# Patient Record
Sex: Female | Born: 1935 | Race: White | Hispanic: No | Smoking: Never smoker
Health system: Southern US, Community
[De-identification: ages and names within clinical notes are randomized; demographics above are authoritative.]

## PROBLEM LIST (undated history)

## (undated) DIAGNOSIS — N393 Stress incontinence (female) (male): Secondary | ICD-10-CM

## (undated) DIAGNOSIS — S7290XA Unspecified fracture of unspecified femur, initial encounter for closed fracture: Secondary | ICD-10-CM

## (undated) DIAGNOSIS — R112 Nausea with vomiting, unspecified: Secondary | ICD-10-CM

## (undated) DIAGNOSIS — E559 Vitamin D deficiency, unspecified: Secondary | ICD-10-CM

## (undated) DIAGNOSIS — K219 Gastro-esophageal reflux disease without esophagitis: Secondary | ICD-10-CM

## (undated) DIAGNOSIS — I4891 Unspecified atrial fibrillation: Secondary | ICD-10-CM

## (undated) DIAGNOSIS — Z9889 Other specified postprocedural states: Secondary | ICD-10-CM

## (undated) DIAGNOSIS — I890 Lymphedema, not elsewhere classified: Secondary | ICD-10-CM

## (undated) DIAGNOSIS — K5792 Diverticulitis of intestine, part unspecified, without perforation or abscess without bleeding: Secondary | ICD-10-CM

## (undated) DIAGNOSIS — I471 Supraventricular tachycardia: Secondary | ICD-10-CM

## (undated) DIAGNOSIS — I4719 Other supraventricular tachycardia: Secondary | ICD-10-CM

## (undated) DIAGNOSIS — I10 Essential (primary) hypertension: Secondary | ICD-10-CM

## (undated) DIAGNOSIS — M1990 Unspecified osteoarthritis, unspecified site: Secondary | ICD-10-CM

## (undated) DIAGNOSIS — C801 Malignant (primary) neoplasm, unspecified: Secondary | ICD-10-CM

## (undated) DIAGNOSIS — C7A090 Malignant carcinoid tumor of the bronchus and lung: Secondary | ICD-10-CM

## (undated) DIAGNOSIS — M810 Age-related osteoporosis without current pathological fracture: Secondary | ICD-10-CM

## (undated) DIAGNOSIS — K649 Unspecified hemorrhoids: Secondary | ICD-10-CM

## (undated) HISTORY — DX: Stress incontinence (female) (male): N39.3

## (undated) HISTORY — PX: TONSILLECTOMY: SUR1361

## (undated) HISTORY — DX: Other supraventricular tachycardia: I47.19

## (undated) HISTORY — DX: Supraventricular tachycardia: I47.1

## (undated) HISTORY — DX: Vitamin D deficiency, unspecified: E55.9

## (undated) HISTORY — PX: OTHER SURGICAL HISTORY: SHX169

## (undated) HISTORY — PX: APPENDECTOMY: SHX54

## (undated) HISTORY — DX: Diverticulitis of intestine, part unspecified, without perforation or abscess without bleeding: K57.92

## (undated) HISTORY — DX: Essential (primary) hypertension: I10

## (undated) HISTORY — PX: COLONOSCOPY W/ POLYPECTOMY: SHX1380

## (undated) HISTORY — DX: Unspecified osteoarthritis, unspecified site: M19.90

## (undated) HISTORY — PX: COLONOSCOPY: SHX174

## (undated) HISTORY — DX: Unspecified hemorrhoids: K64.9

## (undated) HISTORY — DX: Malignant (primary) neoplasm, unspecified: C80.1

## (undated) HISTORY — DX: Malignant carcinoid tumor of the bronchus and lung: C7A.090

## (undated) HISTORY — PX: JOINT REPLACEMENT: SHX530

## (undated) HISTORY — DX: Unspecified fracture of unspecified femur, initial encounter for closed fracture: S72.90XA

## (undated) HISTORY — PX: TOTAL HIP ARTHROPLASTY: SHX124

## (undated) HISTORY — PX: EYE SURGERY: SHX253

---

## 1968-10-15 HISTORY — PX: TUBAL LIGATION: SHX77

## 1975-10-16 HISTORY — PX: VAGINAL HYSTERECTOMY: SHX2639

## 1986-10-15 DIAGNOSIS — C801 Malignant (primary) neoplasm, unspecified: Secondary | ICD-10-CM

## 1986-10-15 HISTORY — DX: Malignant (primary) neoplasm, unspecified: C80.1

## 1986-10-15 HISTORY — PX: BUNIONECTOMY: SHX129

## 1987-04-05 HISTORY — PX: MASTECTOMY PARTIAL / LUMPECTOMY W/ AXILLARY LYMPHADENECTOMY: SUR852

## 1987-05-04 HISTORY — PX: BREAST BIOPSY: SHX20

## 1989-10-15 DIAGNOSIS — K649 Unspecified hemorrhoids: Secondary | ICD-10-CM

## 1989-10-15 HISTORY — DX: Unspecified hemorrhoids: K64.9

## 1994-10-15 DIAGNOSIS — M1990 Unspecified osteoarthritis, unspecified site: Secondary | ICD-10-CM

## 1994-10-15 HISTORY — DX: Unspecified osteoarthritis, unspecified site: M19.90

## 1997-10-15 DIAGNOSIS — K5792 Diverticulitis of intestine, part unspecified, without perforation or abscess without bleeding: Secondary | ICD-10-CM

## 1997-10-15 HISTORY — DX: Diverticulitis of intestine, part unspecified, without perforation or abscess without bleeding: K57.92

## 1998-05-12 ENCOUNTER — Ambulatory Visit (HOSPITAL_COMMUNITY): Admission: RE | Admit: 1998-05-12 | Discharge: 1998-05-12 | Payer: Self-pay | Admitting: Gastroenterology

## 1998-09-07 ENCOUNTER — Ambulatory Visit (HOSPITAL_COMMUNITY): Admission: RE | Admit: 1998-09-07 | Discharge: 1998-09-07 | Payer: Self-pay

## 1998-09-13 ENCOUNTER — Ambulatory Visit (HOSPITAL_COMMUNITY): Admission: RE | Admit: 1998-09-13 | Discharge: 1998-09-13 | Payer: Self-pay

## 1999-12-01 ENCOUNTER — Other Ambulatory Visit: Admission: RE | Admit: 1999-12-01 | Discharge: 1999-12-01 | Payer: Self-pay | Admitting: *Deleted

## 2001-02-06 ENCOUNTER — Encounter: Admission: RE | Admit: 2001-02-06 | Discharge: 2001-02-06 | Payer: Self-pay

## 2001-03-17 ENCOUNTER — Encounter: Payer: Self-pay | Admitting: Orthopedic Surgery

## 2001-03-19 ENCOUNTER — Inpatient Hospital Stay (HOSPITAL_COMMUNITY): Admission: RE | Admit: 2001-03-19 | Discharge: 2001-03-24 | Payer: Self-pay | Admitting: Orthopedic Surgery

## 2001-03-19 ENCOUNTER — Encounter: Payer: Self-pay | Admitting: Orthopedic Surgery

## 2002-03-05 ENCOUNTER — Encounter: Payer: Self-pay | Admitting: Oncology

## 2002-03-05 ENCOUNTER — Ambulatory Visit (HOSPITAL_COMMUNITY): Admission: RE | Admit: 2002-03-05 | Discharge: 2002-03-05 | Payer: Self-pay | Admitting: Oncology

## 2003-05-07 ENCOUNTER — Observation Stay (HOSPITAL_COMMUNITY): Admission: EM | Admit: 2003-05-07 | Discharge: 2003-05-09 | Payer: Self-pay | Admitting: *Deleted

## 2003-05-07 ENCOUNTER — Encounter: Payer: Self-pay | Admitting: *Deleted

## 2003-05-09 ENCOUNTER — Encounter: Payer: Self-pay | Admitting: *Deleted

## 2004-01-26 ENCOUNTER — Encounter: Admission: RE | Admit: 2004-01-26 | Discharge: 2004-01-26 | Payer: Self-pay | Admitting: Orthopedic Surgery

## 2004-09-06 ENCOUNTER — Ambulatory Visit: Payer: Self-pay | Admitting: Oncology

## 2005-01-12 ENCOUNTER — Ambulatory Visit: Payer: Self-pay | Admitting: Family Medicine

## 2005-01-12 ENCOUNTER — Ambulatory Visit: Payer: Self-pay | Admitting: Oncology

## 2005-08-18 ENCOUNTER — Encounter: Admission: RE | Admit: 2005-08-18 | Discharge: 2005-08-18 | Payer: Self-pay | Admitting: Orthopedic Surgery

## 2005-09-05 ENCOUNTER — Encounter: Admission: RE | Admit: 2005-09-05 | Discharge: 2005-09-05 | Payer: Self-pay | Admitting: Orthopedic Surgery

## 2005-09-20 ENCOUNTER — Encounter: Admission: RE | Admit: 2005-09-20 | Discharge: 2005-09-20 | Payer: Self-pay | Admitting: Orthopedic Surgery

## 2005-10-16 ENCOUNTER — Ambulatory Visit: Payer: Self-pay | Admitting: Family Medicine

## 2005-10-26 ENCOUNTER — Ambulatory Visit: Payer: Self-pay | Admitting: Family Medicine

## 2006-01-10 ENCOUNTER — Ambulatory Visit: Payer: Self-pay | Admitting: Family Medicine

## 2006-01-11 ENCOUNTER — Ambulatory Visit: Payer: Self-pay | Admitting: Oncology

## 2006-03-25 ENCOUNTER — Ambulatory Visit: Payer: Self-pay | Admitting: Family Medicine

## 2006-04-09 ENCOUNTER — Ambulatory Visit: Payer: Self-pay | Admitting: Family Medicine

## 2006-07-02 ENCOUNTER — Ambulatory Visit: Payer: Self-pay | Admitting: Family Medicine

## 2007-01-13 ENCOUNTER — Encounter: Admission: RE | Admit: 2007-01-13 | Discharge: 2007-01-13 | Payer: Self-pay | Admitting: General Surgery

## 2007-01-13 DIAGNOSIS — M949 Disorder of cartilage, unspecified: Secondary | ICD-10-CM

## 2007-01-13 DIAGNOSIS — M1990 Unspecified osteoarthritis, unspecified site: Secondary | ICD-10-CM | POA: Insufficient documentation

## 2007-01-13 DIAGNOSIS — M899 Disorder of bone, unspecified: Secondary | ICD-10-CM | POA: Insufficient documentation

## 2007-04-09 ENCOUNTER — Ambulatory Visit: Payer: Self-pay | Admitting: Family Medicine

## 2007-04-10 ENCOUNTER — Encounter: Payer: Self-pay | Admitting: Family Medicine

## 2007-04-10 LAB — CONVERTED CEMR LAB
ALT: 17 units/L (ref 0–35)
AST: 20 units/L (ref 0–37)
Albumin: 4.2 g/dL (ref 3.5–5.2)
Alkaline Phosphatase: 48 units/L (ref 39–117)
BUN: 10 mg/dL (ref 6–23)
Basophils Absolute: 0 10*3/uL (ref 0.0–0.1)
Basophils Relative: 0.3 % (ref 0.0–1.0)
Bilirubin, Direct: 0.1 mg/dL (ref 0.0–0.3)
CO2: 31 meq/L (ref 19–32)
Calcium: 9.8 mg/dL (ref 8.4–10.5)
Chloride: 99 meq/L (ref 96–112)
Cholesterol: 201 mg/dL (ref 0–200)
Creatinine, Ser: 0.8 mg/dL (ref 0.4–1.2)
Direct LDL: 107.8 mg/dL
Eosinophils Absolute: 0 10*3/uL (ref 0.0–0.6)
Eosinophils Relative: 0.5 % (ref 0.0–5.0)
GFR calc Af Amer: 91 mL/min
GFR calc non Af Amer: 75 mL/min
Glucose, Bld: 85 mg/dL (ref 70–99)
HCT: 40.5 % (ref 36.0–46.0)
HDL: 65.9 mg/dL (ref >39.0)
Hemoglobin: 13.9 g/dL (ref 12.0–15.0)
Lymphocytes Relative: 17.6 % (ref 12.0–46.0)
MCHC: 34.3 g/dL (ref 30.0–36.0)
MCV: 94.7 fL (ref 78.0–100.0)
Monocytes Absolute: 0.3 10*3/uL (ref 0.2–0.7)
Monocytes Relative: 8.6 % (ref 3.0–11.0)
Neutro Abs: 2.2 10*3/uL (ref 1.4–7.7)
Neutrophils Relative %: 73 % (ref 43.0–77.0)
Platelets: 184 10*3/uL (ref 150–400)
Potassium: 4.2 meq/L (ref 3.5–5.1)
RBC: 4.27 M/uL (ref 3.87–5.11)
RDW: 13.4 % (ref 11.5–14.6)
Sodium: 140 meq/L (ref 135–145)
TSH: 1.06 microintl units/mL (ref 0.35–5.50)
Total Bilirubin: 1.4 mg/dL — ABNORMAL HIGH (ref 0.3–1.2)
Total CHOL/HDL Ratio: 3.1
Total Protein: 7.1 g/dL (ref 6.0–8.3)
Triglycerides: 50 mg/dL (ref 0–149)
VLDL: 10 mg/dL (ref 0–40)
WBC: 3 10*3/uL — ABNORMAL LOW (ref 4.5–10.5)

## 2007-04-11 ENCOUNTER — Encounter (INDEPENDENT_AMBULATORY_CARE_PROVIDER_SITE_OTHER): Payer: Self-pay | Admitting: *Deleted

## 2007-04-23 ENCOUNTER — Ambulatory Visit: Payer: Self-pay | Admitting: Family Medicine

## 2007-05-19 ENCOUNTER — Encounter: Payer: Self-pay | Admitting: Family Medicine

## 2007-06-11 ENCOUNTER — Telehealth: Payer: Self-pay | Admitting: Family Medicine

## 2007-08-18 ENCOUNTER — Telehealth (INDEPENDENT_AMBULATORY_CARE_PROVIDER_SITE_OTHER): Payer: Self-pay | Admitting: *Deleted

## 2007-08-21 ENCOUNTER — Encounter: Admission: RE | Admit: 2007-08-21 | Discharge: 2007-08-21 | Payer: Self-pay | Admitting: Family Medicine

## 2007-12-29 ENCOUNTER — Encounter: Admission: RE | Admit: 2007-12-29 | Discharge: 2007-12-29 | Payer: Self-pay | Admitting: Orthopedic Surgery

## 2008-05-04 ENCOUNTER — Telehealth (INDEPENDENT_AMBULATORY_CARE_PROVIDER_SITE_OTHER): Payer: Self-pay | Admitting: *Deleted

## 2009-02-07 ENCOUNTER — Encounter: Admission: RE | Admit: 2009-02-07 | Discharge: 2009-04-14 | Payer: Self-pay | Admitting: Family Medicine

## 2010-03-20 ENCOUNTER — Encounter: Admission: RE | Admit: 2010-03-20 | Discharge: 2010-03-20 | Payer: Self-pay | Admitting: Surgery

## 2010-06-05 ENCOUNTER — Ambulatory Visit (HOSPITAL_COMMUNITY): Admission: RE | Admit: 2010-06-05 | Discharge: 2010-06-06 | Payer: Self-pay | Admitting: Plastic Surgery

## 2010-06-05 ENCOUNTER — Encounter (INDEPENDENT_AMBULATORY_CARE_PROVIDER_SITE_OTHER): Payer: Self-pay | Admitting: Plastic Surgery

## 2010-06-05 HISTORY — PX: BREAST IMPLANT REMOVAL: SHX5361

## 2010-10-15 HISTORY — PX: KNEE ARTHROPLASTY: SHX992

## 2010-10-20 ENCOUNTER — Encounter: Payer: Self-pay | Admitting: Cardiology

## 2010-10-20 ENCOUNTER — Ambulatory Visit: Payer: Self-pay | Admitting: Cardiology

## 2010-10-20 ENCOUNTER — Ambulatory Visit (HOSPITAL_COMMUNITY)
Admission: RE | Admit: 2010-10-20 | Discharge: 2010-10-20 | Payer: Self-pay | Source: Home / Self Care | Attending: Cardiology | Admitting: Cardiology

## 2010-10-20 ENCOUNTER — Encounter: Payer: Self-pay | Admitting: Internal Medicine

## 2010-10-20 ENCOUNTER — Ambulatory Visit: Admission: RE | Admit: 2010-10-20 | Discharge: 2010-10-20 | Payer: Self-pay | Source: Home / Self Care

## 2010-11-04 ENCOUNTER — Encounter: Payer: Self-pay | Admitting: Orthopedic Surgery

## 2010-11-08 LAB — URINALYSIS, ROUTINE W REFLEX MICROSCOPIC
Bilirubin Urine: NEGATIVE
Hgb urine dipstick: NEGATIVE
Ketones, ur: NEGATIVE mg/dL
Nitrite: NEGATIVE
Protein, ur: NEGATIVE mg/dL
Specific Gravity, Urine: 1.007 (ref 1.005–1.030)
Urine Glucose, Fasting: NEGATIVE mg/dL
Urobilinogen, UA: 0.2 mg/dL (ref 0.0–1.0)
pH: 6 (ref 5.0–8.0)

## 2010-11-08 LAB — COMPREHENSIVE METABOLIC PANEL
ALT: 33 U/L (ref 0–35)
AST: 29 U/L (ref 0–37)
Alkaline Phosphatase: 74 U/L (ref 39–117)
BUN: 9 mg/dL (ref 6–23)
CO2: 27 mEq/L (ref 19–32)
Calcium: 9.8 mg/dL (ref 8.4–10.5)
Creatinine, Ser: 0.78 mg/dL (ref 0.4–1.2)
GFR calc Af Amer: >60 mL/min (ref >60)
GFR calc non Af Amer: >60 mL/min (ref >60)
Glucose, Bld: 88 mg/dL (ref 70–99)
Potassium: 4.5 mEq/L (ref 3.5–5.1)
Sodium: 136 mEq/L (ref 135–145)
Total Bilirubin: 1.5 mg/dL — ABNORMAL HIGH (ref 0.3–1.2)
Total Protein: 7.2 g/dL (ref 6.0–8.3)

## 2010-11-08 LAB — CBC
HCT: 44.1 % (ref 36.0–46.0)
MCH: 30.5 pg (ref 26.0–34.0)
MCHC: 33.1 g/dL (ref 30.0–36.0)
RDW: 14.1 % (ref 11.5–15.5)
WBC: 4.7 10*3/uL (ref 4.0–10.5)

## 2010-11-08 LAB — SURGICAL PCR SCREEN: Staphylococcus aureus: NEGATIVE

## 2010-11-08 LAB — PROTIME-INR
INR: 1.09 (ref 0.00–1.49)
Prothrombin Time: 14.3 seconds (ref 11.6–15.2)

## 2010-11-13 ENCOUNTER — Inpatient Hospital Stay (HOSPITAL_COMMUNITY)
Admission: RE | Admit: 2010-11-13 | Discharge: 2010-11-16 | DRG: 470 | Disposition: A | Discharge status: Skilled Nursing Facility | Payer: Medicare Other | Attending: Orthopedic Surgery | Admitting: Orthopedic Surgery

## 2010-11-13 DIAGNOSIS — Z79899 Other long term (current) drug therapy: Secondary | ICD-10-CM

## 2010-11-13 DIAGNOSIS — D62 Acute posthemorrhagic anemia: Secondary | ICD-10-CM | POA: Diagnosis not present

## 2010-11-13 DIAGNOSIS — M1710 Unilateral primary osteoarthritis, unspecified knee: Principal | ICD-10-CM | POA: Diagnosis present

## 2010-11-13 DIAGNOSIS — E871 Hypo-osmolality and hyponatremia: Secondary | ICD-10-CM | POA: Diagnosis not present

## 2010-11-13 DIAGNOSIS — Z7982 Long term (current) use of aspirin: Secondary | ICD-10-CM

## 2010-11-13 DIAGNOSIS — I4891 Unspecified atrial fibrillation: Secondary | ICD-10-CM | POA: Diagnosis present

## 2010-11-13 DIAGNOSIS — I10 Essential (primary) hypertension: Secondary | ICD-10-CM | POA: Diagnosis present

## 2010-11-13 DIAGNOSIS — Z96649 Presence of unspecified artificial hip joint: Secondary | ICD-10-CM

## 2010-11-13 DIAGNOSIS — M899 Disorder of bone, unspecified: Secondary | ICD-10-CM | POA: Diagnosis present

## 2010-11-13 DIAGNOSIS — R110 Nausea: Secondary | ICD-10-CM | POA: Diagnosis not present

## 2010-11-13 DIAGNOSIS — K219 Gastro-esophageal reflux disease without esophagitis: Secondary | ICD-10-CM | POA: Diagnosis present

## 2010-11-13 LAB — TYPE AND SCREEN
ABO/RH(D): A POS
Antibody Screen: NEGATIVE

## 2010-11-14 LAB — BASIC METABOLIC PANEL
BUN: 9 mg/dL (ref 6–23)
CO2: 28 mEq/L (ref 19–32)
Calcium: 8.6 mg/dL (ref 8.4–10.5)
Chloride: 102 mEq/L (ref 96–112)
Creatinine, Ser: 0.65 mg/dL (ref 0.4–1.2)
GFR calc Af Amer: >60 mL/min (ref >60)
GFR calc non Af Amer: >60 mL/min (ref >60)
Glucose, Bld: 147 mg/dL — ABNORMAL HIGH (ref 70–99)
Sodium: 137 mEq/L (ref 135–145)

## 2010-11-14 LAB — CBC
HCT: 32 % — ABNORMAL LOW (ref 36.0–46.0)
Hemoglobin: 10.5 g/dL — ABNORMAL LOW (ref 12.0–15.0)
MCHC: 32.8 g/dL (ref 30.0–36.0)
MCV: 92.8 fL (ref 78.0–100.0)
Platelets: 127 10*3/uL — ABNORMAL LOW (ref 150–400)
RBC: 3.45 MIL/uL — ABNORMAL LOW (ref 3.87–5.11)
RDW: 14 % (ref 11.5–15.5)
WBC: 8.8 10*3/uL (ref 4.0–10.5)

## 2010-11-14 LAB — PROTIME-INR
INR: 1.08 (ref 0.00–1.49)
Prothrombin Time: 14.2 seconds (ref 11.6–15.2)

## 2010-11-15 LAB — BASIC METABOLIC PANEL
BUN: 7 mg/dL (ref 6–23)
Calcium: 8.3 mg/dL — ABNORMAL LOW (ref 8.4–10.5)
GFR calc non Af Amer: >60 mL/min (ref >60)
Glucose, Bld: 125 mg/dL — ABNORMAL HIGH (ref 70–99)

## 2010-11-15 LAB — CBC
MCH: 30.4 pg (ref 26.0–34.0)
MCHC: 33.2 g/dL (ref 30.0–36.0)
Platelets: 108 10*3/uL — ABNORMAL LOW (ref 150–400)
RBC: 3.16 MIL/uL — ABNORMAL LOW (ref 3.87–5.11)

## 2010-11-15 LAB — PROTIME-INR
INR: 2.02 — ABNORMAL HIGH (ref 0.00–1.49)
Prothrombin Time: 23 seconds — ABNORMAL HIGH (ref 11.6–15.2)

## 2010-11-15 NOTE — Op Note (Signed)
[name], [name]                 ACCOUNT NO.:  [id number]  MEDICAL RECORD NO.:  [id number]          PATIENT TYPE:  INP  LOCATION:  0005                         FACILITY:  [facility]  PHYSICIAN:  Ollen Gross, M.D.    DATE OF BIRTH:  August 27, 1934  DATE OF PROCEDURE:  11/13/2010 DATE OF DISCHARGE:                              OPERATIVE REPORT   PREOPERATIVE DIAGNOSIS:  Osteoarthritis of the right knee.  POSTOPERATIVE DIAGNOSIS:  Osteoarthritis of the right knee.  PROCEDURE:  Right total-knee arthroplasty.  SURGEON:  Ollen Gross, M.D.  ASSISTANT:  Alexzandrew L. Perkins, P.A.C.  ANESTHESIA:  Spinal.  ESTIMATED BLOOD LOSS:  Minimal.  DRAINS:  Hemovac x1.  TOURNIQUET TIME:  34 minutes at 300 mmHg.  COMPLICATIONS:  None.  CONDITION:  Stable to recovery.  BRIEF CLINICAL NOTE:  Ms. Brissette is a 77 year old female with end-stage arthritis of the right knee with progressively worsening pain and dysfunction.  She has failed nonoperative management and presents now for right total-knee arthroplasty.  PROCEDURE IN DETAIL:  After successful administration of spinal anesthetic, a tourniquet was placed high on her right thigh and her right lower extremity was prepped and draped in the usual sterile fashion.  Extremities wrapped in Esmarch, knee flexed, tourniquet inflated to 300 mmHg.  Midline incision was made with a 10 blade through the subcutaneous tissue to the level of the extensor mechanism.  A fresh blade was used to make a medial parapatellar arthrotomy.  Soft tissue on the proximal medial tibia subperiosteally elevated to the joint line with the knife and into the semimembranosus bursa with the Cobb elevator.  Soft tissue laterally was elevated with attention being paid to avoiding the patellar tendon on the tibial tubercle.  The patella was everted, knee flexed 90 degrees and ACL and PCL were removed.  The drill was used to create a starting hole in the distal femur and the  canal was thoroughly irrigated.  The 5 degrees right valgus alignment guide was placed.  The distal femoral cutting block was pinned to remove 11 mm off the distal femur.  Distal femoral resection was made with an oscillating saw.  The tibia subluxed forward and the menisci were removed.  Extramedullary tibial alignment guide was placed referencing proximally to medial aspect of the tibial tubercle and distally along the second metatarsal axis and tibial crest.  The block was pinned to remove 2 mm off the more deficient medial side.  Tibial resection was made with an oscillating saw.  Size 2.5 was the most appropriate tibial component and the proximal tibia was prepared with the modular drill and keel punch for the size 2.5.  The femoral sizing guide was placed and the size 2.5 femur was most appropriate.  Rotation was marked off the epicondylar axis and the rotation was confirmed by creating rectangular flexion gap at 90 degrees.  The 2.5 cutting block was pinned in this rotation and the anterior, posterior and chamfer cuts were made.  Intercondylar block was placed and that cut was made.  Trial size 2.5 posterior stabilized femoral trial was placed.  A 10-mm posterior stabilized rotating platform  insert trial was then placed.  With the 10, full extension was achieved with excellent varus-valgus and anterior-posterior balance throughout full range of motion.  The patella was everted, thickness measured to be 15 mm.  Freehand resection was taken to 10 mm.  Please note that the patella was grossly worn and grooved.  We got down to the bottom of the groove which was at 10 mm thickness.  A 35 was the most appropriate patella, so a 35 template was placed, lug holes were drilled, trial patella was placed and it tracks normally.  Osteophytes were removed off the posterior femur with the trial in place and all trials were removed and the cut bone surfaces were prepared with pulsatile lavage.   Cement was mixed and once ready for implantation the size 2.5 mobile bearing tibial tray, 2.5 posterior stabilized femur and 35 patella were cemented into place and the patella was held with a clamp.  Trial 10-mm insert was placed, knee held in full extension and all extruded cement was removed.  When the cement was fully hardened, then the permanent 10-mm posterior stabilized rotating platform insert was placed in the tibial tray.  The wound was copiously irrigated with saline solution and the arthrotomy closed over the Hemovac drain with interrupted #1 PDS.  Flexion against gravity was about 120 degrees and the patella tracks normally.  The tourniquet was released for a total time of 34 minutes.  Subcutaneous was closed with interrupted 2-0 Vicryl and subcuticular running 4-0 Monocryl.  The catheter for the Marcaine pain pump was placed and the pump was initiated.  Hemovac drain was hooked to suction.  The incision was cleaned and dried and Steri-Strips and a bulky sterile dressing were applied.  She was then placed into a knee immobilizer, awakened and transferred to recovery in stable condition.     Ollen Gross, M.D.     FA/MEDQ  D:  11/13/2010  T:  11/13/2010  Job:  [id number]  Electronically Signed by Ollen Gross M.D. on 11/15/2010 03:40:24 PM

## 2010-11-16 LAB — BASIC METABOLIC PANEL
BUN: 8 mg/dL (ref 6–23)
Calcium: 8.1 mg/dL — ABNORMAL LOW (ref 8.4–10.5)
Creatinine, Ser: 0.65 mg/dL (ref 0.4–1.2)
GFR calc non Af Amer: >60 mL/min (ref >60)

## 2010-11-16 LAB — CBC
MCHC: 32.8 g/dL (ref 30.0–36.0)
Platelets: 105 10*3/uL — ABNORMAL LOW (ref 150–400)
RDW: 14.4 % (ref 11.5–15.5)
WBC: 4.8 10*3/uL (ref 4.0–10.5)

## 2010-11-16 LAB — PROTIME-INR
INR: 1.89 — ABNORMAL HIGH (ref 0.00–1.49)
Prothrombin Time: 21.9 seconds — ABNORMAL HIGH (ref 11.6–15.2)

## 2010-11-29 NOTE — Discharge Summary (Signed)
NAMEPRIYAL, [name]                 ACCOUNT NO.:  [id number]  MEDICAL RECORD NO.:  [id number]          PATIENT TYPE:  INP  LOCATION:  1616                         FACILITY:  [facility]  PHYSICIAN:  Ollen Gross, M.D.    DATE OF BIRTH:  May 23, 1934  DATE OF ADMISSION:  11/13/2010 DATE OF DISCHARGE:  11/16/2010                        DISCHARGE SUMMARY - REFERRING   ADMITTING DIAGNOSES: 1. Osteoarthritis of the right knee. 2. History of cataracts. 3. Hypertension. 4. History of atrial fibrillation. 5. Reflux disease. 6. Hemorrhoids. 7. Right-sided breast cancer. 8. Osteopenia. 9. Osteoarthritis.  DISCHARGE DIAGNOSES: 1. Osteoarthritis of the right knee status post right total-knee     replacement arthroplasty. 2. Postoperative acute blood loss anemia, did not require transfusion. 3. Postoperative hyponatremia, improved. 4. History of cataracts. 5. Hypertension. 6. History of atrial fibrillation. 7. Reflux disease. 8. Hemorrhoids. 9. Right-sided breast cancer. 10.Osteopenia. 11.Osteoarthritis.  PROCEDURE:  November 13, 2010, right total-knee.  SURGEON:  Ollen Gross, M.D.  ASSISTANT:  Alexzandrew L. Perkins, P.A.C.  ANESTHESIA:  Spinal anesthesia.  TOURNIQUET TIME:  34 minutes.  CONSULTATIONS:  None.  BRIEF HISTORY:  The patient is a 77 year old female with end-stage arthritis of the right knee with progressive worsening pain and dysfunction who has failed nonoperative management and now presents for a total-knee arthroplasty.  LABORATORY DATA:  Preoperative CBC showed a hemoglobin of 14.6, hematocrit of 44.1, white cell count 4.7, platelets 162.  PT/INR is 14.3 and 1.09 with a PTT of 35.  Chemistry panel on admission slightly elevated total bilirubin of 1.5.  Remaining Chemistry panel all within normal limits.  Preoperative UA negative.  Blood group type A positive. Nasal swabs were negative for Staphylococcus aureus and negative for MRSA.  Serial CBCs were  followed.  Hemoglobin dropped from 10.5 to 9.6. Last noted H and H transfer was 8.5 and 25.9.  Serial prothrombin times were followed per Coumadin protocol.  Last noted PT/INR 21.9 and 1.89. Serial BMETs were followed for 3 days.  Sodium did drop from 136 to 129 and back up to 133.  Remaining electrolytes remained within normal limits.  Glucose went up from 88 to 147 and back down to 91.  RADIOLOGIC:  EKG October 19, 2010, atrial fibrillation, confirmed by Dr. Peter Swaziland.  HOSPITAL COURSE:  The patient was admitted to [facility], taken to the OR and underwent the above-stated procedure without complication.  The patient tolerated the procedure well and was later transferred to the recovery room on the orthopedic floor.  She did have some intermittent nausea on the evening of surgery and on the morning of day one.  She started back on her home medications.  We made sure that she stayed on her Bystolic beta-blockers.  Her other blood pressure medications were put on parameters.  She did have some low pressure with a systolic of 90 on the morning of day one so we gave her some extra fluids to support this.  Her rate was controlled in the 70s though. Otherwise she was doing very well.  Started back on all of her home medications.  She started getting up out of bed  and on day one she actually walked over 40 feet.  Doing very well.  She did want to look into a skilled facility so we got Social Work involved right away.  By day two, she was walking over 170 feet.  We are looking into [facility].  She was doing well.  Her pulse remained stable and she was in the 60s and 70s throughout the entire hospital course so she was very well rate controlled.  Blood pressure was much improved.  The pressure was already back up in the 120s.  Her dressing was changed on that day and the incision looked good.  No signs of infection.  Her sodium was down to 129 though.  We felt it was due to a  dilutional component with the extra fluids that she required on day one so we discontinued the fluids.  Her pressure was stable when we rechecked that the following day.  She continued to progress well with physical therapy and on postoperative day three her sodium was already back up to 133.  Her pulse was stable in the 70s.  Blood pressure was stable and it was decided that she would be transferred over to [facility] at that time.  DISCHARGE PLAN: 1. The patient will be transferred to [facility]. 2. Discharge diagnosis:  Please see above.  DISCHARGE MEDICATIONS:  Current medications at the time of transfer include: 1. Coumadin protocol.  Please titrate the Coumadin level for a target     INR between 2.0 and 3.0.  She needs to be on Coumadin for 3 weeks     for DVT prophylaxis. 2. Colace 100 mg p.o. b.i.d. 3. Protonix 40 mg p.o. q.a.m. 4. Aspirin 325 mg p.o. daily. 5. Amlodipine 5 mg p.o. q.a.m. 6. Micardis 80 mg p.o. q.a.m. 7. Bystolic 10 mg p.o. q.a.m. 8. Tylenol 325 one or two every 4 to 6 hours as needed for mild pain. 9. Laxative of choice. 10.Enema of choice. 11.Robaxin 500 mg p.o. q.6-8 hours p.r.n. spasm.12.Percocet 5 mg one or two every 4 to 6 hours as needed for moderate     pain. 13.Claritin 10 mg p.o. daily p.r.n. 14.Artificial tears one drop in eyes twice daily as needed.  DIET:  Heart-healthy diet.  ACTIVITY:  She is weightbearing as tolerated to the right lower extremity.  Total-knee protocol.  PT and OT for gait training, ambulation, ADLs, range of motion and strengthening exercises.  Please note, she may start showering after transfer to [facility].  Daily dressing change to the wound.  Do not submerge the incision under water.  FOLLOWUP:  The patient is to follow up with Dr. Lequita [name] in the office on February 14 on Tuesday or February 16 on Thursday.  Please contact the office at [phone] to make an appointment for followup care.  DISPOSITION:   [facility].  CONDITION ON DISCHARGE:  Improving at this time.     Alexzandrew L. Julien Girt, P.A.C.   ______________________________ Ollen Gross, M.D.    ALP/MEDQ  D:  11/16/2010  T:  11/16/2010  Job:  [id number]  cc:   Kari Baars, M.D. Fax: [phone]  Peter M. Swaziland, M.D. Fax: [phone]  Electronically Signed by Patrica Duel P.A.C. on 11/16/2010 10:50:40 AM Electronically Signed by Ollen Gross M.D. on 11/29/2010 02:52:10 PM

## 2010-12-26 ENCOUNTER — Ambulatory Visit: Payer: Medicare Other | Attending: Orthopedic Surgery | Admitting: Physical Therapy

## 2010-12-26 DIAGNOSIS — M25569 Pain in unspecified knee: Secondary | ICD-10-CM | POA: Insufficient documentation

## 2010-12-26 DIAGNOSIS — IMO0001 Reserved for inherently not codable concepts without codable children: Secondary | ICD-10-CM | POA: Insufficient documentation

## 2010-12-26 DIAGNOSIS — M25669 Stiffness of unspecified knee, not elsewhere classified: Secondary | ICD-10-CM | POA: Insufficient documentation

## 2010-12-29 ENCOUNTER — Ambulatory Visit: Payer: Medicare Other | Admitting: Physical Therapy

## 2010-12-29 LAB — CBC
MCH: 30.9 pg (ref 26.0–34.0)
Platelets: 146 10*3/uL — ABNORMAL LOW (ref 150–400)
RBC: 4.46 MIL/uL (ref 3.87–5.11)
WBC: 4.1 10*3/uL (ref 4.0–10.5)

## 2010-12-29 LAB — BASIC METABOLIC PANEL
CO2: 27 mEq/L (ref 19–32)
Calcium: 9.8 mg/dL (ref 8.4–10.5)
Creatinine, Ser: 0.75 mg/dL (ref 0.4–1.2)
GFR calc Af Amer: >60 mL/min (ref >60)
GFR calc non Af Amer: >60 mL/min (ref >60)

## 2010-12-29 LAB — SURGICAL PCR SCREEN: Staphylococcus aureus: NEGATIVE

## 2011-01-01 ENCOUNTER — Ambulatory Visit: Payer: Medicare Other | Admitting: Rehabilitation

## 2011-01-04 ENCOUNTER — Ambulatory Visit: Payer: Medicare Other | Admitting: Physical Therapy

## 2011-01-08 ENCOUNTER — Encounter: Payer: Medicare Other | Admitting: Physical Therapy

## 2011-01-08 NOTE — H&P (Signed)
[name], [name]                 ACCOUNT NO.:  [id number]  MEDICAL RECORD NO.:  [id number]           PATIENT TYPE:  LOCATION:                                 FACILITY:  PHYSICIAN:  Ollen Gross, M.D.    DATE OF BIRTH:  22-Jul-1934  DATE OF ADMISSION: DATE OF DISCHARGE:                             HISTORY & PHYSICAL   DATE OF OFFICE VISIT:  October 24, 2010.  DATE OF ADMISSION:  November 13, 2010.  CHIEF COMPLAINT:  Right knee pain.  HISTORY OF PRESENT ILLNESS:  The patient is a 77 year old female who has been seen by Dr. Lequita Halt for evaluation of bilateral knee pain.  The right knee has been more problematic than the left.  She has had pain progressively getting worse over quite some time now.  She is at the stage now where she is hurting all of the time and would like to have something done about it.  She has been seen in the office by Dr. Lequita Halt and found to have knees with lateral endstage arthritic changes and also median patellofemoral changes.  The right knee is slightly worse than the left, but both are endstage.  It is felt she would benefit from undergoing surgical intervention.  Risks and benefits have been discussed.  She would like to proceed with surgery.  She has been seen preoperatively by Dr. Clelia Croft and felt to be stable for surgery.  ALLERGIES:  No known drug allergies.  CURRENT MEDICATIONS: 1. Bystolic 10 mg daily. 2. Micardis 80 mg daily. 3. Amlodipine 5 mg daily. 4. Aspirin 325 mg daily. 5. Vagifem 10 mg twice a week. 6. ICAPS twice a day. 7. Protonix 40 mg daily. 8. Citracal 600 mg twice a day. 9. Vitamin D 400 mg daily. 10.One-A-Day multivitamin. 11.Fish oil 1200 mg daily.  PAST MEDICAL HISTORY: 1. Cataracts. 2. Hypertension. 3. Atrial fibrillation. 4. Reflux disease. 5. Hemorrhoids. 6. Right-sided breast cancer. 7. Osteopenia. 8. Osteoarthritis.  PAST SURGICAL HISTORY: 1. Partial hysterectomy in 1977. 2. Right-sided breast cancer  surgery in 1988 followed by chemo. 3. Left total hip replacement in 2002. 4. Right total hip replacement in 2009 done at [facility]. 5. Right implant removal in 2011.  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  FAMILY HISTORY:  Father with colon cancer and also mother with colon cancer.  SOCIAL HISTORY:  Widow.  Retired.  Nonsmoker.  No alcohol.  Lives alone. She does want to look into [facility] following her hospital stay. She does have a living will, healthcare power of attorney.  REVIEW OF SYSTEMS:  GENERAL:  No fevers, chills or night sweats. NEUROLOGIC:  No seizures, syncope or paralysis.  RESPIRATORY:  No shortness of breath, productive cough or hemoptysis.  CARDIOVASCULAR: She has had some nocturnal palpitations.  This was associated with atrial fibrillation.  She has never had any during the day when she is standing upright.  GASTROINTESTINAL:  Constipation.  No nausea, vomiting or diarrhea.  GENITOURINARY:  A little bit of frequency, weak stream and nocturia.  No dysuria or hematuria.  MUSCULOSKELETAL:  Knee pain.  PHYSICAL EXAMINATION:  VITAL SIGNS:  Pulse 72 and irregular, respiratory rate is 12, blood pressure is 158/82. GENERAL APPEARANCE:  A 77 year old white female, well-nourished well- developed in no acute distress.  She is alert, oriented, cooperative and pleasant. HEENT:  Normocephalic, atraumatic.  Pupils are round and reactive.  EOMs intact.  She does have contacts.  (She will bring her glasses to the hospital). NECK:  Supple. CHEST:  Clear. HEART:  Irregular rhythm.  No murmur appreciated. ABDOMEN:  Soft, nontender.  Bowel sounds are present. RECTAL/BREASTS/GENITALIA:  Not done. EXTREMITIES:  Right knee range of motion was 10 to 120, marked crepitus.  IMPRESSION:  Osteoarthritic right knee.  PLAN:  The patient was admitted to [facility] to undergo a right total knee replacement arthroplasty.  Surgery will  be performed by Dr. Ollen Gross.  She does want to look into [facility] postoperatively.     [name], P.A.C.   ______________________________ Ollen Gross, M.D.    ALP/MEDQ  D:  11/12/2010  T:  11/12/2010  Job:  [id number]  cc:   Kari Baars, M.D. Fax: [phone]  Peter M. Swaziland, M.D. Fax: [phone]  Electronically Signed by Patrica Duel P.A.C. on 01/05/2011 10:27:09 AM Electronically Signed by Ollen Gross M.D. on 01/08/2011 07:10:14 AM

## 2011-01-09 ENCOUNTER — Ambulatory Visit: Payer: Medicare Other | Attending: Orthopedic Surgery | Admitting: Physical Therapy

## 2011-01-09 DIAGNOSIS — IMO0001 Reserved for inherently not codable concepts without codable children: Secondary | ICD-10-CM | POA: Insufficient documentation

## 2011-01-09 DIAGNOSIS — M25669 Stiffness of unspecified knee, not elsewhere classified: Secondary | ICD-10-CM | POA: Insufficient documentation

## 2011-01-09 DIAGNOSIS — M25569 Pain in unspecified knee: Secondary | ICD-10-CM | POA: Insufficient documentation

## 2011-01-11 ENCOUNTER — Ambulatory Visit: Payer: Medicare Other | Admitting: Physical Therapy

## 2011-01-15 ENCOUNTER — Ambulatory Visit: Payer: Medicare Other | Attending: Orthopedic Surgery | Admitting: Physical Therapy

## 2011-01-15 DIAGNOSIS — M25669 Stiffness of unspecified knee, not elsewhere classified: Secondary | ICD-10-CM | POA: Insufficient documentation

## 2011-01-15 DIAGNOSIS — M25569 Pain in unspecified knee: Secondary | ICD-10-CM | POA: Insufficient documentation

## 2011-01-15 DIAGNOSIS — IMO0001 Reserved for inherently not codable concepts without codable children: Secondary | ICD-10-CM | POA: Insufficient documentation

## 2011-01-17 ENCOUNTER — Ambulatory Visit: Payer: Medicare Other | Admitting: Physical Therapy

## 2011-02-27 ENCOUNTER — Encounter (INDEPENDENT_AMBULATORY_CARE_PROVIDER_SITE_OTHER): Payer: Self-pay | Admitting: Surgery

## 2011-03-02 NOTE — Discharge Summary (Signed)
NAMETHEODORE, RAHRIG                           ACCOUNT NO.:  [id number]   MEDICAL RECORD NO.:  [id number]                   PATIENT TYPE:  OBV   LOCATION:  3741                                 FACILITY:  [facility]   PHYSICIAN:  [place] Bing, M.D.               DATE OF BIRTH:  Feb 01, 1934   DATE OF ADMISSION:  05/07/2003  DATE OF DISCHARGE:  05/08/2003                           DISCHARGE SUMMARY - REFERRING   BRIEF HISTORY:  This is a 77 year old female with no history of coronary  artery disease although she does have a history of an irregular heart rate  and palpitations which resolved in the past with decreased caffeine intake.  She was admitted to [facility] on May 07, 2003 with chest pain  which was felt to be somewhat atypical; however, it was felt best to bring  her in and further evaluate her symptoms.   PAST MEDICAL HISTORY:  The patient had a 2-D echocardiogram in the past  secondary to palpitations which was reportedly okay. She does have a history  of hypertension, degenerative joint disease.  History of breast CA status post surgery and chemotherapy.  History of diverticulosis.   ALLERGIES:  No known drug allergies.   SOCIAL HISTORY:  The patient lives in [place] with her son and his  family. She is widowed. She does not use alcohol or tobacco.   FAMILY HISTORY:  Her mother died at age 62 from colon CA. Her father died at  age 23 from colon CA.   HOSPITAL COURSE:  As noted, this patient was admitted to [facility]  for further evaluation of chest pain. She was noted to be hypokalemic on  admission with a potassium of 3.1. This was supplemented and improved. She  had a mildly low white blood cell count of 3.7, and she had mildly abnormal  cardiac enzymes. Decision was made to proceed with an exercise Cardiolite.  This was performed on May 08, 2003. The Cardiolite was read as negative for  ischemia with an ejection fraction of 68%.  Arrangements were made to  discharge the patient home later that day.   LABORATORY DATA:  Chemistry profile on the day of discharge revealed BUN 12,  creatinine 0.7, potassium 4.9, sodium was mildly low at 131. CK was 176 with  a MB of 4.1, index 2.3, troponin was 0.01. Another CK was 191 with a MB of  5.2, index 2.7, troponin 0.01. Another troponin was less than 0.05. PTT was  32, INR 0.9. On admission, potassium was 3.1, sodium 126, chloride 94. Total  bilirubin was 1.3. A CBC revealed hemoglobin of 12.1, hematocrit 34.6, WBCs  3.7 thousand, platelets were 176,000. Chest x-ray showed COPD with no acute  abnormality.   DISCHARGE MEDICATIONS:  1. Aspirin 81 mg daily.  2. Oxytol patch to be changed very Wednesday and Sunday.  3. Triamterene/hydrochlorothiazide 37.5/25 mg daily.  4.  Citrucel.  5. Vitamin E.  6. Vitamin C.  7. Multivitamin.  8. Calcium with vitamin D.   DIET:  The patient was told to stay on a low salt, low fat diet.   FOLLOW UP:  She was to follow up with Dr. Dietrich Pates only as needed. She was  told to call her primary care physician, Dr. Ruthine Dose, for an appointment.   PROBLEM LIST AT TIME OF DISCHARGE:  1. Chest pain with negative troponin enzymes.  2. Hypokalemia supplemented.  3. Exercise Cardiolite performed May 08, 2003 negative for ischemia,     ejection fraction 68%.  4. History of hypertension.  5. History of breast cancer.  6. History of palpitations.   It should be noted that the patient had no ectopy on telemetry monitoring;  however, during her exercise Cardiolite, she did have occasional PACs.   It might be beneficial to place the patient on a scheduled potassium  supplement.      Delton See, P.A. [facility]                  Glacier Bing, M.D.    DR/MEDQ  D:  05/09/2003  T:  05/09/2003  Job:  [id number]   cc:   Angelena Sole, M.D. [facility]

## 2011-03-02 NOTE — Op Note (Signed)
[place]. [facility]  Patient:    [name], [name]                        MRN: [id number] Proc. Date: 03/19/01 Adm. Date:  [id number] Attending:  Twana First                           Operative Report  PREOPERATIVE DIAGNOSIS:  Left hip degenerative joint disease.  POSTOPERATIVE DIAGNOSIS:  Left hip degenerative joint disease.  PROCEDURE:  Left total knee replacement using Osteonics system with 56 mm acetabular PSL Press-Fit cup with three locking screws and 10 degree polyethylene liner.  Femoral component #8 Secure-Fit Plus with -3 x 28 mm femoral head and neck.  SURGEON:  Elana Alm. Thurston Hole, M.D.  ASSISTANT:  Julien Girt, P.A.  ANESTHESIA:  Spinal.  OPERATIVE TIME:  One hour 40 minutes.  COMPLICATIONS:  None.  ESTIMATED BLOOD LOSS:  300 cc.  DESCRIPTION OF PROCEDURE:  Ms. Ascher was brought to the operating room on March 19, 2001, placed on the operative table in a supine position.  After an adequate level of spinal anesthetic was placed by Dr. Michelle Piper of anesthesia, she was then turned back supine.  She had Foley catheter placed under sterile conditions and received Ancef 1 g IV preoperatively for prophylaxis.  Her left hip was examined under anesthesia.  She had flexion to 90, extension to 0, internal and external rotation of 20 degrees.  Leg lengths were approximately equal.  Pulses 2+ and symmetric.  She was then turned to the left lateral up decubitus position and secured on the bed with a ______ frame.  Her left hip and leg were prepped using sterile Betadine and draped using sterile technique.  Originally through a 25 cm posterolateral greater trochanter incision initial exposure was made.  The underlying subcutaneous tissues were incised in line with the skin incision.  The iliotibial vein and gluteus maximus fascia was incised longitudinally revealing the underlying sciatic nerve which was carefully protected.  Short external  rotators of the hip were released off of the femoral neck insertions and tagged, along with the hip capsule.  Hip was then posteriorly dislocated.  Found to have significant grade 3 and 4 DJD of the femoral head.  A femoral neck cut was made 2 cm above the lesser trochanter in the appropriate amount of anteversion and inclination.  Retractors were carefully placed around the acetabulum and then the degenerative acetabular labrum was removed.  Grade 3 and 4 changes also noted around the acetabulum.  Sequential acetabular reamers were then used to ream up to a 56 mm size and then the 56 mm trial was placed, found to be an excellent fit.  It was removed and the actual 56 mm PSL cup was placed in the appropriate amount of anteversion and abduction and hammered into position. It was further secured there with three locking screws - two 20 mm and one 16 mm in the 11, 12, and 1 oclock positions.  After this was done, a 10 degree polyethylene liner was placed with the 10 degree lip in the posterolateral position.  The femur was then exposed.  Sequential axial reamers were used to ream up to a #8 size followed by broaches up to a #8 size, and with a #8 broach in place with a +0 x 28 mm femoral head and neck trial the hip was reduced, taken through a  full range of motion, found to be stable, leg lengths equal.  This hip trial was then dislocated posteriorly and the femoral trial removed.  Distal reaming was carried out to a 12.5 mm size. At this point then the #8 Secure-Fit Plus prosthesis was hammered into position with an excellent fit and with +0 trial head placed the hip was taken through a range of motion.  There was a slight degree of lengthening noted, so a -3 head trial was also tried, and this was found to be excellent, given excellent stability and a full range of motion.  It was removed and the actual -3 x 28 mm femoral head and neck was then placed and hammered into position. The hip was  then reduced, taken through a full range of motion, stable up to 75 degrees of internal rotation in both neutral and 30 degrees of adduction and stable in abduction and external rotation.  At this point it was felt that all the components were of excellent size, fit, and stability; leg lengths were equal.  The wound was copiously irrigated then the short external rotators of the hip and hip capsule were reattached to the femoral neck insertion through two drill holes in the greater tuberosity.  Iliotibial band and gluteus maximus fascia were closed with #1 Ethibond and Panacryl sutures, subcutaneous tissues were closed with 0 and 2-0 Vicryl, skin closed with skin staples.  Sterile dressings were applied, a hip abduction pillow placed.  The patient was turned supine and taken to the recovery room in stable condition. Needle and sponge counts correct x 2 at the end of the case. DD:  03/19/01 TD:  03/19/01 Job: [id number]

## 2011-03-02 NOTE — H&P (Signed)
NAME:  [name], [name]                           ACCOUNT NO.:  [id number]   MEDICAL RECORD NO.:  [id number]                   PATIENT TYPE:  EMS   LOCATION:  [facility]                                 FACILITY:  [facility]   PHYSICIAN:  Louin Bing, M.D.               DATE OF BIRTH:  [birth year]   DATE OF ADMISSION:  05/07/2003  DATE OF DISCHARGE:                                HISTORY & PHYSICAL   REFERRING PHYSICIAN:  Angelena Sole, M.D.   HISTORY OF PRESENT ILLNESS:  A 77 year old woman without known coronary  disease presenting with chest pressure.  [name] has been seen by Dr.  Lucas Mallow in the past for palpitations.  He felt that this was a benign  arrhythmia induced by caffeine.  With a decrease in her coffee consumption,  her symptoms resolved.  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   Over the past week or so, she has experienced a number of episodes of mild  to moderate lower substernal pressure.  The onset has been at rest.  There  has been relief with standing and moving about.  Typical episode lasts 15  minutes.  There has been no radiation.  There may be mild associated dyspnea  but no diaphoresis or nausea.  She has not had other GI symptoms nor GI  disease.  There has been no change in her bowel habits, appetite, or dietary  makeup.  There may be a minor pleuritic component to the discomfort.   [name] has a history of hypertension that has been well treated.  She  has ever been told of diabetes, hyperlipidemia, nor other cardiovascular  risk factors.  She had an echocardiogram in conjunction with her workup for  palpitations.  The results were apparently unremarkable.   PAST MEDICAL HISTORY:  Otherwise notable for DJD.  She has been told of  diverticulosis but has never had acute diverticulitis.  She underwent a  right mastectomy for carcinoma of the breast and received  adjuvant  chemotherapy .  She has had previous left total hip arthroplasty,  hysterectomy, orthopedic surgery on a foot, bilateral tubal ligation, and  appendectomy.   SOCIAL HISTORY:  Widowed; [place] and his  family, in semi-retirement from previous office work.  No history of tobacco  nor alcohol use.   FAMILY HISTORY:  Mother and father both died from malignancies; no notable  family history of cardiac disease.   REVIEW OF SYSTEMS:  Seasonal rhinitis, history of urinary incontinence  attributed to an overactive bladder, chronic arthralgias; all other systems  negative.   RECENT MEDICATIONS:  Diazide, aspirin, oxytocin patch, and Citrucel.   ALLERGIES:  No known allergies.   PHYSICAL EXAMINATION:  GENERAL:  Pleasant, anxious-appearing woman in no  acute distress.  VITAL SIGNS:  Heart rate 90, blood pressure 170/80, respirations 20,  temperature 98.  HEENT:  Anicteric sclerae.  SKIN:  No significant lesions.  NECK:  No jugular venous distention; no carotid bruits.  ENDOCRINE: No thyromegaly.  HEMATOPOIETIC:  No adenopathy.  LUNGS:  Clear.  CARDIAC:  Normal first and second heart sounds; fourth heart sound present.  ABDOMEN:  Soft and nontender; no organomegaly.  EXTREMITIES: Distal pulses intact; no bruits; no edema.  NEUROLOGIC:  Symmetric strength and tone.   LABORATORY DATA:  EKG:  Sinus rhythm, within normal limits.  Right  ventricular conduction delay; minimal nonspecific ST segment abnormality.   IMPRESSION:  [name] presents with relatively low cardiovascular risk and  somewhat atypical and mild symptoms.  Nonetheless, there is a significant  concern for coronary disease.  She will be kept in the hospital tonight on  observation status for serial cardiac markers and EKGs with plans for a  stress Cardiolite study in the morning.  Blood pressure may be elevated due  to the stress of her ER visit.  A determination will be made prior  to  discharge whether or not additional antihypertensive medication is required.  Beta blocker will not be used initially due to planned stress test.  Empiric  treatment with a PPI will also be initiated.                                                [place] Bing, M.D.    RR/MEDQ  D:  05/07/2003  T:  05/07/2003  Job:  [id number]

## 2011-03-02 NOTE — Discharge Summary (Signed)
[place]. [facility]  Patient:    [name], [name]                        MRN: [id number] Adm. Date:  [id number] Disc. Date: [id number] Attending:  Twana First Dictator:   Julien Girt, P.A.                           Discharge Summary  ADMISSION DIAGNOSES: 1. End-stage degenerative joint disease left hip. 2. Hypertension. 3. History of breast cancer with resulting lymphedema in her right arm.  DISCHARGE DIAGNOSES: 1. End-stage degenerative joint disease left hip. 2. Postoperative blood loss anemia. 3. Hypokalemia. 4. Hypertension. 5. History of breast cancer with lymphedema right arm.  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  She began having pain January 2001.  Despite conservative treatment she continued to have pain.  An MRI showed stage IV AVN.  She has pain at rest, pain at night, unrelieved by anti-inflammatories.  She understands the risks, benefits, and possible complications of a left total hip replacement and is without question.  PROCEDURES:  On April 18, 2001, the patient underwent a left total hip replacement.  She tolerated the procedure well.  HOSPITAL COURSE:  On postoperative day #1 surgical wound was well approximated.  Hemoglobin was 9.3.  T-max was 99.2.  Sodium was 126, potassium was 3.9.  Dr. Johnsie Kindred was consulted for hyponatremia.  On postoperative day #2 the patient is doing well.  Her back pain is decreasing.  Hemoglobin was 9.2. Sodium was 125, potassium was 2.9.  INR was 1.8.  She has a slight amount of bleeding from her wound, no serous drainage.  For her hyponatremia she got IV fluids at nasal cannula 75 cc/hr.  For her hypokalemia she got 40 mEq of potassium t.i.d.  On postoperative day #3 patient doing well.  She has no lightheadedness, no dizziness.  Hemoglobin was 8.2.  Potassium was back up to 3.8,  sodium was 132.  INR was 1.8.  Her hyponatremia was improving.  Her hypokalemia had resolved.  On postoperative day #4 patient progressing well with physical therapy.  Hemoglobin was 8.3.  Slight amount of serous drainage from her wound.  She is progressing well with physical therapy.  On postoperative day #5 the patient has progressed well with physical therapy. Surgical wound is well approximated.  A slight bit of serous drainage.  She is metabolically stable.  Her hemoglobin is 8.6.  T-max is 98.6.  She was discharged to home in stable condition, weightbearing as tolerated.  DISCHARGE MEDICATIONS: 1. Hydrochlorothiazide 25 mg 1 p.o. q.d. 2. Nu-Iron 150 mg b.i.d. 3. Percocet 1-2 q.4-6h. p.r.n. pain. 4. Colace 100 mg 1 p.o. b.i.d. 5. Coumadin 4 mg 1 p.o. q.d.  FOLLOW-UP:  We will follow her in the office in 1-1/2 weeks, or sooner if she has problems. DD:  05/02/01 TD:  05/04/01 Job: [id number]

## 2011-04-27 ENCOUNTER — Other Ambulatory Visit: Payer: Self-pay | Admitting: Family Medicine

## 2011-04-27 NOTE — Telephone Encounter (Signed)
Patient has not been seen since 2008     KP

## 2011-04-30 ENCOUNTER — Other Ambulatory Visit: Payer: Self-pay | Admitting: *Deleted

## 2011-04-30 MED ORDER — AMLODIPINE BESYLATE 5 MG PO TABS: 5.0000 mg | ORAL_TABLET | Freq: Every day | ORAL | Status: DC

## 2011-04-30 NOTE — Telephone Encounter (Signed)
escribe medication per fax request  

## 2011-06-05 ENCOUNTER — Encounter (INDEPENDENT_AMBULATORY_CARE_PROVIDER_SITE_OTHER): Payer: Self-pay | Admitting: General Surgery

## 2011-06-05 ENCOUNTER — Ambulatory Visit (INDEPENDENT_AMBULATORY_CARE_PROVIDER_SITE_OTHER): Payer: Medicare Other | Admitting: General Surgery

## 2011-06-05 VITALS — Temp 96.4°F

## 2011-06-05 DIAGNOSIS — IMO0002 Reserved for concepts with insufficient information to code with codable children: Secondary | ICD-10-CM

## 2011-06-05 DIAGNOSIS — L03113 Cellulitis of right upper limb: Secondary | ICD-10-CM | POA: Insufficient documentation

## 2011-06-05 MED ORDER — DOXYCYCLINE HYCLATE 100 MG PO TABS: 100.0000 mg | ORAL_TABLET | Freq: Two times a day (BID) | ORAL | Status: AC

## 2011-06-05 NOTE — Patient Instructions (Signed)
The antibiotic can mix with her Coumadin and make her blood thinner than usual. Therefore, Dr. Alver Fisher office will call you to have your blood checked sometime early next week.  If you start having heavy bleeding from anywhere, stopped the antibiotic and call the doctor's office.

## 2011-06-05 NOTE — Progress Notes (Signed)
Chief Complaint  Patient presents with  . Other    eval of lymphedema in right arm     HPI [name] is a 77 y.o. female.   HPI She has a history of right mastectomy and lymph node dissection. As her breast cancer many years ago. She also has a history of right upper extremity cellulitis. Noticed redness and swelling right arm just above the elbow. This is similar to what she said the past. She was seen approximately 8-9 months ago and treated with Keflex successfully.  She went to have her INR checked as she is on chronic Coumadin. It was 1.8. The nurse noted this and sent her to [place] for evaluation. Dr. Jamey Ripa follows her for her breast cancer. Past Medical History  Diagnosis Date  . Cancer     Past Surgical History  Procedure Date  . Total hip arthroplasty     Family History  Problem Relation Age of Onset  . Cancer Mother   . Cancer Father   . Heart disease Father     Social History History  Substance Use Topics  . Smoking status: Never Smoker   . Smokeless tobacco: Not on file  . Alcohol Use: No    Allergies  Allergen Reactions  . Aspirin     REACTION: rash    Current Outpatient Prescriptions  Medication Sig Dispense Refill  . amLODipine (NORVASC) 5 MG tablet Take 1 tablet (5 mg total) by mouth daily.  30 tablet  5  . Calcium Citrate (CITRACAL PO) Take 630 mg by mouth.        . Estradiol (VAGIFEM) 10 MCG TABS Place vaginally.        . nebivolol (BYSTOLIC) 5 MG tablet Take 5 mg by mouth daily.        . Omega-3 Fatty Acids (FISH OIL) 1200 MG CAPS Take by mouth.        . telmisartan (MICARDIS) 80 MG tablet Take 80 mg by mouth daily.        . vitamin C (ASCORBIC ACID) 500 MG tablet Take 500 mg by mouth daily.        . vitamin E (VITAMIN E) 400 UNIT capsule Take 400 Units by mouth daily.        Marland Kitchen warfarin (COUMADIN) 4 MG tablet Take 4 mg by mouth daily.        Marland Kitchen BABY ASPIRIN PO Take by mouth.        . doxycycline (VIBRA-TABS) 100 MG tablet Take 1 tablet (100 mg  total) by mouth 2 (two) times daily.  28 tablet  0    Review of Systems Review of Systems  Constitutional: Negative for fever and chills.  Musculoskeletal:       Right upper extremity lymphedema.    Temperature 96.4 F (35.8 C).  Physical Exam Physical Exam  Constitutional: She appears well-developed and well-nourished. No distress.  Respiratory:       Right chest wall scar is flat without nodules.  Musculoskeletal: She exhibits edema.       Right upper extremity edema is present. There is redness and warmth just proximal to the elbow. No evidence of trauma. No right axillary adenopathy     Data Reviewed Old chart.  Assessment    Right upper extremity cellulitis. Also has history of lymphedema. This is present today as well.    Plan    Start doxycycline. Have her INR checked more frequently since this can potentiate her Coumadin. I talked to  Dr. Clelia Croft about this as well.  Return visit 3 weeks.       Kasch Borquez J 06/05/2011, 4:45 PM

## 2011-06-11 ENCOUNTER — Encounter (INDEPENDENT_AMBULATORY_CARE_PROVIDER_SITE_OTHER): Payer: Self-pay | Admitting: Surgery

## 2011-06-12 ENCOUNTER — Ambulatory Visit (INDEPENDENT_AMBULATORY_CARE_PROVIDER_SITE_OTHER): Payer: Medicare Other | Admitting: Surgery

## 2011-06-12 ENCOUNTER — Encounter (INDEPENDENT_AMBULATORY_CARE_PROVIDER_SITE_OTHER): Payer: Self-pay | Admitting: Surgery

## 2011-06-12 VITALS — BP 116/68 | HR 60 | Temp 96.7°F

## 2011-06-12 DIAGNOSIS — IMO0002 Reserved for concepts with insufficient information to code with codable children: Secondary | ICD-10-CM

## 2011-06-12 DIAGNOSIS — L03113 Cellulitis of right upper limb: Secondary | ICD-10-CM

## 2011-06-12 NOTE — Progress Notes (Signed)
Chief Complaint  Patient presents with  . Other    post op recheck - 10 day    HPI [name] is a 77 y.o. female.   HPI She has a history of right mastectomy and lymph node dissection. As her breast cancer many years ago. She also has a history of right upper extremity cellulitis. Noticed redness and swelling right arm just above the elbow. This is similar to what she said the past She notes marked improvement in the arm after one week of doxy treatment  Blood pressure 116/68, pulse 60, temperature 96.7 F (35.9 C), temperature source Temporal.  Physical Exam Physical Exam  Constitutional: She appears well-developed and well-nourished. No distress.  Respiratory:       Right chest wall scar is flat without nodules.  Musculoskeletal:she has persistent Right arm lymphedema. The cellulitis appears markedly improved with only faint erythema remaining. The arm is soft, not tight.     Data Reviewed Old chart.  Assessment    Right upper extremity cellulitis. Also has history of lymphedema. This is present today as well.    Plan    Continue doxycycline. Have her INR checked more frequently since this can potentiate her Coumadin. She will call next week when she finishes the antibiotic and let us know how she is doing. I will see her in two weeks      Tyson Parkison J 06/12/2011, 8:58 AM

## 2011-06-19 ENCOUNTER — Telehealth (INDEPENDENT_AMBULATORY_CARE_PROVIDER_SITE_OTHER): Payer: Self-pay | Admitting: General Surgery

## 2011-06-19 NOTE — Telephone Encounter (Signed)
Patient called s/p being seen for cellulitis, was told to call this day and check in to see if she needed antibiotic renewal. She states her arm looks good. She has an appt next week. I called patient and told her that should be fine for her to be off antibiotics and to keep appt for next week.

## 2011-06-26 ENCOUNTER — Encounter (INDEPENDENT_AMBULATORY_CARE_PROVIDER_SITE_OTHER): Payer: Self-pay | Admitting: Surgery

## 2011-06-26 ENCOUNTER — Ambulatory Visit (INDEPENDENT_AMBULATORY_CARE_PROVIDER_SITE_OTHER): Payer: Medicare Other | Admitting: Surgery

## 2011-06-26 VITALS — BP 126/78 | HR 68

## 2011-06-26 DIAGNOSIS — I890 Lymphedema, not elsewhere classified: Secondary | ICD-10-CM

## 2011-06-26 NOTE — Progress Notes (Signed)
Chief Complaint  Patient presents with  . Follow-up    2 week recheck lymphadema    HPI [name] is a 77 y.o. female.   HPI She has a history of right mastectomy and lymph node dissection. As her breast cancer many years ago. She also has a history of right upper extremity cellulitis. Noticed redness and swelling right arm just above the elbow. This is similar to what she said the past She notes resolution of the infection at this time Blood pressure 126/78, pulse 68.  Physical Exam Physical Exam  Constitutional: She appears well-developed and well-nourished. No distress.        Right chest wall scar is flat without nodules.   The cellulitis appears totally resolved The arm is soft, not tight.     Data Reviewed Old chart.  Assessment   Right upper extremity cellulitis resolved. Also has history of lymphedema. This is present today as well.    Plan    RTC PRN we will make a PT referral for lymphedema management     Obed Samek J 06/26/2011, 9:04 AM

## 2011-06-26 NOTE — Patient Instructions (Signed)
Your arm infection seems improved. We would like you to go to physical therapy to see what suggestions they have for management of your lymphedema.  If any further problems with infection come back to see [place].

## 2012-01-21 ENCOUNTER — Encounter: Payer: Self-pay | Admitting: *Deleted

## 2012-04-29 ENCOUNTER — Encounter (INDEPENDENT_AMBULATORY_CARE_PROVIDER_SITE_OTHER): Payer: Self-pay

## 2012-06-04 ENCOUNTER — Other Ambulatory Visit: Payer: Self-pay | Admitting: Dermatology

## 2013-01-26 ENCOUNTER — Telehealth: Payer: Self-pay | Admitting: *Deleted

## 2013-01-26 NOTE — Telephone Encounter (Signed)
Order form faxed to [employer]./ sue

## 2013-01-26 NOTE — Telephone Encounter (Signed)
Order is signed and back to you for mailing or faxing.

## 2013-01-26 NOTE — Telephone Encounter (Signed)
PLEASE SIGN FAX ORDER FOR PATIENT TO RECEIVE NEW BRAS. SECOND TO NATURE DUE TO MASTECTOMY. SUE. CHART ON YOUR DESK.

## 2013-03-15 DIAGNOSIS — S7290XA Unspecified fracture of unspecified femur, initial encounter for closed fracture: Secondary | ICD-10-CM

## 2013-03-15 HISTORY — DX: Unspecified fracture of unspecified femur, initial encounter for closed fracture: S72.90XA

## 2013-04-15 ENCOUNTER — Encounter (HOSPITAL_BASED_OUTPATIENT_CLINIC_OR_DEPARTMENT_OTHER): Payer: Self-pay | Admitting: *Deleted

## 2013-04-15 ENCOUNTER — Other Ambulatory Visit: Payer: Self-pay | Admitting: Internal Medicine

## 2013-04-15 ENCOUNTER — Emergency Department (HOSPITAL_BASED_OUTPATIENT_CLINIC_OR_DEPARTMENT_OTHER): Payer: Medicare Other

## 2013-04-15 ENCOUNTER — Emergency Department (HOSPITAL_BASED_OUTPATIENT_CLINIC_OR_DEPARTMENT_OTHER)
Admission: EM | Admit: 2013-04-15 | Discharge: 2013-04-15 | Disposition: A | Discharge status: Home / Self Care | Payer: Medicare Other | Attending: Emergency Medicine | Admitting: Emergency Medicine

## 2013-04-15 DIAGNOSIS — S3981XA Other specified injuries of abdomen, initial encounter: Secondary | ICD-10-CM | POA: Insufficient documentation

## 2013-04-15 DIAGNOSIS — S8990XA Unspecified injury of unspecified lower leg, initial encounter: Secondary | ICD-10-CM | POA: Insufficient documentation

## 2013-04-15 DIAGNOSIS — K5900 Constipation, unspecified: Secondary | ICD-10-CM | POA: Insufficient documentation

## 2013-04-15 DIAGNOSIS — I498 Other specified cardiac arrhythmias: Secondary | ICD-10-CM | POA: Insufficient documentation

## 2013-04-15 DIAGNOSIS — W19XXXA Unspecified fall, initial encounter: Secondary | ICD-10-CM | POA: Insufficient documentation

## 2013-04-15 DIAGNOSIS — I10 Essential (primary) hypertension: Secondary | ICD-10-CM | POA: Insufficient documentation

## 2013-04-15 DIAGNOSIS — I4891 Unspecified atrial fibrillation: Secondary | ICD-10-CM | POA: Insufficient documentation

## 2013-04-15 DIAGNOSIS — M545 Low back pain: Secondary | ICD-10-CM

## 2013-04-15 DIAGNOSIS — Y9389 Activity, other specified: Secondary | ICD-10-CM | POA: Insufficient documentation

## 2013-04-15 DIAGNOSIS — J984 Other disorders of lung: Secondary | ICD-10-CM

## 2013-04-15 DIAGNOSIS — R911 Solitary pulmonary nodule: Secondary | ICD-10-CM

## 2013-04-15 DIAGNOSIS — Y929 Unspecified place or not applicable: Secondary | ICD-10-CM | POA: Insufficient documentation

## 2013-04-15 DIAGNOSIS — IMO0002 Reserved for concepts with insufficient information to code with codable children: Secondary | ICD-10-CM | POA: Insufficient documentation

## 2013-04-15 DIAGNOSIS — E871 Hypo-osmolality and hyponatremia: Secondary | ICD-10-CM

## 2013-04-15 DIAGNOSIS — Z79899 Other long term (current) drug therapy: Secondary | ICD-10-CM | POA: Insufficient documentation

## 2013-04-15 DIAGNOSIS — Z7901 Long term (current) use of anticoagulants: Secondary | ICD-10-CM | POA: Insufficient documentation

## 2013-04-15 HISTORY — DX: Unspecified atrial fibrillation: I48.91

## 2013-04-15 LAB — BASIC METABOLIC PANEL
GFR calc Af Amer: >90 mL/min (ref >90)
GFR calc non Af Amer: 86 mL/min — ABNORMAL LOW (ref >90)
Potassium: 3.9 mEq/L (ref 3.5–5.1)
Sodium: 123 mEq/L — ABNORMAL LOW (ref 135–145)

## 2013-04-15 LAB — URINALYSIS, ROUTINE W REFLEX MICROSCOPIC
Ketones, ur: 15 mg/dL — AB
Leukocytes, UA: NEGATIVE
Nitrite: NEGATIVE
Protein, ur: NEGATIVE mg/dL

## 2013-04-15 LAB — CBC WITH DIFFERENTIAL/PLATELET
Basophils Relative: 0 % (ref 0–1)
Eosinophils Absolute: 0 10*3/uL (ref 0.0–0.7)
Lymphs Abs: 0.4 10*3/uL — ABNORMAL LOW (ref 0.7–4.0)
MCH: 31.8 pg (ref 26.0–34.0)
MCHC: 35.7 g/dL (ref 30.0–36.0)
Neutrophils Relative %: 78 % — ABNORMAL HIGH (ref 43–77)
Platelets: 134 10*3/uL — ABNORMAL LOW (ref 150–400)
RBC: 4.15 MIL/uL (ref 3.87–5.11)

## 2013-04-15 LAB — PROTIME-INR
INR: 1.59 — ABNORMAL HIGH (ref 0.00–1.49)
Prothrombin Time: 18.5 seconds — ABNORMAL HIGH (ref 11.6–15.2)

## 2013-04-15 MED ORDER — MAGNESIUM CITRATE PO SOLN: 296.0000 mL | Freq: Once | ORAL | Status: DC

## 2013-04-15 MED ORDER — IOHEXOL 300 MG/ML  SOLN
50.0000 mL | Freq: Once | INTRAMUSCULAR | Status: AC | PRN
  Administered 2013-04-15: 50 mL via ORAL

## 2013-04-15 MED ORDER — SODIUM CHLORIDE 0.9 % IV BOLUS (SEPSIS)
1000.0000 mL | Freq: Once | INTRAVENOUS | Status: AC
  Administered 2013-04-15: 1000 mL via INTRAVENOUS

## 2013-04-15 MED ORDER — IOHEXOL 300 MG/ML  SOLN
100.0000 mL | Freq: Once | INTRAMUSCULAR | Status: AC | PRN
  Administered 2013-04-15: 100 mL via INTRAVENOUS

## 2013-04-15 NOTE — ED Notes (Signed)
Patient preparing for discharge. 

## 2013-04-15 NOTE — ED Notes (Signed)
MD at bedside. 

## 2013-04-15 NOTE — ED Notes (Addendum)
Patient states that she fell last Thursday because she tripped on a carpet. She states that she was "fine" but "a little" sore in her left hip area. When asked what brought her to the ER she stated multiple times that she came to the ER because she hasnt had a bowel movement. Despite using OTC products at home.

## 2013-04-15 NOTE — ED Provider Notes (Signed)
History    CSN: [id number] Arrival date & time 04/15/13  4098  First MD Initiated Contact with Patient 04/15/13 [phone]     Chief Complaint  Patient presents with  . Constipation   (Consider location/radiation/quality/duration/timing/severity/associated sxs/prior Treatment) Patient is a 79 y.o. female presenting with constipation, fall, and flank pain.  Constipation Fall  Flank Pain   Pt with history of afib on coumadin reports she had a mechanical fall about 5 days ago landing on her left hip and twisting on the way down. She has had moderate to severe aching pain in L hip, LLQ abd and L flank since then, worse with lying down and movement although she has been able to walk. She reports she has been unable to have a BM since the fall, denies any dysuria, hematuria, or vomiting although she has felt nauseated at times. She has attempted multiple OTC remedies for constipation including Miralax, MoM and glycerin suppositories without improvement.  Past Medical History  Diagnosis Date  . Cancer   . HTN (hypertension)   . Atrial tachycardia   . Atrial fibrillation    Past Surgical History  Procedure Laterality Date  . Total hip arthroplasty    . Mastectomy partial / lumpectomy w/ axillary lymphadenectomy  04/05/1987    Right - Dr Maple Hudson  . Breast biopsy  05/04/1987    Right Dr Maple Hudson   Family History  Problem Relation Age of Onset  . Cancer Mother   . Cancer Father   . Heart disease Father    History  Substance Use Topics  . Smoking status: Never Smoker   . Smokeless tobacco: Not on file  . Alcohol Use: No   OB History   Grav Para Term Preterm Abortions TAB SAB Ect Mult Living                 Review of Systems  Gastrointestinal: Positive for constipation.  Genitourinary: Positive for flank pain.   All other systems reviewed and are negative except as noted in HPI.   Allergies  Aspirin  Home Medications   Current Outpatient Rx  Name  Route  Sig  Dispense  Refill   . EXPIRED: amLODipine (NORVASC) 5 MG tablet   Oral   Take 1 tablet (5 mg total) by mouth daily.   30 tablet   5   . Calcium Citrate (CITRACAL PO)   Oral   Take 630 mg by mouth.           . Cholecalciferol (VITAMIN D) 400 UNITS capsule   Oral   Take 400 Units by mouth daily.           . Estradiol (VAGIFEM) 10 MCG TABS   Vaginal   Place vaginally.           . nebivolol (BYSTOLIC) 5 MG tablet   Oral   Take 5 mg by mouth daily.           . Omega-3 Fatty Acids (FISH OIL) 1200 MG CAPS   Oral   Take by mouth.           . telmisartan (MICARDIS) 80 MG tablet   Oral   Take 80 mg by mouth daily.           Marland Kitchen warfarin (COUMADIN) 4 MG tablet   Oral   Take 4 mg by mouth daily.            BP 139/75  Pulse 69  Temp(Src) 98 F (36.7 C) (Oral)  Resp 18  SpO2 100% Physical Exam  Nursing note and vitals reviewed. Constitutional: She is oriented to person, place, and time. She appears well-developed and well-nourished.  HENT:  Head: Normocephalic and atraumatic.  Eyes: EOM are normal. Pupils are equal, round, and reactive to light.  Neck: Normal range of motion. Neck supple.  Cardiovascular: Normal rate, normal heart sounds and intact distal pulses.   Pulmonary/Chest: Effort normal and breath sounds normal.  Abdominal: Soft. Bowel sounds are normal. She exhibits no distension. There is no tenderness. There is no rebound and no guarding.  Genitourinary:  No fecal impaction, stool color normal  Musculoskeletal: Normal range of motion. She exhibits tenderness (L lower back paraspinal area and L pelvis). She exhibits no edema.  Neurological: She is alert and oriented to person, place, and time. She has normal strength. No cranial nerve deficit or sensory deficit.  Skin: Skin is warm and dry. No rash noted.  Psychiatric: She has a normal mood and affect.    ED Course  Procedures (including critical care time) Labs Reviewed  CBC WITH DIFFERENTIAL - Abnormal; Notable  for the following:    Platelets 134 (*)    Neutrophils Relative % 78 (*)    Lymphocytes Relative 9 (*)    Lymphs Abs 0.4 (*)    Monocytes Relative 13 (*)    All other components within normal limits  BASIC METABOLIC PANEL - Abnormal; Notable for the following:    Sodium 123 (*)    Chloride 86 (*)    Glucose, Bld 111 (*)    GFR calc non Af Amer 86 (*)    All other components within normal limits  PROTIME-INR - Abnormal; Notable for the following:    Prothrombin Time 18.5 (*)    INR 1.59 (*)    All other components within normal limits  URINALYSIS, ROUTINE W REFLEX MICROSCOPIC - Abnormal; Notable for the following:    Ketones, ur 15 (*)    All other components within normal limits   Dg Hip Complete Right  04/15/2013   *RADIOLOGY REPORT*  Clinical Data: Larey Seat last Thursday, some right hip pain, history of bilateral hip replacements, the patient is on blood anticoagulation  RIGHT HIP - COMPLETE 2+ VIEW  Comparison: None.  Findings: Bilateral total hip replacements are noted.  No acute fracture is seen.  There is no evidence of prosthetic motion on the images obtained.  The pelvic rami are intact.  The SI joints appear normal.  The bones are somewhat osteopenic.  IMPRESSION: Bilateral total hip replacements.  No acute fracture.   Original Report Authenticated By: Dwyane Dee, M.D.   Ct Abdomen Pelvis W Contrast  04/15/2013   *RADIOLOGY REPORT*  Clinical Data: Recent fall with back and flank pain, the patient is on Coumadin  CT ABDOMEN AND PELVIS WITH CONTRAST  Technique:  Multidetector CT imaging of the abdomen and pelvis was performed following the standard protocol during bolus administration of intravenous contrast.  Contrast: 50mL OMNIPAQUE IOHEXOL 300 MG/ML  SOLN, OMNIPAQUE IOHEXOL 300 MG/ML  SOLN  Comparison: CT pelvis of 08/21/2007  Findings: On the lung window images, there is an 9 mm noncalcified nodule medially within the right lower lobe.  In addition there is a nodular opacity  abutting the right hemidiaphragm probably representing pleural plaque.  CT of the chest is recommended to evaluate for possible malignancy.  There is mild cardiomegaly present.  Several well circumscribed low attenuation structures are scattered throughout the liver most consistent with simple  cysts.  No hepatic ductal dilatation is noted.  The gallbladder is somewhat contracted and no calcified gallstones are seen.  The common bile duct is somewhat prominent near the ampulla measuring up to 9 mm.  Considerations are that of distal common bile duct stricture, calculus, or mass although no definite mass is seen.  MR with MRCP may be help to assess further and correlation with LFTs is recommended.  There is minimal prominence of the pancreatic duct but no pancreatic head lesion is evident.  The adrenal glands and spleen are unremarkable.  The stomach is not well distended but no abnormality is seen.  The kidneys enhance with no calculus or mass and no hydronephrosis is seen.  On delayed images, the pelvocaliceal systems and there is slight extrinsic indentation on the left renal pelvis probably by adjacent vessel, but no other abnormality is evident.  The proximal ureters are normal in caliber.  The abdominal aorta is normal in caliber with mild to moderate atheromatous change present.  No adenopathy is seen.  Linear artifacts created by bilateral total hip replacements obscure some of the lower pelvis.  No obvious pelvic mass or hematoma is seen.  Urinary bladder cannot be well evaluated.  There is a moderate amount of feces throughout the colon.  The terminal ileum is unremarkable.  No free fluid is seen within the pelvis. No retroperitoneal hematoma is noted.  There is a mild lumbar scoliosis convex to the left with diffuse degenerative disc disease.  IMPRESSION:  1.  No retroperitoneal hematoma is seen. 2.  9 mm noncalcified right lower lobe lung nodule suspicious for primary or metastatic lung disease.  Also  adjacent pleural plaque. Recommend CT of the chest to assess further. 3.  Prominent common bile duct.  Correlate with LFTs and consider MRI with MRCP. 4.  Bilateral total hip replacements.   Original Report Authenticated By: Dwyane Dee, M.D.   1. Fall, initial encounter   2. Low back pain   3. Constipation   4. Hyponatremia   5. Pulmonary nodule     MDM  Reviewed CT images, discuss incidental findings with patient and family at bedside. Advised close PCP follow up for evaluation of nodule. She had breast cancer >25years ago with no known recurrence. Offered enema here for constipation, but prefers to do it at home. Also recommended Magnesium Citrate. Avoid narcotics for now. Labs show mild hyponatremia as well.   Charles B. Bernette Mayers, MD 04/15/13 1001

## 2013-04-18 ENCOUNTER — Encounter (HOSPITAL_BASED_OUTPATIENT_CLINIC_OR_DEPARTMENT_OTHER): Payer: Self-pay | Admitting: *Deleted

## 2013-04-18 ENCOUNTER — Emergency Department (HOSPITAL_BASED_OUTPATIENT_CLINIC_OR_DEPARTMENT_OTHER): Payer: Medicare Other

## 2013-04-18 ENCOUNTER — Observation Stay (HOSPITAL_BASED_OUTPATIENT_CLINIC_OR_DEPARTMENT_OTHER)
Admission: EM | Admit: 2013-04-18 | Discharge: 2013-04-20 | Disposition: A | Discharge status: Home / Self Care | Payer: Medicare Other | Attending: Internal Medicine | Admitting: Internal Medicine

## 2013-04-18 DIAGNOSIS — R911 Solitary pulmonary nodule: Secondary | ICD-10-CM

## 2013-04-18 DIAGNOSIS — L03113 Cellulitis of right upper limb: Secondary | ICD-10-CM

## 2013-04-18 DIAGNOSIS — I482 Chronic atrial fibrillation, unspecified: Secondary | ICD-10-CM | POA: Diagnosis present

## 2013-04-18 DIAGNOSIS — S72109A Unspecified trochanteric fracture of unspecified femur, initial encounter for closed fracture: Secondary | ICD-10-CM | POA: Insufficient documentation

## 2013-04-18 DIAGNOSIS — Y92009 Unspecified place in unspecified non-institutional (private) residence as the place of occurrence of the external cause: Secondary | ICD-10-CM | POA: Insufficient documentation

## 2013-04-18 DIAGNOSIS — Z9660 Presence of unspecified orthopedic joint implant: Secondary | ICD-10-CM | POA: Insufficient documentation

## 2013-04-18 DIAGNOSIS — I4891 Unspecified atrial fibrillation: Secondary | ICD-10-CM | POA: Insufficient documentation

## 2013-04-18 DIAGNOSIS — Z7901 Long term (current) use of anticoagulants: Secondary | ICD-10-CM | POA: Insufficient documentation

## 2013-04-18 DIAGNOSIS — M949 Disorder of cartilage, unspecified: Secondary | ICD-10-CM

## 2013-04-18 DIAGNOSIS — E871 Hypo-osmolality and hyponatremia: Secondary | ICD-10-CM | POA: Insufficient documentation

## 2013-04-18 DIAGNOSIS — T84049A Periprosthetic fracture around unspecified internal prosthetic joint, initial encounter: Principal | ICD-10-CM | POA: Insufficient documentation

## 2013-04-18 DIAGNOSIS — R918 Other nonspecific abnormal finding of lung field: Secondary | ICD-10-CM | POA: Insufficient documentation

## 2013-04-18 DIAGNOSIS — S72002A Fracture of unspecified part of neck of left femur, initial encounter for closed fracture: Secondary | ICD-10-CM

## 2013-04-18 DIAGNOSIS — Z96649 Presence of unspecified artificial hip joint: Secondary | ICD-10-CM | POA: Insufficient documentation

## 2013-04-18 DIAGNOSIS — Z79899 Other long term (current) drug therapy: Secondary | ICD-10-CM | POA: Insufficient documentation

## 2013-04-18 DIAGNOSIS — M1990 Unspecified osteoarthritis, unspecified site: Secondary | ICD-10-CM

## 2013-04-18 DIAGNOSIS — I10 Essential (primary) hypertension: Secondary | ICD-10-CM | POA: Insufficient documentation

## 2013-04-18 DIAGNOSIS — Z853 Personal history of malignant neoplasm of breast: Secondary | ICD-10-CM | POA: Insufficient documentation

## 2013-04-18 DIAGNOSIS — X500XXA Overexertion from strenuous movement or load, initial encounter: Secondary | ICD-10-CM | POA: Insufficient documentation

## 2013-04-18 DIAGNOSIS — S7292XA Unspecified fracture of left femur, initial encounter for closed fracture: Secondary | ICD-10-CM

## 2013-04-18 DIAGNOSIS — Y831 Surgical operation with implant of artificial internal device as the cause of abnormal reaction of the patient, or of later complication, without mention of misadventure at the time of the procedure: Secondary | ICD-10-CM | POA: Insufficient documentation

## 2013-04-18 LAB — BASIC METABOLIC PANEL
CO2: 25 mEq/L (ref 19–32)
Calcium: 10 mg/dL (ref 8.4–10.5)
Chloride: 84 mEq/L — ABNORMAL LOW (ref 96–112)
Potassium: 4.2 mEq/L (ref 3.5–5.1)
Sodium: 122 mEq/L — ABNORMAL LOW (ref 135–145)

## 2013-04-18 LAB — URINALYSIS, ROUTINE W REFLEX MICROSCOPIC
Glucose, UA: NEGATIVE mg/dL
Leukocytes, UA: NEGATIVE
Protein, ur: NEGATIVE mg/dL
Specific Gravity, Urine: 1.004 — ABNORMAL LOW (ref 1.005–1.030)
pH: 7.5 (ref 5.0–8.0)

## 2013-04-18 LAB — CBC WITH DIFFERENTIAL/PLATELET
Hemoglobin: 13.6 g/dL (ref 12.0–15.0)
Lymphocytes Relative: 6 % — ABNORMAL LOW (ref 12–46)
Lymphs Abs: 0.5 10*3/uL — ABNORMAL LOW (ref 0.7–4.0)
Neutrophils Relative %: 85 % — ABNORMAL HIGH (ref 43–77)
Platelets: UNDETERMINED 10*3/uL (ref 150–400)
RBC: 4.25 MIL/uL (ref 3.87–5.11)
WBC: 8.2 10*3/uL (ref 4.0–10.5)

## 2013-04-18 LAB — PROTIME-INR: Prothrombin Time: 22.5 seconds — ABNORMAL HIGH (ref 11.6–15.2)

## 2013-04-18 MED ORDER — SODIUM CHLORIDE 0.9 % IV SOLN: INTRAVENOUS | Status: DC

## 2013-04-18 MED ORDER — SODIUM CHLORIDE 0.9 % IV SOLN
INTRAVENOUS | Status: DC
  Administered 2013-04-18: 23:00:00 via INTRAVENOUS

## 2013-04-18 NOTE — ED Notes (Signed)
Pt states she was standing today, turned and left hip "snapped" Has implant.

## 2013-04-18 NOTE — ED Notes (Signed)
MD at bedside. 

## 2013-04-18 NOTE — ED Notes (Signed)
Pt assisted onto bedside commode.

## 2013-04-18 NOTE — ED Provider Notes (Signed)
History  This chart was scribed for Katie Baker, MD by Greggory Stallion, ED Scribe. This patient was seen in room MH05/MH05 and the patient's care was started at 6:31 PM.  CSN: [id number] Arrival date & time 04/18/13  1810   Chief Complaint  Patient presents with  . Hip Injury   The history is provided by the patient. No language interpreter was used.    HPI Comments: [name] is a 79 y.o. female who presents to the Emergency Department complaining of left hip injury that happened today prior to arrival. Pt states she was standing, turned, and her left hip snapped. Pt states she had another left hip injury 10 days ago. She states she went and had it looked at and everything was okay. She states she has a hip implant. Pt states she can not bear weight since it happened this morning. She states she is having some numbness in her lower right leg. Pt states she does not want pain medication right now. She states she would like some Tylenol.  Past Medical History  Diagnosis Date  . Cancer   . HTN (hypertension)   . Atrial tachycardia   . Atrial fibrillation    Past Surgical History  Procedure Laterality Date  . Total hip arthroplasty    . Mastectomy partial / lumpectomy w/ axillary lymphadenectomy  04/05/1987    Right - Dr Maple Hudson  . Breast biopsy  05/04/1987    Right Dr Maple Hudson   Family History  Problem Relation Age of Onset  . Cancer Mother   . Cancer Father   . Heart disease Father    History  Substance Use Topics  . Smoking status: Never Smoker   . Smokeless tobacco: Not on file  . Alcohol Use: No   OB History   Grav Para Term Preterm Abortions TAB SAB Ect Mult Living                 Review of Systems  Musculoskeletal: Positive for arthralgias.  Neurological: Positive for numbness.  All other systems reviewed and are negative.    Allergies  Aspirin  Home Medications   Current Outpatient Rx  Name  Route  Sig  Dispense  Refill  . EXPIRED: amLODipine  (NORVASC) 5 MG tablet   Oral   Take 1 tablet (5 mg total) by mouth daily.   30 tablet   5   . Calcium Citrate (CITRACAL PO)   Oral   Take 630 mg by mouth.           . Cholecalciferol (VITAMIN D) 400 UNITS capsule   Oral   Take 400 Units by mouth daily.           . Estradiol (VAGIFEM) 10 MCG TABS   Vaginal   Place vaginally.           . magnesium citrate solution   Oral   Take 296 mLs by mouth once.   300 mL   0   . nebivolol (BYSTOLIC) 5 MG tablet   Oral   Take 5 mg by mouth daily.           . Omega-3 Fatty Acids (FISH OIL) 1200 MG CAPS   Oral   Take by mouth.           . telmisartan (MICARDIS) 80 MG tablet   Oral   Take 80 mg by mouth daily.           Marland Kitchen warfarin (COUMADIN) 4 MG  tablet   Oral   Take 4 mg by mouth daily.            BP 127/73  Pulse 71  Temp(Src) 98.9 F (37.2 C) (Oral)  Resp 18  Ht 5\' 1"  (1.549 m)  Wt 141 lb (63.957 kg)  BMI 26.66 kg/m2  SpO2 99%  Physical Exam  Nursing note and vitals reviewed. Constitutional: She is oriented to person, place, and time. She appears well-developed and well-nourished.  Non-toxic appearance. No distress.  HENT:  Head: Normocephalic and atraumatic.  Eyes: Conjunctivae, EOM and lids are normal. Pupils are equal, round, and reactive to light.  Neck: Normal range of motion. Neck supple. No tracheal deviation present. No mass present.  Cardiovascular: Normal rate, regular rhythm and normal heart sounds.  Exam reveals no gallop.   No murmur heard. Pulmonary/Chest: Effort normal and breath sounds normal. No stridor. No respiratory distress. She has no decreased breath sounds. She has no wheezes. She has no rhonchi. She has no rales.  Abdominal: Soft. Normal appearance and bowel sounds are normal. She exhibits no distension. There is no tenderness. There is no rebound and no CVA tenderness.  Musculoskeletal: She exhibits no edema.  Tenderness at the left greater trochanter. Limited ROM of lower right  leg due to pain.  Neurological: She is alert and oriented to person, place, and time. She has normal strength. No cranial nerve deficit or sensory deficit. GCS eye subscore is 4. GCS verbal subscore is 5. GCS motor subscore is 6.  Skin: Skin is warm and dry. No abrasion and no rash noted.  Psychiatric: She has a normal mood and affect. Her speech is normal and behavior is normal.    ED Course  Procedures (including critical care time)  DIAGNOSTIC STUDIES: Oxygen Saturation is 99% on RA, normal by my interpretation.    COORDINATION OF CARE: 7:16 PM-Discussed treatment plan which includes consultation with ortho with pt at bedside and pt agreed to plan.   Labs Reviewed  CBC WITH DIFFERENTIAL  BASIC METABOLIC PANEL   Dg Hip Complete Left  04/18/2013   *RADIOLOGY REPORT*  Clinical Data: Rate, felt a pop in left hip when she turned, now unable to bear weight, hip prosthesis 12 years ago  LEFT HIP - COMPLETE 2+ VIEW  Comparison: Pelvic/right hip radiographs 04/15/2013  Findings: Acetabular and femoral components of bilateral hip prostheses identified without dislocation. Diffuse osseous demineralization. SI joints symmetric. Minimally distracted fracture of the left greater trochanter new since 04/15/2013. No additional fracture, dislocation, or bone destruction.  IMPRESSION: Avulsion fracture left greater trochanter. Osseous demineralization.   Original Report Authenticated By: Ulyses Southward, M.D.   No diagnosis found.  MDM  Patient given IV fluids here. Hyponatremia noted. She also has a right greater trochanteric fracture. Spoke with Dr. been recommended that the patient be sent to [facility] for Dr. Despina Hick to see him Monday       I personally performed the services described in this documentation, which was scribed in my presence. The recorded information has been reviewed and is accurate.    Katie Baker, MD 04/18/13 2211

## 2013-04-18 NOTE — ED Notes (Signed)
Pt c/o pain at piv site, slight amount of swelling noted, leaking at insertion site.  Removed catheter, tip intact.

## 2013-04-19 ENCOUNTER — Encounter (HOSPITAL_COMMUNITY): Payer: Self-pay

## 2013-04-19 ENCOUNTER — Inpatient Hospital Stay (HOSPITAL_COMMUNITY): Payer: Medicare Other

## 2013-04-19 DIAGNOSIS — IMO0002 Reserved for concepts with insufficient information to code with codable children: Secondary | ICD-10-CM

## 2013-04-19 DIAGNOSIS — E871 Hypo-osmolality and hyponatremia: Secondary | ICD-10-CM | POA: Diagnosis present

## 2013-04-19 DIAGNOSIS — R911 Solitary pulmonary nodule: Secondary | ICD-10-CM

## 2013-04-19 DIAGNOSIS — S72002A Fracture of unspecified part of neck of left femur, initial encounter for closed fracture: Secondary | ICD-10-CM

## 2013-04-19 DIAGNOSIS — I482 Chronic atrial fibrillation, unspecified: Secondary | ICD-10-CM | POA: Diagnosis present

## 2013-04-19 LAB — BASIC METABOLIC PANEL
BUN: 5 mg/dL — ABNORMAL LOW (ref 6–23)
CO2: 24 mEq/L (ref 19–32)
Calcium: 8.7 mg/dL (ref 8.4–10.5)
GFR calc non Af Amer: >90 mL/min (ref >90)
Glucose, Bld: 97 mg/dL (ref 70–99)
Sodium: 127 mEq/L — ABNORMAL LOW (ref 135–145)

## 2013-04-19 LAB — CBC
HCT: 33.1 % — ABNORMAL LOW (ref 36.0–46.0)
Hemoglobin: 11.5 g/dL — ABNORMAL LOW (ref 12.0–15.0)
MCH: 31 pg (ref 26.0–34.0)
MCHC: 34.7 g/dL (ref 30.0–36.0)
MCV: 89.2 fL (ref 78.0–100.0)
RBC: 3.71 MIL/uL — ABNORMAL LOW (ref 3.87–5.11)

## 2013-04-19 LAB — PROTIME-INR: INR: 2.77 — ABNORMAL HIGH (ref 0.00–1.49)

## 2013-04-19 MED ORDER — DOCUSATE SODIUM 100 MG PO CAPS
100.0000 mg | ORAL_CAPSULE | Freq: Two times a day (BID) | ORAL | Status: DC
  Administered 2013-04-19 – 2013-04-20 (×4): 100 mg via ORAL
  Filled 2013-04-19 (×5): qty 1

## 2013-04-19 MED ORDER — IOHEXOL 300 MG/ML  SOLN
80.0000 mL | Freq: Once | INTRAMUSCULAR | Status: AC | PRN
  Administered 2013-04-19: 80 mL via INTRAVENOUS

## 2013-04-19 MED ORDER — AMLODIPINE BESYLATE 5 MG PO TABS
5.0000 mg | ORAL_TABLET | Freq: Every day | ORAL | Status: DC
  Administered 2013-04-19 – 2013-04-20 (×2): 5 mg via ORAL
  Filled 2013-04-19 (×2): qty 1

## 2013-04-19 MED ORDER — SODIUM CHLORIDE 0.9 % IV SOLN
INTRAVENOUS | Status: DC
  Administered 2013-04-19 – 2013-04-20 (×2): via INTRAVENOUS

## 2013-04-19 MED ORDER — HYDROCODONE-ACETAMINOPHEN 5-325 MG PO TABS: 1.0000 | ORAL_TABLET | Freq: Four times a day (QID) | ORAL | Status: DC | PRN

## 2013-04-19 MED ORDER — SODIUM CHLORIDE 0.9 % IV SOLN: INTRAVENOUS | Status: DC

## 2013-04-19 MED ORDER — POLYETHYLENE GLYCOL 3350 17 G PO PACK
17.0000 g | PACK | Freq: Every day | ORAL | Status: DC | PRN
  Administered 2013-04-20: 17 g via ORAL
  Filled 2013-04-19: qty 1

## 2013-04-19 MED ORDER — MAGNESIUM CITRATE PO SOLN
296.0000 mL | Freq: Once | ORAL | Status: DC
  Filled 2013-04-19: qty 296

## 2013-04-19 MED ORDER — IRBESARTAN 75 MG PO TABS
75.0000 mg | ORAL_TABLET | Freq: Every day | ORAL | Status: DC
  Administered 2013-04-19 – 2013-04-20 (×2): 75 mg via ORAL
  Filled 2013-04-19 (×2): qty 1

## 2013-04-19 MED ORDER — NEBIVOLOL HCL 5 MG PO TABS
5.0000 mg | ORAL_TABLET | Freq: Every day | ORAL | Status: DC
  Administered 2013-04-19 – 2013-04-20 (×2): 5 mg via ORAL
  Filled 2013-04-19 (×2): qty 1

## 2013-04-19 MED ORDER — TRAMADOL HCL 50 MG PO TABS
50.0000 mg | ORAL_TABLET | Freq: Four times a day (QID) | ORAL | Status: DC | PRN
  Administered 2013-04-19 (×3): 50 mg via ORAL
  Filled 2013-04-19 (×3): qty 1

## 2013-04-19 NOTE — Progress Notes (Signed)
[facility] PROGRESS NOTE  [name] GNF:[id number] DOB: October 23, 1933 DOA: 04/18/2013 PCP: Kari Baars, MD  Assessment/Plan: Left Hip Fracture -Per ortho, it appears no surgical intervention is required. -Has had minimal pain today. -Plan will be SNF at DC.  Hyponatremia -Improving. -Continue IVF and recheck levels in am.  Atrial Fibrillation -Rate controlled. -On chronic anticoagulation with coumadin; pharmacy dosing.  Right Lung Nodule -Patient had a CT abdomen last week, where a right base lung nodule was noted.  -Given her h/o breast cancer, a CT chest had been scheduled as an OP for tomorrow for further evaluation. -As she is in the hospital, Will go ahead an order as an inpatient. -She can follow with her OP provider for this issue.  Code Status: Full code Family Communication: Daughter at bedside.  Disposition Plan: SNF   Consultants:  Ortho, Dr. Shelle Iron   Antibiotics:  None   Subjective: Feels well. Minimal left hip pain.  Objective: Filed Vitals:   04/19/13 0126 04/19/13 0400 04/19/13 0717 04/19/13 0917  BP: 143/70   126/72  Pulse: 76     Temp: 98.4 F (36.9 C)     TempSrc: Oral     Resp: 16 16 18    Height: 5\' 1"  (1.549 m)     Weight: 64.2 kg (141 lb 8.6 oz)     SpO2: 98%  99%     Intake/Output Summary (Last 24 hours) at 04/19/13 1310 Last data filed at 04/19/13 0700  Gross per 24 hour  Intake 566.25 ml  Output    375 ml  Net 191.25 ml   Filed Weights   04/18/13 1824 04/19/13 0126  Weight: 63.957 kg (141 lb) 64.2 kg (141 lb 8.6 oz)    Exam:   General:  AA Ox3, NAD, pleasant  Cardiovascular: RRR, no M/R/G  Respiratory: CTA B  Abdomen: S/NT/ND/+BS/no masses or organomegaly noted.  Extremities: 1+edema bilaterally   Neurologic:  Grossly intact and non-focal.  Data Reviewed: Basic Metabolic Panel:  Recent Labs Lab 04/15/13 0745 04/18/13 2103 04/19/13 0455  NA 123* 122* 127*  K 3.9 4.2 3.6  CL 86* 84* 93*  CO2 25  25 24   GLUCOSE 111* 114* 97  BUN 10 7 5*  CREATININE 0.60 0.50 0.50  CALCIUM 9.9 10.0 8.7   Liver Function Tests: No results found for this basename: AST, ALT, ALKPHOS, BILITOT, PROT, ALBUMIN,  in the last 168 hours No results found for this basename: LIPASE, AMYLASE,  in the last 168 hours No results found for this basename: AMMONIA,  in the last 168 hours CBC:  Recent Labs Lab 04/15/13 0745 04/18/13 2103 04/19/13 0455  WBC 4.6 8.2 5.1  NEUTROABS 3.5 7.0  --   HGB 13.2 13.6 11.5*  HCT 37.0 37.7 33.1*  MCV 89.2 88.7 89.2  PLT 134* PLATELET CLUMPS NOTED ON SMEAR, UNABLE TO ESTIMATE 150   Cardiac Enzymes: No results found for this basename: CKTOTAL, CKMB, CKMBINDEX, TROPONINI,  in the last 168 hours BNP (last 3 results) No results found for this basename: PROBNP,  in the last 8760 hours CBG: No results found for this basename: GLUCAP,  in the last 168 hours  No results found for this or any previous visit (from the past 240 hour(s)).   Studies: Dg Hip Complete Left  04/18/2013   *RADIOLOGY REPORT*  Clinical Data: Rate, felt a pop in left hip when she turned, now unable to bear weight, hip prosthesis 12 years ago  LEFT HIP - COMPLETE 2+ VIEW  Comparison: Pelvic/right hip radiographs 04/15/2013  Findings: Acetabular and femoral components of bilateral hip prostheses identified without dislocation. Diffuse osseous demineralization. SI joints symmetric. Minimally distracted fracture of the left greater trochanter new since 04/15/2013. No additional fracture, dislocation, or bone destruction.  IMPRESSION: Avulsion fracture left greater trochanter. Osseous demineralization.   Original Report Authenticated By: Ulyses Southward, M.D.    Scheduled Meds: . amLODipine  5 mg Oral Daily  . docusate sodium  100 mg Oral BID  . irbesartan  75 mg Oral Daily  . magnesium citrate  296 mL Oral Once  . nebivolol  5 mg Oral Daily   Continuous Infusions: . sodium chloride 75 mL/hr at 04/19/13 1126     Principal Problem:   Hip fracture, left Active Problems:   HYPERTENSION   Hyponatremia   Atrial fibrillation   Lung nodule    Time spent: 35 minutes.    Chaya Jan  [facility] Pager [phone]  If 7PM-7AM, please contact night-coverage at [email or web address], password [facility] 04/19/2013, 1:10 PM  LOS: 1 day

## 2013-04-19 NOTE — Consult Note (Signed)
Reason for Consult: Left hip painReferring Physician: EDP  [name] is an 79 y.o. female.  HPI: Larey Seat yesterday. Seen at [facility] referred.  Past Medical History  Diagnosis Date  . Cancer   . HTN (hypertension)   . Atrial tachycardia   . Atrial fibrillation     Past Surgical History  Procedure Laterality Date  . Total hip arthroplasty    . Mastectomy partial / lumpectomy w/ axillary lymphadenectomy  04/05/1987    Right - Dr Maple Hudson  . Breast biopsy  05/04/1987    Right Dr Maple Hudson  . Joint replacement      Family History  Problem Relation Age of Onset  . Cancer Mother   . Cancer Father   . Heart disease Father     Social History:  reports that she has never smoked. She does not have any smokeless tobacco history on file. She reports that she does not drink alcohol or use illicit drugs.  Allergies:  Allergies  Allergen Reactions  . Aspirin Rash    REACTION: rash    Medications: I have reviewed the patient's current medications.  Results for orders placed during the hospital encounter of 04/18/13 (from the past 48 hour(s))  CBC WITH DIFFERENTIAL     Status: Abnormal   Collection Time    04/18/13  9:03 PM      Result Value Range   WBC 8.2  4.0 - 10.5 K/uL   RBC 4.25  3.87 - 5.11 MIL/uL   Hemoglobin 13.6  12.0 - 15.0 g/dL   HCT 16.1  09.6 - 04.5 %   MCV 88.7  78.0 - 100.0 fL   MCH 32.0  26.0 - 34.0 pg   MCHC 36.1 (*) 30.0 - 36.0 g/dL   RDW 40.9  81.1 - 91.4 %   Platelets PLATELET CLUMPS NOTED ON SMEAR, UNABLE TO ESTIMATE  150 - 400 K/uL   Comment: SPECIMEN CHECKED FOR CLOTS     REPEATED TO VERIFY   Neutrophils Relative % 85 (*) 43 - 77 %   Neutro Abs 7.0  1.7 - 7.7 K/uL   Lymphocytes Relative 6 (*) 12 - 46 %   Lymphs Abs 0.5 (*) 0.7 - 4.0 K/uL   Monocytes Relative 7  3 - 12 %   Monocytes Absolute 0.6  0.1 - 1.0 K/uL   Eosinophils Relative 2  0 - 5 %   Eosinophils Absolute 0.2  0.0 - 0.7 K/uL   Basophils Relative 0  0 - 1 %   Basophils Absolute 0.0  0.0 - 0.1  K/uL   WBC Morphology WHITE COUNT CONFIRMED ON SMEAR    BASIC METABOLIC PANEL     Status: Abnormal   Collection Time    04/18/13  9:03 PM      Result Value Range   Sodium 122 (*) 135 - 145 mEq/L   Potassium 4.2  3.5 - 5.1 mEq/L   Chloride 84 (*) 96 - 112 mEq/L   CO2 25  19 - 32 mEq/L   Glucose, Bld 114 (*) 70 - 99 mg/dL   BUN 7  6 - 23 mg/dL   Creatinine, Ser 7.82  0.50 - 1.10 mg/dL   Calcium 95.6  8.4 - 21.3 mg/dL   GFR calc non Af Amer >90  >90 mL/min   GFR calc Af Amer >90  >90 mL/min   Comment:            The eGFR has been calculated     using  the CKD EPI equation.     This calculation has not been     validated in all clinical     situations.     eGFR's persistently     <90 mL/min signify     possible Chronic Kidney Disease.  URINALYSIS, ROUTINE W REFLEX MICROSCOPIC     Status: Abnormal   Collection Time    04/18/13  9:35 PM      Result Value Range   Color, Urine YELLOW  YELLOW   APPearance CLEAR  CLEAR   Specific Gravity, Urine 1.004 (*) 1.005 - 1.030   pH 7.5  5.0 - 8.0   Glucose, UA NEGATIVE  NEGATIVE mg/dL   Hgb urine dipstick NEGATIVE  NEGATIVE   Bilirubin Urine NEGATIVE  NEGATIVE   Ketones, ur 15 (*) NEGATIVE mg/dL   Protein, ur NEGATIVE  NEGATIVE mg/dL   Urobilinogen, UA 0.2  0.0 - 1.0 mg/dL   Nitrite NEGATIVE  NEGATIVE   Leukocytes, UA NEGATIVE  NEGATIVE   Comment: MICROSCOPIC NOT DONE ON URINES WITH NEGATIVE PROTEIN, BLOOD, LEUKOCYTES, NITRITE, OR GLUCOSE <1000 mg/dL.  PROTIME-INR     Status: Abnormal   Collection Time    04/18/13 10:12 PM      Result Value Range   Prothrombin Time 22.5 (*) 11.6 - 15.2 seconds   INR 2.05 (*) 0.00 - 1.49  CBC     Status: Abnormal   Collection Time    04/19/13  4:55 AM      Result Value Range   WBC 5.1  4.0 - 10.5 K/uL   RBC 3.71 (*) 3.87 - 5.11 MIL/uL   Hemoglobin 11.5 (*) 12.0 - 15.0 g/dL   HCT 47.8 (*) 29.5 - 62.1 %   MCV 89.2  78.0 - 100.0 fL   MCH 31.0  26.0 - 34.0 pg   MCHC 34.7  30.0 - 36.0 g/dL   RDW 30.8   65.7 - 84.6 %   Platelets 150  150 - 400 K/uL  BASIC METABOLIC PANEL     Status: Abnormal   Collection Time    04/19/13  4:55 AM      Result Value Range   Sodium 127 (*) 135 - 145 mEq/L   Potassium 3.6  3.5 - 5.1 mEq/L   Chloride 93 (*) 96 - 112 mEq/L   CO2 24  19 - 32 mEq/L   Glucose, Bld 97  70 - 99 mg/dL   BUN 5 (*) 6 - 23 mg/dL   Creatinine, Ser 9.62  0.50 - 1.10 mg/dL   Calcium 8.7  8.4 - 95.2 mg/dL   GFR calc non Af Amer >90  >90 mL/min   GFR calc Af Amer >90  >90 mL/min   Comment:            The eGFR has been calculated     using the CKD EPI equation.     This calculation has not been     validated in all clinical     situations.     eGFR's persistently     <90 mL/min signify     possible Chronic Kidney Disease.  PROTIME-INR     Status: Abnormal   Collection Time    04/19/13  4:55 AM      Result Value Range   Prothrombin Time 28.3 (*) 11.6 - 15.2 seconds   INR 2.77 (*) 0.00 - 1.49    Dg Hip Complete Left  04/18/2013   *RADIOLOGY REPORT*  Clinical Data: Rate, felt  a pop in left hip when she turned, now unable to bear weight, hip prosthesis 12 years ago  LEFT HIP - COMPLETE 2+ VIEW  Comparison: Pelvic/right hip radiographs 04/15/2013  Findings: Acetabular and femoral components of bilateral hip prostheses identified without dislocation. Diffuse osseous demineralization. SI joints symmetric. Minimally distracted fracture of the left greater trochanter new since 04/15/2013. No additional fracture, dislocation, or bone destruction.  IMPRESSION: Avulsion fracture left greater trochanter. Osseous demineralization.   Original Report Authenticated By: Ulyses Southward, M.D.    Review of Systems  Musculoskeletal: Positive for joint pain.  All other systems reviewed and are negative.   Blood pressure 143/70, pulse 76, temperature 98.4 F (36.9 C), temperature source Oral, resp. rate 18, height 5\' 1"  (1.549 m), weight 64.2 kg (141 lb 8.6 oz), SpO2 99.00%. Physical Exam  Vitals  reviewed. Constitutional: She is oriented to person, place, and time. She appears well-developed.  HENT:  Head: Normocephalic.  Eyes: Pupils are equal, round, and reactive to light.  Neck: Normal range of motion.  Cardiovascular: Normal rate.   Respiratory: Effort normal.  GI: Soft.  Musculoskeletal:  Pain with palpation left trochanter. NVI. No DVT  Neurological: She is alert and oriented to person, place, and time.  Skin: Skin is warm and dry.  Psychiatric: She has a normal mood and affect.    Assessment/Plan: Left minimally displaced greater trochanter fracture with THR. Probably conservative treatment. NWB. Will have Dr. Roswell Miners see.  Devynn Scheff C 04/19/2013, 8:43 AM

## 2013-04-19 NOTE — Progress Notes (Addendum)
Clinical Social Work Department CLINICAL SOCIAL WORK PLACEMENT NOTE 04/19/2013  Patient:  [name], [name]  Account Number:  [id number] Admit date:  04/18/2013  Clinical Social Worker:  Doroteo Glassman  Date/time:  04/19/2013 12:36 PM  Clinical Social Work is seeking post-discharge placement for this patient at the following level of care:   SKILLED NURSING   (*CSW will update this form in Epic as items are completed)   04/19/2013  Patient/family provided with [facility] Department of Clinical Social Work's list of facilities offering this level of care within the geographic area requested by the patient (or if unable, by the patient's family).  04/19/2013  Patient/family informed of their freedom to choose among providers that offer the needed level of care, that participate in Medicare, Medicaid or managed care program needed by the patient, have an available bed and are willing to accept the patient.  04/19/2013  Patient/family informed of [facility]' ownership interest in [facility], as well as of the fact that they are under no obligation to receive care at this facility.  PASARR submitted to EDS on existing PASARR number received from EDS on existing  FL2 transmitted to all facilities in geographic area requested by pt/family on  04/19/2013 FL2 transmitted to all facilities within larger geographic area on   Patient informed that his/her managed care company has contracts with or will negotiate with  certain facilities, including the following:     Patient/family informed of bed offers received:  04/20/2013 Patient chooses bed at [facility] Physician recommends and patient chooses bed at    Patient to be transferred to  on  [facility] on 04/20/2013 Patient to be transferred to facility by pt daughter via private vehicle  The following physician request were entered in Epic:   Additional Comments:  Providence Crosby, LCSWA Clinical Social  Work [phone]   Jacklynn Lewis, MSW, LCSWA (coverage for [employer]) Clinical Social Work [phone]

## 2013-04-19 NOTE — H&P (Signed)
[facility] History and Physical  [name] ZOX:[id number] DOB: 1934-05-09 DOA: 04/18/2013  Referring physician: ED physician PCP: Kari Baars, MD   Chief Complaint: Fall and subsequent hip fracture   HPI:  Pt is 79 yo female who was transferred to [facility] hospital from [facility] for further management of left hip fracture. Pt came to ED after sustaining an episode of fall at home and in ED found to have left hip fracture. Pt denies any specific symptoms preceding the event. She now has left hip pain, constant and dull, radiating to left lower extremity, 10/10 in severity, worse with movement and no specific alleviating symptoms. Pt denies chest pain or shortness of breath, no focal neurological weakness, no numbness or tingling.   In ED, she was noted to have hyponatremia na 123, in addition to left hip fracture. Dr. August Saucer with ortho recommended tx to [facility] so that Dr. Despina Hick can operate on Monday.   Assessment and Plan: Principal Problem:   Hip fracture, left - plan for possible surgery on Monday per ortho team - will keep on telemetry floor  - will provide supportive care, IVF analgesia as needed - will need PT evaluation prior to discharge  Active Problems:   Hyponatremia - possibly related to dehydration in the setting of poor oral intake - continue IVF and repeat BMP in AM   HYPERTENSION - reasonable control on admission - continue home medical regimen with Norvasc, Bystolic, Micardis   Atrial fibrillation - rate controlled - continue Coumadin per pharmacy  - monitor on telemetry   Code Status: Full Family Communication: Pt at bedside Disposition Plan: Admit to telemetry bed   Review of Systems:  Constitutional: Negative for fever, chills and malaise/fatigue. Negative for diaphoresis.  HENT: Negative for hearing loss, ear pain, nosebleeds, congestion, sore throat, neck pain, tinnitus and ear discharge.   Eyes: Negative for blurred vision, double vision, photophobia, pain,  discharge and redness.  Respiratory: Negative for cough, hemoptysis, sputum production, shortness of breath, wheezing and stridor.   Cardiovascular: Negative for chest pain, palpitations, orthopnea, claudication and leg swelling.  Gastrointestinal: Negative for nausea, vomiting and abdominal pain. Negative for heartburn, constipation, blood in stool and melena.  Genitourinary: Negative for dysuria, urgency, frequency, hematuria and flank pain.  Musculoskeletal: Negative for myalgias, back pain, joint pain.  Skin: Negative for itching and rash.  Neurological: Negative for tingling, tremors, sensory change, speech change, focal weakness, loss of consciousness and headaches.  Endo/Heme/Allergies: Negative for environmental allergies and polydipsia. Does not bruise/bleed easily.  Psychiatric/Behavioral: Negative for suicidal ideas. The patient is not nervous/anxious.    Past Medical History  Diagnosis Date  . Cancer   . HTN (hypertension)   . Atrial tachycardia   . Atrial fibrillation     Past Surgical History  Procedure Laterality Date  . Total hip arthroplasty    . Mastectomy partial / lumpectomy w/ axillary lymphadenectomy  04/05/1987    Right - Dr Maple Hudson  . Breast biopsy  05/04/1987    Right Dr Maple Hudson    Social History:  reports that she has never smoked. She does not have any smokeless tobacco history on file. She reports that she does not drink alcohol or use illicit drugs.  Allergies  Allergen Reactions  . Aspirin     REACTION: rash    Family History  Problem Relation Age of Onset  . Cancer Mother   . Cancer Father   . Heart disease Father     Prior to Admission medications  Medication Sig Start Date End Date Taking? Authorizing Provider  amLODipine (NORVASC) 5 MG tablet Take 1 tablet (5 mg total) by mouth daily. 04/30/11 04/29/12  Peter M Sw[place], MD  Calcium Citrate (CITRACAL PO) Take 630 mg by mouth.      Historical Provider, MD  Cholecalciferol (VITAMIN D) 400 UNITS  capsule Take 400 Units by mouth daily.      Historical Provider, MD  Estradiol (VAGIFEM) 10 MCG TABS Place vaginally.      Historical Provider, MD  magnesium citrate solution Take 296 mLs by mouth once. 04/15/13   Charles B. Bernette Mayers, MD  nebivolol (BYSTOLIC) 5 MG tablet Take 5 mg by mouth daily.      Historical Provider, MD  Omega-3 Fatty Acids (FISH OIL) 1200 MG CAPS Take by mouth.      Historical Provider, MD  telmisartan (MICARDIS) 80 MG tablet Take 80 mg by mouth daily.      Historical Provider, MD  warfarin (COUMADIN) 4 MG tablet Take 4 mg by mouth daily.      Historical Provider, MD    Physical Exam: Filed Vitals:   04/18/13 1824 04/18/13 2240 04/19/13 0022  BP: 127/73 114/61 124/64  Pulse: 71    Temp: 98.9 F (37.2 C) 98.2 F (36.8 C) 98.2 F (36.8 C)  TempSrc: Oral Oral   Resp: 18 18 16   Height: 5\' 1"  (1.549 m)    Weight: 63.957 kg (141 lb)    SpO2: 99% 99% 98%    Physical Exam  Constitutional: Appears well-developed and well-nourished. No distress.  HENT: Normocephalic. External right and left ear normal. Oropharynx is clear and moist.  Eyes: Conjunctivae and EOM are normal. PERRLA, no scleral icterus.  Neck: Normal ROM. Neck supple. No JVD. No tracheal deviation. No thyromegaly.  CVS: IRRR, S1/S2 +, no murmurs, no gallops, no carotid bruit.  Pulmonary: Effort and breath sounds normal, no stridor, rhonchi, wheezes, rales.  Abdominal: Soft. BS +,  no distension, tenderness, rebound or guarding.  Musculoskeletal: Normal range of motion. Tenderness at the left hip area with limited range of motion due to pain  Lymphadenopathy: No lymphadenopathy noted, cervical, inguinal. Neuro: Alert. Normal reflexes, muscle tone coordination. No cranial nerve deficit. Skin: Skin is warm and dry. No rash noted. Not diaphoretic. No erythema. No pallor.  Psychiatric: Normal mood and affect. Behavior, judgment, thought content normal.   Labs on Admission:  Basic Metabolic Panel:  Recent  Labs Lab 04/15/13 0745 04/18/13 2103  NA 123* 122*  K 3.9 4.2  CL 86* 84*  CO2 25 25  GLUCOSE 111* 114*  BUN 10 7  CREATININE 0.60 0.50  CALCIUM 9.9 10.0   CBC:  Recent Labs Lab 04/15/13 0745 04/18/13 2103  WBC 4.6 8.2  NEUTROABS 3.5 7.0  HGB 13.2 13.6  HCT 37.0 37.7  MCV 89.2 88.7  PLT 134* PLATELET CLUMPS NOTED ON SMEAR, UNABLE TO ESTIMATE   Radiological Exams on Admission: Dg Hip Complete Left 04/18/2013  Avulsion fracture left greater trochanter. Osseous demineralization.     EKG: Normal sinus rhythm, no ST/T wave changes  Debbora Presto, MD  [facility] Pager [phone]  If 7PM-7AM, please contact night-coverage [email or web address] Password [facility] 04/19/2013, 12:33 AM

## 2013-04-19 NOTE — Progress Notes (Signed)
ANTICOAGULATION CONSULT NOTE - Follow Up Consult  Pharmacy Consult for Warfarin  Indication: atrial fibrillation  Allergies  Allergen Reactions  . Aspirin Rash    REACTION: rash   Labs:  Recent Labs  04/18/13 2103 04/18/13 2212 04/19/13 0455  HGB 13.6  --  11.5*  HCT 37.7  --  33.1*  PLT PLATELET CLUMPS NOTED ON SMEAR, UNABLE TO ESTIMATE  --  150  LABPROT  --  22.5* 28.3*  INR  --  2.05* 2.77*  CREATININE 0.50  --  0.50   Medications:  Scheduled:  . amLODipine  5 mg Oral Daily  . docusate sodium  100 mg Oral BID  . irbesartan  75 mg Oral Daily  . magnesium citrate  296 mL Oral Once  . nebivolol  5 mg Oral Daily    Assessment: 79 yr old female transferred from [facility] for management of left hip fracture Patient with h/o AFib and on warfarin 4mg  daily except for 6mg  on Mon/Wed PTA - last dose taken 7/5. Admit INR 2.05  Possible surgery Monday, await further ortho eval  INR = 2.77 today, remains in therapeutic range  Holding Warfarin for possible surgery  Goal of Therapy:  INR 2-3   Plan:  Hold Warfarin till surgery vs conservative mgt finalized Daily PT/INR  Otho Bellows PharmD Pager [phone] 04/19/2013, 12:19 PM

## 2013-04-19 NOTE — Consult Note (Signed)
  Called last night by EDP in [place]. Patient S/P THR by Dr. Wyline Mood. Sustained a minimally displaced Fracture of left greater trochanter. Wanted to see Alusio who sees her for her knees. I was unable to see patient due to Trauma at [facility] therefore I recommended patient be Admitted by Medicine for pain control and placement as this fracture should heal without surgery. Will inform Dr. Roswell Miners on Monday and Dr. Shon Baton who is on call today to see her. Nonweight bearing on left leg.  Dr. Shelle Iron [phone] cell

## 2013-04-19 NOTE — ED Notes (Signed)
[facility] at bedside preparing pt for transport ?

## 2013-04-19 NOTE — Progress Notes (Addendum)
ANTICOAGULATION CONSULT NOTE - Initial Consult  Pharmacy Consult for Warfarin Indication: atrial fibrillation  Allergies  Allergen Reactions  . Aspirin     REACTION: rash    Patient Measurements: Height: 5\' 1"  (154.9 cm) Weight: 141 lb 8.6 oz (64.2 kg) IBW/kg (Calculated) : 47.8  Vital Signs: Temp: 98.4 F (36.9 C) (07/06 0126) Temp src: Oral (07/06 0126) BP: 143/70 mmHg (07/06 0126) Pulse Rate: 76 (07/06 0126)  Labs:  Recent Labs  04/18/13 2103 04/18/13 2212  HGB 13.6  --   HCT 37.7  --   PLT PLATELET CLUMPS NOTED ON SMEAR, UNABLE TO ESTIMATE  --   LABPROT  --  22.5*  INR  --  2.05*  CREATININE 0.50  --     Estimated Creatinine Clearance: 51.4 ml/min (by C-G formula based on Cr of 0.5).   Medical History: Past Medical History  Diagnosis Date  . Cancer   . HTN (hypertension)   . Atrial tachycardia   . Atrial fibrillation     Medications:  Scheduled:  . amLODipine  5 mg Oral Daily  . docusate sodium  100 mg Oral BID  . irbesartan  75 mg Oral Daily  . magnesium citrate  296 mL Oral Once  . nebivolol  5 mg Oral Daily   Infusions:  . sodium chloride      Assessment:  79 yr old female transferred from [facility] for management of left hip fracture  Patient with h/o AFib and on warfarin 4mg  daily except for 6mg  on Mon/Wed PTA - last dose taken 7/5  H&P notes possible plan for surgery on Monday  Warfarin per pharmacy ordered by admitting physician  Spoke with Dr Izola Price - hold off on dosing warfarin until plans determined regarding OR   INR currently therapeutic (2.05)  Goal of Therapy:  INR 2-3   Plan:  No warfarin tonight Check daily PT/INR F/U plans on Sunday regarding OR on Monday vs continuing warfarin  Jamorian Dimaria, Joselyn Glassman, PharmD 04/19/2013,1:28 AM

## 2013-04-19 NOTE — Progress Notes (Signed)
Clinical Social Work Department BRIEF PSYCHOSOCIAL ASSESSMENT 04/19/2013  Patient:  [name], [name]     Account Number:  [id number]     Admit date:  04/18/2013  Clinical Social Worker:  Doroteo Glassman  Date/Time:  04/19/2013 12:34 PM  Referred by:  Physician  Date Referred:  04/19/2013 Referred for  SNF Placement   Other Referral:   Interview type:  Patient Other interview type:    PSYCHOSOCIAL DATA Living Status:  ALONE Admitted from facility:   Level of care:   Primary support name:  Sharen Counter Primary support relationship to patient:  CHILD, ADULT Degree of support available:   adequate    CURRENT CONCERNS Current Concerns  Post-Acute Placement   Other Concerns:    SOCIAL WORK ASSESSMENT / PLAN Met with Pt to discuss d/c plans.    Pt stated that she feels that she will need SNF upon d/c. Pt has been to [facility] in the past and would like to return there.    Pt gave CSW permission to send her information to [facility].    Weekday CSW to follow.    CSW thanked Pt for her time.   Assessment/plan status:  Psychosocial Support/Ongoing Assessment of Needs Other assessment/ plan:   Information/referral to community resources:   [facility] SNF list    PATIENT'S/FAMILY'S RESPONSE TO PLAN OF CARE: Pt is agreeable to SNF and is happy to receive rehab at [place].   Providence Crosby, LCSWA Clinical Social Work [phone]

## 2013-04-20 ENCOUNTER — Other Ambulatory Visit: Payer: Self-pay | Admitting: Geriatric Medicine

## 2013-04-20 ENCOUNTER — Other Ambulatory Visit: Payer: Medicare Other

## 2013-04-20 DIAGNOSIS — S7290XA Unspecified fracture of unspecified femur, initial encounter for closed fracture: Secondary | ICD-10-CM

## 2013-04-20 DIAGNOSIS — I4891 Unspecified atrial fibrillation: Secondary | ICD-10-CM

## 2013-04-20 LAB — BASIC METABOLIC PANEL
BUN: 6 mg/dL (ref 6–23)
Chloride: 99 mEq/L (ref 96–112)
Creatinine, Ser: 0.52 mg/dL (ref 0.50–1.10)
GFR calc Af Amer: >90 mL/min (ref >90)
GFR calc non Af Amer: >90 mL/min (ref >90)
Glucose, Bld: 93 mg/dL (ref 70–99)
Potassium: 3.6 mEq/L (ref 3.5–5.1)

## 2013-04-20 LAB — CBC
HCT: 34 % — ABNORMAL LOW (ref 36.0–46.0)
Hemoglobin: 11.5 g/dL — ABNORMAL LOW (ref 12.0–15.0)
MCH: 30.8 pg (ref 26.0–34.0)
MCHC: 33.8 g/dL (ref 30.0–36.0)
RDW: 14.5 % (ref 11.5–15.5)

## 2013-04-20 LAB — PROTIME-INR: Prothrombin Time: 24.8 seconds — ABNORMAL HIGH (ref 11.6–15.2)

## 2013-04-20 MED ORDER — WARFARIN SODIUM 6 MG PO TABS
6.0000 mg | ORAL_TABLET | Freq: Once | ORAL | Status: DC
  Filled 2013-04-20: qty 1

## 2013-04-20 MED ORDER — HYDROCODONE-ACETAMINOPHEN 5-325 MG PO TABS: 1.0000 | ORAL_TABLET | Freq: Four times a day (QID) | ORAL | Status: DC | PRN

## 2013-04-20 MED ORDER — TRAMADOL HCL 50 MG PO TABS: 50.0000 mg | ORAL_TABLET | Freq: Four times a day (QID) | ORAL | Status: DC | PRN

## 2013-04-20 MED ORDER — WARFARIN - PHARMACIST DOSING INPATIENT: Freq: Every day | Status: DC

## 2013-04-20 MED ORDER — DSS 100 MG PO CAPS: 100.0000 mg | ORAL_CAPSULE | Freq: Two times a day (BID) | ORAL | Status: DC

## 2013-04-20 NOTE — Progress Notes (Signed)
Pt for discharge to [facility].   CSW facilitated pt discharge needs including contacting facility, faxing pt discharge information via [facility], discussing with pt and pt daughter at bedside, providing RN phone number to call report, and providing discharge packet to pt daughter as pt daughter plans to transport via private vehicle and MD stated that transport by private vehicle was appropriate.  No further social work needs identified at this time.  CSW signing off.   Jacklynn Lewis, MSW, LCSWA (coverage for [employer]) Clinical Social Work [phone]

## 2013-04-20 NOTE — Discharge Summary (Signed)
Physician Discharge Summary  [name] ZOX:[id number] DOB: [birth year] DOA: 04/18/2013  PCP: Kari Baars, MD  Admit date: 04/18/2013 Discharge date: 04/20/2013  Time spent: Greater than 30 minutes  Recommendations for Outpatient Follow-up:  -Will be discharged to SNF today in stable condition. -Please note incidental pulmonary nodules on CT scan. With her history of remote breast cancer, should have PET scan to further evaluate and depending on results follow up with oncology.   Discharge Diagnoses:  Principal Problem:   Hip fracture, left Active Problems:   HYPERTENSION   Hyponatremia   Atrial fibrillation   Lung nodule   Discharge Condition: Stable and improved.  Filed Weights   04/18/13 1824 04/19/13 0126  Weight: 63.957 kg (141 lb) 64.2 kg (141 lb 8.6 oz)    History of present illness:  Patient is 79 yo female who was transferred to [facility] from [facility] for further management of left hip fracture. Pt came to ED after sustaining an episode of fall at home and in ED found to have left hip fracture. Pt denies any specific symptoms preceding the event. She now has left hip pain, constant and dull, radiating to left lower extremity, 10/10 in severity, worse with movement and no specific alleviating symptoms. Pt denies chest pain or shortness of breath, no focal neurological weakness, no numbness or tingling.  In ED, she was noted to have hyponatremia na 123, in addition to left hip fracture. We were asked to admit her for further evaluation and management.   Hospital Course:   Left Hip Fracture  -Per ortho, it appears no surgical intervention is required.  -Has had minimal pain today.  -Plan will be SNF at DC for ST-rehab.  Hyponatremia  -Resolved. -Na level is 130 on DC.  Atrial Fibrillation  -Rate controlled.  -On chronic anticoagulation with coumadin; pharmacy dosing.   Multiple Pulmonary Nodules  -Patient had a CT abdomen last week, where a right base lung nodule  was noted.  -Given her h/o breast cancer, a CT chest had been scheduled as an OP. -Since she was inpatient, we decided to perform in the hospital (results below). -Will need to schedule an OP PET scan and depending on results will likely need oncology follow up.  Procedures:  None   Consultations:  Orthopedics  Discharge Instructions  Discharge Orders   Future Appointments Provider Department Dept Phone   06/16/2013 10:30 AM Lauro Franklin, FNP [facility] [phone]   Future Orders Complete By Expires     Diet - low sodium heart healthy  As directed     Discontinue IV  As directed     Increase activity slowly  As directed         Medication List    STOP taking these medications       MACULAR VITAMIN BENEFIT PO      TAKE these medications       amLODipine 5 MG tablet  Commonly known as:  NORVASC  Take 5 mg by mouth every morning.     calcium-vitamin D 500-200 MG-UNIT per tablet  Commonly known as:  OSCAL WITH D  Take 1 tablet by mouth every morning.     DSS 100 MG Caps  Take 100 mg by mouth 2 (two) times daily.     HYDROcodone-acetaminophen 5-325 MG per tablet  Commonly known as:  NORCO/VICODIN  Take 1-2 tablets by mouth every 6 (six) hours as needed.     magnesium citrate solution  Take 296 mLs  by mouth once.     multivitamin with minerals Tabs  Take 1 tablet by mouth every evening.     nebivolol 5 MG tablet  Commonly known as:  BYSTOLIC  Take 5 mg by mouth every morning.     omega-3 acid ethyl esters 1 G capsule  Commonly known as:  LOVAZA  Take 1 g by mouth every morning.     telmisartan 80 MG tablet  Commonly known as:  MICARDIS  Take 80 mg by mouth every morning.     traMADol 50 MG tablet  Commonly known as:  ULTRAM  Take 1 tablet (50 mg total) by mouth every 6 (six) hours as needed.     VAGIFEM 10 MCG Tabs vaginal tablet  Generic drug:  Estradiol  Place 1 tablet vaginally 2 (two) times a week.     warfarin 4 MG  tablet  Commonly known as:  COUMADIN  Take 4-6 mg by mouth every evening. Take 6mg  Monday and Wednesday. Take 4mg  all the other days of the week.       Allergies  Allergen Reactions  . Aspirin Rash    REACTION: rash      The results of significant diagnostics from this hospitalization (including imaging, microbiology, ancillary and laboratory) are listed below for reference.    Significant Diagnostic Studies: Dg Hip Complete Left  04/18/2013   *RADIOLOGY REPORT*  Clinical Data: Rate, felt a pop in left hip when she turned, now unable to bear weight, hip prosthesis 12 years ago  LEFT HIP - COMPLETE 2+ VIEW  Comparison: Pelvic/right hip radiographs 04/15/2013  Findings: Acetabular and femoral components of bilateral hip prostheses identified without dislocation. Diffuse osseous demineralization. SI joints symmetric. Minimally distracted fracture of the left greater trochanter new since 04/15/2013. No additional fracture, dislocation, or bone destruction.  IMPRESSION: Avulsion fracture left greater trochanter. Osseous demineralization.   Original Report Authenticated By: Ulyses Southward, M.D.   Dg Hip Complete Right  04/15/2013   *RADIOLOGY REPORT*  Clinical Data: Larey Seat last Thursday, some right hip pain, history of bilateral hip replacements, the patient is on blood anticoagulation  RIGHT HIP - COMPLETE 2+ VIEW  Comparison: None.  Findings: Bilateral total hip replacements are noted.  No acute fracture is seen.  There is no evidence of prosthetic motion on the images obtained.  The pelvic rami are intact.  The SI joints appear normal.  The bones are somewhat osteopenic.  IMPRESSION: Bilateral total hip replacements.  No acute fracture.   Original Report Authenticated By: Dwyane Dee, M.D.   Ct Chest W Contrast  04/19/2013   *RADIOLOGY REPORT*  Clinical Data: Abnormal CT abdomen pelvis with nodule demonstrated in the right lung base.  History of breast cancer with surgery in 1988.  CT CHEST WITH CONTRAST   Technique:  Multidetector CT imaging of the chest was performed following the standard protocol during bolus administration of intravenous contrast.  Contrast: 80mL OMNIPAQUE IOHEXOL 300 MG/ML  SOLN  Comparison: CT abdomen and pelvis 04/15/2013.  Findings: There are 2 dominant nodules in the right lung base measuring about 9.6 mm diameter.  There is another right lung base nodule that measures about 12 mm diameter.  These were seen on the previous CT scan of the abdomen and pelvis.  There are multiple additional tiny nodules measuring less than 4 mm scattered throughout both lungs.  Given the history of neoplasm, changes could represent metastatic disease.  Consider further evaluation with PET CT scan versus followup CT in 3-6 months.  There are minimal bilateral pleural effusions with atelectasis or consolidation in the lung bases.  The scattered emphysematous changes.  Airways appear patent.  No pneumothorax.  Cardiac enlargement with particular prominence of the right heart. Normal caliber thoracic aorta with mild calcification.  No significant lymphadenopathy in the chest.  Esophagus is decompressed.  Postoperative changes with previous right mastectomy and surgical clips in the right axilla.  Calcifications in the right axilla may represent calcified lymph nodes.  Visualized portions of the upper abdominal organs again demonstrate multiple cysts in the liver and small sub centimeter focus of hyperenhancement in the posterior right lobe of the liver.  These changes are stable since the previous CT abdomen.  Suggestion of pancreatic ductal dilatation.  The degenerative changes throughout the thoracic spine.  No destructive bone lesions appreciated.  IMPRESSION: Multiple pulmonary nodules, largest in the right base measuring 9.6 and 12 mm.  Metastasis is not excluded.  Consider further evaluation with PET CT versus follow-up CT in 3-6 months.  Small bilateral pleural effusions with basilar atelectasis.  Cardiac  enlargement.   Original Report Authenticated By: Burman Nieves, M.D.   Ct Abdomen Pelvis W Contrast  04/15/2013   *RADIOLOGY REPORT*  Clinical Data: Recent fall with back and flank pain, the patient is on Coumadin  CT ABDOMEN AND PELVIS WITH CONTRAST  Technique:  Multidetector CT imaging of the abdomen and pelvis was performed following the standard protocol during bolus administration of intravenous contrast.  Contrast: 50mL OMNIPAQUE IOHEXOL 300 MG/ML  SOLN, OMNIPAQUE IOHEXOL 300 MG/ML  SOLN  Comparison: CT pelvis of 08/21/2007  Findings: On the lung window images, there is an 9 mm noncalcified nodule medially within the right lower lobe.  In addition there is a nodular opacity abutting the right hemidiaphragm probably representing pleural plaque.  CT of the chest is recommended to evaluate for possible malignancy.  There is mild cardiomegaly present.  Several well circumscribed low attenuation structures are scattered throughout the liver most consistent with simple cysts.  No hepatic ductal dilatation is noted.  The gallbladder is somewhat contracted and no calcified gallstones are seen.  The common bile duct is somewhat prominent near the ampulla measuring up to 9 mm.  Considerations are that of distal common bile duct stricture, calculus, or mass although no definite mass is seen.  MR with MRCP may be help to assess further and correlation with LFTs is recommended.  There is minimal prominence of the pancreatic duct but no pancreatic head lesion is evident.  The adrenal glands and spleen are unremarkable.  The stomach is not well distended but no abnormality is seen.  The kidneys enhance with no calculus or mass and no hydronephrosis is seen.  On delayed images, the pelvocaliceal systems and there is slight extrinsic indentation on the left renal pelvis probably by adjacent vessel, but no other abnormality is evident.  The proximal ureters are normal in caliber.  The abdominal aorta is normal in  caliber with mild to moderate atheromatous change present.  No adenopathy is seen.  Linear artifacts created by bilateral total hip replacements obscure some of the lower pelvis.  No obvious pelvic mass or hematoma is seen.  Urinary bladder cannot be well evaluated.  There is a moderate amount of feces throughout the colon.  The terminal ileum is unremarkable.  No free fluid is seen within the pelvis. No retroperitoneal hematoma is noted.  There is a mild lumbar scoliosis convex to the left with diffuse degenerative disc disease.  IMPRESSION:  1.  No retroperitoneal hematoma is seen. 2.  9 mm noncalcified right lower lobe lung nodule suspicious for primary or metastatic lung disease.  Also adjacent pleural plaque. Recommend CT of the chest to assess further. 3.  Prominent common bile duct.  Correlate with LFTs and consider MRI with MRCP. 4.  Bilateral total hip replacements.   Original Report Authenticated By: Dwyane Dee, M.D.    Microbiology: No results found for this or any previous visit (from the past 240 hour(s)).   Labs: Basic Metabolic Panel:  Recent Labs Lab 04/15/13 0745 04/18/13 2103 04/19/13 0455 04/20/13 0530  NA 123* 122* 127* 130*  K 3.9 4.2 3.6 3.6  CL 86* 84* 93* 99  CO2 25 25 24 23   GLUCOSE 111* 114* 97 93  BUN 10 7 5* 6  CREATININE 0.60 0.50 0.50 0.52  CALCIUM 9.9 10.0 8.7 8.1*   Liver Function Tests: No results found for this basename: AST, ALT, ALKPHOS, BILITOT, PROT, ALBUMIN,  in the last 168 hours No results found for this basename: LIPASE, AMYLASE,  in the last 168 hours No results found for this basename: AMMONIA,  in the last 168 hours CBC:  Recent Labs Lab 04/15/13 0745 04/18/13 2103 04/19/13 0455 04/20/13 0530  WBC 4.6 8.2 5.1 3.8*  NEUTROABS 3.5 7.0  --   --   HGB 13.2 13.6 11.5* 11.5*  HCT 37.0 37.7 33.1* 34.0*  MCV 89.2 88.7 89.2 91.2  PLT 134* PLATELET CLUMPS NOTED ON SMEAR, UNABLE TO ESTIMATE 150 148*   Cardiac Enzymes: No results found for  this basename: CKTOTAL, CKMB, CKMBINDEX, TROPONINI,  in the last 168 hours BNP: BNP (last 3 results) No results found for this basename: PROBNP,  in the last 8760 hours CBG: No results found for this basename: GLUCAP,  in the last 168 hours     Signed:  Chaya Jan  [facility] Pager: [phone] 04/20/2013, 10:57 AM

## 2013-04-20 NOTE — Progress Notes (Addendum)
Subjective: Left hip greater troch periprosthetic fx, minimally displaced  Pt's daughter with her this AM. She reports her pain is much better. She is able to lift the leg without pain and notes definite improvement. She is asking about possible D/C to SNF today.   Objective: Vital signs in last 24 hours: Temp:  [97.6 F (36.4 C)-98.2 F (36.8 C)] 97.6 F (36.4 C) (07/07 0504) Pulse Rate:  [58-62] 60 (07/07 0504) Resp:  [16-20] 16 (07/07 0504) BP: (120-126)/(59-72) 125/59 mmHg (07/07 0504) SpO2:  [97 %-99 %] 97 % (07/07 0504)  Intake/Output from previous day: 07/06 0701 - 07/07 0700 In: 2158.8 [P.O.:360; I.V.:1798.8] Out: 2900 [Urine:2900] Intake/Output this shift:     Recent Labs  04/18/13 2103 04/19/13 0455 04/20/13 0530  HGB 13.6 11.5* 11.5*    Recent Labs  04/19/13 0455 04/20/13 0530  WBC 5.1 3.8*  RBC 3.71* 3.73*  HCT 33.1* 34.0*  PLT 150 148*    Recent Labs  04/19/13 0455 04/20/13 0530  NA 127* 130*  K 3.6 3.6  CL 93* 99  CO2 24 23  BUN 5* 6  CREATININE 0.50 0.52  GLUCOSE 97 93  CALCIUM 8.7 8.1*    Recent Labs  04/19/13 0455 04/20/13 0530  INR 2.77* 2.33*    Neurologically intact ABD soft Neurovascular intact Sensation intact distally Intact pulses distally Dorsiflexion/Plantar flexion intact No cellulitis present Compartment soft No calf pain or sign of DVT  Assessment/Plan: L hip greater troch fx, minimally displaced Plan conservative tx, non-operative Remain NWB LLE Pt on coumadin PTA Orthopaedically stable for D/C- lives alone and may require short term rehab/SNF placement   BISSELL, JACLYN M. for Dr. Shelle Iron 04/20/2013, 7:28 AM

## 2013-04-20 NOTE — Progress Notes (Signed)
CSW met with pt and pt daughter at bedside to discuss SNF placement.  CSW notified pt that [facility] was able to offer pt a bed as that is where pt was interested in going for rehab per report.   Pt agreeable to placement at [facility].  CSW contacted [facility] who confirmed bed availability for today if pt medically stable.  CSW notified MD who anticipates pt will be medically ready for discharge today.   CSW to facilitate pt discharge needs when pt medically ready for discharge.   Jacklynn Lewis, MSW, LCSWA (coverage for [employer]) Clinical Social Work [phone]

## 2013-04-20 NOTE — Progress Notes (Signed)
Report called to Shrayll at [facility]. Maeola Harman

## 2013-04-20 NOTE — Progress Notes (Signed)
ANTICOAGULATION CONSULT NOTE - Follow Up  Pharmacy Consult for Warfarin  Indication: atrial fibrillation  Allergies  Allergen Reactions  . Aspirin Rash    REACTION: rash   Labs:  Recent Labs  04/18/13 2103 04/18/13 2212 04/19/13 0455 04/20/13 0530  HGB 13.6  --  11.5* 11.5*  HCT 37.7  --  33.1* 34.0*  PLT PLATELET CLUMPS NOTED ON SMEAR, UNABLE TO ESTIMATE  --  150 148*  LABPROT  --  22.5* 28.3* 24.8*  INR  --  2.05* 2.77* 2.33*  CREATININE 0.50  --  0.50 0.52   Medications:  Scheduled:  . amLODipine  5 mg Oral Daily  . docusate sodium  100 mg Oral BID  . irbesartan  75 mg Oral Daily  . magnesium citrate  296 mL Oral Once  . nebivolol  5 mg Oral Daily    Assessment: 79 yr old female transferred from [facility] on 7/6 for management of left hip fracture. Patient with h/o AFib and on warfarin 4mg  daily except for 6mg  on Mon/Wed. Last dose taken 7/5. Admit INR 2.05  Warfarin has been on hold since admission pending decision to go to OR or not (dose held 7/6). Decision now is not to go to the OR, ok to resume warfarin dosing per v.o. Dr. Ardyth Harps.  INR  remains in therapeutic range  No bleeding events reported in chart notes.  Goal of Therapy:  INR 2-3   Plan:  1) Warfarin 6mg  PO x1 at 18:00 2) Daily INR  Darrol Angel, PharmD Pager: [phone] 04/20/2013, 10:14 AM

## 2013-04-21 ENCOUNTER — Non-Acute Institutional Stay (SKILLED_NURSING_FACILITY): Payer: Medicare Other | Admitting: Adult Health

## 2013-04-21 DIAGNOSIS — S72009D Fracture of unspecified part of neck of unspecified femur, subsequent encounter for closed fracture with routine healing: Secondary | ICD-10-CM

## 2013-04-21 DIAGNOSIS — I4891 Unspecified atrial fibrillation: Secondary | ICD-10-CM

## 2013-04-21 DIAGNOSIS — M810 Age-related osteoporosis without current pathological fracture: Secondary | ICD-10-CM

## 2013-04-21 DIAGNOSIS — Z7901 Long term (current) use of anticoagulants: Secondary | ICD-10-CM

## 2013-04-21 DIAGNOSIS — S72002E Fracture of unspecified part of neck of left femur, subsequent encounter for open fracture type I or II with routine healing: Secondary | ICD-10-CM

## 2013-04-21 DIAGNOSIS — I10 Essential (primary) hypertension: Secondary | ICD-10-CM

## 2013-04-21 DIAGNOSIS — K5900 Constipation, unspecified: Secondary | ICD-10-CM

## 2013-04-23 ENCOUNTER — Other Ambulatory Visit (HOSPITAL_COMMUNITY): Payer: Self-pay | Admitting: Internal Medicine

## 2013-04-23 ENCOUNTER — Non-Acute Institutional Stay (SKILLED_NURSING_FACILITY): Payer: Medicare Other | Admitting: Internal Medicine

## 2013-04-23 DIAGNOSIS — I4891 Unspecified atrial fibrillation: Secondary | ICD-10-CM

## 2013-04-23 DIAGNOSIS — S72009S Fracture of unspecified part of neck of unspecified femur, sequela: Secondary | ICD-10-CM

## 2013-04-23 DIAGNOSIS — R911 Solitary pulmonary nodule: Secondary | ICD-10-CM

## 2013-04-23 DIAGNOSIS — I10 Essential (primary) hypertension: Secondary | ICD-10-CM

## 2013-04-23 DIAGNOSIS — J984 Other disorders of lung: Secondary | ICD-10-CM

## 2013-04-23 DIAGNOSIS — S72002S Fracture of unspecified part of neck of left femur, sequela: Secondary | ICD-10-CM

## 2013-04-29 ENCOUNTER — Encounter (HOSPITAL_COMMUNITY)
Admission: RE | Admit: 2013-04-29 | Discharge: 2013-04-29 | Disposition: A | Discharge status: Home / Self Care | Payer: Medicare Other | Source: Ambulatory Visit | Attending: Internal Medicine | Admitting: Internal Medicine

## 2013-04-29 DIAGNOSIS — Z853 Personal history of malignant neoplasm of breast: Secondary | ICD-10-CM | POA: Insufficient documentation

## 2013-04-29 DIAGNOSIS — J984 Other disorders of lung: Secondary | ICD-10-CM

## 2013-04-29 DIAGNOSIS — R918 Other nonspecific abnormal finding of lung field: Secondary | ICD-10-CM | POA: Insufficient documentation

## 2013-04-29 MED ORDER — FLUDEOXYGLUCOSE F - 18 (FDG) INJECTION
18.8000 | Freq: Once | INTRAVENOUS | Status: AC | PRN
  Administered 2013-04-29: 18.8 via INTRAVENOUS

## 2013-05-08 ENCOUNTER — Encounter: Payer: Self-pay | Admitting: Adult Health

## 2013-05-08 DIAGNOSIS — K5900 Constipation, unspecified: Secondary | ICD-10-CM | POA: Insufficient documentation

## 2013-05-08 DIAGNOSIS — K5901 Slow transit constipation: Secondary | ICD-10-CM | POA: Insufficient documentation

## 2013-05-08 NOTE — Progress Notes (Signed)
  Subjective:    Patient ID: [name], female    DOB: Feb 24, 1934, 79 y.o.   MRN: [id number]  HPI This is a 79 year old female who has been admitted to [facility] on 04/20/13 from [facility] with discharge diagnosis of left hip fracture. Per ortho, no surgical intervention is required. She is being admitted for a short-term rehabilitation to   Review of Systems  Constitutional: Negative.   Respiratory: Negative for cough and shortness of breath.   Cardiovascular: Negative for leg swelling.  Gastrointestinal: Negative.   Psychiatric/Behavioral: Negative.        Objective:   Physical Exam  Nursing note and vitals reviewed. Constitutional: She is oriented to person, place, and time. She appears well-developed and well-nourished.  HENT:  Head: Normocephalic and atraumatic.  Right Ear: External ear normal.  Left Ear: External ear normal.  Mouth/Throat: Oropharynx is clear and moist.  Neck: Normal range of motion. Neck supple.  Cardiovascular: Normal rate, normal heart sounds and intact distal pulses.   Irregularly irregular  Pulmonary/Chest: Effort normal and breath sounds normal.  Abdominal: Soft. Bowel sounds are normal.  Musculoskeletal: She exhibits no tenderness.  Neurological: She is alert and oriented to person, place, and time.  Skin: Skin is warm and dry.  Psychiatric: She has a normal mood and affect. Her behavior is normal. Judgment and thought content normal.    LABS/PROCEDURES: 04/20/13 sodium 1:30 potassium 3.6 glucose 93 BUN 6 creatinine 0.50 calcium 8.1 WBC 3.8 hemoglobin 11.5 hematocrit 34.0   Medications reviewed.    Assessment & Plan:   Constipation - stable  Hip fracture, left - for PT and OT  Hyponatremia - stable  Atrial fibrillation - rate controlled  HYPERTENSION - well controlled  Osteoporosis -  Start Fosamax 70 mg by mouth q. Sundays  Long term use of anticoagulant - INR 2.4 - therapeutic; continue Coumadin 6 mg by mouth q.  Monday and Wednesday; Coumadin 4 mg by mouth q. Tuesday, Thursdays, Fridays, Saturdays, and Sundays.

## 2013-05-09 DIAGNOSIS — M810 Age-related osteoporosis without current pathological fracture: Secondary | ICD-10-CM | POA: Insufficient documentation

## 2013-05-09 DIAGNOSIS — Z7901 Long term (current) use of anticoagulants: Secondary | ICD-10-CM | POA: Insufficient documentation

## 2013-05-12 ENCOUNTER — Non-Acute Institutional Stay (SKILLED_NURSING_FACILITY): Payer: Medicare Other | Admitting: Adult Health

## 2013-05-12 ENCOUNTER — Encounter: Payer: Self-pay | Admitting: Adult Health

## 2013-05-12 DIAGNOSIS — K5900 Constipation, unspecified: Secondary | ICD-10-CM

## 2013-05-12 DIAGNOSIS — I10 Essential (primary) hypertension: Secondary | ICD-10-CM

## 2013-05-12 DIAGNOSIS — M810 Age-related osteoporosis without current pathological fracture: Secondary | ICD-10-CM

## 2013-05-12 DIAGNOSIS — S72002D Fracture of unspecified part of neck of left femur, subsequent encounter for closed fracture with routine healing: Secondary | ICD-10-CM

## 2013-05-12 DIAGNOSIS — E871 Hypo-osmolality and hyponatremia: Secondary | ICD-10-CM

## 2013-05-12 DIAGNOSIS — S72009D Fracture of unspecified part of neck of unspecified femur, subsequent encounter for closed fracture with routine healing: Secondary | ICD-10-CM

## 2013-05-12 DIAGNOSIS — I4891 Unspecified atrial fibrillation: Secondary | ICD-10-CM

## 2013-05-12 NOTE — Progress Notes (Signed)
Patient ID: [name], female   DOB: 1934/02/17, 79 y.o.   MRN: [id number]       PROGRESS NOTE  DATE: 05/12/2013   FACILITY: [facility]  LEVEL OF CARE: SNF (31)  Discharge Visit  CHIEF COMPLAINT:  Manage  hip fracture,  Hypertension, hyponatremia, atrial fibrillation, constipation and osteoporosis  HISTORY OF PRESENT ILLNESS: I was requested by the social worker to perform face-to-face evaluation for discharge: She is a 79 years old female with left hip fracture with no surgical intervention per ortho  Discretion. Patient was admitted to this facility for short-term rehabilitation after the patient's recent hospitalization.  Patient has completed SNF rehabilitation and therapy has cleared the patient for discharge.  Reassessment of ongoing problem(s):  ATRIAL FIBRILLATION: the patients atrial fibrillation remains stable.  The patient denies DOE, tachycardia, orthopnea, transient neurological sx, pedal edema, palpitations, & PNDs.  No complications noted from the medications currently being used.  HTN: Pt 's HTN remains stable.  Denies CP, sob, DOE, pedal edema, headaches, dizziness or visual disturbances.  No complications from the medications currently being used.  Last BP : 144/85   PAST MEDICAL HISTORY : Reviewed.  No changes.  CURRENT MEDICATIONS: Reviewed per [facility]  REVIEW OF SYSTEMS:  GENERAL: no change in appetite, no fatigue, no weight changes, no fever, chills or weakness RESPIRATORY: no cough, SOB, DOE, wheezing, hemoptysis CARDIAC: no chest pain, edema or palpitations GI: no abdominal pain, diarrhea, constipation, heart burn, nausea or vomiting  PHYSICAL EXAMINATION  VS:  T 96.8       P 70       RR 20     BP 144/85      POX 95 %       WT 139.8  (Lb)  GENERAL: no acute distress, normal body habitus EYES: conjunctivae normal, sclerae normal, normal eye lids NECK: supple, trachea midline, no neck masses, no thyroid tenderness, no  thyromegaly LYMPHATICS: no LAN in the neck, no supraclavicular LAN RESPIRATORY: breathing is even & unlabored, BS CTAB CARDIAC: RRR, no murmur,no extra heart sounds, no edema GI: abdomen soft, normal BS, no masses, no tenderness, no hepatomegaly, no splenomegaly PSYCHIATRIC: the patient is alert & oriented to person, affect & behavior appropriate  LABS/RADIOLOGY: 04/20/13 sodium 1:30 potassium 3.6 glucose 93 BUN 6 creatinine 0.50 calcium 8.1 WBC 3.8 hemoglobin 11.5 hematocrit 34.0 04/24/13 WBC 5.5 hemoglobin 12.1 hematocrit 33.8 sodium 131 potassium 3.7 glucose 87 BUN 7 creatinine 0.60 calcium 8.7  ASSESSMENT/PLAN:  Constipation - stable  Hip fracture, left - for home health PT and Nursing  Hyponatremia - stable; bmp next lab draw and  Atrial fibrillation - rate controlled  HYPERTENSION - well controlled  Osteoporosis -  Stable   I have filled out patient's discharge paperwork and written prescriptions.  Patient will receive home health PT and Nursing   Total discharge time: Greater than 30 minutes Discharge time involved coordination of the discharge process with social worker, nursing staff and therapy department. Medical justification for home health services verified.    CPT CODE: 40981

## 2013-05-19 NOTE — Progress Notes (Signed)
Patient ID: [name], female   DOB: 1934/06/09, 79 y.o.   MRN: [id number]        HISTORY & PHYSICAL  DATE: 04/23/2013   FACILITY: [facility]  LEVEL OF CARE: SNF (31)  ALLERGIES:  Allergies  Allergen Reactions  . Aspirin Rash    REACTION: rash    CHIEF COMPLAINT:  Manage left hip fracture, atrial fibrillation, and hypertension.    HISTORY OF PRESENT ILLNESS:  The patient is a 79 year-old, Caucasian female.    HIP FRACTURE: The patient had a mechanical fall and sustained a femur fracture.  The patient did not require surgical intervention.  Patient is admitted to this facility for short-term rehabilitation. Patient denies hip pain currently. No complications reported from the pain medications currently being used.  ATRIAL FIBRILLATION: the patients atrial fibrillation remains stable.  The patient denies DOE, tachycardia, orthopnea, transient neurological sx, pedal edema, palpitations, & PNDs.  No complications noted from the medications currently being used.   HTN: Pt 's HTN remains stable.  Denies CP, sob, DOE, pedal edema, headaches, dizziness or visual disturbances.  No complications from the medications currently being used.  Last BP :  119/72, 118/66, 110/52.  PAST MEDICAL HISTORY :  Past Medical History  Diagnosis Date  . Cancer   . HTN (hypertension)   . Atrial tachycardia   . Atrial fibrillation     PAST SURGICAL HISTORY: Past Surgical History  Procedure Laterality Date  . Total hip arthroplasty    . Mastectomy partial / lumpectomy w/ axillary lymphadenectomy  04/05/1987    Right - Dr Maple Hudson  . Breast biopsy  05/04/1987    Right Dr Maple Hudson  . Joint replacement      SOCIAL HISTORY:  reports that she has never smoked. She does not have any smokeless tobacco history on file. She reports that she does not drink alcohol or use illicit drugs.  FAMILY HISTORY:  Family History  Problem Relation Age of Onset  . Cancer Mother   . Cancer Father   .  Heart disease Father     CURRENT MEDICATIONS: Reviewed per [facility]  REVIEW OF SYSTEMS:  See HPI otherwise 14 point ROS is negative.  PHYSICAL EXAMINATION  VS:  T 98.3       P 75      RR 20      BP 119/72      POX 99% room air        WT (Lb)  GENERAL: no acute distress, normal body habitus SKIN: warm & dry, no suspicious lesions or rashes, no excessive dryness EYES: conjunctivae normal, sclerae normal, normal eye lids MOUTH/THROAT: lips without lesions,no lesions in the mouth,tongue is without lesions,uvula elevates in midline NECK: supple, trachea midline, no neck masses, no thyroid tenderness, no thyromegaly LYMPHATICS: no LAN in the neck, no supraclavicular LAN RESPIRATORY: breathing is even & unlabored, BS CTAB CARDIAC: RRR, no murmur,no extra heart sounds, no edema GI:  ABDOMEN: abdomen soft, normal BS, no masses, no tenderness  LIVER/SPLEEN: no hepatomegaly, no splenomegaly MUSCULOSKELETAL: HEAD: normal to inspection & palpation BACK: no kyphosis, scoliosis or spinal processes tenderness EXTREMITIES: LEFT UPPER EXTREMITY: full range of motion, normal strength & tone RIGHT UPPER EXTREMITY:  full range of motion, normal strength & tone LEFT LOWER EXTREMITY:  full range of motion, normal strength & tone RIGHT LOWER EXTREMITY:  full range of motion, normal strength & tone PSYCHIATRIC: the patient is alert & oriented to person, affect & behavior  appropriate  LABS/RADIOLOGY: Left hip x-ray showed avulsion fracture of the left greater trochanter.    Right hip x-ray showed bilateral total hip replacement.    CT of the chest showed multiple pulmonary nodules.   CT of the abdomen and pelvis did not show any acute findings.    BMP normal.   Hemoglobin 11.5, MCV 91.2, platelets 148, WBC 3.8.    ASSESSMENT/PLAN:  Left hip fracture.  Nonsurgical management only.  Continue rehabilitation.   Atrial fibrillation.  Rate controlled.    Hypertension.  Well controlled.    Multiple  pulmonary nodules.  Schedule an outpatient PET scan for further evaluation.    Check CBC and BMP.   I have reviewed patient's medical records received at admission/from hospitalization.  CPT CODE: 16109

## 2013-06-16 ENCOUNTER — Ambulatory Visit: Payer: Self-pay | Admitting: Nurse Practitioner

## 2013-06-30 ENCOUNTER — Encounter: Payer: Self-pay | Admitting: Nurse Practitioner

## 2013-06-30 ENCOUNTER — Ambulatory Visit (INDEPENDENT_AMBULATORY_CARE_PROVIDER_SITE_OTHER): Payer: Medicare Other | Admitting: Nurse Practitioner

## 2013-06-30 VITALS — BP 120/74 | HR 72 | Resp 16

## 2013-06-30 DIAGNOSIS — Z01419 Encounter for gynecological examination (general) (routine) without abnormal findings: Secondary | ICD-10-CM

## 2013-06-30 MED ORDER — ESTRADIOL 10 MCG VA TABS: 1.0000 | ORAL_TABLET | VAGINAL | Status: DC

## 2013-06-30 NOTE — Progress Notes (Signed)
79 y.o. G3, P3. Widowed Caucasian Fe here for annual exam. She suffered atraumatic fall this summer and had a fracture  Of the left femur next to the total hip replacement.  She was in  rehab at [facility] for 4 weeks after the fall for PT.  No LMP recorded. Patient has had a hysterectomy.          Sexually active: no  The current method of family planning is Hysterectomy Exercising: yes  walking, weights 3-4x/wk Smoker:  no  Health Maintenance: Pap:  05/10/09 MMG:  03/31/13 normal Colonoscopy:  2009 repeat in 5 year will schedule later this year BMD:   8/14 showed a decrease in BMD and will be starting Reclast next week at PCP TDaP:  2011 Shingles about 2008 Pneumovax 2011 Labs: PCP   reports that she has never smoked. She does not have any smokeless tobacco history on file. She reports that she does not drink alcohol or use illicit drugs.  Past Medical History  Diagnosis Date  . HTN (hypertension)   . Atrial tachycardia   . Atrial fibrillation     on coumadin  . Cancer 1988    breast cancer, R mastectomy, chemo  . Hemorrhoids 1991  . Arthritis 1996  . Diverticulitis 1999  . Femur fracture 03/2013    left, non weight bearing for 2 1/2 weeks  . Urinary, incontinence, stress female   . Vitamin D deficiency disease     Past Surgical History  Procedure Laterality Date  . Total hip arthroplasty    . Mastectomy partial / lumpectomy w/ axillary lymphadenectomy  04/05/1987    Right - Dr Maple Hudson  . Breast biopsy  05/04/1987    Right Dr Maple Hudson  . Joint replacement      2002 left; 2009 right Hip  . Tubal ligation Bilateral 1970  . Vaginal hysterectomy  1977    myomata  . Bunionectomy  1988  . Breast implant removal Right 06/05/10  . Eye surgery  5/14 ; 6/14    Cataract Surgery    Current Outpatient Prescriptions  Medication Sig Dispense Refill  . amLODipine (NORVASC) 5 MG tablet Take 5 mg by mouth every morning.      . calcium-vitamin D (OSCAL WITH D) 500-200 MG-UNIT per  tablet Take 1 tablet by mouth every morning.      Tery Sanfilippo Sodium (DSS) 100 MG CAPS Take 100 mg by mouth once.      . Estradiol (VAGIFEM) 10 MCG TABS vaginal tablet Place 1 tablet (10 mcg total) vaginally 2 (two) times a week.  24 tablet  3  . Multiple Vitamin (MULTIVITAMIN WITH MINERALS) TABS Take 1 tablet by mouth every evening.      . nebivolol (BYSTOLIC) 5 MG tablet Take 10 mg by mouth every morning.       Marland Kitchen omega-3 acid ethyl esters (LOVAZA) 1 G capsule Take 1 g by mouth every morning.      Marland Kitchen telmisartan (MICARDIS) 80 MG tablet Take 80 mg by mouth every morning.      Marland Kitchen UNABLE TO FIND 2 (two) times daily. Med Name: Macular Degeneration Supplement      . warfarin (COUMADIN) 4 MG tablet Take 4-6 mg by mouth every evening. Take 6mg  Monday and Wednesday. Take 4mg  all the other days of the week.       No current facility-administered medications for this visit.    Family History  Problem Relation Age of Onset  . Colon cancer Mother   .  Colon cancer Father   . Heart disease Father     ROS:  Pertinent items are noted in HPI.  Otherwise, a comprehensive ROS was negative.  Exam:   BP 120/74  Pulse 72  Resp 16    Ht Readings from Last 3 Encounters:  04/19/13 5\' 1"  (1.549 m)  04/09/07 5\' 2"  (1.575 m)    General appearance: alert, cooperative and appears stated age Head: Normocephalic, without obvious abnormality, atraumatic Neck: no adenopathy, supple, symmetrical, trachea midline and thyroid normal to inspection and palpation Lungs: clear to auscultation bilaterally Breasts: normal appearance, no masses or tenderness, right mastectomy without implant Heart: regular rate and rhythm Abdomen: soft, non-tender; no masses,  no organomegaly Extremities: extremities normal, atraumatic, no cyanosis or edema Skin: Skin color, texture, turgor normal. No rashes or lesions Lymph nodes: Cervical, supraclavicular, and axillary nodes normal. No abnormal inguinal nodes palpated Neurologic:  Grossly normal   Pelvic: External genitalia:  no lesions              Urethra:  normal appearing urethra with no masses, tenderness or lesions              Bartholin's and Skene's: normal                 Vagina: normal appearing vagina with normal color and discharge, no lesions              Cervix: absent              Pap taken: no Bimanual Exam:  Uterus:  uterus absent              Adnexa: no mass, fullness, tenderness               Rectovaginal: Confirms               Anus:  normal sphincter tone, no lesions  A:  Well Woman with normal exam  S/P TVH secondary to fibroids 1977  S/P right mastectomy 1988 secondary to breast cancer with implant that was later removed  S/P bilateral hip replacements and right knee replacement  Recent fall with fracture of left femur 6/14  History of A Fib on Coumadin  Atrophic vaginitis with urinary incontinence that is stable on Vagifem  P:   Pap smear as per guidelines Not indicated  Mammogram due 6/15  Samples of Vagifem and written RX for this next year - she will reduce dose to every 4-5 days as long as urinary symptoms are relieved.  Patient is aware of potential risk including: CVA, DVT, cancer, etc   Oncologist is aware that she is on Vagifem and has allowed her stay on med's, PCP also aware as long as she is on Coumadin  Counseled on breast self exam, osteoporosis, adequate intake of calcium and vitamin D, diet and exercise, Kegel's exercises return annually or prn  An After Visit Summary was printed and given to the patient.

## 2013-06-30 NOTE — Patient Instructions (Signed)

## 2013-07-02 ENCOUNTER — Other Ambulatory Visit: Payer: Self-pay | Admitting: Dermatology

## 2013-07-08 ENCOUNTER — Other Ambulatory Visit (HOSPITAL_COMMUNITY): Payer: Self-pay | Admitting: Internal Medicine

## 2013-07-08 ENCOUNTER — Encounter: Payer: Self-pay | Admitting: Certified Nurse Midwife

## 2013-07-08 ENCOUNTER — Ambulatory Visit (HOSPITAL_COMMUNITY): Admission: RE | Admit: 2013-07-08 | Payer: Medicare Other | Source: Ambulatory Visit

## 2013-07-08 ENCOUNTER — Ambulatory Visit (INDEPENDENT_AMBULATORY_CARE_PROVIDER_SITE_OTHER): Payer: Medicare Other | Admitting: Certified Nurse Midwife

## 2013-07-08 VITALS — BP 110/64 | HR 64 | Resp 16 | Ht 61.0 in | Wt 138.0 lb

## 2013-07-08 DIAGNOSIS — N390 Urinary tract infection, site not specified: Secondary | ICD-10-CM

## 2013-07-08 LAB — POCT URINALYSIS DIPSTICK
Bilirubin, UA: NEGATIVE
Ketones, UA: NEGATIVE

## 2013-07-08 MED ORDER — CIPROFLOXACIN HCL 500 MG PO TABS: 250.0000 mg | ORAL_TABLET | Freq: Two times a day (BID) | ORAL | Status: DC

## 2013-07-08 NOTE — Progress Notes (Signed)
S:79 y.o.Widowed Caucasian female presents with UTI symptoms beginning onSeptember 23, 2014. Symptoms are  blood in urine, burning with urination, urinary frequency, occasional sporadic incontinence. Denies vaginal dryness symptoms or vaginal bleeding. Patient uses Vagifem for dryness.The patient is having no constitutional symptoms, denying fever, chills, back pain or headache. Last UTI documented was positive for Klebsiella in 2011.  ROS: feels well  O alert, oriented to person, place, and time   healthy,  alert,  not in acute distress, well developed and well nourished  Skin warm and dry  CVAT negative  Abdomen: positive suprapubic  Pelvic exam: External genital area atrophic appearance  Urethra, Urethral meatus, bladder all tender  Vagina: atrophic, but moisture present  Uterus/cervix not present  Adnexa: not tender, no masses    Poct urine-rbc 2+,wbc 2+  Assessment: UTI  P: Reviewed findings and need for medicaton. Rx Cipro see order Lab: Urine culture/micro  Maintain adequate hydration. Follow up if symptoms not improving, and as needed.    Rv 2 weeks for TOC with culture and micro and OV

## 2013-07-09 LAB — URINALYSIS, MICROSCOPIC ONLY
Bacteria, UA: NONE SEEN
Casts: NONE SEEN
Crystals: NONE SEEN

## 2013-07-09 LAB — URINE CULTURE
Colony Count: NO GROWTH
Organism ID, Bacteria: NO GROWTH

## 2013-07-09 NOTE — Progress Notes (Signed)
Note reviewed, agree with plan.  Kischa Altice, MD  

## 2013-07-13 NOTE — Progress Notes (Signed)
Encounter reviewed by Dr. Manuelito Poage Silva.  

## 2013-07-16 ENCOUNTER — Encounter (HOSPITAL_COMMUNITY): Payer: Self-pay

## 2013-07-16 ENCOUNTER — Ambulatory Visit (HOSPITAL_COMMUNITY)
Admission: RE | Admit: 2013-07-16 | Discharge: 2013-07-16 | Disposition: A | Discharge status: Home / Self Care | Payer: Medicare Other | Source: Ambulatory Visit | Attending: Internal Medicine | Admitting: Internal Medicine

## 2013-07-16 DIAGNOSIS — M810 Age-related osteoporosis without current pathological fracture: Secondary | ICD-10-CM | POA: Insufficient documentation

## 2013-07-16 HISTORY — DX: Age-related osteoporosis without current pathological fracture: M81.0

## 2013-07-16 MED ORDER — ZOLEDRONIC ACID 5 MG/100ML IV SOLN
5.0000 mg | Freq: Once | INTRAVENOUS | Status: AC
  Administered 2013-07-16: 5 mg via INTRAVENOUS
  Filled 2013-07-16: qty 100

## 2013-07-16 MED ORDER — SODIUM CHLORIDE 0.9 % IV SOLN
Freq: Once | INTRAVENOUS | Status: AC
  Administered 2013-07-16: 16:00:00 via INTRAVENOUS

## 2013-07-23 ENCOUNTER — Ambulatory Visit (INDEPENDENT_AMBULATORY_CARE_PROVIDER_SITE_OTHER): Payer: Medicare Other | Admitting: Certified Nurse Midwife

## 2013-07-23 ENCOUNTER — Encounter: Payer: Self-pay | Admitting: Certified Nurse Midwife

## 2013-07-23 VITALS — BP 122/80 | HR 62 | Resp 16 | Wt 141.0 lb

## 2013-07-23 DIAGNOSIS — N390 Urinary tract infection, site not specified: Secondary | ICD-10-CM

## 2013-07-23 LAB — POCT URINALYSIS DIPSTICK
Bilirubin, UA: NEGATIVE
Blood, UA: NEGATIVE
Glucose, UA: NEGATIVE
Ketones, UA: NEGATIVE
Nitrite, UA: NEGATIVE

## 2013-07-23 NOTE — Progress Notes (Signed)
79 y.o. widowed Caucasian female G3P3 here for follow up ofUTI treated with Cipro on  initiated on September 24,2014. Urine microscopic positive blood and WBC's, culture negative.. Completed all medication as directed.  Denies any symptoms of urinary frequency, burning,urgency, blood in urine or pain. No vaginal itching or dryness. Uses Vagifem for dryness. Patient does wears pads in daytime only.   O: Healthy WD,WN female Affect: Normal, orientation x 3 Skin:warm and dry Negative CVAT bilateral Abdomen:soft, nontender, negative suprapubic pain Pelvic exam:EXTERNAL GENITALIA: normal appearing vulva with no masses, tenderness or lesions, urethra,urethral meatus, and bladder non tender VAGINA: no abnormal discharge or lesions and atrophic but moist  POCT urine: negative  A: UTI Resolved  Atrophic vaginitis uses Vagifem   P: Discussed findings of negative urine and exam. Discussed not wearing pads unless needed due to increase of UTI. Also daily water intake should be at least 4 glasses. Continue Vagifem as prescribed.   Labs: repeat culture not needed   RV Prn

## 2013-07-30 NOTE — Progress Notes (Signed)
Note reviewed, agree with plan.  Jaquese Irving, MD  

## 2013-10-20 ENCOUNTER — Other Ambulatory Visit: Payer: Self-pay | Admitting: Internal Medicine

## 2013-10-20 DIAGNOSIS — R911 Solitary pulmonary nodule: Secondary | ICD-10-CM

## 2013-10-20 DIAGNOSIS — R17 Unspecified jaundice: Secondary | ICD-10-CM

## 2013-10-23 ENCOUNTER — Other Ambulatory Visit: Payer: Medicare Other

## 2013-10-26 ENCOUNTER — Ambulatory Visit
Admission: RE | Admit: 2013-10-26 | Discharge: 2013-10-26 | Disposition: A | Discharge status: Home / Self Care | Payer: Medicare Other | Source: Ambulatory Visit | Attending: Internal Medicine | Admitting: Internal Medicine

## 2013-10-26 DIAGNOSIS — R911 Solitary pulmonary nodule: Secondary | ICD-10-CM

## 2013-10-26 DIAGNOSIS — R17 Unspecified jaundice: Secondary | ICD-10-CM

## 2013-10-26 MED ORDER — IOHEXOL 300 MG/ML  SOLN
100.0000 mL | Freq: Once | INTRAMUSCULAR | Status: AC | PRN
  Administered 2013-10-26: 100 mL via INTRAVENOUS

## 2013-10-30 ENCOUNTER — Other Ambulatory Visit: Payer: Self-pay | Admitting: Internal Medicine

## 2013-10-30 DIAGNOSIS — K838 Other specified diseases of biliary tract: Secondary | ICD-10-CM

## 2013-11-10 ENCOUNTER — Ambulatory Visit
Admission: RE | Admit: 2013-11-10 | Discharge: 2013-11-10 | Disposition: A | Discharge status: Home / Self Care | Payer: Medicare Other | Source: Ambulatory Visit | Attending: Internal Medicine | Admitting: Internal Medicine

## 2013-11-10 DIAGNOSIS — K838 Other specified diseases of biliary tract: Secondary | ICD-10-CM

## 2013-11-10 MED ORDER — GADOBENATE DIMEGLUMINE 529 MG/ML IV SOLN
13.0000 mL | Freq: Once | INTRAVENOUS | Status: AC | PRN
  Administered 2013-11-10: 13 mL via INTRAVENOUS

## 2013-12-29 ENCOUNTER — Other Ambulatory Visit: Payer: Self-pay | Admitting: Gastroenterology

## 2014-04-20 ENCOUNTER — Other Ambulatory Visit: Payer: Self-pay | Admitting: Internal Medicine

## 2014-04-20 DIAGNOSIS — R911 Solitary pulmonary nodule: Secondary | ICD-10-CM

## 2014-04-26 ENCOUNTER — Ambulatory Visit
Admission: RE | Admit: 2014-04-26 | Discharge: 2014-04-26 | Disposition: A | Discharge status: Home / Self Care | Payer: Medicare Other | Source: Ambulatory Visit | Attending: Internal Medicine | Admitting: Internal Medicine

## 2014-04-26 DIAGNOSIS — R911 Solitary pulmonary nodule: Secondary | ICD-10-CM

## 2014-07-01 ENCOUNTER — Encounter: Payer: Self-pay | Admitting: Nurse Practitioner

## 2014-07-01 ENCOUNTER — Ambulatory Visit (INDEPENDENT_AMBULATORY_CARE_PROVIDER_SITE_OTHER): Payer: Medicare Other | Admitting: Nurse Practitioner

## 2014-07-01 VITALS — BP 120/70 | HR 60 | Ht 61.75 in | Wt 144.0 lb

## 2014-07-01 DIAGNOSIS — Z01419 Encounter for gynecological examination (general) (routine) without abnormal findings: Secondary | ICD-10-CM

## 2014-07-01 MED ORDER — ESTRADIOL 10 MCG VA TABS: 1.0000 | ORAL_TABLET | VAGINAL | Status: DC

## 2014-07-01 NOTE — Progress Notes (Signed)
Patient ID: [name], female   DOB: Apr 12, 1934, 80 y.o.   MRN: [id number] 80 y.o. G3P3 Widowed Caucasian Fe here for annual exam. Doing well on Vagifem with less UTI symptoms.   lst year was found to have a Right lung nodule.  Again this year it seems to be benign on Chest CT April 26, 2014.  Also had MRI of abdomen to evaluate common bile duct for a slight elevated bilirubin.  Patient's last menstrual period was 10/16/1975. Had [facility] for fibroids       Sexually active: No.  The current method of family planning is abstinence.    Exercising: Yes.    Home exercise routine includes walking 3-4 times per week. Smoker:  no  Health Maintenance: Pap:  05/10/09, WNL MMG:  04/02/14, 3D, Bi-Rads 2: benign findings Colonoscopy:  01/2014, small polyp, no repeat due to age unless problems BMD:   8/14 showed a decrease in BMD, Reclast is due this month. PCP manages TDaP:  2011 Pneumovax:  2011 Shingles: about 2008 Labs: PCP   reports that she has never smoked. She has never used smokeless tobacco. She reports that she does not drink alcohol or use illicit drugs.  Past Medical History  Diagnosis Date  . HTN (hypertension)   . Atrial tachycardia   . Atrial fibrillation     on coumadin  . Cancer 1988    breast cancer, R mastectomy, chemo  . Hemorrhoids 1991  . Arthritis 1996  . Diverticulitis 1999  . Femur fracture 03/2013    left, non weight bearing for 2 1/2 weeks  . Urinary, incontinence, stress female   . Vitamin D deficiency disease   . Osteoporosis     Past Surgical History  Procedure Laterality Date  . Total hip arthroplasty    . Mastectomy partial / lumpectomy w/ axillary lymphadenectomy  04/05/1987    Right - Dr Annamaria Boots  . Breast biopsy  05/04/1987    Right Dr Annamaria Boots  . Tubal ligation Bilateral 1970  . Vaginal hysterectomy  1977    myomata  . Bunionectomy  1988  . Breast implant removal Right 06/05/10  . Eye surgery  5/14 ; 6/14    Cataract Surgery  . Joint replacement      2002  left; 2009 right Hip, rt knee 2012    Current Outpatient Prescriptions  Medication Sig Dispense Refill  . amLODipine (NORVASC) 5 MG tablet Take 5 mg by mouth every morning.      . Calcium Carbonate-Vitamin D (CALTRATE 600+D) 600-400 MG-UNIT per tablet Take 1 tablet by mouth daily.      . Estradiol (VAGIFEM) 10 MCG TABS vaginal tablet Place 1 tablet (10 mcg total) vaginally 2 (two) times a week.  24 tablet  3  . Multiple Vitamin (MULTIVITAMIN WITH MINERALS) TABS Take 1 tablet by mouth every evening.      . nebivolol (BYSTOLIC) 5 MG tablet Take 10 mg by mouth every morning.       . Omega-3 Fatty Acids (FISH OIL) 1000 MG CAPS Take 1 capsule by mouth 2 (two) times daily.      Marland Kitchen OVER THE COUNTER MEDICATION Macular degeneration supplement.  2 times daily.  Receives from Dr. Payton Emerald office      . telmisartan (MICARDIS) 80 MG tablet Take 80 mg by mouth every morning.      Marland Kitchen UNABLE TO FIND 2 (two) times daily. Med Name: Macular Degeneration Supplement      . warfarin (COUMADIN) 4 MG tablet  Take 4-6 mg by mouth every evening. Take 84m Monday and Wednesday. Take 427mall the other days of the week.       No current facility-administered medications for this visit.    Family History  Problem Relation Age of Onset  . Colon cancer Mother   . Colon cancer Father   . Heart disease Father     ROS:  Pertinent items are noted in HPI.  Otherwise, a comprehensive ROS was negative.  Exam:   BP 120/70  Pulse 60  Ht 5' 1.75" (1.568 m)  Wt 144 lb (65.318 kg)  BMI 26.57 kg/m2  LMP 10/16/1975 Height: 5' 1.75" (156.8 cm) (with shoes)  Ht Readings from Last 3 Encounters:  07/01/14 5' 1.75" (1.568 m)  07/08/13 '5\' 1"'  (1.549 m)  04/19/13 '5\' 1"'  (1.549 m)    General appearance: alert, cooperative and appears stated age Head: Normocephalic, without obvious abnormality, atraumatic Neck: no adenopathy, supple, symmetrical, trachea midline and thyroid normal to inspection and palpation Lungs: clear to  auscultation bilaterally Breasts: normal appearance, no masses or tenderness, on the left with right mastectomy without implant Heart: regular rate and rhythm Abdomen: soft, non-tender; no masses,  no organomegaly Extremities: extremities normal, atraumatic, no cyanosis or edema Skin: Skin color, texture, turgor normal. No rashes or lesions Lymph nodes: Cervical, supraclavicular, and axillary nodes normal. No abnormal inguinal nodes palpated Neurologic: Grossly normal   Pelvic: External genitalia:  no lesions              Urethra:  normal appearing urethra with no masses, tenderness or lesions              Bartholin's and Skene's: normal                 Vagina: normal appearing vagina with normal color and discharge, no lesions              Cervix: absent              Pap taken: No. Bimanual Exam:  Uterus:  uterus absent              Adnexa: no mass, fullness, tenderness               Rectovaginal: Confirms               Anus:  normal sphincter tone, no lesions  A:  Well Woman with normal exam  S/P TVH secondary to fibroids 1977   S/P right mastectomy 1988 secondary to breast cancer with implant that was later removed 05/2010  S/P bilateral hip replacements and right knee replacement   Fall with fracture of left femur 6/14 - now on Reclast  History of Osteoporosis   History of A Fib on Coumadin since 2012  Atrophic vaginitis with urinary incontinence that is stable on Vagifem  Vit D deficiency   P:   Reviewed health and wellness pertinent to exam  Pap smear not taken today  Mammogram is due 6/16  Refill Vagifem for a year.  She is aware that if she comes off Coumadin will need to also stop Vagifem  Counseled with risk of DVT, CVA, cancer, etc.  Counseled on breast self exam, mammography screening, adequate intake of calcium and vitamin D, diet and exercise return annually or prn  An After Visit Summary was printed and given to the patient.

## 2014-07-01 NOTE — Patient Instructions (Signed)

## 2014-07-06 NOTE — Progress Notes (Signed)
Encounter reviewed by Dr. Josefa Half.  I would recommend discontinuation of estrogen treatment.

## 2014-07-29 ENCOUNTER — Other Ambulatory Visit (HOSPITAL_COMMUNITY): Payer: Self-pay | Admitting: Internal Medicine

## 2014-07-29 ENCOUNTER — Encounter (HOSPITAL_COMMUNITY): Payer: Self-pay

## 2014-07-29 ENCOUNTER — Ambulatory Visit (HOSPITAL_COMMUNITY)
Admission: RE | Admit: 2014-07-29 | Discharge: 2014-07-29 | Disposition: A | Discharge status: Home / Self Care | Payer: Medicare Other | Source: Ambulatory Visit | Attending: Internal Medicine | Admitting: Internal Medicine

## 2014-07-29 DIAGNOSIS — M810 Age-related osteoporosis without current pathological fracture: Secondary | ICD-10-CM | POA: Diagnosis present

## 2014-07-29 MED ORDER — ZOLEDRONIC ACID 5 MG/100ML IV SOLN
5.0000 mg | Freq: Once | INTRAVENOUS | Status: AC
  Administered 2014-07-29: 5 mg via INTRAVENOUS
  Filled 2014-07-29: qty 100

## 2014-07-29 MED ORDER — SODIUM CHLORIDE 0.9 % IV SOLN
Freq: Once | INTRAVENOUS | Status: AC
  Administered 2014-07-29: 12:00:00 via INTRAVENOUS

## 2014-07-29 NOTE — Progress Notes (Signed)
Tolerated Rellast w/o any immediate reactions

## 2014-08-16 ENCOUNTER — Encounter (HOSPITAL_COMMUNITY): Payer: Self-pay

## 2014-10-24 ENCOUNTER — Encounter (HOSPITAL_BASED_OUTPATIENT_CLINIC_OR_DEPARTMENT_OTHER): Payer: Self-pay | Admitting: Emergency Medicine

## 2014-10-24 ENCOUNTER — Emergency Department (HOSPITAL_BASED_OUTPATIENT_CLINIC_OR_DEPARTMENT_OTHER): Payer: Medicare Other

## 2014-10-24 ENCOUNTER — Emergency Department (HOSPITAL_BASED_OUTPATIENT_CLINIC_OR_DEPARTMENT_OTHER)
Admission: EM | Admit: 2014-10-24 | Discharge: 2014-10-24 | Disposition: A | Discharge status: Home / Self Care | Payer: Medicare Other | Attending: Emergency Medicine | Admitting: Emergency Medicine

## 2014-10-24 DIAGNOSIS — Z8719 Personal history of other diseases of the digestive system: Secondary | ICD-10-CM | POA: Diagnosis not present

## 2014-10-24 DIAGNOSIS — L03113 Cellulitis of right upper limb: Secondary | ICD-10-CM | POA: Diagnosis not present

## 2014-10-24 DIAGNOSIS — I10 Essential (primary) hypertension: Secondary | ICD-10-CM | POA: Diagnosis not present

## 2014-10-24 DIAGNOSIS — I4891 Unspecified atrial fibrillation: Secondary | ICD-10-CM | POA: Insufficient documentation

## 2014-10-24 DIAGNOSIS — Z8781 Personal history of (healed) traumatic fracture: Secondary | ICD-10-CM | POA: Diagnosis not present

## 2014-10-24 DIAGNOSIS — M1990 Unspecified osteoarthritis, unspecified site: Secondary | ICD-10-CM | POA: Insufficient documentation

## 2014-10-24 DIAGNOSIS — Z7901 Long term (current) use of anticoagulants: Secondary | ICD-10-CM | POA: Insufficient documentation

## 2014-10-24 DIAGNOSIS — Z853 Personal history of malignant neoplasm of breast: Secondary | ICD-10-CM | POA: Insufficient documentation

## 2014-10-24 DIAGNOSIS — R2231 Localized swelling, mass and lump, right upper limb: Secondary | ICD-10-CM | POA: Diagnosis present

## 2014-10-24 DIAGNOSIS — R609 Edema, unspecified: Secondary | ICD-10-CM

## 2014-10-24 DIAGNOSIS — I890 Lymphedema, not elsewhere classified: Secondary | ICD-10-CM | POA: Diagnosis not present

## 2014-10-24 DIAGNOSIS — Z79899 Other long term (current) drug therapy: Secondary | ICD-10-CM | POA: Insufficient documentation

## 2014-10-24 DIAGNOSIS — Z87448 Personal history of other diseases of urinary system: Secondary | ICD-10-CM | POA: Insufficient documentation

## 2014-10-24 LAB — BASIC METABOLIC PANEL
Anion gap: 8 (ref 5–15)
BUN: 10 mg/dL (ref 6–23)
CHLORIDE: 95 meq/L — AB (ref 96–112)
CO2: 26 mmol/L (ref 19–32)
CREATININE: 0.57 mg/dL (ref 0.50–1.10)
Calcium: 9.7 mg/dL (ref 8.4–10.5)
GFR calc Af Amer: >90 mL/min (ref >90)
GFR, EST NON AFRICAN AMERICAN: 87 mL/min — AB (ref >90)
Glucose, Bld: 118 mg/dL — ABNORMAL HIGH (ref 70–99)
Potassium: 3.8 mmol/L (ref 3.5–5.1)
SODIUM: 129 mmol/L — AB (ref 135–145)

## 2014-10-24 LAB — CBC WITH DIFFERENTIAL/PLATELET
BASOS PCT: 0 % (ref 0–1)
Basophils Absolute: 0 10*3/uL (ref 0.0–0.1)
Eosinophils Absolute: 0 10*3/uL (ref 0.0–0.7)
Eosinophils Relative: 0 % (ref 0–5)
HEMATOCRIT: 42.5 % (ref 36.0–46.0)
Hemoglobin: 14.2 g/dL (ref 12.0–15.0)
Lymphocytes Relative: 4 % — ABNORMAL LOW (ref 12–46)
Lymphs Abs: 0.3 10*3/uL — ABNORMAL LOW (ref 0.7–4.0)
MCH: 31.7 pg (ref 26.0–34.0)
MCHC: 33.4 g/dL (ref 30.0–36.0)
MCV: 94.9 fL (ref 78.0–100.0)
MONO ABS: 0.4 10*3/uL (ref 0.1–1.0)
Monocytes Relative: 4 % (ref 3–12)
Neutro Abs: 8.1 10*3/uL — ABNORMAL HIGH (ref 1.7–7.7)
Neutrophils Relative %: 92 % — ABNORMAL HIGH (ref 43–77)
PLATELETS: 134 10*3/uL — AB (ref 150–400)
RBC: 4.48 MIL/uL (ref 3.87–5.11)
RDW: 13.9 % (ref 11.5–15.5)
WBC: 8.8 10*3/uL (ref 4.0–10.5)

## 2014-10-24 LAB — PROTIME-INR
INR: 3.99 — AB (ref 0.00–1.49)
PROTHROMBIN TIME: 38.9 s — AB (ref 11.6–15.2)

## 2014-10-24 MED ORDER — DOXYCYCLINE HYCLATE 100 MG PO CAPS: 100.0000 mg | ORAL_CAPSULE | Freq: Two times a day (BID) | ORAL | Status: DC

## 2014-10-24 MED ORDER — CEFTRIAXONE SODIUM 1 G IJ SOLR
INTRAMUSCULAR | Status: AC
  Filled 2014-10-24: qty 10

## 2014-10-24 MED ORDER — SODIUM CHLORIDE 0.9 % IV BOLUS (SEPSIS)
500.0000 mL | Freq: Once | INTRAVENOUS | Status: AC
  Administered 2014-10-24: 500 mL via INTRAVENOUS

## 2014-10-24 MED ORDER — DEXTROSE 5 % IV SOLN
1.0000 g | Freq: Once | INTRAVENOUS | Status: AC
  Administered 2014-10-24: 1 g via INTRAVENOUS

## 2014-10-24 NOTE — Discharge Instructions (Signed)
Please return if you are worse at any time including worsening redness, fever, or unable to tolerate medications. Check with your Dr. tomorrow. Do not take Coumadin until be seen by your physician. Your INR was elevated at 3.99 today and the antibiotic can make it more elevated.  Cellulitis Cellulitis is an infection of the skin and the tissue under the skin. The infected area is usually red and tender. This happens most often in the arms and lower legs. HOME CARE   Take your antibiotic medicine as told. Finish the medicine even if you start to feel better.  Keep the infected arm or leg raised (elevated).  Put a warm cloth on the area up to 4 times per day.  Only take medicines as told by your doctor.  Keep all doctor visits as told. GET HELP IF:  You see red streaks on the skin coming from the infected area.  Your red area gets bigger or turns a dark color.  Your bone or joint under the infected area is painful after the skin heals.  Your infection comes back in the same area or different area.  You have a puffy (swollen) bump in the infected area.  You have new symptoms.  You have a fever. GET HELP RIGHT AWAY IF:   You feel very sleepy.  You throw up (vomit) or have watery poop (diarrhea).  You feel sick and have muscle aches and pains. MAKE SURE YOU:   Understand these instructions.  Will watch your condition.  Will get help right away if you are not doing well or get worse. Document Released: 03/19/2008 Document Revised: 02/15/2014 Document Reviewed: 12/17/2011 [facility] Patient Information 2015 [place], [place]. This information is not intended to replace advice given to you by your health care provider. Make sure you discuss any questions you have with your health care provider.

## 2014-10-24 NOTE — ED Notes (Signed)
PT presents to ED with complaints of right arm swelling. PT states she had lymph nodes removed 27 yrs ago.

## 2014-10-24 NOTE — ED Provider Notes (Signed)
CSN: [id number]     Arrival date & time 10/24/14  1204 History   First MD Initiated Contact with Patient 10/24/14 1303     Chief Complaint  Patient presents with  . Arm Swelling     (Consider location/radiation/quality/duration/timing/severity/associated sxs/prior Treatment) HPI 80 y.o. Female with history of right axillary lymph node dissection and partial mastectomy 1988 with intermittent rue edema presents today stating edema worsened last night and has right upper extremity redness. She has had similar episode in the past and was treated for rue cellulitis by Dr. Margot Chimes last in 2012 and treated with doxycycline outpatient.  Patient denies fever, chills , nausea, or vomiting.   Past Medical History  Diagnosis Date  . HTN (hypertension)   . Atrial tachycardia   . Atrial fibrillation     on coumadin  . Cancer 1988    breast cancer, R mastectomy, chemo  . Hemorrhoids 1991  . Arthritis 1996  . Diverticulitis 1999  . Femur fracture 03/2013    left, non weight bearing for 2 1/2 weeks  . Urinary, incontinence, stress female   . Vitamin D deficiency disease   . Osteoporosis    Past Surgical History  Procedure Laterality Date  . Total hip arthroplasty    . Mastectomy partial / lumpectomy w/ axillary lymphadenectomy  04/05/1987    Right - Dr Annamaria Boots  . Breast biopsy  05/04/1987    Right Dr Annamaria Boots  . Tubal ligation Bilateral 1970  . Vaginal hysterectomy  1977    myomata  . Bunionectomy  1988  . Breast implant removal Right 06/05/10  . Eye surgery  5/14 ; 6/14    Cataract Surgery  . Joint replacement      2002 left; 2009 right Hip, rt knee 2012   Family History  Problem Relation Age of Onset  . Colon cancer Mother   . Colon cancer Father   . Heart disease Father    History  Substance Use Topics  . Smoking status: Never Smoker   . Smokeless tobacco: Never Used  . Alcohol Use: No   OB History    Gravida Para Term Preterm AB TAB SAB Ectopic Multiple Living   3 3        3        Review of Systems  All other systems reviewed and are negative.     Allergies  Aspirin  Home Medications   Prior to Admission medications   Medication Sig Start Date End Date Taking? Authorizing Provider  amLODipine (NORVASC) 5 MG tablet Take 5 mg by mouth every morning.    Historical Provider, MD  Calcium Carbonate-Vitamin D (CALTRATE 600+D) 600-400 MG-UNIT per tablet Take 1 tablet by mouth daily.    Historical Provider, MD  Estradiol (VAGIFEM) 10 MCG TABS vaginal tablet Place 1 tablet (10 mcg total) vaginally 2 (two) times a week. 07/01/14   Milford Cage, FNP  Multiple Vitamin (MULTIVITAMIN WITH MINERALS) TABS Take 1 tablet by mouth every evening.    Historical Provider, MD  nebivolol (BYSTOLIC) 5 MG tablet Take 10 mg by mouth every morning.     Historical Provider, MD  Omega-3 Fatty Acids (FISH OIL) 1000 MG CAPS Take 1 capsule by mouth 2 (two) times daily.    Historical Provider, MD  OVER THE COUNTER MEDICATION Macular degeneration supplement.  2 times daily.  Receives from Dr. Payton Emerald office    Historical Provider, MD  telmisartan (MICARDIS) 80 MG tablet Take 80 mg by mouth every morning.  Historical Provider, MD  UNABLE TO FIND 2 (two) times daily. Med Name: Macular Degeneration Supplement    Historical Provider, MD  warfarin (COUMADIN) 4 MG tablet Take 4-6 mg by mouth every evening. Take 6mg  Monday and Wednesday. Take 4mg  all the other days of the week.    Historical Provider, MD   BP 140/58 mmHg  Pulse 64  Temp(Src) 98.9 F (37.2 C) (Oral)  Resp 18  Ht 5\' 1"  (1.549 m)  Wt 140 lb (63.504 kg)  BMI 26.47 kg/m2  SpO2 99%  LMP 10/16/1975 Physical Exam  Constitutional: She is oriented to person, place, and time. She appears well-developed and well-nourished.  HENT:  Head: Normocephalic and atraumatic.  Eyes: Conjunctivae and EOM are normal. Pupils are equal, round, and reactive to light.  Neck: Normal range of motion. Neck supple.  Cardiovascular: An irregularly  irregular rhythm present.  Pulmonary/Chest: Effort normal and breath sounds normal.  Abdominal: Soft. Bowel sounds are normal.  Musculoskeletal: Normal range of motion. She exhibits edema.       Arms: Erythema and edema right upper extremity.  Neurological: She is alert and oriented to person, place, and time.  Skin: There is erythema.  Psychiatric: She has a normal mood and affect. Her behavior is normal. Thought content normal.  Nursing note and vitals reviewed.   ED Course  Procedures (including critical care time) Labs Review Labs Reviewed  CBC WITH DIFFERENTIAL - Abnormal; Notable for the following:    Platelets 134 (*)    Neutrophils Relative % 92 (*)    Neutro Abs 8.1 (*)    Lymphocytes Relative 4 (*)    Lymphs Abs 0.3 (*)    All other components within normal limits  PROTIME-INR - Abnormal; Notable for the following:    Prothrombin Time 38.9 (*)    INR 3.99 (*)    All other components within normal limits  BASIC METABOLIC PANEL - Abnormal; Notable for the following:    Sodium 129 (*)    Chloride 95 (*)    Glucose, Bld 118 (*)    GFR calc non Af Amer 87 (*)    All other components within normal limits    Imaging Review US Venous Img Upper Uni Right  10/24/2014   CLINICAL DATA:  Right upper extremity lymphedema. History of cellulitis involving the right arm. History of right-sided breast cancer. Evaluate for DVT.  EXAM: RIGHT UPPER EXTREMITY VENOUS DOPPLER ULTRASOUND  TECHNIQUE: Gray-scale sonography with graded compression, as well as color Doppler and duplex ultrasound were performed to evaluate the upper extremity deep venous system from the level of the subclavian vein and including the jugular, axillary, basilic, radial, ulnar and upper cephalic vein. Spectral Doppler was utilized to evaluate flow at rest and with distal augmentation maneuvers.  COMPARISON:  None.  FINDINGS: Contralateral Subclavian Vein: Respiratory phasicity is normal and symmetric with the  symptomatic side. No evidence of thrombus. Normal compressibility.  Internal Jugular Vein: No evidence of thrombus. Normal compressibility, respiratory phasicity and response to augmentation.  Subclavian Vein: No evidence of thrombus. Normal compressibility, respiratory phasicity and response to augmentation.  Axillary Vein: No evidence of thrombus. Normal compressibility, respiratory phasicity and response to augmentation.  Cephalic Vein: Diminutive without evidence of thrombus. Normal compressibility, respiratory phasicity and response to augmentation.  Basilic Vein: No evidence of thrombus. Normal compressibility, respiratory phasicity and response to augmentation.  Brachial Veins: No evidence of thrombus. Normal compressibility, respiratory phasicity and response to augmentation.  Radial Veins: No evidence of thrombus.  Normal compressibility, respiratory phasicity and response to augmentation.  Ulnar Veins: No evidence of thrombus. Normal compressibility, respiratory phasicity and response to augmentation.  Venous Reflux:  None visualized.  Other Findings: There is a minimal amount of subcutaneous edema at the patient's area of tenderness involving the forearm. No definable/drainable fluid collections.  IMPRESSION: 1. No evidence of acute or chronic DVT within the right upper extremity. 2. Minimal amount of subcutaneous edema is noted at the patient's area tenderness involving the forearm without a definable/drainable fluid collection.   Electronically Signed   By: Sandi Mariscal M.D.   On: 10/24/2014 14:25     EKG Interpretation None      MDM   Final diagnoses:  Swelling  Right arm cellulitis  Lymphedema   Patient given iv rocephin here and planned admission. US done to r/o dvt and negative for same.  Patient states she has always been treated outpatient and refuses admission.  I have discussed possible negative outcomes including worsening illness.  She is given strict return precautions and will  follow up at her pmd office, Dr. Brigitte Pulse, tomorrow.  Plan outpatient doxycycline.     Shaune Pollack, MD 10/24/14 [phone]

## 2015-04-23 ENCOUNTER — Emergency Department (HOSPITAL_BASED_OUTPATIENT_CLINIC_OR_DEPARTMENT_OTHER)
Admission: EM | Admit: 2015-04-23 | Discharge: 2015-04-23 | Disposition: A | Discharge status: Home / Self Care | Payer: Medicare Other | Attending: Emergency Medicine | Admitting: Emergency Medicine

## 2015-04-23 ENCOUNTER — Encounter (HOSPITAL_BASED_OUTPATIENT_CLINIC_OR_DEPARTMENT_OTHER): Payer: Self-pay

## 2015-04-23 DIAGNOSIS — Z853 Personal history of malignant neoplasm of breast: Secondary | ICD-10-CM | POA: Diagnosis not present

## 2015-04-23 DIAGNOSIS — Z79899 Other long term (current) drug therapy: Secondary | ICD-10-CM | POA: Diagnosis not present

## 2015-04-23 DIAGNOSIS — Z7901 Long term (current) use of anticoagulants: Secondary | ICD-10-CM | POA: Insufficient documentation

## 2015-04-23 DIAGNOSIS — N390 Urinary tract infection, site not specified: Secondary | ICD-10-CM | POA: Diagnosis not present

## 2015-04-23 DIAGNOSIS — M810 Age-related osteoporosis without current pathological fracture: Secondary | ICD-10-CM | POA: Diagnosis not present

## 2015-04-23 DIAGNOSIS — I4891 Unspecified atrial fibrillation: Secondary | ICD-10-CM | POA: Diagnosis not present

## 2015-04-23 DIAGNOSIS — E559 Vitamin D deficiency, unspecified: Secondary | ICD-10-CM | POA: Diagnosis not present

## 2015-04-23 DIAGNOSIS — I10 Essential (primary) hypertension: Secondary | ICD-10-CM | POA: Diagnosis not present

## 2015-04-23 DIAGNOSIS — Z792 Long term (current) use of antibiotics: Secondary | ICD-10-CM | POA: Diagnosis not present

## 2015-04-23 DIAGNOSIS — Z8781 Personal history of (healed) traumatic fracture: Secondary | ICD-10-CM | POA: Insufficient documentation

## 2015-04-23 DIAGNOSIS — Z8719 Personal history of other diseases of the digestive system: Secondary | ICD-10-CM | POA: Diagnosis not present

## 2015-04-23 DIAGNOSIS — R319 Hematuria, unspecified: Secondary | ICD-10-CM | POA: Diagnosis present

## 2015-04-23 LAB — URINALYSIS, ROUTINE W REFLEX MICROSCOPIC
Bilirubin Urine: NEGATIVE
GLUCOSE, UA: NEGATIVE mg/dL
Ketones, ur: NEGATIVE mg/dL
Nitrite: NEGATIVE
Protein, ur: 30 mg/dL — AB
SPECIFIC GRAVITY, URINE: 1.009 (ref 1.005–1.030)
Urobilinogen, UA: 0.2 mg/dL (ref 0.0–1.0)
pH: 7 (ref 5.0–8.0)

## 2015-04-23 LAB — URINE MICROSCOPIC-ADD ON

## 2015-04-23 MED ORDER — CEPHALEXIN 500 MG PO CAPS: 500.0000 mg | ORAL_CAPSULE | Freq: Four times a day (QID) | ORAL | Status: DC

## 2015-04-23 NOTE — ED Notes (Signed)
MD at bedside. 

## 2015-04-23 NOTE — ED Notes (Signed)
Pt reports mild dysuria and hematuria since yesterday.  No fevers or additional symptoms.

## 2015-04-23 NOTE — Discharge Instructions (Signed)
Take the antibodic Keflex as directed for the next 7 days. He should be improving in 2 days if not, may have resistance to the antibodic and will need to get re-seen. Return for any new or worse symptoms to include fevers or persistent vomiting. Urine culture is pending.

## 2015-04-23 NOTE — ED Provider Notes (Signed)
CSN: [id number]     Arrival date & time 04/23/15  1057 History   First MD Initiated Contact with Patient 04/23/15 1103     Chief Complaint  Patient presents with  . Hematuria     (Consider location/radiation/quality/duration/timing/severity/associated sxs/prior Treatment) Patient is a 81 y.o. female presenting with hematuria. The history is provided by the patient.  Hematuria Pertinent negatives include no chest pain, no abdominal pain, no headaches and no shortness of breath.   patient with onset of the some blood in the urine yesterday and today had dysuria. Reminds patient of previous urinary tract infections. Patient without any fevers nausea or vomiting. Patient is on Coumadin.  Past Medical History  Diagnosis Date  . HTN (hypertension)   . Atrial tachycardia   . Atrial fibrillation     on coumadin  . Cancer 1988    breast cancer, R mastectomy, chemo  . Hemorrhoids 1991  . Arthritis 1996  . Diverticulitis 1999  . Femur fracture 03/2013    left, non weight bearing for 2 1/2 weeks  . Urinary, incontinence, stress female   . Vitamin D deficiency disease   . Osteoporosis    Past Surgical History  Procedure Laterality Date  . Total hip arthroplasty    . Mastectomy partial / lumpectomy w/ axillary lymphadenectomy  04/05/1987    Right - Dr Annamaria Boots  . Breast biopsy  05/04/1987    Right Dr Annamaria Boots  . Tubal ligation Bilateral 1970  . Vaginal hysterectomy  1977    myomata  . Bunionectomy  1988  . Breast implant removal Right 06/05/10  . Eye surgery  5/14 ; 6/14    Cataract Surgery  . Joint replacement      2002 left; 2009 right Hip, rt knee 2012   Family History  Problem Relation Age of Onset  . Colon cancer Mother   . Colon cancer Father   . Heart disease Father    History  Substance Use Topics  . Smoking status: Never Smoker   . Smokeless tobacco: Never Used  . Alcohol Use: No   OB History    Gravida Para Term Preterm AB TAB SAB Ectopic Multiple Living   '3 3        3      '$ Review of Systems  Constitutional: Negative for fever.  HENT: Negative for congestion.   Eyes: Negative for redness.  Respiratory: Negative for shortness of breath.   Cardiovascular: Negative for chest pain.  Gastrointestinal: Negative for abdominal pain.  Genitourinary: Positive for dysuria and hematuria.  Musculoskeletal: Negative for back pain.  Skin: Negative for rash.  Neurological: Negative for headaches.  Hematological: Bruises/bleeds easily.  Psychiatric/Behavioral: Negative for confusion.      Allergies  Aspirin  Home Medications   Prior to Admission medications   Medication Sig Start Date End Date Taking? Authorizing Provider  carvedilol (COREG) 12.5 MG tablet Take 12.5 mg by mouth 2 (two) times daily with a meal.   Yes Historical Provider, MD  amLODipine (NORVASC) 5 MG tablet Take 5 mg by mouth every morning.    Historical Provider, MD  Calcium Carbonate-Vitamin D (CALTRATE 600+D) 600-400 MG-UNIT per tablet Take 1 tablet by mouth daily.    Historical Provider, MD  doxycycline (VIBRAMYCIN) 100 MG capsule Take 1 capsule (100 mg total) by mouth 2 (two) times daily. 10/24/14   Pattricia Boss, MD  Estradiol (VAGIFEM) 10 MCG TABS vaginal tablet Place 1 tablet (10 mcg total) vaginally 2 (two) times a week. 07/01/14  Kem Boroughs, FNP  Multiple Vitamin (MULTIVITAMIN WITH MINERALS) TABS Take 1 tablet by mouth every evening.    Historical Provider, MD  nebivolol (BYSTOLIC) 5 MG tablet Take 10 mg by mouth every morning.     Historical Provider, MD  Omega-3 Fatty Acids (FISH OIL) 1000 MG CAPS Take 1 capsule by mouth 2 (two) times daily.    Historical Provider, MD  OVER THE COUNTER MEDICATION Macular degeneration supplement.  2 times daily.  Receives from Dr. Payton Emerald office    Historical Provider, MD  telmisartan (MICARDIS) 80 MG tablet Take 80 mg by mouth every morning.    Historical Provider, MD  UNABLE TO FIND 2 (two) times daily. Med Name: Macular Degeneration Supplement     Historical Provider, MD  warfarin (COUMADIN) 4 MG tablet Take 4-6 mg by mouth every evening. Take '6mg'$  Monday and Wednesday. Take '4mg'$  all the other days of the week.    Historical Provider, MD   BP 123/72 mmHg  Pulse 54  Temp(Src) 98.5 F (36.9 C) (Oral)  Resp 16  Ht 5' (1.524 m)  Wt 140 lb (63.504 kg)  BMI 27.34 kg/m2  SpO2 100%  LMP 10/16/1975 Physical Exam  Constitutional: She is oriented to person, place, and time. She appears well-developed and well-nourished. No distress.  HENT:  Head: Atraumatic.  Mouth/Throat: Oropharynx is clear and moist.  Eyes: Conjunctivae and EOM are normal. Pupils are equal, round, and reactive to light.  Neck: Normal range of motion.  Cardiovascular: Normal rate, regular rhythm and normal heart sounds.   No murmur heard. Pulmonary/Chest: Effort normal and breath sounds normal.  Abdominal: Soft. Bowel sounds are normal. There is no tenderness.  Musculoskeletal: Normal range of motion.  Neurological: She is alert and oriented to person, place, and time. No cranial nerve deficit. Coordination normal.  Skin: Skin is warm. No rash noted.  Nursing note and vitals reviewed.   ED Course  Procedures (including critical care time) Labs Review Labs Reviewed  URINALYSIS, ROUTINE W REFLEX MICROSCOPIC (NOT AT [facility]) - Abnormal; Notable for the following:    APPearance CLOUDY (*)    Hgb urine dipstick LARGE (*)    Protein, ur 30 (*)    Leukocytes, UA LARGE (*)    All other components within normal limits  URINE MICROSCOPIC-ADD ON - Abnormal; Notable for the following:    Squamous Epithelial / LPF FEW (*)    Bacteria, UA MANY (*)    All other components within normal limits  URINE CULTURE   Results for orders placed or performed during the hospital encounter of 04/23/15  Urinalysis, Routine w reflex microscopic (not at [facility])  Result Value Ref Range   Color, Urine YELLOW YELLOW   APPearance CLOUDY (A) CLEAR   Specific Gravity, Urine 1.009 1.005 - 1.030    pH 7.0 5.0 - 8.0   Glucose, UA NEGATIVE NEGATIVE mg/dL   Hgb urine dipstick LARGE (A) NEGATIVE   Bilirubin Urine NEGATIVE NEGATIVE   Ketones, ur NEGATIVE NEGATIVE mg/dL   Protein, ur 30 (A) NEGATIVE mg/dL   Urobilinogen, UA 0.2 0.0 - 1.0 mg/dL   Nitrite NEGATIVE NEGATIVE   Leukocytes, UA LARGE (A) NEGATIVE  Urine microscopic-add on  Result Value Ref Range   Squamous Epithelial / LPF FEW (A) RARE   WBC, UA TOO NUMEROUS TO COUNT <3 WBC/hpf   RBC / HPF TOO NUMEROUS TO COUNT <3 RBC/hpf   Bacteria, UA MANY (A) RARE     Imaging Review No results found.  EKG Interpretation None      MDM   Final diagnoses:  UTI (lower urinary tract infection)    Patient nontoxic no acute distress. Nothing consistent with pyelonephritis. No fevers no nausea no vomiting. Patient had a history urinary tract infections in the past. Urinalysis consistent with that. Urine culture has been sent. Patient will be started on Keflex. Patient has primary care doctor to follow-up with. Patient will get seen if not improving in 2 days.    Fredia Sorrow, MD 04/23/15 [phone]

## 2015-04-25 LAB — URINE CULTURE

## 2015-05-04 ENCOUNTER — Ambulatory Visit (INDEPENDENT_AMBULATORY_CARE_PROVIDER_SITE_OTHER): Payer: Medicare Other | Admitting: Certified Nurse Midwife

## 2015-05-04 ENCOUNTER — Encounter: Payer: Self-pay | Admitting: Certified Nurse Midwife

## 2015-05-04 VITALS — BP 110/70 | HR 72 | Resp 16 | Ht 61.75 in | Wt 143.0 lb

## 2015-05-04 DIAGNOSIS — B373 Candidiasis of vulva and vagina: Secondary | ICD-10-CM | POA: Diagnosis not present

## 2015-05-04 DIAGNOSIS — N952 Postmenopausal atrophic vaginitis: Secondary | ICD-10-CM | POA: Diagnosis not present

## 2015-05-04 DIAGNOSIS — B3731 Acute candidiasis of vulva and vagina: Secondary | ICD-10-CM

## 2015-05-04 MED ORDER — NYSTATIN 100000 UNIT/GM EX CREA: 1.0000 "application " | TOPICAL_CREAM | Freq: Two times a day (BID) | CUTANEOUS | Status: DC

## 2015-05-04 NOTE — Progress Notes (Signed)
81 y.o.Widowed  g3p3 here with complaint of vaginal symptoms of itching, burning,external only. Treated recently at [facility] for UTI with Keflex. Started having symptoms of vaginal burning after completion of medication. No new personal products.No vaginal discharge change.  .Using Vagifem for  vaginal dryness.  Urinary symptoms none. Feels so much better.  O:Healthy female WDWN Affect: normal, orientation x 3  Exam: Abdomen:soft, non tender, negative suprapubic Lymph node: no enlargement or tenderness Pelvic exam: External genital: normal female, scaling and exudate noted. Wet prep taken BUS: negative Bladder and urethral meatus non tender Vagina: scant discharge noted with Vagifem tablet noted in vaginal vault.Marland Kitchen Ph: 4.0 ,Wet prep taken,  Cervix: absent Uterus:absent Adnexa:normal, non tender, no masses or fullness noted   Wet Prep results:positive for yeast external, negative for yeast vaginally   A:Normal pelvic exam Yeast vulvitis   P:Discussed findings of yeast vulvitis and etiology. Discussed Aveeno or baking soda sitz bath for comfort. Avoid moist clothes or pads for extended period of time.  Olive Oil/Coconut Oil use for skin protection can be used to external skin. Discussed with patient using protective coating with oil as above will help with irritation from pad use with sporadic incontinence. Questions addressed.  Rx: Nystatin cream see order with instructions  Rv prn

## 2015-05-05 NOTE — Progress Notes (Signed)
Reviewed personally.  M. Suzanne Kenlee Vogt, MD.  

## 2015-07-04 ENCOUNTER — Encounter: Payer: Self-pay | Admitting: Nurse Practitioner

## 2015-07-04 ENCOUNTER — Ambulatory Visit (INDEPENDENT_AMBULATORY_CARE_PROVIDER_SITE_OTHER): Payer: Medicare Other | Admitting: Nurse Practitioner

## 2015-07-04 VITALS — BP 134/82 | HR 60 | Ht 61.0 in | Wt 141.0 lb

## 2015-07-04 DIAGNOSIS — Z01419 Encounter for gynecological examination (general) (routine) without abnormal findings: Secondary | ICD-10-CM

## 2015-07-04 MED ORDER — ESTRADIOL 10 MCG VA TABS: 1.0000 | ORAL_TABLET | VAGINAL | Status: DC

## 2015-07-04 NOTE — Patient Instructions (Signed)

## 2015-07-04 NOTE — Progress Notes (Signed)
Patient ID: [name], female   DOB: [birth year], 81 y.o.   MRN: [id number] 81 y.o. [id number] Widowed  Caucasian Fe here for annual exam.  No new health problems. 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  Patient's last menstrual period was 10/16/1975 (approximate).          Sexually active: No.  The current method of family planning is none.    Exercising: Yes.   Walking at [facility] 3 times per week, also volunteers on day per week at [facility] and is walking 3 hours while there. Smoker:  no  Health Maintenance: Pap: 05/10/09, WNL MMG: 04/05/15,  Bi-Rads 1: negative Colonoscopy: 01/2014, small polyp, no repeat due to age unless problems BMD: 2016, requested copy; 8/14 showed a decrease in BMD, Reclast is due this month. PCP manages TDaP: 2011 Pneumovax: 2011 Shingles: 07/02/2006 Prevnar: early 2015 Labs: PCP (pt brought copies of most recent labs)   reports that she has never smoked. She has never used smokeless tobacco. She reports that she does not drink alcohol or use illicit drugs.  Past Medical History  Diagnosis Date  . HTN (hypertension)   . Atrial tachycardia   . Atrial fibrillation     on coumadin  . Cancer 1988    breast cancer, R mastectomy, chemo  . Hemorrhoids 1991  . Arthritis 1996  . Diverticulitis 1999  . Femur fracture 03/2013    left, non weight bearing for 2 1/2 weeks  . Urinary, incontinence, stress female   . Vitamin D deficiency disease   . Osteoporosis     Past Surgical History  Procedure Laterality Date  . Total hip arthroplasty    . Mastectomy partial / lumpectomy w/ axillary lymphadenectomy  04/05/1987    Right - Dr Annamaria Boots  . Breast biopsy  05/04/1987    Right Dr Annamaria Boots  . Tubal ligation Bilateral 1970  . Vaginal hysterectomy  1977    myomata  . Bunionectomy  1988  . Breast implant removal Right 06/05/10  . Eye surgery  5/14 ; 6/14    Cataract Surgery  . Joint  replacement      2002 left; 2009 right Hip, rt knee 2012    Current Outpatient Prescriptions  Medication Sig Dispense Refill  . amLODipine (NORVASC) 5 MG tablet Take 5 mg by mouth every morning.    . Calcium Carbonate-Vitamin D (CALTRATE 600+D) 600-400 MG-UNIT per tablet Take 1 tablet by mouth daily.    . carvedilol (COREG) 12.5 MG tablet Take 12.5 mg by mouth 2 (two) times daily with a meal.    . Estradiol (VAGIFEM) 10 MCG TABS vaginal tablet Place 1 tablet (10 mcg total) vaginally 2 (two) times a week. 24 tablet 3  . Multiple Vitamin (MULTIVITAMIN WITH MINERALS) TABS Take 1 tablet by mouth every evening.    . Omega-3 Fatty Acids (FISH OIL) 1000 MG CAPS Take 1 capsule by mouth 2 (two) times daily.    Marland Kitchen OVER THE COUNTER MEDICATION Macular degeneration supplement.  2 times daily.  Receives from Dr. Payton Emerald office    . telmisartan (MICARDIS) 80 MG tablet Take 80 mg by mouth every morning.    . warfarin (COUMADIN) 4 MG tablet Take 4-6 mg by mouth every evening. Take '6mg'$  Monday and Wednesday. Take '4mg'$  all the other days of the week.     No current facility-administered medications for this visit.    Family History  Problem  Relation Age of Onset  . Colon cancer Mother   . Colon cancer Father   . Heart disease Father     ROS:  Pertinent items are noted in HPI.  Otherwise, a comprehensive ROS was negative.  Exam:   BP 134/82 mmHg  Pulse 60  Ht '5\' 1"'$  (1.549 m)  Wt 141 lb (63.957 kg)  BMI 26.66 kg/m2  LMP 10/16/1975 (Approximate) Height: '5\' 1"'$  (154.9 cm) Ht Readings from Last 3 Encounters:  07/04/15 '5\' 1"'$  (1.549 m)  05/04/15 5' 1.75" (1.568 m)  04/23/15 5' (1.524 m)    General appearance: alert, cooperative and appears stated age Head: Normocephalic, without obvious abnormality, atraumatic Neck: no adenopathy, supple, symmetrical, trachea midline and thyroid normal to inspection and palpation Lungs: clear to auscultation bilaterally Breasts: normal appearance, no masses or  tenderness, on the left.  Total mastectomy on the right without mass. Heart: regular rate and rhythm Abdomen: soft, non-tender; no masses,  no organomegaly Extremities: extremities normal, atraumatic, no cyanosis or edema Skin: Skin color, texture, turgor normal. No rashes or lesions Lymph nodes: Cervical, supraclavicular, and axillary nodes normal. No abnormal inguinal nodes palpated Neurologic: Grossly normal   Pelvic: External genitalia:  no lesions              Urethra:  normal appearing urethra with no masses, tenderness or lesions              Bartholin's and Skene's: normal                 Vagina: normal appearing vagina with normal color and discharge, no lesions              Cervix: absent              Pap taken: No. Bimanual Exam:  Uterus:  uterus absent              Adnexa: no mass, fullness, tenderness               Rectovaginal: Confirms               Anus:  normal sphincter tone, no lesions  Chaperone present: no  A:  Well Woman with normal exam  S/P TVH secondary to fibroids 1977  S/P right mastectomy 1988 secondary to breast cancer with implant that was later removed 05/2010 S/P bilateral hip replacements and right knee replacement  Fall with fracture of left femur 6/14 - now on Reclast History of Osteoporosis  History of A Fib on Coumadin since 2012 Atrophic vaginitis with urinary incontinence that is stable on Vagifem Vit D deficiency  P:   Reviewed health and wellness pertinent to exam  Pap smear as above  Mammogram is due 03/2016  Labs from PCP are reviewed and will be scanned  Refill on Vagifem for a year (pt. Is aware of need to DC if comes off Coumadin)  Counseled with risk of DVT, CVA, cancer, etc  Counseled on breast self exam, mammography screening, adequate intake of calcium and vitamin D, diet and exercise return annually or prn  An After Visit Summary was printed  and given to the patient.

## 2015-07-09 NOTE — Progress Notes (Signed)
Encounter reviewed by Dr. Aundria Rud. Will review vaginal estrogen use with patient's MD managing her atrial fibrillation and coumadin.

## 2015-07-19 ENCOUNTER — Encounter: Payer: Self-pay | Admitting: Nurse Practitioner

## 2015-08-22 ENCOUNTER — Other Ambulatory Visit (HOSPITAL_COMMUNITY): Payer: Self-pay | Admitting: Internal Medicine

## 2015-08-25 ENCOUNTER — Ambulatory Visit (HOSPITAL_COMMUNITY)
Admission: RE | Admit: 2015-08-25 | Discharge: 2015-08-25 | Disposition: A | Discharge status: Home / Self Care | Payer: Medicare Other | Source: Ambulatory Visit | Attending: Internal Medicine | Admitting: Internal Medicine

## 2015-08-25 ENCOUNTER — Encounter (HOSPITAL_COMMUNITY): Payer: Self-pay

## 2015-08-25 DIAGNOSIS — M810 Age-related osteoporosis without current pathological fracture: Secondary | ICD-10-CM | POA: Insufficient documentation

## 2015-08-25 MED ORDER — ZOLEDRONIC ACID 5 MG/100ML IV SOLN
5.0000 mg | Freq: Once | INTRAVENOUS | Status: AC
  Administered 2015-08-25: 5 mg via INTRAVENOUS
  Filled 2015-08-25: qty 100

## 2015-08-25 MED ORDER — SODIUM CHLORIDE 0.9 % IV SOLN
INTRAVENOUS | Status: AC
  Administered 2015-08-25: 13:00:00 via INTRAVENOUS

## 2015-11-04 ENCOUNTER — Other Ambulatory Visit: Payer: Self-pay | Admitting: Internal Medicine

## 2015-11-04 ENCOUNTER — Ambulatory Visit
Admission: RE | Admit: 2015-11-04 | Discharge: 2015-11-04 | Disposition: A | Discharge status: Home / Self Care | Payer: Medicare Other | Source: Ambulatory Visit | Attending: Internal Medicine | Admitting: Internal Medicine

## 2015-11-04 DIAGNOSIS — R1900 Intra-abdominal and pelvic swelling, mass and lump, unspecified site: Secondary | ICD-10-CM

## 2015-11-04 MED ORDER — IOPAMIDOL (ISOVUE-300) INJECTION 61%
100.0000 mL | Freq: Once | INTRAVENOUS | Status: AC | PRN
  Administered 2015-11-04: 100 mL via INTRAVENOUS

## 2015-11-07 ENCOUNTER — Other Ambulatory Visit (HOSPITAL_COMMUNITY): Payer: Self-pay | Admitting: Internal Medicine

## 2015-11-07 DIAGNOSIS — R918 Other nonspecific abnormal finding of lung field: Secondary | ICD-10-CM

## 2015-11-16 ENCOUNTER — Ambulatory Visit (HOSPITAL_COMMUNITY)
Admission: RE | Admit: 2015-11-16 | Discharge: 2015-11-16 | Disposition: A | Discharge status: Home / Self Care | Payer: Medicare Other | Source: Ambulatory Visit | Attending: Internal Medicine | Admitting: Internal Medicine

## 2015-11-16 DIAGNOSIS — K7689 Other specified diseases of liver: Secondary | ICD-10-CM | POA: Insufficient documentation

## 2015-11-16 DIAGNOSIS — R918 Other nonspecific abnormal finding of lung field: Secondary | ICD-10-CM | POA: Diagnosis not present

## 2015-11-16 DIAGNOSIS — C50919 Malignant neoplasm of unspecified site of unspecified female breast: Secondary | ICD-10-CM | POA: Diagnosis not present

## 2015-11-16 LAB — GLUCOSE, CAPILLARY: GLUCOSE-CAPILLARY: 98 mg/dL (ref 65–99)

## 2015-11-16 MED ORDER — FLUDEOXYGLUCOSE F - 18 (FDG) INJECTION
8.2000 | Freq: Once | INTRAVENOUS | Status: AC | PRN
  Administered 2015-11-16: 8.2 via INTRAVENOUS

## 2015-11-24 ENCOUNTER — Telehealth: Payer: Self-pay | Admitting: *Deleted

## 2015-11-24 NOTE — Telephone Encounter (Signed)
Oncology Nurse Navigator Documentation  Oncology Nurse Navigator Flowsheets 11/24/2015  Navigator Encounter Type Introductory phone call/recieved referral today.  I scheduled her to be seen at [facility] 12/01/15 arrive at 1:45.  I left her a vm message with appt time and place.  I also, left my name and phone number to call if needed.   Treatment Phase Abnormal Scans  Barriers/Navigation Needs Coordination of Care  Interventions Coordination of Care  Coordination of Care Appts  Acuity Level 1  Time Spent with Patient 15

## 2015-11-30 ENCOUNTER — Telehealth: Payer: Self-pay | Admitting: *Deleted

## 2015-11-30 NOTE — Telephone Encounter (Signed)
Called and left a friendly message on pt's voicemail about her clinic appt tomorrow.

## 2015-12-01 ENCOUNTER — Telehealth: Payer: Self-pay | Admitting: *Deleted

## 2015-12-01 ENCOUNTER — Ambulatory Visit: Payer: Medicare Other | Attending: Thoracic Surgery (Cardiothoracic Vascular Surgery) | Admitting: Physical Therapy

## 2015-12-01 ENCOUNTER — Institutional Professional Consult (permissible substitution) (INDEPENDENT_AMBULATORY_CARE_PROVIDER_SITE_OTHER): Payer: Medicare Other | Admitting: Thoracic Surgery (Cardiothoracic Vascular Surgery)

## 2015-12-01 ENCOUNTER — Ambulatory Visit: Admission: RE | Admit: 2015-12-01 | Payer: Medicare Other | Source: Ambulatory Visit | Admitting: Radiation Oncology

## 2015-12-01 VITALS — BP 159/82 | HR 66 | Temp 97.9°F | Resp 18 | Ht 61.0 in | Wt 142.1 lb

## 2015-12-01 DIAGNOSIS — R918 Other nonspecific abnormal finding of lung field: Secondary | ICD-10-CM | POA: Diagnosis not present

## 2015-12-01 DIAGNOSIS — M5386 Other specified dorsopathies, lumbar region: Secondary | ICD-10-CM

## 2015-12-01 DIAGNOSIS — M4004 Postural kyphosis, thoracic region: Secondary | ICD-10-CM | POA: Diagnosis present

## 2015-12-01 DIAGNOSIS — M2560 Stiffness of unspecified joint, not elsewhere classified: Secondary | ICD-10-CM | POA: Insufficient documentation

## 2015-12-01 NOTE — Progress Notes (Signed)
PCP is Marton Redwood, MD Referring Provider is Curt Bears, MD  No chief complaint on file.      [address]       [place] [address]             [phone]      HPI: 82 yo woman sent to [facility] for evaluation of right lower lobe nodules.  [name] is a 82 yo woman with a remote history of breast cancer (1988), hypertension, atrial fibrillation, arthritis and osteoporosis. She is a lifelong nonsmoker. She recently has a physical exam. There was a palpable abdominal mass. A Ct of the abdomen and pelvis revealed a possible gastric leiomyoma. The "mass" was likely the left lobe of the liver. Also noted were 2 right lung nodules. One of the lesions was larger. A PET CT was done which showed new metabolic activity in the 12 mm RLL nodule that was unchanged. The other nodule was interpreted as being a lung nodule but may actually be pleural based. It had doubled in size over 2 years but metabolic activity was indeterminate due to proximity to the liver.  She denies chest pain, pressure or tightness. Denies F,C,S. Denies change in appetite or weight loss. 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  Zubrod Score: At the time of surgery this patient's most appropriate activity status/level should be described as: '[]'$     0    Normal activity, no symptoms '[x]'$     1    Restricted in physical strenuous activity but ambulatory, able to do out light work '[]'$     2    Ambulatory and capable of self care, unable to do work activities, up and about >50 % of waking hours                              '[]'$     3    Only limited self care, in bed greater than 50% of waking hours '[]'$     4    Completely disabled, no self care, confined to bed or chair '[]'$     5    Moribund    Past Medical History  Diagnosis Date  . HTN (hypertension)   . Atrial tachycardia ([place])   . Atrial fibrillation (HCC)     on coumadin  . Cancer [facility]) 1988    breast cancer, R mastectomy, chemo  .  Hemorrhoids 1991  . Arthritis 1996  . Diverticulitis 1999  . Femur fracture ([place]) 03/2013    left, non weight bearing for 2 1/2 weeks  . Urinary, incontinence, stress female   . Vitamin D deficiency disease   . Osteoporosis     Past Surgical History  Procedure Laterality Date  . Total hip arthroplasty    . Mastectomy partial / lumpectomy w/ axillary lymphadenectomy  04/05/1987    Right - Dr Annamaria Boots  . Breast biopsy  05/04/1987    Right Dr Annamaria Boots  . Tubal ligation Bilateral 1970  . Vaginal hysterectomy  1977    myomata  . Bunionectomy  1988  . Breast implant removal Right 06/05/10  . Eye surgery  5/14 ; 6/14    Cataract Surgery  . Joint replacement      2002 left; 2009 right Hip, rt knee 2012    Family History  Problem Relation Age of Onset  . Colon cancer Mother   . Colon cancer Father   . Heart disease Father  Social History Social History  Substance Use Topics  . Smoking status: Never Smoker   . Smokeless tobacco: Never Used  . Alcohol Use: No    Current Outpatient Prescriptions  Medication Sig Dispense Refill  . amLODipine (NORVASC) 5 MG tablet Take 5 mg by mouth every morning.    . Calcium Carbonate-Vitamin D (CALTRATE 600+D) 600-400 MG-UNIT per tablet Take 1 tablet by mouth daily.    . carvedilol (COREG) 25 MG tablet Take 25 mg by mouth 2 (two) times daily with a meal.    . Estradiol (VAGIFEM) 10 MCG TABS vaginal tablet Place 1 tablet (10 mcg total) vaginally 2 (two) times a week. 24 tablet 3  . Multiple Vitamin (MULTIVITAMIN WITH MINERALS) TABS Take 1 tablet by mouth every evening.    . Omega-3 Fatty Acids (FISH OIL) 1000 MG CAPS Take 1 capsule by mouth 2 (two) times daily.    Marland Kitchen OVER THE COUNTER MEDICATION Macular degeneration supplement.  2 times daily.  Receives from Dr. Payton Emerald office    . telmisartan (MICARDIS) 80 MG tablet Take 80 mg by mouth every morning.    . warfarin (COUMADIN) 4 MG tablet Take 4-6 mg by mouth every evening. Take '6mg'$  Monday and  Wednesday. Take '4mg'$  all the other days of the week.     No current facility-administered medications for this visit.    Allergies  Allergen Reactions  . Aspirin Rash    REACTION: rash    Review of Systems  Constitutional: Negative for fever, chills, activity change, appetite change and unexpected weight change.  HENT: Negative for trouble swallowing and voice change.   Eyes: Negative for visual disturbance.  Respiratory: Negative for cough, shortness of breath and wheezing.   Cardiovascular: Positive for palpitations and leg swelling. Negative for chest pain.       Atrial fib  Gastrointestinal: Negative for abdominal pain and blood in stool.       Stomach mass "muscular"  Genitourinary: Negative for hematuria and difficulty urinating.  Musculoskeletal: Positive for joint swelling and arthralgias. Negative for gait problem.  Neurological: Negative for dizziness, seizures, syncope, weakness and headaches.  Hematological: Negative for adenopathy. Bruises/bleeds easily (on coumadin).  All other systems reviewed and are negative.   BP 159/82 mmHg  Pulse 66  Temp(Src) 97.9 F (36.6 C) (Oral)  Resp 18  Ht '5\' 1"'$  (1.549 m)  Wt 142 lb 1.6 oz (64.456 kg)  BMI 26.86 kg/m2  SpO2 98%  LMP 10/16/1975 (Approximate) Physical Exam  Constitutional: She is oriented to person, place, and time. She appears well-developed and well-nourished. No distress.  HENT:  Head: Normocephalic and atraumatic.  Eyes: Conjunctivae and EOM are normal. Pupils are equal, round, and reactive to light. No scleral icterus.  Neck: Neck supple. No thyromegaly present.  Cardiovascular: Normal rate, regular rhythm and normal heart sounds.  Exam reveals no gallop and no friction rub.   No murmur heard. Pulmonary/Chest: Effort normal and breath sounds normal. No respiratory distress. She has no wheezes. She has no rales.  Abdominal: Soft. She exhibits mass (liver palpable to left of midline). She exhibits no  distension. There is no tenderness.  Musculoskeletal: She exhibits no edema.  Lymphadenopathy:    She has no cervical adenopathy.  Neurological: She is alert and oriented to person, place, and time. No cranial nerve deficit.  No focal motor deficit  Skin: Skin is warm and dry.  Vitals reviewed.    Diagnostic Tests: CT ABDOMEN AND PELVIS WITH CONTRAST  TECHNIQUE: Multidetector CT  imaging of the abdomen and pelvis was performed using the standard protocol following bolus administration of intravenous contrast.  CONTRAST: 134m ISOVUE-300 IOPAMIDOL (ISOVUE-300) INJECTION 61%  COMPARISON: CT abdomen and pelvis October 26, 2013 ; Abdominal MRI November 10, 2013  FINDINGS: Lower chest: There is a 2.1 x 1.6 cm nodular lesion in the anterior segment of the right lower lobe which has increased in size compared to study from 2 years prior, at which time this lesion measured 1.2 x 0.9 cm. This lesion is best seen on axial slices 10 and 11, series 5. There is a stable nodular opacity in the medial segment right lower lobe measuring 1.2 x 1.1 cm. There is mild scarring in each lung base. There is a 1 x 1 cm cystic area arising from the right hemidiaphragm.  Hepatobiliary: There is a small hemangioma in the posterior segment right lobe of the liver which was present on the prior MR as stable. There are liver cysts, stable. Largest liver cyst measures 1.4 x 1.4 cm in the dome of the liver on the right. The liver has a somewhat nodular contour suggesting underlying cirrhosis. No new liver lesions are identified. Gallbladder size absent. There is borderline prominence of the common bile duct with a maximum diameter of 9 mm, essentially stable and felt to be secondary to post cholecystectomy state. The appearance of the portal confluence is stable compared to the previous study. The portal vein is patent.  Pancreas: No pancreatic mass or inflammatory focus. Pancreatic duct upper  normal in size, stable.  Spleen: No splenic lesions identified.  Adrenals/Urinary Tract: Right adrenal appears normal. There is mild hypertrophy of the left adrenal. There is an 8 x 8 mm cyst arising from the medial upper pole the right kidney. There is no hydronephrosis on either side. There is no renal or ureteral calculus on either side. Urinary bladder is midline with wall thickness within normal limits. Note that there is artifact from total hip prostheses bilaterally making evaluation of the bladder less than optimal.  Stomach/Bowel: The stomach is distended with fluid. There is a complex structure within the stomach containing fat and solid material, a likely gastric leiomyoma. This lesion measures 2.8 x 2.5 x 1.3 cm. There is no bowel wall or mesenteric thickening. There are sigmoid diverticula without diverticulitis. No bowel obstruction. No free air or portal venous air.  Vascular/Lymphatic: There is atherosclerotic calcification in the aorta. Aorta is tortuous without aneurysmal dilatation. The major mesenteric vessels appear patent. There is no demonstrable adenopathy in the abdomen or pelvis.  Reproductive: Uterus is absent. There is no pelvic mass or pelvic fluid collection.  Other: Appendix appears under. There is no abscess or ascites in the abdomen or pelvis. There is no evidence of left-sided abdominal mass apart from the lesion within the stomach. There is no abdominal wall hernia demonstrable.  Musculoskeletal: There is extensive degenerative type change in the lumbar spine. There is evidence of a prior fracture of the T12 vertebral body, with remodeling. There is a hemangioma in the rightward aspect of the L2 vertebral body. There is a small hemangioma in the L4 vertebral body on the left. There are no blastic or lytic bone lesions. No intramuscular abdominal wall lesion. Patient is status post total hip replacements bilaterally. There is  thoracolumbar dextroscoliosis.  IMPRESSION: 2.1 x 1.6 cm nodular lesion in the right lower lobe which is increased in size compared to prior study. Enlarging parenchymal lung mass lesion raises concern for potential neoplasm. This  finding may well warrant nuclear medicine PET study to further assess.  The contour of the liver raises question of underlying cirrhosis. There are cysts in the liver as well as a small hemangioma in the right lobe, stable. No new liver lesions. The appearance of the biliary ducts is stable compared to prior study. No biliary duct mass or calculus.  Stomach is distended with fluid. There is a fat containing mass within the stomach, a probable gastric leiomyoma, measuring 2.8 x 2.5 x 1.3 cm. Endoscopy to confirm is felt to be advisable. There is no left-sided abdominal mass beyond the distended stomach. No hernia is seen in this area.  Uterus absent. No pelvic mass.  Stable left adrenal hypertrophy.  Extensive lumbar arthropathy, stable.  These results will be called to the ordering clinician or representative by the Radiologist Assistant, and communication documented in the PACS or zVision Dashboard.   Electronically Signed  By: Lowella Grip III M.D.  On: 11/04/2015 11:54  NUCLEAR MEDICINE PET SKULL BASE TO THIGH  TECHNIQUE: 8.2 mCi F-18 FDG was injected intravenously. Full-ring PET imaging was performed from the skull base to thigh after the radiotracer. CT data was obtained and used for attenuation correction and anatomic localization.  FASTING BLOOD GLUCOSE: Value: 98 mg/dl  COMPARISON: 04/29/2013  FINDINGS: NECK  No hypermetabolic lymph nodes in the neck.  CHEST  No hypermetabolic mediastinal or hilar nodes. Surgical clips seen in right axilla, however there is no evidence hypermetabolic axillary lymph nodes.  A 12 mm pulmonary nodule in the medial right lower lobe on image 56/series 8 remains stable  in size but now shows hypermetabolic activity, with SUV max of 2.5.  Another pulmonary nodule in the anterior right lower lobe measures 20 mm on image 56/series 8 compared to 12 mm previously. This appears to show mild hypermetabolic activity, although it is difficult to separate from adjacent activity in the dome of the liver.  Other scattered sub-cm pulmonary nodules are seen bilaterally which are stable and without metabolic activity, but are too small to characterize by PET. No evidence of pleural effusion.  ABDOMEN/PELVIS  No abnormal hypermetabolic activity within the liver, pancreas, adrenal glands, or spleen. Small cyst in the dome of the right hepatic lobe shows no associated metabolic activity. No hypermetabolic lymph nodes in the abdomen or pelvis.  SKELETON  No focal hypermetabolic activity to suggest skeletal metastasis. Bilateral hip prostheses result in artifact through the lower pelvis.  IMPRESSION: Increased size of 2 cm right lower lobe nodule and stable 12 mm right lower lobe nodule, both of which show metabolic activity on today's study. These are highly suspicious for pulmonary metastases.  No other sites of metastatic disease identified.   Electronically Signed  By: Earle Gell M.D.  On: 11/16/2015 16:36  I personally reviewed the CT and PET CT and concur with the findings as noted above. Impression: 82 yo woman with a history of breast cancer many years ago who was being evaluated for a possible abdominal mass and has 2 lesions in the right chest. One is a right lower lobe nodule that is mildly hypermetabolic, the other appears to be a pleural based nodule along the diaphragm that is indeterminate by PET. The lung nodule is suspicious for a primary bronchogenic carcinoma, but also could be a low grade neoplasm such as a carcinoid, or inflammatory in nature. I doubt these are metastatic breast cancer as that was 29 years ago but it is within  the differential.  I reviewed the  Ct and PET with [name] and her son. We discussed the differential diagnosis and options. I think these lesions would be difficult to biopsy either bronchoscopically or percutaneously. That leaves observation v surgical resection. I think either of those is a valid option, but she strongly favors resection for definitive diagnosis and treatment.  The procedure would be right VATS, wedge resection and resection of pleural based mass. I discussed the general nature of the procedure, the need for general anesthesia, and the incisions to be used with [name] and her son. We discussed the expected hospital stay, overall recovery and short and long term outcomes. I reviewed the indications, risks, benefits and alternatives. They understand the risks include, but are not limited to death, stroke, MI, DVT/PE, bleeding, possible need for transfusion, infections, prolonged air leak, and other organ system dysfunction including respiratory, renal, or GI complications.   She is strongly favoring proceeding with surgery but wants to talk things over with her children before making a final decision  Plan:  I will see her back in 2 weeks to further discuss this matter with her  Melrose Nakayama, MD [facility] [phone]

## 2015-12-01 NOTE — Therapy (Signed)
[place], [place], [address] Phone: [phone]   Fax:  [phone]  Physical Therapy Evaluation  Patient Details  Name: [name] MRN: [id number] Date of Birth: [birth year] Referring Provider: Dr. Modesto Charon  Encounter Date: 12/01/2015      PT End of Session - 12/01/15 1805    Visit Number 1   Number of Visits 1   PT Start Time 6378   PT Stop Time 1515   PT Time Calculation (min) 22 min   Activity Tolerance Patient tolerated treatment well   Behavior During Therapy [facility] for tasks assessed/performed      Past Medical History  Diagnosis Date  . HTN (hypertension)   . Atrial tachycardia ([place])   . Atrial fibrillation (H[facility])     on coumadin  . Cancer [facility]) 1988    breast cancer, R mastectomy, chemo  . Hemorrhoids 1991  . Arthritis 1996  . Diverticulitis 1999  . Femur fracture ([place]) 03/2013    left, non weight bearing for 2 1/2 weeks  . Urinary, incontinence, stress female   . Vitamin D deficiency disease   . Osteoporosis     Past Surgical History  Procedure Laterality Date  . Total hip arthroplasty    . Mastectomy partial / lumpectomy w/ axillary lymphadenectomy  04/05/1987    Right - Dr Annamaria Boots  . Breast biopsy  05/04/1987    Right Dr Annamaria Boots  . Tubal ligation Bilateral 1970  . Vaginal hysterectomy  1977    myomata  . Bunionectomy  1988  . Breast implant removal Right 06/05/10  . Eye surgery  5/14 ; 6/14    Cataract Surgery  . Joint replacement      2002 left; 2009 right Hip, rt knee 2012    There were no vitals filed for this visit.  Visit Diagnosis:  Postural kyphosis of thoracic region - Plan: PT plan of care cert/re-cert  Decreased ROM of lumbar spine - Plan: PT plan of care cert/re-cert      Subjective Assessment - 12/01/15 1754    Subjective had a palpable abdominal mass   Patient is accompained by: Family member  son   Pertinent History Incidental finding of  right lung nodule when being worked up re: a palpable abdominal mass.  Had CT and PET scans; has 2 nodules at right lung base.  No pathology to date. She may have further diagnositc testing with bronchoscopy or it may be watched for changes.  h/o breast cancer 29 years ago; bilateral THAs and one TKA.  HTN, atrial fibrillation, arthritis, femur fracture 03/2013, osteoporosis.   Patient Stated Goals get info from all lung clinic providers   Currently in Pain? No/denies            [facility] PT Assessment - 12/01/15 0001    Assessment   Medical Diagnosis right lower lobe nodule, as yet without pathology   Referring Provider Dr. Modesto Charon   Onset Date/Surgical Date 11/04/15   Precautions   Precautions Other (comment)   Precaution Comments cancer precautions   Restrictions   Weight Bearing Restrictions No   Balance Screen   Has the patient fallen in the past 6 months No   Has the patient had a decrease in activity level because of a fear of falling?  No  but she is cautious   Is the patient reluctant to leave their home because of a fear of falling?  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   Leisure [facility] [employer] 3-4x/week x 30 minutes   Cognition   Overall Cognitive Status Within Functional Limits for tasks assessed   Observation/Other Assessments   Observations well-groomed and healthy looking woman of 82 accompanied by her son, attentive   Functional Tests   Functional tests Sit to Stand   Sit to Stand   Comments 11 times in 30 seconds, above average for age   Posture/Postural Control   Posture/Postural Control Postural limitations   Postural Limitations Increased thoracic kyphosis   ROM / Strength   AROM / PROM / Strength AROM    AROM   Overall AROM Comments trunk AROM in standing shows approx. 20% loss all directions (flexion not tested due to osteoporosis)   Ambulation/Gait   Ambulation/Gait Yes   Ambulation/Gait Assistance 7: Independent   Balance   Balance Assessed Yes   Dynamic Standing Balance   Dynamic Standing - Comments reaches forward 9 inches in standing, slightly below average for age                           PT Education - 12-09-15 1804    Education provided Yes   Education Details posture, breathing, energy conservation, walking, cough splinting, PT info   Person(s) Educated Patient;Child(ren)   Methods Explanation;Handout   Comprehension Verbalized understanding               Lung Clinic Goals - 12-09-2015 1809    Patient will be able to verbalize understanding of the benefit of exercise to decrease fatigue.   Status Achieved   Patient will be able to verbalize the importance of posture.   Status Achieved   Patient will be able to demonstrate diaphragmatic breathing for improved lung function.   Status Achieved   Patient will be able to verbalize understanding of the role of physical therapy to prevent functional decline and who to contact if physical therapy is needed.   Status Achieved             Plan - 12/09/2015 1805    Clinical Impression Statement Patient with lung nodules not yet biopsied who may have biopsy or be watched.  She has osteoporosis and is kyphotic.  She exercises 3-4x/week. h/o two THAs and one TKA.  She has mild stiffness with AROM of the spine and limited forward reach in standing.  She appears well.   Pt will benefit from skilled therapeutic intervention in order to improve on the following deficits Postural dysfunction;Decreased range of motion   Rehab Potential Excellent   PT Frequency One time visit   PT Treatment/Interventions Patient/family education   PT Next Visit Plan None at this time.   PT Home Exercise Plan increase walking  to 5-6 days a week   Consulted and Agree with Plan of Care Patient          G-Codes - 2015-12-09 1809    Functional Assessment Tool Used clinical judgement   Functional Limitation Changing and maintaining body position   Changing and Maintaining Body Position Current Status [phone]) At least 20 percent but less than 40 percent impaired, limited or restricted   Changing and Maintaining Body Position Goal Status (V0350) At least  20 percent but less than 40 percent impaired, limited or restricted   Changing and Maintaining Body Position Discharge Status [phone]) At least 20 percent but less than 40 percent impaired, limited or restricted       Problem List Patient Active Problem List   Diagnosis Date Noted  . Long term (current) use of anticoagulants 05/09/2013  . Osteoporosis 05/09/2013  . Constipation 05/08/2013  . Hip fracture, left ([place]) 04/19/2013  . Hyponatremia 04/19/2013  . Atrial fibrillation ([place]) 04/19/2013  . Lung nodule 04/19/2013  . Cellulitis of arm, right 06/05/2011  . HYPERTENSION 01/13/2007  . OSTEOARTHRITIS 01/13/2007  . OSTEOPENIA 01/13/2007    Ashliegh Parekh 12/01/2015, [place], [place], [address] Phone: [phone]   Fax:  [phone]  Name: [name] MRN: [id number] Date of Birth: 11/24/1933   Serafina Royals, PT 12/01/2015 6:12 PM

## 2015-12-01 NOTE — Telephone Encounter (Signed)
Oncology Nurse Navigator Documentation  Oncology Nurse Navigator Flowsheets 12/01/2015  Navigator Encounter Type Telephone/I called and updated [name] regarding what was discussed at cancer conference and that her appt is only with Dr. Roxan Hockey arrive at 2:45.  Patient verbalized understanding of appt time change.   Telephone Outgoing Call  Treatment Phase Pre-Tx/Tx Discussion  Barriers/Navigation Needs Coordination of Care  Interventions Coordination of Care  Coordination of Care Appts  Acuity Level 1  Time Spent with Patient 15

## 2015-12-02 ENCOUNTER — Encounter: Payer: Self-pay | Admitting: *Deleted

## 2015-12-02 NOTE — Progress Notes (Signed)
Oncology Nurse Navigator Documentation  Oncology Nurse Navigator Flowsheets 12/02/2015  Navigator Encounter Type Clinic/MDC/spoke with patient and son yesterday at clinic.  Gave her information on resources and next steps. I faxed orders and history to [facility] with confirmation fax received   Treatment Phase Pre-Tx/Tx Discussion  Barriers/Navigation Needs Education  Education Other  Acuity Level 1  Time Spent with Patient 30

## 2015-12-20 ENCOUNTER — Other Ambulatory Visit: Payer: Self-pay | Admitting: *Deleted

## 2015-12-20 ENCOUNTER — Ambulatory Visit (INDEPENDENT_AMBULATORY_CARE_PROVIDER_SITE_OTHER): Payer: Medicare Other | Admitting: Thoracic Surgery (Cardiothoracic Vascular Surgery)

## 2015-12-20 ENCOUNTER — Encounter: Payer: Self-pay | Admitting: Thoracic Surgery (Cardiothoracic Vascular Surgery)

## 2015-12-20 VITALS — BP 131/71 | HR 63 | Resp 20 | Ht 61.0 in | Wt 140.0 lb

## 2015-12-20 DIAGNOSIS — R918 Other nonspecific abnormal finding of lung field: Secondary | ICD-10-CM

## 2015-12-20 DIAGNOSIS — R911 Solitary pulmonary nodule: Secondary | ICD-10-CM

## 2015-12-20 DIAGNOSIS — J948 Other specified pleural conditions: Secondary | ICD-10-CM

## 2015-12-20 NOTE — Progress Notes (Signed)
[address]       [place] [address]             [phone]       HPI: Mrs. Nierman returns today to further discuss management of her right lower lobe lung nodules  She is a 82 yo woman with a remote history of breast cancer (1988), hypertension, atrial fibrillation, arthritis and osteoporosis. She is a lifelong nonsmoker. She recently had a physical exam. There was a palpable abdominal mass. A CT of the abdomen and pelvis revealed a possible gastric leiomyoma. The "mass" was likely the left lobe of the liver. Also noted on CT were 2 right lung nodules. A PET CT was done which showed new metabolic activity in the 12 mm RLL nodule that was unchanged. The other nodule was interpreted as being a lung nodule but may actually be pleural based. It had doubled in size over 2 years but metabolic activity was indeterminate due to proximity to the liver.  She denies chest pain, pressure or tightness. Denies F,C,S. Denies change in appetite or weight loss. 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  I saw her in [place] couple of weeks ago and we discussed observation versus surgical resection. She favored resection, but wanted some time to think over her options and discuss further with her family before making a decision. She says she now would like to proceed with surgery.  Zubrod Score: At the time of surgery this patient's most appropriate activity status/level should be described as: '[]'$     0    Normal activity, no symptoms '[x]'$     1    Restricted in physical strenuous activity but ambulatory, able to do out light work '[]'$     2    Ambulatory and capable of self care, unable to do work activities, up and about >50 % of waking hours                              '[]'$     3    Only limited self care, in bed greater than 50% of waking hours '[]'$     4    Completely disabled, no self care, confined to bed or chair '[]'$     5    Moribund  Past Medical History  Diagnosis Date  .  HTN (hypertension)   . Atrial tachycardia ([place])   . Atrial fibrillation ([facility])     on coumadin  . Cancer [facility]) 1988    breast cancer, R mastectomy, chemo  . Hemorrhoids 1991  . Arthritis 1996  . Diverticulitis 1999  . Femur fracture ([place]) 03/2013    left, non weight bearing for 2 1/2 weeks  . Urinary, incontinence, stress female   . Vitamin D deficiency disease   . Osteoporosis    Past Surgical History  Procedure Laterality Date  . Total hip arthroplasty    . Mastectomy partial / lumpectomy w/ axillary lymphadenectomy  04/05/1987    Right - Dr Annamaria Boots  . Breast biopsy  05/04/1987    Right Dr Annamaria Boots  . Tubal ligation Bilateral 1970  . Vaginal hysterectomy  1977    myomata  . Bunionectomy  1988  . Breast implant removal Right 06/05/10  . Eye surgery  5/14 ; 6/14    Cataract Surgery  . Joint replacement      2002 left; 2009 right Hip, rt knee 2012  Current Outpatient Prescriptions  Medication Sig Dispense Refill  . amLODipine (NORVASC) 5 MG tablet Take 5 mg by mouth every morning.    . Calcium Carbonate-Vitamin D (CALTRATE 600+D) 600-400 MG-UNIT per tablet Take 1 tablet by mouth daily.    . carvedilol (COREG) 25 MG tablet Take 25 mg by mouth 2 (two) times daily with a meal.    . Estradiol (VAGIFEM) 10 MCG TABS vaginal tablet Place 1 tablet (10 mcg total) vaginally 2 (two) times a week. 24 tablet 3  . Multiple Vitamin (MULTIVITAMIN WITH MINERALS) TABS Take 1 tablet by mouth every evening.    . Omega-3 Fatty Acids (FISH OIL) 1000 MG CAPS Take 1 capsule by mouth 2 (two) times daily.    Marland Kitchen OVER THE COUNTER MEDICATION Macular degeneration supplement.  2 times daily.  Receives from Dr. Payton Emerald office    . telmisartan (MICARDIS) 80 MG tablet Take 80 mg by mouth every morning.    . warfarin (COUMADIN) 4 MG tablet Take 4-6 mg by mouth every evening. Take '6mg'$  Monday and Wednesday. Take '4mg'$  all the other days of the week.     No current facility-administered medications for this visit.    Social History   Social History  . Marital Status: Widowed    Spouse Name: N/A  . Number of Children: N/A  . Years of Education: N/A   Occupational History  . Not on file.   Social History Main Topics  . Smoking status: Never Smoker   . Smokeless tobacco: Never Used  . Alcohol Use: No  . Drug Use: No  . Sexual Activity: No     Comment: hysterectomy   Other Topics Concern  . Not on file   Social History Narrative   Family History  Problem Relation Age of Onset  . Colon cancer Mother   . Colon cancer Father   . Heart disease Father    Review of Systems  Constitutional: Negative for fever, chills, activity change, appetite change and unexpected weight change.  HENT: Negative for trouble swallowing and voice change.  Eyes: Negative for visual disturbance.  Respiratory: Negative for cough, shortness of breath and wheezing.  Cardiovascular: Positive for palpitations and leg swelling. Negative for chest pain.   Atrial fib  Gastrointestinal: Negative for abdominal pain and blood in stool.   Stomach mass "muscular"  Genitourinary: Negative for hematuria and difficulty urinating.  Musculoskeletal: Positive for joint swelling and arthralgias. Negative for gait problem.  Neurological: Negative for dizziness, seizures, syncope, weakness and headaches.  Hematological: Negative for adenopathy. Bruises/bleeds easily (on coumadin).  All other systems reviewed and are negative. Unchanged from last visit  Physical Exam  BP 131/71 mmHg  Pulse 63  Resp 20  Ht '5\' 1"'$  (1.549 m)  Wt 140 lb (63.504 kg)  BMI 26.47 kg/m2  SpO2 98%  LMP 10/16/1975 (Approximate)  Constitutional: She is oriented to person, place, and time. She appears well-developed and well-nourished. No distress.  HENT:  Head: Normocephalic and atraumatic.  Eyes: Conjunctivae and EOM are normal. Pupils are equal, round, and reactive to light. No scleral icterus.  Neck: Neck supple. No thyromegaly  present.  Cardiovascular: Normal rate, regular rhythm and normal heart sounds. Exam reveals no gallop and no friction rub.  No murmur heard. Pulmonary/Chest: Effort normal and breath sounds normal. No respiratory distress. She has no wheezes. She has no rales.  Abdominal: Soft. She exhibits mass (liver palpable to left of midline). She exhibits no distension. There is no tenderness.  Musculoskeletal:  She exhibits no edema.  Lymphadenopathy:   She has no cervical adenopathy.  Neurological: She is alert and oriented to person, place, and time. No cranial nerve deficit.  No focal motor deficit  Skin: Skin is warm and dry.   Diagnostic Tests: NUCLEAR MEDICINE PET SKULL BASE TO THIGH  TECHNIQUE: 8.2 mCi F-18 FDG was injected intravenously. Full-ring PET imaging was performed from the skull base to thigh after the radiotracer. CT data was obtained and used for attenuation correction and anatomic localization.  FASTING BLOOD GLUCOSE: Value: 98 mg/dl  COMPARISON: 04/29/2013  FINDINGS: NECK  No hypermetabolic lymph nodes in the neck.  CHEST  No hypermetabolic mediastinal or hilar nodes. Surgical clips seen in right axilla, however there is no evidence hypermetabolic axillary lymph nodes.  A 12 mm pulmonary nodule in the medial right lower lobe on image 56/series 8 remains stable in size but now shows hypermetabolic activity, with SUV max of 2.5.  Another pulmonary nodule in the anterior right lower lobe measures 20 mm on image 56/series 8 compared to 12 mm previously. This appears to show mild hypermetabolic activity, although it is difficult to separate from adjacent activity in the dome of the liver.  Other scattered sub-cm pulmonary nodules are seen bilaterally which are stable and without metabolic activity, but are too small to characterize by PET. No evidence of pleural effusion.  ABDOMEN/PELVIS  No abnormal hypermetabolic activity within the liver,  pancreas, adrenal glands, or spleen. Small cyst in the dome of the right hepatic lobe shows no associated metabolic activity. No hypermetabolic lymph nodes in the abdomen or pelvis.  SKELETON  No focal hypermetabolic activity to suggest skeletal metastasis. Bilateral hip prostheses result in artifact through the lower pelvis.  IMPRESSION: Increased size of 2 cm right lower lobe nodule and stable 12 mm right lower lobe nodule, both of which show metabolic activity on today's study. These are highly suspicious for pulmonary metastases.  No other sites of metastatic disease identified.   Electronically Signed  By: Earle Gell M.D.  On: 11/16/2015 16:36  I again personally reviewed the CT and PET CT and concur with finding as noted above  Impression: 82 year old woman who is a lifelong nonsmoker with a 12 mm spiculated mass in the right lower lobe that is mildly hypermetabolic on PET. This is highly suspicious for a low-grade adenocarcinoma. Other possibilities include infectious or inflammatory etiologies. There also is a smoothly marginated mass that appears to be arising from the pleural surface on the diaphragm. This was indeterminate on PET due to proximity to the liver.  My recommendation was that we proceed with right VATS, wedge resection right lower lobe nodule and removal of the pleural based nodule, and possible segmentectomy (if the right lower lobe nodule is cancer). I discussed the general nature of the procedure with the patient and her daughter. We reviewed the need for general anesthesia, the incisions to be used, the use of drainage tubes postoperatively, the expected hospital stay, and the overall recovery. I reviewed the indications, risks, benefits, and alternatives. They understand the risk include but are not limited to death, MI, DVT, PE, bleeding, possible need for transfusion, infection, prolonged air leak, cardiac arrhythmias, as well as the possibility of  other unforeseeable complications.  She accepts the risks and wishes to proceed.  She is on Coumadin for atrial fibrillation. She needs to be off that for 5 days prior to surgery.  Plan: Right VATS, wedge resection right lower lobe nodule, removal of pleural-based nodule,  possible segmentectomy on Thursday, March 23. She will be admitted on the day of surgery.  She will hold Coumadin after her dose on March 17.  I spent 25 minutes with Mrs. Goranson during this visit, greater than 50% spent in counseling Melrose Nakayama, MD [facility] [phone]

## 2016-01-02 NOTE — Pre-Procedure Instructions (Signed)
MALKA BOCEK  01/02/2016      [id number] [place] [facility] [id number]: [phone]: [phone]   Your procedure is scheduled on Thursday, March 23rd   [facility]             (posted surgery time 9:43 am - 12:26 pm)   Call this number if you have problems the morning of surgery:  [phone]   Remember:  Do not eat food or drink liquids after midnight Wednesday.   Take these medicines the morning of surgery with A SIP OF WATER : Coreg, Norvasc              4-5 days prior to surgery, STOP taking vitamins, herbal supplements and anti-inflammatories.   Do not wear jewelry, make-up or nail polish.  Do not wear lotions, powders, or perfumes.  You may NOT wear deodorant the day of surgery.   Do not shave underarms & legs 48 hours prior to surgery.    Do not bring valuables to the hospital.  [facility] responsible for any belongings or valuables.  Contacts, dentures or bridgework may not be worn into surgery.  Leave your suitcase in the car.  After surgery it may be brought to your room. For patients admitted to the hospital, discharge time will be determined by your treatment team.  Name and phone number of your driver:     Please read over the following fact sheets that you were given. Pain Booklet, Coughing and Deep Breathing, Blood Transfusion Information, MRSA Information and Surgical Site Infection Prevention

## 2016-01-03 ENCOUNTER — Ambulatory Visit (HOSPITAL_COMMUNITY)
Admission: RE | Admit: 2016-01-03 | Discharge: 2016-01-03 | Disposition: A | Discharge status: Home / Self Care | Payer: Medicare Other | Source: Ambulatory Visit | Attending: Thoracic Surgery (Cardiothoracic Vascular Surgery) | Admitting: Thoracic Surgery (Cardiothoracic Vascular Surgery)

## 2016-01-03 ENCOUNTER — Encounter (HOSPITAL_COMMUNITY): Payer: Self-pay

## 2016-01-03 ENCOUNTER — Encounter (HOSPITAL_COMMUNITY)
Admission: RE | Admit: 2016-01-03 | Discharge: 2016-01-03 | Disposition: A | Discharge status: Home / Self Care | Payer: Medicare Other | Source: Ambulatory Visit | Attending: Thoracic Surgery (Cardiothoracic Vascular Surgery) | Admitting: Thoracic Surgery (Cardiothoracic Vascular Surgery)

## 2016-01-03 VITALS — BP 126/40 | HR 61 | Temp 98.6°F | Resp 20 | Ht 61.0 in | Wt 142.9 lb

## 2016-01-03 DIAGNOSIS — K219 Gastro-esophageal reflux disease without esophagitis: Secondary | ICD-10-CM | POA: Insufficient documentation

## 2016-01-03 DIAGNOSIS — R911 Solitary pulmonary nodule: Secondary | ICD-10-CM

## 2016-01-03 DIAGNOSIS — Z01818 Encounter for other preprocedural examination: Secondary | ICD-10-CM

## 2016-01-03 DIAGNOSIS — I10 Essential (primary) hypertension: Secondary | ICD-10-CM

## 2016-01-03 DIAGNOSIS — J948 Other specified pleural conditions: Secondary | ICD-10-CM

## 2016-01-03 DIAGNOSIS — Z01812 Encounter for preprocedural laboratory examination: Secondary | ICD-10-CM

## 2016-01-03 DIAGNOSIS — Z01810 Encounter for preprocedural cardiovascular examination: Secondary | ICD-10-CM | POA: Insufficient documentation

## 2016-01-03 DIAGNOSIS — I4891 Unspecified atrial fibrillation: Secondary | ICD-10-CM

## 2016-01-03 DIAGNOSIS — J449 Chronic obstructive pulmonary disease, unspecified: Secondary | ICD-10-CM | POA: Insufficient documentation

## 2016-01-03 DIAGNOSIS — R918 Other nonspecific abnormal finding of lung field: Secondary | ICD-10-CM

## 2016-01-03 DIAGNOSIS — Z7901 Long term (current) use of anticoagulants: Secondary | ICD-10-CM

## 2016-01-03 DIAGNOSIS — R9431 Abnormal electrocardiogram [ECG] [EKG]: Secondary | ICD-10-CM

## 2016-01-03 DIAGNOSIS — R222 Localized swelling, mass and lump, trunk: Secondary | ICD-10-CM

## 2016-01-03 HISTORY — DX: Other specified postprocedural states: R11.2

## 2016-01-03 HISTORY — DX: Lymphedema, not elsewhere classified: I89.0

## 2016-01-03 HISTORY — DX: Nausea with vomiting, unspecified: R11.2

## 2016-01-03 HISTORY — DX: Gastro-esophageal reflux disease without esophagitis: K21.9

## 2016-01-03 HISTORY — DX: Other specified postprocedural states: Z98.890

## 2016-01-03 LAB — BLOOD GAS, ARTERIAL
Acid-Base Excess: 1.2 mmol/L (ref 0.0–2.0)
Bicarbonate: 25 mEq/L — ABNORMAL HIGH (ref 20.0–24.0)
FIO2: 0.21
O2 SAT: 98.2 %
PATIENT TEMPERATURE: 98.6
TCO2: 26.2 mmol/L (ref 0–100)
pCO2 arterial: 38.2 mmHg (ref 35.0–45.0)
pH, Arterial: 7.433 (ref 7.350–7.450)
pO2, Arterial: 107 mmHg — ABNORMAL HIGH (ref 80.0–100.0)

## 2016-01-03 LAB — COMPREHENSIVE METABOLIC PANEL
ALBUMIN: 3.9 g/dL (ref 3.5–5.0)
ALK PHOS: 50 U/L (ref 38–126)
ALT: 26 U/L (ref 14–54)
AST: 26 U/L (ref 15–41)
Anion gap: 12 (ref 5–15)
BUN: 10 mg/dL (ref 6–20)
CALCIUM: 10.2 mg/dL (ref 8.9–10.3)
CO2: 22 mmol/L (ref 22–32)
CREATININE: 0.68 mg/dL (ref 0.44–1.00)
Chloride: 102 mmol/L (ref 101–111)
GFR calc Af Amer: >60 mL/min (ref >60)
GFR calc non Af Amer: >60 mL/min (ref >60)
GLUCOSE: 100 mg/dL — AB (ref 65–99)
Potassium: 4.3 mmol/L (ref 3.5–5.1)
SODIUM: 136 mmol/L (ref 135–145)
Total Bilirubin: 1.7 mg/dL — ABNORMAL HIGH (ref 0.3–1.2)
Total Protein: 6.2 g/dL — ABNORMAL LOW (ref 6.5–8.1)

## 2016-01-03 LAB — PROTIME-INR
INR: 1.43 (ref 0.00–1.49)
Prothrombin Time: 17.5 seconds — ABNORMAL HIGH (ref 11.6–15.2)

## 2016-01-03 LAB — URINALYSIS, ROUTINE W REFLEX MICROSCOPIC
Bilirubin Urine: NEGATIVE
Glucose, UA: NEGATIVE mg/dL
KETONES UR: NEGATIVE mg/dL
Nitrite: NEGATIVE
PROTEIN: NEGATIVE mg/dL
Specific Gravity, Urine: 1.01 (ref 1.005–1.030)
pH: 7 (ref 5.0–8.0)

## 2016-01-03 LAB — TYPE AND SCREEN
ABO/RH(D): A POS
Antibody Screen: NEGATIVE

## 2016-01-03 LAB — CBC
HCT: 39.2 % (ref 36.0–46.0)
HEMOGLOBIN: 12.5 g/dL (ref 12.0–15.0)
MCH: 30.4 pg (ref 26.0–34.0)
MCHC: 31.9 g/dL (ref 30.0–36.0)
MCV: 95.4 fL (ref 78.0–100.0)
Platelets: 123 10*3/uL — ABNORMAL LOW (ref 150–400)
RBC: 4.11 MIL/uL (ref 3.87–5.11)
RDW: 14.3 % (ref 11.5–15.5)
WBC: 4.1 10*3/uL (ref 4.0–10.5)

## 2016-01-03 LAB — URINE MICROSCOPIC-ADD ON

## 2016-01-03 LAB — SURGICAL PCR SCREEN
MRSA, PCR: NEGATIVE
STAPHYLOCOCCUS AUREUS: NEGATIVE

## 2016-01-03 LAB — ABO/RH: ABO/RH(D): A POS

## 2016-01-03 LAB — APTT: APTT: 33 s (ref 24–37)

## 2016-01-03 NOTE — Progress Notes (Signed)
Notified Levonne Spiller, RN at Dr. Leonarda Salon office about patient's UA/UC results.

## 2016-01-03 NOTE — Progress Notes (Addendum)
Patient's PCP is Marton Redwood, MD  Patient denies following with a cardiologist.  She did have a stress test done in 2004 when she was having chest pains d/t stress around the time that her husband passed away.  She does have a hx of Atrial Fibrillation and is on coumadin for that which is managed by Dr. Brigitte Pulse.  She stopped her coumadin on the 18th per Dr. Leonarda Salon office.

## 2016-01-04 ENCOUNTER — Telehealth: Payer: Self-pay

## 2016-01-04 MED ORDER — CIPROFLOXACIN HCL 500 MG PO TABS: 500.0000 mg | ORAL_TABLET | Freq: Two times a day (BID) | ORAL | Status: DC

## 2016-01-04 MED ORDER — CEFUROXIME SODIUM 1.5 G IJ SOLR
1.5000 g | INTRAMUSCULAR | Status: AC
  Administered 2016-01-05: 1.5 g via INTRAVENOUS
  Filled 2016-01-04: qty 1.5

## 2016-01-04 NOTE — Telephone Encounter (Signed)
RX for Cipro 500 mg bid x 3 days [facility].

## 2016-01-04 NOTE — Progress Notes (Signed)
Anesthesia Chart Review:  Pt is a 82 year old female scheduled for R chest VATS/wedge resection on 01/05/2016 with Dr. Roxan Hockey.   PMH includes:  Atrial fibrillation, atrial tachycardia, HTN, GERD, post-op N/V. Never smoker. BMI 27  Medications include: amlodipine, carvedilol, telmisartan, coumadin. Coumadin stopped 12/31/15.   Preoperative labs reviewed.  PT 17.4. Will repeat DOS. UA indicates UTI. PAT RN notified Ryan in Dr. Leonarda Salon office.   Chest x-ray 01/03/16 reviewed.  1. Known abnormal right lower lobe mass. There is no pleural effusion or pneumothorax. 2. COPD with mild chronic interstitial changes. 3. Top-normal cardiac size without pulmonary edema. 4. Partial compression of the body of T12 similar to that seen on the scout image of the CT study in July 2015.  EKG 01/03/16: Atrial fibrillation with slow ventricular response (55 bpm). RSR' or QR pattern in V1 suggests right ventricular conduction delay.  Echo 12/21/07 (care everywhere):  1. The patient is in atrial fibrillation. 2. The left ventricular internal dimensions and contractility appear normal. 3. The left atrial dimensions appear enlarged. 4. The right heart dimensions are probably normal. 5. No pericardial effusion is apparent. 6. Ejection fraction is normal. 7. A mild degree of mitral annular calcification is apparent and slight increased reflectivity of the aortic root and leaflets is appreciated. 8. The aortic valve is somewhat thickened and in some views suggests a bicuspid aortic valve but the views are not ideal. 9. Right atrial dimensions are somewhat increased. 10. Inferior vena cava is probably somewhat increased. There is also some bowing of atrial septum. No obvious shunt could be appreciated but bowing is apparent. This can be associated with a probe-patent foramen ovale.  Pt does not follow with cardiology, PCP Dr. Marton Redwood manages afib.   Dr. Leonarda Salon note 12/20/15 documents no murmur and  pt denied CV symptoms.   If no changes, I anticipate pt can proceed with surgery as scheduled.   Willeen Cass, FNP-BC [facility]Anesthesiology Phone: [phone] 01/04/2016 11:59 AM

## 2016-01-05 ENCOUNTER — Encounter (HOSPITAL_COMMUNITY): Payer: Self-pay | Admitting: *Deleted

## 2016-01-05 ENCOUNTER — Inpatient Hospital Stay (HOSPITAL_COMMUNITY): Payer: Medicare Other

## 2016-01-05 ENCOUNTER — Inpatient Hospital Stay (HOSPITAL_COMMUNITY): Payer: Medicare Other | Admitting: Certified Registered"

## 2016-01-05 ENCOUNTER — Inpatient Hospital Stay (HOSPITAL_COMMUNITY)
Admission: RE | Admit: 2016-01-05 | Discharge: 2016-01-17 | DRG: 164 | Disposition: A | Discharge status: Home / Self Care | Payer: Medicare Other | Source: Ambulatory Visit | Attending: Thoracic Surgery (Cardiothoracic Vascular Surgery) | Admitting: Thoracic Surgery (Cardiothoracic Vascular Surgery)

## 2016-01-05 ENCOUNTER — Encounter (HOSPITAL_COMMUNITY)
Admission: RE | Disposition: A | Discharge status: Home / Self Care | Payer: Self-pay | Source: Ambulatory Visit | Attending: Thoracic Surgery (Cardiothoracic Vascular Surgery)

## 2016-01-05 ENCOUNTER — Inpatient Hospital Stay (HOSPITAL_COMMUNITY): Payer: Medicare Other | Admitting: Emergency Medicine

## 2016-01-05 DIAGNOSIS — I10 Essential (primary) hypertension: Secondary | ICD-10-CM | POA: Diagnosis present

## 2016-01-05 DIAGNOSIS — M810 Age-related osteoporosis without current pathological fracture: Secondary | ICD-10-CM | POA: Diagnosis present

## 2016-01-05 DIAGNOSIS — I481 Persistent atrial fibrillation: Secondary | ICD-10-CM | POA: Diagnosis present

## 2016-01-05 DIAGNOSIS — E559 Vitamin D deficiency, unspecified: Secondary | ICD-10-CM | POA: Diagnosis present

## 2016-01-05 DIAGNOSIS — Z96643 Presence of artificial hip joint, bilateral: Secondary | ICD-10-CM | POA: Diagnosis present

## 2016-01-05 DIAGNOSIS — N393 Stress incontinence (female) (male): Secondary | ICD-10-CM | POA: Diagnosis present

## 2016-01-05 DIAGNOSIS — T8182XA Emphysema (subcutaneous) resulting from a procedure, initial encounter: Secondary | ICD-10-CM | POA: Diagnosis not present

## 2016-01-05 DIAGNOSIS — C3431 Malignant neoplasm of lower lobe, right bronchus or lung: Secondary | ICD-10-CM | POA: Diagnosis not present

## 2016-01-05 DIAGNOSIS — J95811 Postprocedural pneumothorax: Secondary | ICD-10-CM | POA: Diagnosis not present

## 2016-01-05 DIAGNOSIS — J9819 Other pulmonary collapse: Secondary | ICD-10-CM

## 2016-01-05 DIAGNOSIS — Z9011 Acquired absence of right breast and nipple: Secondary | ICD-10-CM

## 2016-01-05 DIAGNOSIS — I482 Chronic atrial fibrillation, unspecified: Secondary | ICD-10-CM | POA: Diagnosis present

## 2016-01-05 DIAGNOSIS — R911 Solitary pulmonary nodule: Secondary | ICD-10-CM | POA: Diagnosis present

## 2016-01-05 DIAGNOSIS — J939 Pneumothorax, unspecified: Secondary | ICD-10-CM

## 2016-01-05 DIAGNOSIS — Z7901 Long term (current) use of anticoagulants: Secondary | ICD-10-CM | POA: Diagnosis not present

## 2016-01-05 DIAGNOSIS — Z853 Personal history of malignant neoplasm of breast: Secondary | ICD-10-CM | POA: Diagnosis not present

## 2016-01-05 DIAGNOSIS — Z9221 Personal history of antineoplastic chemotherapy: Secondary | ICD-10-CM | POA: Diagnosis not present

## 2016-01-05 DIAGNOSIS — D696 Thrombocytopenia, unspecified: Secondary | ICD-10-CM | POA: Diagnosis present

## 2016-01-05 DIAGNOSIS — J948 Other specified pleural conditions: Secondary | ICD-10-CM

## 2016-01-05 DIAGNOSIS — Z902 Acquired absence of lung [part of]: Secondary | ICD-10-CM

## 2016-01-05 DIAGNOSIS — D649 Anemia, unspecified: Secondary | ICD-10-CM | POA: Diagnosis present

## 2016-01-05 DIAGNOSIS — K219 Gastro-esophageal reflux disease without esophagitis: Secondary | ICD-10-CM | POA: Diagnosis present

## 2016-01-05 DIAGNOSIS — Y838 Other surgical procedures as the cause of abnormal reaction of the patient, or of later complication, without mention of misadventure at the time of the procedure: Secondary | ICD-10-CM | POA: Diagnosis not present

## 2016-01-05 DIAGNOSIS — C7A090 Malignant carcinoid tumor of the bronchus and lung: Secondary | ICD-10-CM

## 2016-01-05 DIAGNOSIS — I455 Other specified heart block: Secondary | ICD-10-CM

## 2016-01-05 DIAGNOSIS — I4891 Unspecified atrial fibrillation: Secondary | ICD-10-CM | POA: Diagnosis present

## 2016-01-05 DIAGNOSIS — Z96651 Presence of right artificial knee joint: Secondary | ICD-10-CM | POA: Diagnosis present

## 2016-01-05 HISTORY — PX: RESECTION OF MEDIASTINAL MASS: SHX6497

## 2016-01-05 HISTORY — PX: VIDEO ASSISTED THORACOSCOPY (VATS)/WEDGE RESECTION: SHX6174

## 2016-01-05 HISTORY — PX: LOBECTOMY: SHX5089

## 2016-01-05 HISTORY — DX: Malignant carcinoid tumor of the bronchus and lung: C7A.090

## 2016-01-05 LAB — PROTIME-INR
INR: 1.32 (ref 0.00–1.49)
Prothrombin Time: 16.5 seconds — ABNORMAL HIGH (ref 11.6–15.2)

## 2016-01-05 SURGERY — VIDEO ASSISTED THORACOSCOPY (VATS)/WEDGE RESECTION
Anesthesia: General | Site: Chest | Laterality: Right

## 2016-01-05 MED ORDER — LIDOCAINE HCL (CARDIAC) 20 MG/ML IV SOLN
INTRAVENOUS | Status: AC
  Filled 2016-01-05: qty 5

## 2016-01-05 MED ORDER — BUPIVACAINE HCL (PF) 0.5 % IJ SOLN
INTRAMUSCULAR | Status: DC | PRN
  Administered 2016-01-05: 10 mL

## 2016-01-05 MED ORDER — POTASSIUM CHLORIDE 10 MEQ/50ML IV SOLN: 10.0000 meq | Freq: Every day | INTRAVENOUS | Status: DC | PRN

## 2016-01-05 MED ORDER — CETYLPYRIDINIUM CHLORIDE 0.05 % MT LIQD
7.0000 mL | Freq: Two times a day (BID) | OROMUCOSAL | Status: DC
  Administered 2016-01-06 – 2016-01-11 (×6): 7 mL via OROMUCOSAL

## 2016-01-05 MED ORDER — IRBESARTAN 300 MG PO TABS
300.0000 mg | ORAL_TABLET | Freq: Every day | ORAL | Status: DC
  Filled 2016-01-05 (×2): qty 1

## 2016-01-05 MED ORDER — NALOXONE HCL 0.4 MG/ML IJ SOLN
0.4000 mg | INTRAMUSCULAR | Status: DC | PRN
Start: 2016-01-05 — End: 2016-01-08

## 2016-01-05 MED ORDER — LEVALBUTEROL HCL 0.63 MG/3ML IN NEBU
0.6300 mg | INHALATION_SOLUTION | Freq: Four times a day (QID) | RESPIRATORY_TRACT | Status: DC
  Administered 2016-01-05: 0.63 mg via RESPIRATORY_TRACT
  Filled 2016-01-05: qty 3

## 2016-01-05 MED ORDER — FENTANYL CITRATE (PF) 100 MCG/2ML IJ SOLN
INTRAMUSCULAR | Status: AC
  Filled 2016-01-05: qty 2

## 2016-01-05 MED ORDER — EPHEDRINE SULFATE 50 MG/ML IJ SOLN
INTRAMUSCULAR | Status: AC
  Filled 2016-01-05: qty 1

## 2016-01-05 MED ORDER — BISACODYL 5 MG PO TBEC
10.0000 mg | DELAYED_RELEASE_TABLET | Freq: Every day | ORAL | Status: DC
  Administered 2016-01-05 – 2016-01-11 (×4): 10 mg via ORAL
  Administered 2016-01-14: 5 mg via ORAL
  Filled 2016-01-05 (×8): qty 2

## 2016-01-05 MED ORDER — BUPIVACAINE 0.5 % ON-Q PUMP SINGLE CATH 400 ML
400.0000 mL | INJECTION | Status: DC
  Filled 2016-01-05: qty 400

## 2016-01-05 MED ORDER — CARVEDILOL 25 MG PO TABS
25.0000 mg | ORAL_TABLET | Freq: Two times a day (BID) | ORAL | Status: DC
  Administered 2016-01-06: 25 mg via ORAL
  Filled 2016-01-05: qty 1

## 2016-01-05 MED ORDER — DEXAMETHASONE SODIUM PHOSPHATE 4 MG/ML IJ SOLN
INTRAMUSCULAR | Status: DC | PRN
  Administered 2016-01-05: 4 mg via INTRAVENOUS

## 2016-01-05 MED ORDER — POLYVINYL ALCOHOL 1.4 % OP SOLN
1.0000 [drp] | Freq: Two times a day (BID) | OPHTHALMIC | Status: DC | PRN
  Filled 2016-01-05: qty 15

## 2016-01-05 MED ORDER — PHENYLEPHRINE HCL 10 MG/ML IJ SOLN
INTRAMUSCULAR | Status: DC | PRN
  Administered 2016-01-05 (×2): 80 ug via INTRAVENOUS

## 2016-01-05 MED ORDER — FENTANYL 40 MCG/ML IV SOLN
INTRAVENOUS | Status: DC
  Administered 2016-01-05: 15:00:00 via INTRAVENOUS
  Administered 2016-01-05: 215 ug via INTRAVENOUS
  Administered 2016-01-06: 40 ug via INTRAVENOUS
  Administered 2016-01-06 – 2016-01-07 (×4): 20 ug via INTRAVENOUS
  Administered 2016-01-07: 60 ug via INTRAVENOUS
  Administered 2016-01-07: 20 ug via INTRAVENOUS

## 2016-01-05 MED ORDER — ACETAMINOPHEN 500 MG PO TABS
1000.0000 mg | ORAL_TABLET | Freq: Four times a day (QID) | ORAL | Status: AC
  Administered 2016-01-06 – 2016-01-10 (×16): 1000 mg via ORAL
  Filled 2016-01-05 (×16): qty 2

## 2016-01-05 MED ORDER — ROCURONIUM BROMIDE 50 MG/5ML IV SOLN
INTRAVENOUS | Status: AC
  Filled 2016-01-05: qty 1

## 2016-01-05 MED ORDER — OMEGA-3-ACID ETHYL ESTERS 1 G PO CAPS
1.0000 g | ORAL_CAPSULE | Freq: Two times a day (BID) | ORAL | Status: DC
  Administered 2016-01-05 – 2016-01-17 (×24): 1 g via ORAL
  Filled 2016-01-05 (×24): qty 1

## 2016-01-05 MED ORDER — SENNOSIDES-DOCUSATE SODIUM 8.6-50 MG PO TABS
1.0000 | ORAL_TABLET | Freq: Every day | ORAL | Status: DC
  Administered 2016-01-05 – 2016-01-10 (×2): 1 via ORAL
  Filled 2016-01-05 (×5): qty 1

## 2016-01-05 MED ORDER — LEVALBUTEROL HCL 0.63 MG/3ML IN NEBU
0.6300 mg | INHALATION_SOLUTION | Freq: Two times a day (BID) | RESPIRATORY_TRACT | Status: DC
  Administered 2016-01-06: 0.63 mg via RESPIRATORY_TRACT
  Filled 2016-01-05: qty 3

## 2016-01-05 MED ORDER — SUGAMMADEX SODIUM 200 MG/2ML IV SOLN
INTRAVENOUS | Status: AC
  Filled 2016-01-05: qty 2

## 2016-01-05 MED ORDER — BUPIVACAINE ON-Q PAIN PUMP (FOR ORDER SET NO CHG)
INJECTION | Status: AC
  Filled 2016-01-05: qty 1

## 2016-01-05 MED ORDER — OXYCODONE HCL 5 MG PO TABS
ORAL_TABLET | ORAL | Status: AC
  Filled 2016-01-05: qty 2

## 2016-01-05 MED ORDER — SODIUM CHLORIDE 0.9% FLUSH: 9.0000 mL | INTRAVENOUS | Status: DC | PRN

## 2016-01-05 MED ORDER — OXYCODONE HCL 5 MG PO TABS
5.0000 mg | ORAL_TABLET | ORAL | Status: DC | PRN
  Administered 2016-01-06: 5 mg via ORAL
  Administered 2016-01-07 – 2016-01-09 (×7): 10 mg via ORAL
  Administered 2016-01-09: 5 mg via ORAL
  Administered 2016-01-09 – 2016-01-17 (×22): 10 mg via ORAL
  Filled 2016-01-05 (×3): qty 2
  Filled 2016-01-05: qty 1
  Filled 2016-01-05 (×28): qty 2

## 2016-01-05 MED ORDER — ONDANSETRON HCL 4 MG/2ML IJ SOLN: 4.0000 mg | Freq: Four times a day (QID) | INTRAMUSCULAR | Status: DC | PRN

## 2016-01-05 MED ORDER — VANCOMYCIN HCL IN DEXTROSE 1-5 GM/200ML-% IV SOLN
1000.0000 mg | Freq: Two times a day (BID) | INTRAVENOUS | Status: AC
  Administered 2016-01-05: 1000 mg via INTRAVENOUS
  Filled 2016-01-05: qty 200

## 2016-01-05 MED ORDER — BUPIVACAINE HCL (PF) 0.5 % IJ SOLN
INTRAMUSCULAR | Status: AC
  Filled 2016-01-05: qty 10

## 2016-01-05 MED ORDER — POTASSIUM CHLORIDE IN NACL 20-0.9 MEQ/L-% IV SOLN
INTRAVENOUS | Status: DC
  Administered 2016-01-05: 100 mL/h via INTRAVENOUS
  Administered 2016-01-06: 04:00:00 via INTRAVENOUS
  Filled 2016-01-05 (×2): qty 1000

## 2016-01-05 MED ORDER — DIPHENHYDRAMINE HCL 50 MG/ML IJ SOLN: 12.5000 mg | Freq: Four times a day (QID) | INTRAMUSCULAR | Status: DC | PRN

## 2016-01-05 MED ORDER — LIDOCAINE HCL (CARDIAC) 20 MG/ML IV SOLN
INTRAVENOUS | Status: DC | PRN
  Administered 2016-01-05: 100 mg via INTRAVENOUS

## 2016-01-05 MED ORDER — SUCCINYLCHOLINE CHLORIDE 20 MG/ML IJ SOLN
INTRAMUSCULAR | Status: AC
  Filled 2016-01-05: qty 1

## 2016-01-05 MED ORDER — FENTANYL CITRATE (PF) 250 MCG/5ML IJ SOLN
INTRAMUSCULAR | Status: AC
  Filled 2016-01-05: qty 5

## 2016-01-05 MED ORDER — CHOLECALCIFEROL 10 MCG (400 UNIT) PO TABS
400.0000 [IU] | ORAL_TABLET | Freq: Every day | ORAL | Status: DC
  Administered 2016-01-06 – 2016-01-11 (×6): 400 [IU] via ORAL
  Filled 2016-01-05 (×8): qty 1

## 2016-01-05 MED ORDER — TRAMADOL HCL 50 MG PO TABS
50.0000 mg | ORAL_TABLET | Freq: Four times a day (QID) | ORAL | Status: DC | PRN
  Administered 2016-01-05: 100 mg via ORAL
  Filled 2016-01-05: qty 2

## 2016-01-05 MED ORDER — PROPOFOL 10 MG/ML IV BOLUS
INTRAVENOUS | Status: AC
  Filled 2016-01-05: qty 20

## 2016-01-05 MED ORDER — ONDANSETRON HCL 4 MG/2ML IJ SOLN
INTRAMUSCULAR | Status: AC
  Filled 2016-01-05: qty 2

## 2016-01-05 MED ORDER — FENTANYL CITRATE (PF) 250 MCG/5ML IJ SOLN
INTRAMUSCULAR | Status: DC | PRN
  Administered 2016-01-05: 100 ug via INTRAVENOUS
  Administered 2016-01-05: 25 ug via INTRAVENOUS
  Administered 2016-01-05: 50 ug via INTRAVENOUS
  Administered 2016-01-05 (×3): 25 ug via INTRAVENOUS

## 2016-01-05 MED ORDER — HEMOSTATIC AGENTS (NO CHARGE) OPTIME
TOPICAL | Status: DC | PRN
  Administered 2016-01-05: 1

## 2016-01-05 MED ORDER — PROMETHAZINE HCL 25 MG/ML IJ SOLN: 6.2500 mg | INTRAMUSCULAR | Status: DC | PRN

## 2016-01-05 MED ORDER — MIDAZOLAM HCL 2 MG/2ML IJ SOLN
INTRAMUSCULAR | Status: AC
  Filled 2016-01-05: qty 2

## 2016-01-05 MED ORDER — AMLODIPINE BESYLATE 5 MG PO TABS: 5.0000 mg | ORAL_TABLET | Freq: Every morning | ORAL | Status: DC

## 2016-01-05 MED ORDER — ACETAMINOPHEN 160 MG/5ML PO SOLN
1000.0000 mg | Freq: Four times a day (QID) | ORAL | Status: AC
  Administered 2016-01-05: 1000 mg via ORAL
  Filled 2016-01-05: qty 40.6

## 2016-01-05 MED ORDER — LACTATED RINGERS IV SOLN
INTRAVENOUS | Status: DC
  Administered 2016-01-05 (×2): via INTRAVENOUS

## 2016-01-05 MED ORDER — BUPIVACAINE 0.5 % ON-Q PUMP SINGLE CATH 400 ML
INJECTION | Status: DC | PRN
  Administered 2016-01-05: 400 mL

## 2016-01-05 MED ORDER — METOCLOPRAMIDE HCL 5 MG/ML IJ SOLN
10.0000 mg | Freq: Four times a day (QID) | INTRAMUSCULAR | Status: AC
  Administered 2016-01-05 – 2016-01-06 (×4): 10 mg via INTRAVENOUS
  Filled 2016-01-05 (×4): qty 2

## 2016-01-05 MED ORDER — 0.9 % SODIUM CHLORIDE (POUR BTL) OPTIME
TOPICAL | Status: DC | PRN
  Administered 2016-01-05: 2000 mL

## 2016-01-05 MED ORDER — LEVALBUTEROL HCL 0.63 MG/3ML IN NEBU: 0.6300 mg | INHALATION_SOLUTION | Freq: Four times a day (QID) | RESPIRATORY_TRACT | Status: DC | PRN

## 2016-01-05 MED ORDER — HYDROMORPHONE 1 MG/ML IV SOLN
INTRAVENOUS | Status: AC
  Filled 2016-01-05: qty 25

## 2016-01-05 MED ORDER — SUGAMMADEX SODIUM 200 MG/2ML IV SOLN
INTRAVENOUS | Status: DC | PRN
  Administered 2016-01-05: 130 mg via INTRAVENOUS

## 2016-01-05 MED ORDER — DEXAMETHASONE SODIUM PHOSPHATE 4 MG/ML IJ SOLN
INTRAMUSCULAR | Status: AC
  Filled 2016-01-05: qty 1

## 2016-01-05 MED ORDER — PROPOFOL 10 MG/ML IV BOLUS
INTRAVENOUS | Status: DC | PRN
  Administered 2016-01-05: 120 mg via INTRAVENOUS

## 2016-01-05 MED ORDER — ONDANSETRON HCL 4 MG/2ML IJ SOLN
INTRAMUSCULAR | Status: DC | PRN
  Administered 2016-01-05: 4 mg via INTRAVENOUS

## 2016-01-05 MED ORDER — FENTANYL 40 MCG/ML IV SOLN
INTRAVENOUS | Status: AC
  Filled 2016-01-05: qty 25

## 2016-01-05 MED ORDER — ONDANSETRON HCL 4 MG/2ML IJ SOLN
4.0000 mg | Freq: Four times a day (QID) | INTRAMUSCULAR | Status: DC | PRN
  Administered 2016-01-05: 4 mg via INTRAVENOUS

## 2016-01-05 MED ORDER — ADULT MULTIVITAMIN W/MINERALS CH
1.0000 | ORAL_TABLET | Freq: Every evening | ORAL | Status: DC
  Administered 2016-01-06 – 2016-01-16 (×11): 1 via ORAL
  Filled 2016-01-05 (×11): qty 1

## 2016-01-05 MED ORDER — DIPHENHYDRAMINE HCL 12.5 MG/5ML PO ELIX: 12.5000 mg | ORAL_SOLUTION | Freq: Four times a day (QID) | ORAL | Status: DC | PRN

## 2016-01-05 MED ORDER — HYDROMORPHONE HCL 1 MG/ML IJ SOLN: 0.2500 mg | INTRAMUSCULAR | Status: DC | PRN

## 2016-01-05 MED ORDER — EPHEDRINE SULFATE 50 MG/ML IJ SOLN
INTRAMUSCULAR | Status: DC | PRN
  Administered 2016-01-05 (×4): 5 mg via INTRAVENOUS
  Administered 2016-01-05: 10 mg via INTRAVENOUS

## 2016-01-05 MED ORDER — PHENYLEPHRINE 40 MCG/ML (10ML) SYRINGE FOR IV PUSH (FOR BLOOD PRESSURE SUPPORT)
PREFILLED_SYRINGE | INTRAVENOUS | Status: AC
  Filled 2016-01-05: qty 10

## 2016-01-05 MED ORDER — ROCURONIUM BROMIDE 100 MG/10ML IV SOLN
INTRAVENOUS | Status: DC | PRN
  Administered 2016-01-05: 30 mg via INTRAVENOUS
  Administered 2016-01-05: 10 mg via INTRAVENOUS
  Administered 2016-01-05: 20 mg via INTRAVENOUS
  Administered 2016-01-05 (×3): 10 mg via INTRAVENOUS

## 2016-01-05 MED ORDER — DEXTROSE 5 % IV SOLN
1.5000 g | Freq: Two times a day (BID) | INTRAVENOUS | Status: AC
  Administered 2016-01-05 – 2016-01-06 (×2): 1.5 g via INTRAVENOUS
  Filled 2016-01-05 (×3): qty 1.5

## 2016-01-05 SURGICAL SUPPLY — 95 items
BENZOIN TINCTURE PRP APPL 2/3 (GAUZE/BANDAGES/DRESSINGS) ×5 IMPLANT
CANISTER SUCTION 2500CC (MISCELLANEOUS) ×10 IMPLANT
CATH KIT ON Q 5IN SLV (PAIN MANAGEMENT) IMPLANT
CATH KIT ON-Q SILVERSOAK 5IN (CATHETERS) ×5 IMPLANT
CATH THORACIC 28FR (CATHETERS) ×5 IMPLANT
CATH THORACIC 36FR (CATHETERS) IMPLANT
CATH THORACIC 36FR RT ANG (CATHETERS) IMPLANT
CLIP TI MEDIUM 6 (CLIP) ×5 IMPLANT
CLIP TI WIDE RED SMALL 6 (CLIP) ×5 IMPLANT
CLOSURE STERI-STRIP 1/2X4 (GAUZE/BANDAGES/DRESSINGS) ×1
CLSR STERI-STRIP ANTIMIC 1/2X4 (GAUZE/BANDAGES/DRESSINGS) ×4 IMPLANT
CONN ST 1/4X3/8  BEN (MISCELLANEOUS) ×2
CONN ST 1/4X3/8 BEN (MISCELLANEOUS) ×3 IMPLANT
CONN Y 3/8X3/8X3/8  BEN (MISCELLANEOUS)
CONN Y 3/8X3/8X3/8 BEN (MISCELLANEOUS) IMPLANT
CONT SPEC 4OZ CLIKSEAL STRL BL (MISCELLANEOUS) ×15 IMPLANT
COVER SURGICAL LIGHT HANDLE (MISCELLANEOUS) ×5 IMPLANT
CUTTER ECHEON FLEX ENDO 45 340 (ENDOMECHANICALS) ×5 IMPLANT
DERMABOND ADVANCED (GAUZE/BANDAGES/DRESSINGS) ×2
DERMABOND ADVANCED .7 DNX12 (GAUZE/BANDAGES/DRESSINGS) ×3 IMPLANT
DRAIN CHANNEL 28F RND 3/8 FF (WOUND CARE) ×5 IMPLANT
DRAIN CHANNEL 32F RND 10.7 FF (WOUND CARE) IMPLANT
DRAPE LAPAROSCOPIC ABDOMINAL (DRAPES) ×5 IMPLANT
DRAPE WARM FLUID 44X44 (DRAPE) ×5 IMPLANT
DRSG TEGADERM 4X4.75 (GAUZE/BANDAGES/DRESSINGS) ×5 IMPLANT
ELECT BLADE 6.5 EXT (BLADE) ×5 IMPLANT
ELECT REM PT RETURN 9FT ADLT (ELECTROSURGICAL) ×5
ELECTRODE REM PT RTRN 9FT ADLT (ELECTROSURGICAL) ×3 IMPLANT
GAUZE SPONGE 4X4 12PLY STRL (GAUZE/BANDAGES/DRESSINGS) ×5 IMPLANT
GAUZE XEROFORM 1X8 LF (GAUZE/BANDAGES/DRESSINGS) ×10 IMPLANT
GLOVE SURG SIGNA 7.5 PF LTX (GLOVE) ×10 IMPLANT
GOWN STRL REUS W/ TWL LRG LVL3 (GOWN DISPOSABLE) ×6 IMPLANT
GOWN STRL REUS W/ TWL XL LVL3 (GOWN DISPOSABLE) ×6 IMPLANT
GOWN STRL REUS W/TWL LRG LVL3 (GOWN DISPOSABLE) ×4
GOWN STRL REUS W/TWL XL LVL3 (GOWN DISPOSABLE) ×8
HANDLE STAPLE ENDO GIA SHORT (STAPLE)
HEMOSTAT SURGICEL 2X14 (HEMOSTASIS) IMPLANT
KIT BASIN OR (CUSTOM PROCEDURE TRAY) ×5 IMPLANT
KIT ROOM TURNOVER OR (KITS) ×5 IMPLANT
KIT SUCTION CATH 14FR (SUCTIONS) ×5 IMPLANT
NS IRRIG 1000ML POUR BTL (IV SOLUTION) ×15 IMPLANT
PACK CHEST (CUSTOM PROCEDURE TRAY) ×5 IMPLANT
PAD ARMBOARD 7.5X6 YLW CONV (MISCELLANEOUS) ×10 IMPLANT
POUCH ENDO CATCH II 15MM (MISCELLANEOUS) ×5 IMPLANT
POUCH SPECIMEN RETRIEVAL 10MM (ENDOMECHANICALS) ×5 IMPLANT
RELOAD GOLD ECHELON 45 (STAPLE) ×70 IMPLANT
RELOAD GREEN ECHELON 45 (STAPLE) ×5 IMPLANT
SCISSORS ENDO CVD 5DCS (MISCELLANEOUS) IMPLANT
SEALANT PROGEL (MISCELLANEOUS) ×5 IMPLANT
SEALANT SURG COSEAL 4ML (VASCULAR PRODUCTS) IMPLANT
SEALANT SURG COSEAL 8ML (VASCULAR PRODUCTS) IMPLANT
SLEEVE SURGEON STRL (DRAPES) ×5 IMPLANT
SOLUTION ANTI FOG 6CC (MISCELLANEOUS) ×5 IMPLANT
SPECIMEN JAR MEDIUM (MISCELLANEOUS) ×5 IMPLANT
SPONGE GAUZE 4X4 12PLY STER LF (GAUZE/BANDAGES/DRESSINGS) ×5 IMPLANT
SPONGE INTESTINAL PEANUT (DISPOSABLE) ×20 IMPLANT
SPONGE LAP 18X18 X RAY DECT (DISPOSABLE) ×5 IMPLANT
SPONGE TONSIL 1 RF SGL (DISPOSABLE) ×5 IMPLANT
STAPLE RELOAD 2.5MM WHITE (STAPLE) ×20 IMPLANT
STAPLER ENDO GIA 12MM SHORT (STAPLE) IMPLANT
STAPLER VASCULAR ECHELON 35 (CUTTER) ×5 IMPLANT
STAPLER VISISTAT 35W (STAPLE) ×5 IMPLANT
SUT PROLENE 4 0 RB 1 (SUTURE) ×3
SUT PROLENE 4-0 RB1 .5 CRCL 36 (SUTURE) ×3 IMPLANT
SUT SILK  1 MH (SUTURE) ×8
SUT SILK 1 MH (SUTURE) ×12 IMPLANT
SUT SILK 1 TIES 10X30 (SUTURE) ×15 IMPLANT
SUT SILK 2 0 SH (SUTURE) IMPLANT
SUT SILK 2 0SH CR/8 30 (SUTURE) IMPLANT
SUT SILK 3 0 SH 30 (SUTURE) IMPLANT
SUT SILK 3 0SH CR/8 30 (SUTURE) ×5 IMPLANT
SUT VIC AB 0 CTX 27 (SUTURE) IMPLANT
SUT VIC AB 1 CTX 27 (SUTURE) ×10 IMPLANT
SUT VIC AB 2-0 CT1 27 (SUTURE) ×2
SUT VIC AB 2-0 CT1 TAPERPNT 27 (SUTURE) ×3 IMPLANT
SUT VIC AB 2-0 CTX 36 (SUTURE) ×10 IMPLANT
SUT VIC AB 3-0 MH 27 (SUTURE) IMPLANT
SUT VIC AB 3-0 SH 27 (SUTURE)
SUT VIC AB 3-0 SH 27X BRD (SUTURE) IMPLANT
SUT VIC AB 3-0 X1 27 (SUTURE) ×15 IMPLANT
SUT VICRYL 0 UR6 27IN ABS (SUTURE) ×20 IMPLANT
SUT VICRYL 2 TP 1 (SUTURE) IMPLANT
SWAB COLLECTION DEVICE MRSA (MISCELLANEOUS) IMPLANT
SYSTEM SAHARA CHEST DRAIN ATS (WOUND CARE) ×5 IMPLANT
TAPE CLOTH SURG 4X10 WHT LF (GAUZE/BANDAGES/DRESSINGS) ×5 IMPLANT
TIP APPLICATOR SPRAY EXTEND 16 (VASCULAR PRODUCTS) IMPLANT
TOWEL OR 17X24 6PK STRL BLUE (TOWEL DISPOSABLE) ×5 IMPLANT
TOWEL OR 17X26 10 PK STRL BLUE (TOWEL DISPOSABLE) ×10 IMPLANT
TRAP SPECIMEN MUCOUS 40CC (MISCELLANEOUS) IMPLANT
TRAY FOLEY CATH 16FRSI W/METER (SET/KITS/TRAYS/PACK) ×5 IMPLANT
TROCAR XCEL BLADELESS 5X75MML (TROCAR) ×5 IMPLANT
TROCAR XCEL NON-BLD 5MMX100MML (ENDOMECHANICALS) IMPLANT
TUBE ANAEROBIC SPECIMEN COL (MISCELLANEOUS) IMPLANT
TUNNELER SHEATH ON-Q 11GX8 DSP (PAIN MANAGEMENT) ×5 IMPLANT
WATER STERILE IRR 1000ML POUR (IV SOLUTION) ×5 IMPLANT

## 2016-01-05 NOTE — H&P (View-Only) (Signed)
[address]       [place] [address]             [phone]       HPI: Mrs. Theard returns today to further discuss management of her right lower lobe lung nodules  She is a 82 yo woman with a remote history of breast cancer (1988), hypertension, atrial fibrillation, arthritis and osteoporosis. She is a lifelong nonsmoker. She recently had a physical exam. There was a palpable abdominal mass. A CT of the abdomen and pelvis revealed a possible gastric leiomyoma. The "mass" was likely the left lobe of the liver. Also noted on CT were 2 right lung nodules. A PET CT was done which showed new metabolic activity in the 12 mm RLL nodule that was unchanged. The other nodule was interpreted as being a lung nodule but may actually be pleural based. It had doubled in size over 2 years but metabolic activity was indeterminate due to proximity to the liver.  She denies chest pain, pressure or tightness. Denies F,C,S. Denies change in appetite or weight loss. 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  I saw her in [place] couple of weeks ago and we discussed observation versus surgical resection. She favored resection, but wanted some time to think over her options and discuss further with her family before making a decision. She says she now would like to proceed with surgery.  Zubrod Score: At the time of surgery this patient's most appropriate activity status/level should be described as: '[]'$     0    Normal activity, no symptoms '[x]'$     1    Restricted in physical strenuous activity but ambulatory, able to do out light work '[]'$     2    Ambulatory and capable of self care, unable to do work activities, up and about >50 % of waking hours                              '[]'$     3    Only limited self care, in bed greater than 50% of waking hours '[]'$     4    Completely disabled, no self care, confined to bed or chair '[]'$     5    Moribund  Past Medical History  Diagnosis Date  .  HTN (hypertension)   . Atrial tachycardia ([place])   . Atrial fibrillation ([facility])     on coumadin  . Cancer [facility]) 1988    breast cancer, R mastectomy, chemo  . Hemorrhoids 1991  . Arthritis 1996  . Diverticulitis 1999  . Femur fracture ([place]) 03/2013    left, non weight bearing for 2 1/2 weeks  . Urinary, incontinence, stress female   . Vitamin D deficiency disease   . Osteoporosis    Past Surgical History  Procedure Laterality Date  . Total hip arthroplasty    . Mastectomy partial / lumpectomy w/ axillary lymphadenectomy  04/05/1987    Right - Dr Annamaria Boots  . Breast biopsy  05/04/1987    Right Dr Annamaria Boots  . Tubal ligation Bilateral 1970  . Vaginal hysterectomy  1977    myomata  . Bunionectomy  1988  . Breast implant removal Right 06/05/10  . Eye surgery  5/14 ; 6/14    Cataract Surgery  . Joint replacement      2002 left; 2009 right Hip, rt knee 2012  Current Outpatient Prescriptions  Medication Sig Dispense Refill  . amLODipine (NORVASC) 5 MG tablet Take 5 mg by mouth every morning.    . Calcium Carbonate-Vitamin D (CALTRATE 600+D) 600-400 MG-UNIT per tablet Take 1 tablet by mouth daily.    . carvedilol (COREG) 25 MG tablet Take 25 mg by mouth 2 (two) times daily with a meal.    . Estradiol (VAGIFEM) 10 MCG TABS vaginal tablet Place 1 tablet (10 mcg total) vaginally 2 (two) times a week. 24 tablet 3  . Multiple Vitamin (MULTIVITAMIN WITH MINERALS) TABS Take 1 tablet by mouth every evening.    . Omega-3 Fatty Acids (FISH OIL) 1000 MG CAPS Take 1 capsule by mouth 2 (two) times daily.    Marland Kitchen OVER THE COUNTER MEDICATION Macular degeneration supplement.  2 times daily.  Receives from Dr. Payton Emerald office    . telmisartan (MICARDIS) 80 MG tablet Take 80 mg by mouth every morning.    . warfarin (COUMADIN) 4 MG tablet Take 4-6 mg by mouth every evening. Take '6mg'$  Monday and Wednesday. Take '4mg'$  all the other days of the week.     No current facility-administered medications for this visit.    Social History   Social History  . Marital Status: Widowed    Spouse Name: N/A  . Number of Children: N/A  . Years of Education: N/A   Occupational History  . Not on file.   Social History Main Topics  . Smoking status: Never Smoker   . Smokeless tobacco: Never Used  . Alcohol Use: No  . Drug Use: No  . Sexual Activity: No     Comment: hysterectomy   Other Topics Concern  . Not on file   Social History Narrative   Family History  Problem Relation Age of Onset  . Colon cancer Mother   . Colon cancer Father   . Heart disease Father    Review of Systems  Constitutional: Negative for fever, chills, activity change, appetite change and unexpected weight change.  HENT: Negative for trouble swallowing and voice change.  Eyes: Negative for visual disturbance.  Respiratory: Negative for cough, shortness of breath and wheezing.  Cardiovascular: Positive for palpitations and leg swelling. Negative for chest pain.   Atrial fib  Gastrointestinal: Negative for abdominal pain and blood in stool.   Stomach mass "muscular"  Genitourinary: Negative for hematuria and difficulty urinating.  Musculoskeletal: Positive for joint swelling and arthralgias. Negative for gait problem.  Neurological: Negative for dizziness, seizures, syncope, weakness and headaches.  Hematological: Negative for adenopathy. Bruises/bleeds easily (on coumadin).  All other systems reviewed and are negative. Unchanged from last visit  Physical Exam  BP 131/71 mmHg  Pulse 63  Resp 20  Ht '5\' 1"'$  (1.549 m)  Wt 140 lb (63.504 kg)  BMI 26.47 kg/m2  SpO2 98%  LMP 10/16/1975 (Approximate)  Constitutional: She is oriented to person, place, and time. She appears well-developed and well-nourished. No distress.  HENT:  Head: Normocephalic and atraumatic.  Eyes: Conjunctivae and EOM are normal. Pupils are equal, round, and reactive to light. No scleral icterus.  Neck: Neck supple. No thyromegaly  present.  Cardiovascular: Normal rate, regular rhythm and normal heart sounds. Exam reveals no gallop and no friction rub.  No murmur heard. Pulmonary/Chest: Effort normal and breath sounds normal. No respiratory distress. She has no wheezes. She has no rales.  Abdominal: Soft. She exhibits mass (liver palpable to left of midline). She exhibits no distension. There is no tenderness.  Musculoskeletal:  She exhibits no edema.  Lymphadenopathy:   She has no cervical adenopathy.  Neurological: She is alert and oriented to person, place, and time. No cranial nerve deficit.  No focal motor deficit  Skin: Skin is warm and dry.   Diagnostic Tests: NUCLEAR MEDICINE PET SKULL BASE TO THIGH  TECHNIQUE: 8.2 mCi F-18 FDG was injected intravenously. Full-ring PET imaging was performed from the skull base to thigh after the radiotracer. CT data was obtained and used for attenuation correction and anatomic localization.  FASTING BLOOD GLUCOSE: Value: 98 mg/dl  COMPARISON: 04/29/2013  FINDINGS: NECK  No hypermetabolic lymph nodes in the neck.  CHEST  No hypermetabolic mediastinal or hilar nodes. Surgical clips seen in right axilla, however there is no evidence hypermetabolic axillary lymph nodes.  A 12 mm pulmonary nodule in the medial right lower lobe on image 56/series 8 remains stable in size but now shows hypermetabolic activity, with SUV max of 2.5.  Another pulmonary nodule in the anterior right lower lobe measures 20 mm on image 56/series 8 compared to 12 mm previously. This appears to show mild hypermetabolic activity, although it is difficult to separate from adjacent activity in the dome of the liver.  Other scattered sub-cm pulmonary nodules are seen bilaterally which are stable and without metabolic activity, but are too small to characterize by PET. No evidence of pleural effusion.  ABDOMEN/PELVIS  No abnormal hypermetabolic activity within the liver,  pancreas, adrenal glands, or spleen. Small cyst in the dome of the right hepatic lobe shows no associated metabolic activity. No hypermetabolic lymph nodes in the abdomen or pelvis.  SKELETON  No focal hypermetabolic activity to suggest skeletal metastasis. Bilateral hip prostheses result in artifact through the lower pelvis.  IMPRESSION: Increased size of 2 cm right lower lobe nodule and stable 12 mm right lower lobe nodule, both of which show metabolic activity on today's study. These are highly suspicious for pulmonary metastases.  No other sites of metastatic disease identified.   Electronically Signed  By: Earle Gell M.D.  On: 11/16/2015 16:36  I again personally reviewed the CT and PET CT and concur with finding as noted above  Impression: 82 year old woman who is a lifelong nonsmoker with a 12 mm spiculated mass in the right lower lobe that is mildly hypermetabolic on PET. This is highly suspicious for a low-grade adenocarcinoma. Other possibilities include infectious or inflammatory etiologies. There also is a smoothly marginated mass that appears to be arising from the pleural surface on the diaphragm. This was indeterminate on PET due to proximity to the liver.  My recommendation was that we proceed with right VATS, wedge resection right lower lobe nodule and removal of the pleural based nodule, and possible segmentectomy (if the right lower lobe nodule is cancer). I discussed the general nature of the procedure with the patient and her daughter. We reviewed the need for general anesthesia, the incisions to be used, the use of drainage tubes postoperatively, the expected hospital stay, and the overall recovery. I reviewed the indications, risks, benefits, and alternatives. They understand the risk include but are not limited to death, MI, DVT, PE, bleeding, possible need for transfusion, infection, prolonged air leak, cardiac arrhythmias, as well as the possibility of  other unforeseeable complications.  She accepts the risks and wishes to proceed.  She is on Coumadin for atrial fibrillation. She needs to be off that for 5 days prior to surgery.  Plan: Right VATS, wedge resection right lower lobe nodule, removal of pleural-based nodule,  possible segmentectomy on Thursday, March 23. She will be admitted on the day of surgery.  She will hold Coumadin after her dose on March 17.  I spent 25 minutes with Mrs. Treloar during this visit, greater than 50% spent in counseling Melrose Nakayama, MD [facility] [phone]

## 2016-01-05 NOTE — Anesthesia Procedure Notes (Addendum)
Central Venous Catheter Insertion Performed by: anesthesiologist Patient location: Pre-op. Preanesthetic checklist: patient identified, IV checked, site marked, risks and benefits discussed, surgical consent, monitors and equipment checked, pre-op evaluation, timeout performed and anesthesia consent Position: Trendelenburg Lidocaine 1% used for infiltration Landmarks identified and Seldinger technique used Catheter size: 8 Fr Central line was placed.Double lumen Procedure performed using ultrasound guided technique. Attempts: 1 Following insertion, dressing applied, line sutured and Biopatch. Post procedure assessment: blood return through all ports. Patient tolerated the procedure well with no immediate complications.   Procedure Name: Intubation Date/Time: 01/05/2016 10:01 AM Performed by: Sampson Si E Pre-anesthesia Checklist: Patient identified, Emergency Drugs available, Suction available, Patient being monitored and Timeout performed Patient Re-evaluated:Patient Re-evaluated prior to inductionOxygen Delivery Method: Circle system utilized Preoxygenation: Pre-oxygenation with 100% oxygen Intubation Type: IV induction Ventilation: Mask ventilation without difficulty, Two handed mask ventilation required and Oral airway inserted - appropriate to patient size Laryngoscope Size: Miller and 2 Grade View: Grade I Endobronchial tube: Left, Double lumen EBT, EBT position confirmed by auscultation and EBT position confirmed by fiberoptic bronchoscope and 35 Fr Number of attempts: 2 Airway Equipment and Method: Stylet Placement Confirmation: ETT inserted through vocal cords under direct vision,  positive ETCO2 and breath sounds checked- equal and bilateral Secured at: 29 cm Tube secured with: Tape Dental Injury: Teeth and Oropharynx as per pre-operative assessment  Comments: 1st attempt by SRNA, Grade 1 view, unable to insert tube, 2nd attempt by MDA, Grade 1 view atraumatic, successful  intubation.

## 2016-01-05 NOTE — Interval H&P Note (Signed)
History and Physical Interval Note:  INR 1.32  She is aware of risks and benefits   01/05/2016 9:26 AM  [name]  has presented today for surgery, with the diagnosis of RLL LUNG NODULE PLEURAL NODULE  The various methods of treatment have been discussed with the patient and family. After consideration of risks, benefits and other options for treatment, the patient has consented to  Procedure(s): VIDEO ASSISTED THORACOSCOPY (VATS)/WEDGE RESECTION (Right) POSSIBLE SEGMENTECTOMY (N/A) RESECTION OF PLEURAL MASS (N/A) as a surgical intervention .  The patient's history has been reviewed, patient examined, no change in status, stable for surgery.  I have reviewed the patient's chart and labs.  Questions were answered to the patient's satisfaction.     Melrose Nakayama

## 2016-01-05 NOTE — Transfer of Care (Signed)
Immediate Anesthesia Transfer of Care Note  Patient: [name]  Procedure(s) Performed: Procedure(s): RIGHT VIDEO ASSISTED THORACOSCOPY (VATS)/WEDGE RESECTION (Right) RESECTION OF PLEURAL MASS, right (N/A) RIGHT LOWER LOBE LOBECTOMY (Right)  Patient Location: PACU  Anesthesia Type:General  Level of Consciousness: awake  Airway & Oxygen Therapy: Patient Spontanous Breathing and Patient connected to nasal cannula oxygen  Post-op Assessment: Report given to RN  Post vital signs: Reviewed and stable  Last Vitals:  Filed Vitals:   01/05/16 0804  BP: 138/69  Pulse: 63  Temp: 36.6 C  Resp: 18    Complications: No apparent anesthesia complications

## 2016-01-05 NOTE — Anesthesia Preprocedure Evaluation (Addendum)
Anesthesia Evaluation  Patient identified by MRN, date of birth, ID band Patient awake    Reviewed: Allergy & Precautions, NPO status , Patient's Chart, lab work & pertinent test results, reviewed documented beta blocker date and time   Airway Mallampati: II  TM Distance: >3 FB Neck ROM: Full    Dental  (+) Teeth Intact, Dental Advisory Given   Pulmonary neg pulmonary ROS,    breath sounds clear to auscultation       Cardiovascular hypertension, Pt. on medications and Pt. on home beta blockers  Rhythm:Irregular Rate:Normal     Neuro/Psych negative neurological ROS     GI/Hepatic Neg liver ROS, GERD  ,  Endo/Other  negative endocrine ROS  Renal/GU negative Renal ROS     Musculoskeletal  (+) Arthritis ,   Abdominal   Peds  Hematology negative hematology ROS (+)   Anesthesia Other Findings   Reproductive/Obstetrics                           Lab Results  Component Value Date   WBC 4.1 01/03/2016   HGB 12.5 01/03/2016   HCT 39.2 01/03/2016   MCV 95.4 01/03/2016   PLT 123* 01/03/2016   Lab Results  Component Value Date   CREATININE 0.68 01/03/2016   BUN 10 01/03/2016   NA 136 01/03/2016   K 4.3 01/03/2016   CL 102 01/03/2016   CO2 22 01/03/2016    Anesthesia Physical Anesthesia Plan  ASA: III  Anesthesia Plan: General   Post-op Pain Management:    Induction: Intravenous  Airway Management Planned: Oral ETT  Additional Equipment: Arterial line, CVP and Ultrasound Guidance Line Placement  Intra-op Plan:   Post-operative Plan: Extubation in OR  Informed Consent: I have reviewed the patients History and Physical, chart, labs and discussed the procedure including the risks, benefits and alternatives for the proposed anesthesia with the patient or authorized representative who has indicated his/her understanding and acceptance.   Dental advisory given  Plan Discussed with:  CRNA  Anesthesia Plan Comments:         Anesthesia Quick Evaluation

## 2016-01-05 NOTE — Anesthesia Postprocedure Evaluation (Signed)
Anesthesia Post Note  Patient: [name]  Procedure(s) Performed: Procedure(s) (LRB): RIGHT VIDEO ASSISTED THORACOSCOPY (VATS)/WEDGE RESECTION (Right) RESECTION OF PLEURAL MASS, right (N/A) RIGHT LOWER LOBE LOBECTOMY (Right)  Patient location during evaluation: PACU Anesthesia Type: General Level of consciousness: awake and alert Pain management: pain level controlled Vital Signs Assessment: post-procedure vital signs reviewed and stable Respiratory status: spontaneous breathing, nonlabored ventilation, respiratory function stable and patient connected to nasal cannula oxygen Cardiovascular status: blood pressure returned to baseline and stable Postop Assessment: no signs of nausea or vomiting Anesthetic complications: no    Last Vitals:  Filed Vitals:   01/05/16 1605 01/05/16 1610  BP: 122/83   Pulse: 67 66  Temp: 36.4 C   Resp:  21    Last Pain:  Filed Vitals:   01/05/16 1610  PainSc: Asleep                 Tiajuana Amass

## 2016-01-05 NOTE — Brief Op Note (Addendum)
01/05/2016  2:12 PM  PATIENT:  [name]  82 y.o. female  PRE-OPERATIVE DIAGNOSIS:   1. RIGHT LOWER LOBE LUNG NODULE 2. PLEURAL NODULE  POST-OPERATIVE DIAGNOSIS:   1. 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 PLEURAL CYST  PROCEDURE:  Procedure(s):  RIGHT VIDEO ASSISTED THORACOSCOPY (VATS) -Wedge Resection RLL -Thoracoscopic Right Lower Lobectomy -Resection of Diaphramatic/Pleural Cyst -Insertion of ON-Q Local Anesthetic Catheter  SURGEON:  Surgeon(s) and Role:    * Melrose Nakayama, MD - Primary  PHYSICIAN ASSISTANT: Erin Barrett PA-C  ANESTHESIA:   general  EBL:  Total I/O In: 1500 [I.V.:1500] Out: 1280 [Urine:880; Blood:400]  BLOOD ADMINISTERED:none  DRAINS: 28 Blake, 28 Straight   LOCAL MEDICATIONS USED:  MARCAINE     SPECIMEN:  Source of Specimen:  RLL Wedge, RLL, Cyst Diaphragm  DISPOSITION OF SPECIMEN:  PATHOLOGY  COUNTS:  YES  PLAN OF CARE: Admit to inpatient   PATIENT DISPOSITION:  PACU - hemodynamically stable.   Delay start of Pharmacological VTE agent (>24hrs) due to surgical blood loss or risk of bleeding: yes  FINDINGS:  Fluid filled cyst arising from central portion of diaphragm. RLL nodule + carcinoma. Right lower lobe basilar segmentectomy attempted but converted to lobectomy

## 2016-01-05 NOTE — Progress Notes (Signed)
Utilization review completed.  

## 2016-01-06 ENCOUNTER — Encounter (HOSPITAL_COMMUNITY): Payer: Self-pay | Admitting: Thoracic Surgery (Cardiothoracic Vascular Surgery)

## 2016-01-06 ENCOUNTER — Inpatient Hospital Stay (HOSPITAL_COMMUNITY): Payer: Medicare Other

## 2016-01-06 LAB — BASIC METABOLIC PANEL
Anion gap: 7 (ref 5–15)
BUN: 10 mg/dL (ref 6–20)
CALCIUM: 7.8 mg/dL — AB (ref 8.9–10.3)
CHLORIDE: 105 mmol/L (ref 101–111)
CO2: 23 mmol/L (ref 22–32)
CREATININE: 0.7 mg/dL (ref 0.44–1.00)
GFR calc Af Amer: >60 mL/min (ref >60)
GFR calc non Af Amer: >60 mL/min (ref >60)
Glucose, Bld: 118 mg/dL — ABNORMAL HIGH (ref 65–99)
Potassium: 4.4 mmol/L (ref 3.5–5.1)
SODIUM: 135 mmol/L (ref 135–145)

## 2016-01-06 LAB — BLOOD GAS, ARTERIAL
ACID-BASE DEFICIT: 1.2 mmol/L (ref 0.0–2.0)
BICARBONATE: 23.5 meq/L (ref 20.0–24.0)
O2 CONTENT: 2 L/min
O2 SAT: 97.9 %
PATIENT TEMPERATURE: 98.2
TCO2: 24.8 mmol/L (ref 0–100)
pCO2 arterial: 42.5 mmHg (ref 35.0–45.0)
pH, Arterial: 7.36 (ref 7.350–7.450)
pO2, Arterial: 108 mmHg — ABNORMAL HIGH (ref 80.0–100.0)

## 2016-01-06 LAB — CBC
HCT: 30.8 % — ABNORMAL LOW (ref 36.0–46.0)
Hemoglobin: 10.5 g/dL — ABNORMAL LOW (ref 12.0–15.0)
MCH: 32.7 pg (ref 26.0–34.0)
MCHC: 34.1 g/dL (ref 30.0–36.0)
MCV: 96 fL (ref 78.0–100.0)
PLATELETS: 123 10*3/uL — AB (ref 150–400)
RBC: 3.21 MIL/uL — AB (ref 3.87–5.11)
RDW: 14.5 % (ref 11.5–15.5)
WBC: 8.9 10*3/uL (ref 4.0–10.5)

## 2016-01-06 LAB — PROTIME-INR
INR: 1.46 (ref 0.00–1.49)
PROTHROMBIN TIME: 17.8 s — AB (ref 11.6–15.2)

## 2016-01-06 MED ORDER — ENOXAPARIN SODIUM 40 MG/0.4ML ~~LOC~~ SOLN
40.0000 mg | SUBCUTANEOUS | Status: DC
  Administered 2016-01-07 – 2016-01-09 (×3): 40 mg via SUBCUTANEOUS
  Filled 2016-01-06 (×3): qty 0.4

## 2016-01-06 MED ORDER — WARFARIN SODIUM 6 MG PO TABS
6.0000 mg | ORAL_TABLET | ORAL | Status: DC
  Administered 2016-01-06 – 2016-01-13 (×4): 6 mg via ORAL
  Filled 2016-01-06 (×4): qty 1

## 2016-01-06 MED ORDER — WARFARIN SODIUM 4 MG PO TABS
4.0000 mg | ORAL_TABLET | ORAL | Status: DC
  Administered 2016-01-07 – 2016-01-14 (×5): 4 mg via ORAL
  Filled 2016-01-06 (×5): qty 1

## 2016-01-06 MED ORDER — CARVEDILOL 12.5 MG PO TABS
12.5000 mg | ORAL_TABLET | Freq: Two times a day (BID) | ORAL | Status: DC
  Administered 2016-01-06 – 2016-01-15 (×16): 12.5 mg via ORAL
  Filled 2016-01-06 (×18): qty 1

## 2016-01-06 MED ORDER — WARFARIN SODIUM 4 MG PO TABS: 4.0000 mg | ORAL_TABLET | Freq: Every evening | ORAL | Status: DC

## 2016-01-06 MED ORDER — IRBESARTAN 150 MG PO TABS
150.0000 mg | ORAL_TABLET | Freq: Every day | ORAL | Status: DC
  Administered 2016-01-07 – 2016-01-08 (×2): 150 mg via ORAL
  Filled 2016-01-06 (×5): qty 1

## 2016-01-06 MED ORDER — WARFARIN - PHYSICIAN DOSING INPATIENT
Freq: Every day | Status: DC
  Administered 2016-01-09 – 2016-01-11 (×3)
  Administered 2016-01-12: 1

## 2016-01-06 MED ORDER — TRAMADOL HCL 50 MG PO TABS
50.0000 mg | ORAL_TABLET | Freq: Four times a day (QID) | ORAL | Status: DC | PRN
  Administered 2016-01-08 – 2016-01-16 (×6): 50 mg via ORAL
  Filled 2016-01-06 (×7): qty 1

## 2016-01-06 NOTE — Progress Notes (Signed)
Accepted care for pt from [facility]. Pt is alert and oriented, daughter is at bedside. Vital signs are stable, chest tube dressings changed and a-line removed by Merrily Brittle. Bed is in low position, bed alarm is on and pt is wearing yellow fall bracelet. Consuelo Pandy RN

## 2016-01-06 NOTE — Discharge Summary (Addendum)
Physician Discharge Summary       [address]       [place] [address]             [phone]    Patient ID: [name] MRN: [id number] DOB/AGE: 1934/05/29 82 y.o.  Admit date: 01/05/2016 Discharge date: 01/18/2016  Admission Diagnoses: 1. Right lower lobe nodule 2. Pleural nodule   Active Diagnoses:  1. 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 Pleural cyst 3. HTN (hypertension) 4. Atrial tachycardia ([place]) 5. Atrial fibrillation ([place]). On Coumadin 6. Cancer (HCC)breast cancer, R mastectomy, chemo 7. Diverticulitis 8. Femur fracture ([place]) 9. Urinary, incontinence, stress  10. Vitamin D deficiency disease 11. Osteoporosis   Procedure (s):  RIGHT VIDEO ASSISTED THORACOSCOPY (VATS) -Wedge Resection RLL -Thoracoscopic Right Lower Lobectomy -Resection of Diaphramatic/Pleural Cyst -Insertion of ON-Q Local Anesthetic Catheter by Dr. Roxan Hockey on 01/05/2016.  Pathology: 1. Lung, wedge biopsy/resection, Right lower - CARCINOID TUMOR, 1.4 CM. - TUMOR IS PRESENT AT THE MARGIN OF SPECIMEN #1. - SEE ONCOLOGY TABLE BELOW. 2. Lung, resection (segmental or lobe), Right Lower - CARCINOID TUMORLET(S). - THE SURGICAL RESECTION MARGINS ARE NEGATIVE FOR TUMOR. 3. Cyst, excision, Diaphragm - MILDLY INFLAMED MESOTHELIAL LINED CYST. - THERE IS NO EVIDENCE OF MALIGNANCY TNM code: pT1a, pNX  History of Presenting Illness: This is a 82 yo woman with a remote history of breast cancer (1988), hypertension, atrial fibrillation, arthritis and osteoporosis. She is a lifelong nonsmoker. She recently had a physical exam. There was a palpable abdominal mass. A CT of the abdomen and pelvis revealed a possible gastric leiomyoma. The "mass" was likely the left lobe of the liver. Also noted on CT were 2 right lung nodules. A PET CT was done which showed new metabolic activity in the 12 mm RLL nodule that was unchanged in size. The other nodule was interpreted as  being a lung nodule but may actually be pleural based. It had doubled in size over 2 years but metabolic activity was indeterminate due to proximity to the liver.  She denies chest pain, pressure or tightness. She denies fever or chills. Denies change in appetite or weight loss. 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  Dr. Roxan Hockey saw her in [place] couple of weeks ago and they discussed observation versus surgical resection. She favored resection, but wanted some time to think over her options and discuss further with her family before making a decision. She says she now would like to proceed with surgery. Dr. Roxan Hockey recommended was that she proceed with right VATS, wedge resection right lower lobe nodule and removal of the pleural based nodule, and possible segmentectomy (if the right lower lobe nodule is cancer). He discussed the general nature of the procedure with the patient and her daughter. He also discussed possible risks, complications, and benefits of the surgery. She was admitted on 01/05/2016 in order to undergo a right VATS, wedge resection with possible segmental resection, and resection of diaphramatic/pleural cyst, and On Q placement.  Brief Hospital Course:  On 01/05/2016 She underwent right VATS, wedge resection of a right lower lobe nodule, right lower lobectomy (segmentectomy not possible for technical reasons), resection of a diaphragmatic cyst and On-Q catheter placement. She remained afebrile and hemodynamically stable. Her a line and foley were removed early in her post operative course. Chest tube output gradually decreased. Daily chest x rays were obtained and remained stable. Chest tubes were placed to water seal on 03/27. One chest tube  was removed on 03/27. Remaining chest tube had an air leak. It was later removed on 03/31. Follow up chest x ray showed worsenedsubcutaneous emphysema. She was observed but did not require another chest tube placement. Her  pneumothorax remained stable and after several days her subcutaneous emphysema started to resolve. She initially required 2 liters of oxygen via [place]. She was weaned to room air. She is ambulating without difficulty. She is tolerating a diet. She is felt surgically stable for discharge today.  Her blood pressure has been low and she has had some brady-dysrhythmias. Her meds were adjusted per cardiology and she had no further bradycardia. Cardiology has scheduled follow-up.    Latest Vital Signs: Blood pressure 82/42, pulse 74, temperature 97.5 F (36.4 C), temperature source Oral, resp. rate 24, height '5\' 1"'$  (1.549 m), weight 149 lb 11.1 oz (67.9 kg), last menstrual period 10/16/1975, SpO2 98 %.  Physical Exam: Cardiovascular: Slightly bradycardic Pulmonary: Clear to auscultation on left and coarse on right Abdomen: Soft, non tender, bowel sounds present. Extremities: SCDs in places Wounds: Dressing is clean and dry.    Discharge Condition: Stable and discharged to home  Recent laboratory studies:  Lab Results  Component Value Date   WBC 4.2 01/12/2016   HGB 11.3* 01/12/2016   HCT 35.1* 01/12/2016   MCV 95.6 01/12/2016   PLT 177 01/12/2016   Lab Results  Component Value Date   NA 133* 01/12/2016   K 3.6 01/12/2016   CL 96* 01/12/2016   CO2 29 01/12/2016   CREATININE 0.70 01/12/2016   GLUCOSE 97 01/12/2016    Diagnostic Studies:  EXAM: PORTABLE CHEST 1 VIEW  COMPARISON: 0648 hours today and earlier.  FINDINGS: Portable AP semi upright view at 1315 hours. Right chest tube an the right IJ central line have been removed. Stable small to moderate residual right pneumothorax since this morning, with pleural edge visible in the apex and at the lung base. Moderate volume of chest wall subcutaneous emphysema is stable.  Slightly improved lung volumes. No superimposed pulmonary edema. No definite pleural effusion. Stable cardiac size and mediastinal contours. Allowing for  portable technique the left lung remains clear.  IMPRESSION: 1. Stable small to moderate residual right pneumothorax following right chest tube removal. Right IJ central line also removed. 2. Otherwise stable.   Electronically Signed  By: Genevie Ann M.D.  On: 01/13/2016 13:28    Discharge Medications:   Medication List    STOP taking these medications        amLODipine 5 MG tablet  Commonly known as:  NORVASC     ciprofloxacin 500 MG tablet  Commonly known as:  CIPRO     telmisartan 80 MG tablet  Commonly known as:  MICARDIS      TAKE these medications        acetaminophen 500 MG tablet  Commonly known as:  TYLENOL  Take 2 tablets (1,000 mg total) by mouth every 6 (six) hours as needed for mild pain.     BENEFIBER DRINK MIX PO  Take 2.5 mg by mouth 2 (two) times daily.     CALTRATE 600+D 600-400 MG-UNIT tablet  Generic drug:  Calcium Carbonate-Vitamin D  Take 1 tablet by mouth daily.     carboxymethylcellulose 0.5 % Soln  Commonly known as:  REFRESH PLUS  Place 1 drop into both eyes 2 (two) times daily as needed (for dry eyes).     carvedilol 6.25 MG tablet  Commonly known as:  COREG  Take  1 tablet (6.25 mg total) by mouth 2 (two) times daily with a meal.     Estradiol 10 MCG Tabs vaginal tablet  Commonly known as:  VAGIFEM  Place 1 tablet (10 mcg total) vaginally 2 (two) times a week.     Fish Oil 1000 MG Caps  Take 1 capsule by mouth 2 (two) times daily.     multivitamin with minerals Tabs tablet  Take 1 tablet by mouth every evening.     OVER THE COUNTER MEDICATION  Macular degeneration supplement.  2 times daily.  Receives from Dr. Payton Emerald office     oxyCODONE 5 MG immediate release tablet  Commonly known as:  Oxy IR/ROXICODONE  Take 1 tablet (5 mg total) by mouth every 6 (six) hours as needed for severe pain.     Vitamin D2 400 units Tabs  Take 400 Units by mouth daily.     warfarin 4 MG tablet  Commonly known as:  COUMADIN  Take 4-6 mg  by mouth every evening. Take '6mg'$  Monday, Wednesday, and Friday. Take '4mg'$  all the other days of the week.        Follow Up Appointments: Follow-up Information    Follow up with Melrose Nakayama, MD On 01/24/2016.   Specialty:  Cardiothoracic Surgery   Why:  PA/LAT CXR to be taken (at [facility] which is in the same building as Dr. Leonarda Salon office) on 01/24/2016 at 11:00 am;Appointment time is at 11:45 am   Contact information:   [place] [address] [phone]       Follow up with [facility].   Why:  [facility]   Contact information:   [place] [address] [phone]       Follow up with Erlene Quan, PA-C On 01/26/2016.   Specialties:  Cardiology, Radiology   Why:  Cardiology Hospital Follow-Up on 01/26/2016 at 2:00PM.   Contact information:   [place] [address] [phone]       Follow up with [facility].   Why:  Please draw PT and INR (as is on Coumadin)  on Thursday 01/19/2016 and call results to Dr. Brigitte Pulse       Signed: Lars Pinks MPA-C 01/18/2016, 9:25 AM

## 2016-01-06 NOTE — Progress Notes (Signed)
Physician notified: Roxan Hockey At: 0825  Regarding: Pt having movement of soft tissue with breathing on R anterior chest wall, crepitus noted. Placed back to ~20cm suction, large amount air bubbles immediately when placed to suction. VSS.  Awaiting return response.   Returned Response at: 0828  Order(s): Keep on suction.

## 2016-01-06 NOTE — Progress Notes (Addendum)
      [address]       [place] [address]             [phone]       2 Day Post-Op Procedure(s) (LRB): RIGHT VIDEO ASSISTED THORACOSCOPY (VATS)/WEDGE RESECTION (Right) RESECTION OF PLEURAL MASS, right (N/A) RIGHT LOWER LOBE LOBECTOMY (Right)  Subjective: Patient has no specific complaints   Objective: Vital signs in last 24 hours: Temp:  [97.2 F (36.2 C)-98.2 F (36.8 C)] 98.2 F (36.8 C) (03/24 0344) Pulse Rate:  [56-74] 59 (03/24 0400) Cardiac Rhythm:  [-] Atrial fibrillation (03/24 0759) Resp:  [11-26] 19 (03/24 0800) BP: (102-122)/(58-83) 107/58 mmHg (03/24 0344) SpO2:  [97 %-99 %] 99 % (03/24 0800) Arterial Line BP: (109-132)/(48-70) 120/51 mmHg (03/24 0400) Weight:  [149 lb 11.1 oz (67.9 kg)] 149 lb 11.1 oz (67.9 kg) (03/23 1605)     Intake/Output from previous day: 03/23 0701 - 03/24 0700 In: 3015 [P.O.:360; I.V.:2605; IV Piggyback:50] Out: 2700 [Urine:1630; Blood:400; Chest Tube:670]   Physical Exam:  Cardiovascular: [facility] Pulmonary: Clear to auscultation on left and coarse on right Abdomen: Soft, non tender, bowel sounds present. Extremities: SCDs in places Wounds: Dressing is clean and dry.   Chest Tubes: to suction, +2 air leak with cough  Lab Results: CBC: Recent Labs  01/03/16 1044 01/06/16 0420  WBC 4.1 8.9  HGB 12.5 10.5*  HCT 39.2 30.8*  PLT 123* 123*   BMET:  Recent Labs  01/03/16 1044 01/06/16 0420  NA 136 135  K 4.3 4.4  CL 102 105  CO2 22 23  GLUCOSE 100* 118*  BUN 10 10  CREATININE 0.68 0.70  CALCIUM 10.2 7.8*    PT/INR:  Recent Labs  01/05/16 0758  LABPROT 16.5*  INR 1.32   ABG:  INR: Will add last result for INR, ABG once components are confirmed Will add last 4 CBG results once components are confirmed  Assessment/Plan:  1. CV - History of a fib and atrial tachycardic. A fib in the 60's. On Coreg 12.5 mg bid, Avapro 150 mg daily, and Coumadin. INR slightly increased to 1.53. 2.   Pulmonary - On 2 liters of oxygen via [place]. Wean to room air as tolerates.Chest tube is to suction and the output is 150 cc last 12 hours. There is an air leak with cough.  CXR remains stable. Hope to place to water seal in am. Encourage incentive spirometer. 3. Anemia-H and H 11.2 and 34.4 4. Platelet count 115,000 5. Remove On Q in am  Sid Greener MPA-C 01/06/2016,8:51 AM

## 2016-01-06 NOTE — Op Note (Signed)
NAMEBICH, MCHANEY NO.:  [id number]  MEDICAL RECORD NO.:  [id number]  LOCATION:  3S12C                        FACILITY:  [facility]  PHYSICIAN:  Revonda Standard. Roxan Hockey, M.D.DATE OF BIRTH:  06-01-1934  DATE OF PROCEDURE:  01/05/2016 DATE OF DISCHARGE:                              OPERATIVE REPORT   PREOPERATIVE DIAGNOSIS:  Right lower lobe lung nodule and pleural nodule.  POSTOPERATIVE DIAGNOSES:  Carcinoma right lower lobe, clinical stage IA and pleural cyst.  PROCEDURE:   Right video-assisted thoracoscopy.   Resection of diaphragmatic/pleural cyst. Wedge resection, right lower lobe. Thoracoscopic right lower lobectomy.   Insertion of On-Q local anestheticcatheter.  SURGEON:  Revonda Standard. Roxan Hockey, M.D.  ASSISTANT:  Katheran Awe, PA.  ANESTHESIA:  General.  FINDINGS:  Approximately 2 cm diameter fluid-filled cyst arising from the central portion of the diaphragm. Benign in appearance.  Nodule in posteromedial portion of right lower lobe.  Wedge resection revealed carcinoma.  Technical difficulties with attempted segmentectomy due to fragility of lung, therefore converted to lobectomy. paucity of lymph nodes.  CLINICAL NOTE:  [name] is an 82 year old woman with a remote history of breast cancer.  She is a lifelong nonsmoker.  She recently had a CT of the abdomen and pelvis and she was noted to have 2 right lung nodules.  A PET-CT showed metabolic activity in an 82-YK right lower lobe nodule.  Although the size of nodule was unchanged, the metabolic activity was new.  The separate nodule which appeared to have been potentially pleural-based had doubled in size but metabolic activity was indeterminate.  She was advised to undergo right VATS for wedge resection, possible segmentectomy.  The indications, risks, benefits, and alternatives were discussed in detail with the patient.  She understood and accepted the risks and agreed to proceed.  OPERATIVE  NOTE:  [name] was brought to the preoperative holding area on January 05, 2016.  Anesthesia placed a central line and an arterial blood pressure monitoring line.  She was taken to the operating room, anesthetized and intubated with a double-lumen endotracheal tube. Sequential compression devices were placed on the calves for DVT prophylaxis.  Intravenous antibiotics were administered.  A Foley catheter was placed.  She was placed in a left lateral decubitus position, and the right chest was prepped and draped in usual sterile fashion.  Single lung ventilation of the right lung was initiated and was tolerated well throughout the procedure.  An incision was made in the seventh intercostal space in the midaxillary line.  A 5- mm port was inserted, and the thoracoscope was advanced in the chest. There was good isolation of the right lung.  A working incision, approximately 5 cm in length, was made in the fifth interspace anterolaterally.  No rib spreading was performed during the procedure. The lung was retracted upward and the diaphragm was inspected.  There was a 2-cm thin-walled cystic mass on the diaphragm.  This was removed with electrocautery.  The cyst was disrupted during the removal.  It was sent for permanent pathology.  The inferior ligament was divided up to the level of the inferior pulmonary vein with electrocautery.  In the posterior medial aspect  of the right lower lobe, there was a palpable nodule consistent with the nodule on CT scan.  A wedge resection was performed with sequential firings of an Ethicon Echelon 45 mm stapler with gold cartridges.  The specimen was placed into an endoscopic retrieval bag, removed and sent for frozen section.  While awaiting the results of the frozen section, the pleural reflection was divided at the hilum anteriorly and posteriorly.  It was noted that there was bleeding in the lung behind the staple lines where the wedge resection had  been performed.  The fissures were noted to be relatively incomplete.  The frozen section returned showing carcinoma that was close to or involving the margin.  Decision was made to proceed with the basilar segmentectomy initially.  The dissection was begun in the major fissure between the lower and middle lobes.  This fissure became very indistinct the deeper the dissection was carried.  The anterior portion of the fissure was divided with a stapler.  Ultimately, the pulmonary artery was identified.  The fissure was completed over the pulmonary artery with sequential firings of the endoscopic stapler.  The basilar segmental branches of the inferior pulmonary vein were dissected out. Of note, there were no lymph nodes noted around the vessels during the dissection of the vessels or the bronchus for that matter.  The basilar segmental vein branches were isolated, encircled, and divided with the endoscopic vascular stapler.  Attention then was turned back to the fissure, and the basilar segmental pulmonary arterial branches were dissected out.  There was a small pulmonary artery branch approximately a millimeter in size that avulsed.  Pressure was held on this.  The incision was extended posteriorly to allow better exposure and easier access to the chest.  The basilar segmental pulmonary artery was encircled and divided and again, no lymph node was noted at the bifurcation of the superior segmental branch.  At this point, an attempt was made to begin the parenchymal division of the basilar segments. When the stapler was placed on the lung, the anvil of the stapler created tear from which there was bleeding.  Closure of the stapler and firing it created more bleeding in the lung behind the staple line.  It was evident that a basilar segmentectomy would require a full open procedure to use conventional staplers and even then might not be successful, so the decision was made to proceed with a  lobectomy.  The superior segmental arterial branch was dissected out and divided with the endoscopic vascular stapler.  The major fissure was completed posteriorly between the superior segment and the upper lobe with sequential firings of the endoscopic stapler.  The lower lobe bronchus was encircled with a stapler with a green cartridge placed across this bronchus and closed.  A test inflation showed good aeration of the upper and middle lobes.  The stapler was fired transecting the lower lobe bronchus.  The lower lobe was placed into an endoscopic retrieval bag and removed through the incision.  It was sent for frozen section of the bronchial margin which subsequently returned negative for tumor.  Again, as noted earlier, no identifiable lymph nodes were encountered during the dissection of the vessels or the bronchus. Given the absence of suspicious findings on the CT and the PET, the decision was made not to perform a lymph node dissection.  The chest was copiously irrigated with warm saline.  A test inflation to 30 cm of water revealed no leakage from the bronchial stump.  There  was a small amount of air leakage along the staple line on the upper lobe.  Progel was applied to this area.  An On-Q local anesthetic catheter was tunneled into a subpleural location and primed with 5 mL of 0.5% Marcaine.  It was secured to the skin with a 3-0 silk suture.  A 28-French chest tube was placed through a separate stab incision and directed anteriorly.  A 28- [place] Blake drain was placed through the original port incision and directed posteriorly.  They were secured to the skin with #1 silk sutures.  The upper and middle lobes were reinflated.  The working incision was closed with a #1 Vicryl fascial suture, followed by 2-0 Vicryl subcutaneous suture, and a 3-0 Vicryl subcuticular suture. All sponge, needle, and instrument counts were correct at the end of the procedure.  The patient was taken  from the operating room to the postoperative care unit extubated and in good condition.     Revonda Standard Roxan Hockey, M.D.     SCH/MEDQ  D:  01/06/2016  T:  01/06/2016  Job:  [id number]

## 2016-01-07 ENCOUNTER — Inpatient Hospital Stay (HOSPITAL_COMMUNITY): Payer: Medicare Other

## 2016-01-07 LAB — PROTIME-INR
INR: 1.53 — ABNORMAL HIGH (ref 0.00–1.49)
Prothrombin Time: 18.5 seconds — ABNORMAL HIGH (ref 11.6–15.2)

## 2016-01-07 LAB — CBC
HCT: 34.4 % — ABNORMAL LOW (ref 36.0–46.0)
HEMOGLOBIN: 11.2 g/dL — AB (ref 12.0–15.0)
MCH: 31.5 pg (ref 26.0–34.0)
MCHC: 32.6 g/dL (ref 30.0–36.0)
MCV: 96.9 fL (ref 78.0–100.0)
Platelets: 115 10*3/uL — ABNORMAL LOW (ref 150–400)
RBC: 3.55 MIL/uL — AB (ref 3.87–5.11)
RDW: 14.4 % (ref 11.5–15.5)
WBC: 9.2 10*3/uL (ref 4.0–10.5)

## 2016-01-07 LAB — COMPREHENSIVE METABOLIC PANEL
ALK PHOS: 40 U/L (ref 38–126)
ALT: 20 U/L (ref 14–54)
AST: 27 U/L (ref 15–41)
Albumin: 3 g/dL — ABNORMAL LOW (ref 3.5–5.0)
Anion gap: 7 (ref 5–15)
BUN: 10 mg/dL (ref 6–20)
CHLORIDE: 104 mmol/L (ref 101–111)
CO2: 24 mmol/L (ref 22–32)
CREATININE: 0.78 mg/dL (ref 0.44–1.00)
Calcium: 8.1 mg/dL — ABNORMAL LOW (ref 8.9–10.3)
GFR calc Af Amer: >60 mL/min (ref >60)
GLUCOSE: 103 mg/dL — AB (ref 65–99)
Potassium: 4.3 mmol/L (ref 3.5–5.1)
SODIUM: 135 mmol/L (ref 135–145)
Total Bilirubin: 1.4 mg/dL — ABNORMAL HIGH (ref 0.3–1.2)
Total Protein: 4.9 g/dL — ABNORMAL LOW (ref 6.5–8.1)

## 2016-01-07 NOTE — Progress Notes (Addendum)
2 Days Post-Op Procedure(s) (LRB): RIGHT VIDEO ASSISTED THORACOSCOPY (VATS)/WEDGE RESECTION (Right) RESECTION OF PLEURAL MASS, right (N/A) RIGHT LOWER LOBE LOBECTOMY (Right) Subjective:  Feels ok. Some pain  Objective: Vital signs in last 24 hours: Temp:  [97.6 F (36.4 C)-99 F (37.2 C)] 98 F (36.7 C) (03/25 1446) Pulse Rate:  [63-88] 67 (03/25 1418) Cardiac Rhythm:  [-] Other (Comment) (03/25 1618) Resp:  [16-26] 18 (03/25 1600) BP: (108-144)/(60-95) 120/68 mmHg (03/25 1418) SpO2:  [95 %-97 %] 97 % (03/25 1600)  Hemodynamic parameters for last 24 hours:    Intake/Output from previous day: 03/24 0701 - 03/25 0700 In: 1252.4 [P.O.:600; I.V.:652.4] Out: 1680 [Urine:750; Chest Tube:930] Intake/Output this shift: Total I/O In: 570 [P.O.:480; I.V.:90] Out: 420 [Chest Tube:420]  General appearance: alert and cooperative Heart: regular rate and rhythm, S1, S2 normal, no murmur, click, rub or gallop Lungs: rhonchi right lung no air leak from chest tube.  Lab Results:  Recent Labs  01/06/16 0420 01/07/16 0534  WBC 8.9 9.2  HGB 10.5* 11.2*  HCT 30.8* 34.4*  PLT 123* 115*   BMET:  Recent Labs  01/06/16 0420 01/07/16 0534  NA 135 135  K 4.4 4.3  CL 105 104  CO2 23 24  GLUCOSE 118* 103*  BUN 10 10  CREATININE 0.70 0.78  CALCIUM 7.8* 8.1*    PT/INR:  Recent Labs  01/07/16 0534  LABPROT 18.5*  INR 1.53*   ABG    Component Value Date/Time   PHART 7.360 01/06/2016 0455   HCO3 23.5 01/06/2016 0455   TCO2 24.8 01/06/2016 0455   ACIDBASEDEF 1.2 01/06/2016 0455   O2SAT 97.9 01/06/2016 0455   CBG (last 3)  No results for input(s): GLUCAP in the last 72 hours.  Assessment/Plan: S/P Procedure(s) (LRB): RIGHT VIDEO ASSISTED THORACOSCOPY (VATS)/WEDGE RESECTION (Right) RESECTION OF PLEURAL MASS, right (N/A) RIGHT LOWER LOBE LOBECTOMY (Right)  Stable day. Will place to water seal.  Can probably remove one of tubes tomorrow. Continue IS, ambulation.    LOS: 2 days    Gaye Pollack 01/07/2016

## 2016-01-07 NOTE — Progress Notes (Signed)
[place] container changed.

## 2016-01-08 ENCOUNTER — Inpatient Hospital Stay (HOSPITAL_COMMUNITY): Payer: Medicare Other

## 2016-01-08 LAB — PROTIME-INR
INR: 2.02 — AB (ref 0.00–1.49)
PROTHROMBIN TIME: 22.7 s — AB (ref 11.6–15.2)

## 2016-01-08 MED ORDER — IRBESARTAN 300 MG PO TABS
300.0000 mg | ORAL_TABLET | Freq: Every day | ORAL | Status: DC
  Administered 2016-01-10 – 2016-01-15 (×5): 300 mg via ORAL
  Filled 2016-01-08 (×18): qty 1

## 2016-01-08 NOTE — Progress Notes (Addendum)
      [address]       [place] [address]             [phone]       2 Day Post-Op Procedure(s) (LRB): RIGHT VIDEO ASSISTED THORACOSCOPY (VATS)/WEDGE RESECTION (Right) RESECTION OF PLEURAL MASS, right (N/A) RIGHT LOWER LOBE LOBECTOMY (Right)  Subjective: Patient has no specific complaints   Objective: Vital signs in last 24 hours: Temp:  [97.6 F (36.4 C)-98.6 F (37 C)] 98.5 F (36.9 C) (03/26 0812) Pulse Rate:  [67-83] 83 (03/26 0733) Cardiac Rhythm:  [-] Atrial fibrillation (03/26 0733) Resp:  [15-20] 18 (03/26 0733) BP: (115-162)/(68-95) 133/76 mmHg (03/26 0733) SpO2:  [94 %-98 %] 98 % (03/26 0733)     Intake/Output from previous day: 03/25 0701 - 03/26 0700 In: 960.5 [P.O.:720; I.V.:240.5] Out: 560 [Chest Tube:560]   Physical Exam:  Cardiovascular: IRRR IRRR Pulmonary: Clear to auscultation on left and coarse on right. One of the chest tube sutures is slightly loose and there is sero sanguinous drainage. Abdomen: Soft, non tender, bowel sounds present. Extremities: SCDs in places Wounds: Dressing is clean and dry.   Chest Tubes: to water seal , +++ air leak with cough  Lab Results: CBC:  Recent Labs  01/06/16 0420 01/07/16 0534  WBC 8.9 9.2  HGB 10.5* 11.2*  HCT 30.8* 34.4*  PLT 123* 115*   BMET:   Recent Labs  01/06/16 0420 01/07/16 0534  NA 135 135  K 4.4 4.3  CL 105 104  CO2 23 24  GLUCOSE 118* 103*  BUN 10 10  CREATININE 0.70 0.78  CALCIUM 7.8* 8.1*    PT/INR:   Recent Labs  01/08/16 0454  LABPROT 22.7*  INR 2.02*   ABG:  INR: Will add last result for INR, ABG once components are confirmed Will add last 4 CBG results once components are confirmed  Assessment/Plan:  1. CV - History of a fib and atrial tachycardic. A fib in the 60's. On Coreg 12.5 mg bid, Avapro 150 mg daily, and Coumadin. Will increase Avapro to pre op dose for better BP control.INR increased from to 1.53 to 2.02. On pre op doses of  Coumadin. May need to decrease Coumadin if INR increases too much in am. Monitor. 2.  Pulmonary - On room air. Chest tubes are to water seal and the output is 520 cc last 24 hours. There is a large air leak with cough. CXR shows sight increase in right apical ptx and subcutaneous emphysema on right lateral chest.  Encourage incentive spirometer. Continue chest tubes for now. Check CXR in am 3. Anemia-H and H 11.2 and 34.4 4. Last latelet count 115,000  ZIMMERMAN,DONIELLE MPA-C 01/08/2016,10:38 AM    Chart reviewed, patient examined, agree with above. I did not see an air leak last pm but there is a leak with coughing this am. There is a small ptx or space on CXR that looks unchanged to me. Continue to water seal.

## 2016-01-09 ENCOUNTER — Inpatient Hospital Stay (HOSPITAL_COMMUNITY): Payer: Medicare Other

## 2016-01-09 LAB — PROTIME-INR
INR: 2.03 — ABNORMAL HIGH (ref 0.00–1.49)
Prothrombin Time: 22.8 seconds — ABNORMAL HIGH (ref 11.6–15.2)

## 2016-01-09 NOTE — Progress Notes (Signed)
Patient walked a lap around the unit.

## 2016-01-09 NOTE — Progress Notes (Signed)
4 Days Post-Op Procedure(s) (LRB): RIGHT VIDEO ASSISTED THORACOSCOPY (VATS)/WEDGE RESECTION (Right) RESECTION OF PLEURAL MASS, right (N/A) RIGHT LOWER LOBE LOBECTOMY (Right) Subjective: Didn't sleep well C/o palpitations, pain  Objective: Vital signs in last 24 hours: Temp:  [97.5 F (36.4 C)-98.5 F (36.9 C)] 98.3 F (36.8 C) (03/27 0253) Pulse Rate:  [71-78] 78 (03/27 0315) Cardiac Rhythm:  [-] Atrial fibrillation (03/26 2027) Resp:  [17-24] 21 (03/27 0315) BP: (126-154)/(65-82) 131/75 mmHg (03/27 0315) SpO2:  [96 %-100 %] 96 % (03/27 0315)  Hemodynamic parameters for last 24 hours:    Intake/Output from previous day: 03/26 0701 - 03/27 0700 In: 1100 [P.O.:960; I.V.:140] Out: 240 [Chest Tube:240] Intake/Output this shift:    General appearance: alert, cooperative and no distress Neurologic: intact Heart: regular rate and rhythm Lungs: diminished breath sounds on right small air leak  Lab Results:  Recent Labs  01/07/16 0534  WBC 9.2  HGB 11.2*  HCT 34.4*  PLT 115*   BMET:  Recent Labs  01/07/16 0534  NA 135  K 4.3  CL 104  CO2 24  GLUCOSE 103*  BUN 10  CREATININE 0.78  CALCIUM 8.1*    PT/INR:  Recent Labs  01/09/16 0343  LABPROT 22.8*  INR 2.03*   ABG    Component Value Date/Time   PHART 7.360 01/06/2016 0455   HCO3 23.5 01/06/2016 0455   TCO2 24.8 01/06/2016 0455   ACIDBASEDEF 1.2 01/06/2016 0455   O2SAT 97.9 01/06/2016 0455   CBG (last 3)  No results for input(s): GLUCAP in the last 72 hours.  Assessment/Plan: S/P Procedure(s) (LRB): RIGHT VIDEO ASSISTED THORACOSCOPY (VATS)/WEDGE RESECTION (Right) RESECTION OF PLEURAL MASS, right (N/A) RIGHT LOWER LOBE LOBECTOMY (Right) -  CV- rate controlled a fib  INR > 2.0- dc enoxaparin, continue coumadin  RESP- dc posterior CT, anterior CT to water seal  RENAL- creatinine and lytes OK  ENDO- glucose OK  DVT prophylaxis- on coumadin  Continue ambulation   LOS: 4 days    [name] 01/09/2016

## 2016-01-09 NOTE — Progress Notes (Signed)
Patient complaining of frequent, foul smelling stools along with general stomach discomfort. Order placed for C. Diff specimen. RN will continue to monitor.

## 2016-01-09 NOTE — Care Management Important Message (Signed)
Important Message  Patient Details  Name: [name] MRN: [id number] Date of Birth: 22-Feb-1934   Medicare Important Message Given:  Yes    Loann Quill 01/09/2016, 12:54 PM

## 2016-01-10 ENCOUNTER — Inpatient Hospital Stay (HOSPITAL_COMMUNITY): Payer: Medicare Other

## 2016-01-10 LAB — PROTIME-INR
INR: 2.37 — ABNORMAL HIGH (ref 0.00–1.49)
Prothrombin Time: 25.7 seconds — ABNORMAL HIGH (ref 11.6–15.2)

## 2016-01-10 MED ORDER — COUMADIN BOOK
Freq: Once | Status: AC
  Administered 2016-01-10: 13:00:00
  Filled 2016-01-10: qty 1

## 2016-01-10 MED ORDER — WARFARIN VIDEO: Freq: Once | Status: DC

## 2016-01-10 MED ORDER — DOCUSATE SODIUM 100 MG PO CAPS
100.0000 mg | ORAL_CAPSULE | Freq: Two times a day (BID) | ORAL | Status: DC | PRN
Start: 2016-01-10 — End: 2016-01-17
  Administered 2016-01-10 – 2016-01-13 (×2): 100 mg via ORAL
  Filled 2016-01-10 (×2): qty 1

## 2016-01-10 NOTE — Progress Notes (Addendum)
5 Days Post-Op Procedure(s) (LRB): RIGHT VIDEO ASSISTED THORACOSCOPY (VATS)/WEDGE RESECTION (Right) RESECTION OF PLEURAL MASS, right (N/A) RIGHT LOWER LOBE LOBECTOMY (Right) Subjective: C/o bruising from IV infiltrating left arm  Objective: Vital signs in last 24 hours: Temp:  [97.8 F (36.6 C)-99.4 F (37.4 C)] 98.2 F (36.8 C) (03/28 0254) Pulse Rate:  [68-110] 110 (03/28 0305) Cardiac Rhythm:  [-] Atrial fibrillation (03/28 0720) Resp:  [15-19] 18 (03/28 0305) BP: (114-162)/(74-85) 162/82 mmHg (03/28 0305) SpO2:  [95 %-98 %] 95 % (03/28 0305)  Hemodynamic parameters for last 24 hours:    Intake/Output from previous day: 03/27 0701 - 03/28 0700 In: 720 [P.O.:720] Out: 300 [Chest Tube:300] Intake/Output this shift:    General appearance: alert, cooperative and no distress Neurologic: intact Heart: irregularly irregular rhythm Lungs: diminished breath sounds RLL and right base Wound: clean and dry smal intermittent air leak, sub q emphysema unchanged  Lab Results: No results for input(s): WBC, HGB, HCT, PLT in the last 72 hours. BMET: No results for input(s): NA, K, CL, CO2, GLUCOSE, BUN, CREATININE, CALCIUM in the last 72 hours.  PT/INR:  Recent Labs  01/10/16 0355  LABPROT 25.7*  INR 2.37*   ABG    Component Value Date/Time   PHART 7.360 01/06/2016 0455   HCO3 23.5 01/06/2016 0455   TCO2 24.8 01/06/2016 0455   ACIDBASEDEF 1.2 01/06/2016 0455   O2SAT 97.9 01/06/2016 0455   CBG (last 3)  No results for input(s): GLUCAP in the last 72 hours.  Assessment/Plan: S/P Procedure(s) (LRB): RIGHT VIDEO ASSISTED THORACOSCOPY (VATS)/WEDGE RESECTION (Right) RESECTION OF PLEURAL MASS, right (N/A) RIGHT LOWER LOBE LOBECTOMY (Right) -  CV- a fib with controlled rate  INR= 2.37 coninue coumadin  RESP- air leak improved but not completely resolved, continue Ct  RENAL- no issues  Poor peripheral access- keep central line for now  ambulating   LOS: 5 days     Melrose Nakayama 01/10/2016  PATH- carcinoid tumor, discussed with patient and daughter last night

## 2016-01-10 NOTE — Care Management Note (Signed)
Case Management Note  Patient Details  Name: [name] MRN: [id number] Date of Birth: July 06, 1934  Subjective/Objective:   Patient is from home alone, has very supportive children, s/p VATS Wedge Resection, has chest tube x 1, with positive air leak , will cont chest tube for now.   NCM will continue to follow for dc needs.                Action/Plan:   Expected Discharge Date:                  Expected Discharge Plan:  Home/Self Care  In-House Referral:     Discharge planning Services  CM Consult  Post Acute Care Choice:    Choice offered to:     DME Arranged:    DME Agency:     HH Arranged:    HH Agency:     Status of Service:  In process, will continue to follow  Medicare Important Message Given:  Yes Date Medicare IM Given:    Medicare IM give by:    Date Additional Medicare IM Given:    Additional Medicare Important Message give by:     If discussed at [place] of Stay Meetings, dates discussed:    Additional Comments:  Zenon Mayo, RN 01/10/2016, 5:12 PM

## 2016-01-11 ENCOUNTER — Inpatient Hospital Stay (HOSPITAL_COMMUNITY): Payer: Medicare Other

## 2016-01-11 LAB — PROTIME-INR
INR: 2.49 — ABNORMAL HIGH (ref 0.00–1.49)
PROTHROMBIN TIME: 26.6 s — AB (ref 11.6–15.2)

## 2016-01-11 NOTE — Care Management Note (Signed)
Case Management Note  Patient Details  Name: KENLYNN HOUDE MRN: [id number] Date of Birth: 1934-07-04  Subjective/Objective:   Patient will have her daughter and son with her at discharge, she conts with chest tube to water seal, large air leak, cxr with ptx and subcutaneous emphysema and small pl effusion.   NCM will cont to follow for dc needs.              Action/Plan:   Expected Discharge Date:                  Expected Discharge Plan:  Home/Self Care  In-House Referral:     Discharge planning Services  CM Consult  Post Acute Care Choice:    Choice offered to:     DME Arranged:    DME Agency:     HH Arranged:    HH Agency:     Status of Service:  In process, will continue to follow  Medicare Important Message Given:  Yes Date Medicare IM Given:    Medicare IM give by:    Date Additional Medicare IM Given:    Additional Medicare Important Message give by:     If discussed at [place] of Stay Meetings, dates discussed:    Additional Comments:  Zenon Mayo, RN 01/11/2016, 3:35 PM

## 2016-01-11 NOTE — Discharge Instructions (Signed)
Video Assisted Thoracoscopic Surgery, Care After Refer to this sheet in the next few weeks. These instructions provide you with information on caring for yourself after your procedure. Your caregiver may also give you more specific instructions. Your procedure has been planned according to current medical practices, but problems sometimes occur. Call your caregiver if you have any problems or questions after your procedure. HOME CARE INSTRUCTIONS   Only take over-the-counter or prescription medications as directed.  Only take pain medications (narcotics) as directed.  Do not drive until your caregiver approves. Driving while taking narcotics or soon after surgery can be dangerous, so discuss the specific timing with your caregiver.  Avoid activities that use your chest muscles, such as lifting heavy objects, for at least 3-4 weeks.   Take deep breaths to expand the lungs and to protect against pneumonia.  Do breathing exercises as directed by your caregiver. If you were given an incentive spirometer to help with breathing, use it as directed.  You may resume a normal diet and activities when you feel you are able to or as directed.  Do not take a bath until your caregiver says it is OK. Use the shower instead.   Keep the bandage (dressing) covering the area where the chest tube was inserted (incision site) dry for 48 hours. After 48 hours, remove the dressing unless there is new drainage.  Remove dressings as directed by your caregiver.  Change dressings if necessary or as directed.  Keep all follow-up appointments. It is important for you to see your caregiver after surgery to discuss appropriate follow-up care and surveillance, if it is necessary. SEEK MEDICAL CARE:  You feel excessive or increasing pain at an incision site.  You notice bleeding, skin irritation, drainage, swelling, or redness at an incision site.  There is a bad smell coming from an incision or dressing.  It  feels like your heart is fluttering or beating rapidly.  Your pain medication does not relieve your pain. SEEK IMMEDIATE MEDICAL CARE IF:   You have a fever.   You have chest pain.  You have a rash.  You have shortness of breath.  You have trouble breathing.   You feel weak, lightheaded, dizzy, or faint.  MAKE SURE YOU:   Understand these instructions.   Will watch your condition.   Will get help right away if you are not doing well or get worse.   This information is not intended to replace advice given to you by your health care provider. Make sure you discuss any questions you have with your health care provider.   Document Released: 01/26/2013 Document Revised: 10/22/2014 Document Reviewed: 01/26/2013 Elsevier Interactive Patient Education 2016 [place] on my medicine - Coumadin   (Warfarin)  Why was Coumadin prescribed for you? Coumadin was prescribed for you because you have a blood clot or a medical condition that can cause an increased risk of forming blood clots. Blood clots can cause serious health problems by blocking the flow of blood to the heart, lung, or brain. Coumadin can prevent harmful blood clots from forming. As a reminder your indication for Coumadin is:   Stroke Prevention Because Of Atrial Fibrillation  What test will check on my response to Coumadin? While on Coumadin (warfarin) you will need to have an INR test regularly to ensure that your dose is keeping you in the desired range. The INR (international normalized ratio) number is calculated from the result of the laboratory test called  prothrombin time (PT).  If an INR APPOINTMENT HAS NOT ALREADY BEEN MADE FOR YOU please schedule an appointment to have this lab work done by your health care provider within 7 days. Your INR goal is usually a number between:  2 to 3 or your provider may give you a more narrow range like 2-2.5.  Ask your health care provider during an  office visit what your goal INR is.  What  do you need to  know  About  COUMADIN? Take Coumadin (warfarin) exactly as prescribed by your healthcare provider about the same time each day.  DO NOT stop taking without talking to the doctor who prescribed the medication.  Stopping without other blood clot prevention medication to take the place of Coumadin may increase your risk of developing a new clot or stroke.  Get refills before you run out.  What do you do if you miss a dose? If you miss a dose, take it as soon as you remember on the same day then continue your regularly scheduled regimen the next day.  Do not take two doses of Coumadin at the same time.  Important Safety Information A possible side effect of Coumadin (Warfarin) is an increased risk of bleeding. You should call your healthcare provider right away if you experience any of the following: ? Bleeding from an injury or your nose that does not stop. ? Unusual colored urine (red or dark brown) or unusual colored stools (red or black). ? Unusual bruising for unknown reasons. ? A serious fall or if you hit your head (even if there is no bleeding).  Some foods or medicines interact with Coumadin (warfarin) and might alter your response to warfarin. To help avoid this: ? Eat a balanced diet, maintaining a consistent amount of Vitamin K. ? Notify your provider about major diet changes you plan to make. ? Avoid alcohol or limit your intake to 1 drink for women and 2 drinks for men per day. (1 drink is 5 oz. wine, 12 oz. beer, or 1.5 oz. liquor.)  Make sure that ANY health care provider who prescribes medication for you knows that you are taking Coumadin (warfarin).  Also make sure the healthcare provider who is monitoring your Coumadin knows when you have started a new medication including herbals and non-prescription products.  Coumadin (Warfarin)  Major Drug Interactions  Increased Warfarin Effect Decreased Warfarin Effect  Alcohol  (large quantities) Antibiotics (esp. Septra/Bactrim, Flagyl, Cipro) Amiodarone (Cordarone) Aspirin (ASA) Cimetidine (Tagamet) Megestrol (Megace) NSAIDs (ibuprofen, naproxen, etc.) Piroxicam (Feldene) Propafenone (Rythmol SR) Propranolol (Inderal) Isoniazid (INH) Posaconazole (Noxafil) Barbiturates (Phenobarbital) Carbamazepine (Tegretol) Chlordiazepoxide (Librium) Cholestyramine (Questran) Griseofulvin Oral Contraceptives Rifampin Sucralfate (Carafate) Vitamin K   Coumadin (Warfarin) Major Herbal Interactions  Increased Warfarin Effect Decreased Warfarin Effect  Garlic Ginseng Ginkgo biloba Coenzyme Q10 Green tea St. Johns wort    Coumadin (Warfarin) FOOD Interactions  Eat a consistent number of servings per week of foods HIGH in Vitamin K (1 serving =  cup)  Collards (cooked, or boiled & drained) Kale (cooked, or boiled & drained) Mustard greens (cooked, or boiled & drained) Parsley *serving size only =  cup Spinach (cooked, or boiled & drained) Swiss chard (cooked, or boiled & drained) Turnip greens (cooked, or boiled & drained)  Eat a consistent number of servings per week of foods MEDIUM-HIGH in Vitamin K (1 serving = 1 cup)  Asparagus (cooked, or boiled & drained) Broccoli (cooked, boiled & drained, or raw & chopped) Brussel sprouts (cooked, or  boiled & drained) *serving size only =  cup Lettuce, raw (green leaf, endive, romaine) Spinach, raw Turnip greens, raw & chopped   These websites have more information on Coumadin (warfarin):  FailFactory.se; VeganReport.com.au;

## 2016-01-11 NOTE — Progress Notes (Signed)
      [address]       [place], [address]             [phone]       2 Day Post-Op Procedure(s) (LRB): RIGHT VIDEO ASSISTED THORACOSCOPY (VATS)/WEDGE RESECTION (Right) RESECTION OF PLEURAL MASS, right (N/A) RIGHT LOWER LOBE LOBECTOMY (Right)  Subjective: Patient has no specific complaints   Objective: Vital signs in last 24 hours: Temp:  [97.2 F (36.2 C)-98.9 F (37.2 C)] 97.2 F (36.2 C) (03/29 0813) Pulse Rate:  [64-82] 82 (03/29 0343) Cardiac Rhythm:  [-] Atrial fibrillation (03/29 0343) Resp:  [13-18] 13 (03/29 0343) BP: (110-135)/(56-74) 132/70 mmHg (03/29 0343) SpO2:  [96 %-98 %] 96 % (03/29 0343)     Intake/Output from previous day: 03/28 0701 - 03/29 0700 In: 600 [P.O.:600] Out: 30 [Chest Tube:30]   Physical Exam:  Cardiovascular: IRRR IRRR Pulmonary: Clear to auscultation on left and coarse on right. One of the chest tube sutures is slightly loose and there is sero sanguinous drainage. Abdomen: Soft, non tender, bowel sounds present. Extremities: SCDs in places Wounds: Dressing is clean and dry.   Chest Tubes: to water seal , +++ air leak with cough  Lab Results: CBC: No results for input(s): WBC, HGB, HCT, PLT in the last 72 hours. BMET:  No results for input(s): NA, K, CL, CO2, GLUCOSE, BUN, CREATININE, CALCIUM in the last 72 hours.  PT/INR:   Recent Labs  01/11/16 0340  LABPROT 26.6*  INR 2.49*   ABG:  INR: Will add last result for INR, ABG once components are confirmed Will add last 4 CBG results once components are confirmed  Assessment/Plan:  1. CV - History of a fib and atrial tachycardic. A fib in the 60's. On Coreg 12.5 mg bid, Avapro 300 mg daily, and Coumadin.INR increased from to 2.37 to 2.49. On pre op doses of Coumadin.  2.  Pulmonary - On room air. Chest tube is to water seal and the output is 30 cc  ast 12 hours. There is a large air leak with cough. CXR showsright apical ptx and subcutaneous emphysema  on right lateral chest and right base atelectasis and small effusion.  Encourage incentive spirometer. Continue chest tube for now. Check CXR in am 3. Anemia-H and H 11.2 and 34.4 4. Last latelet count 115,000  Lamarius Dirr MPA-C 01/11/2016,8:50 AM

## 2016-01-12 ENCOUNTER — Inpatient Hospital Stay (HOSPITAL_COMMUNITY): Payer: Medicare Other

## 2016-01-12 LAB — CBC
HCT: 35.1 % — ABNORMAL LOW (ref 36.0–46.0)
HEMOGLOBIN: 11.3 g/dL — AB (ref 12.0–15.0)
MCH: 30.8 pg (ref 26.0–34.0)
MCHC: 32.2 g/dL (ref 30.0–36.0)
MCV: 95.6 fL (ref 78.0–100.0)
Platelets: 177 10*3/uL (ref 150–400)
RBC: 3.67 MIL/uL — ABNORMAL LOW (ref 3.87–5.11)
RDW: 14.9 % (ref 11.5–15.5)
WBC: 4.2 10*3/uL (ref 4.0–10.5)

## 2016-01-12 LAB — BASIC METABOLIC PANEL
Anion gap: 8 (ref 5–15)
BUN: 8 mg/dL (ref 6–20)
CALCIUM: 8.5 mg/dL — AB (ref 8.9–10.3)
CO2: 29 mmol/L (ref 22–32)
CREATININE: 0.7 mg/dL (ref 0.44–1.00)
Chloride: 96 mmol/L — ABNORMAL LOW (ref 101–111)
GFR calc non Af Amer: >60 mL/min (ref >60)
Glucose, Bld: 97 mg/dL (ref 65–99)
Potassium: 3.6 mmol/L (ref 3.5–5.1)
SODIUM: 133 mmol/L — AB (ref 135–145)

## 2016-01-12 LAB — PROTIME-INR
INR: 2.57 — AB (ref 0.00–1.49)
PROTHROMBIN TIME: 27.3 s — AB (ref 11.6–15.2)

## 2016-01-12 MED ORDER — SODIUM CHLORIDE 0.9% FLUSH: 10.0000 mL | INTRAVENOUS | Status: DC | PRN

## 2016-01-12 MED ORDER — VITAMIN D 1000 UNITS PO TABS
500.0000 [IU] | ORAL_TABLET | Freq: Every day | ORAL | Status: DC
  Administered 2016-01-12 – 2016-01-17 (×6): 500 [IU] via ORAL
  Filled 2016-01-12 (×6): qty 1

## 2016-01-12 MED ORDER — SODIUM CHLORIDE 0.9% FLUSH
10.0000 mL | Freq: Two times a day (BID) | INTRAVENOUS | Status: DC
  Administered 2016-01-12 (×2): 20 mL
  Administered 2016-01-14 – 2016-01-17 (×4): 10 mL

## 2016-01-12 NOTE — Care Management Important Message (Signed)
Important Message  Patient Details  Name: [name] MRN: [id number] Date of Birth: 31-Jan-1934   Medicare Important Message Given:  Yes    Nathen May 01/12/2016, 11:28 AM

## 2016-01-12 NOTE — Progress Notes (Signed)
Patient with incontinent BM episode bed pad and gown changed peri care done.

## 2016-01-12 NOTE — Progress Notes (Signed)
Labs drawn per MD order per hospital policy

## 2016-01-12 NOTE — Progress Notes (Addendum)
      [address]       [place] [address]             [phone]        RIGHT VIDEO ASSISTED THORACOSCOPY (VATS)/WEDGE RESECTION (Right) RESECTION OF PLEURAL MASS, right (N/A) RIGHT LOWER LOBE LOBECTOMY (Right)  Subjective: Patient requesting ted hose for legs  Objective: Vital signs in last 24 hours: Temp:  [97.8 F (36.6 C)-98.6 F (37 C)] 98 F (36.7 C) (03/30 0806) Pulse Rate:  [71-89] 76 (03/30 0806) Cardiac Rhythm:  [-] Atrial fibrillation (03/29 2027) Resp:  [13-20] 17 (03/30 0806) BP: (101-142)/(50-83) 128/73 mmHg (03/30 0806) SpO2:  [93 %-97 %] 97 % (03/30 0806)     Intake/Output from previous day: 03/29 0701 - 03/30 0700 In: 120 [P.O.:120] Out: 650 [Urine:450; Chest Tube:200]   Physical Exam:  Cardiovascular: IRRR IRRR Pulmonary: Clear to auscultation on left and coarse on right. Subcutaneous emphysema right chest wall. Abdomen: Soft, non tender, bowel sounds present. Extremities: Minor LE edema Wounds: Dressing is clean and dry.   Chest Tubes: to water seal   Lab Results: CBC:  Recent Labs  01/12/16 0518  WBC 4.2  HGB 11.3*  HCT 35.1*  PLT 177   BMET:   Recent Labs  01/12/16 0518  NA 133*  K 3.6  CL 96*  CO2 29  GLUCOSE 97  BUN 8  CREATININE 0.70  CALCIUM 8.5*    PT/INR:   Recent Labs  01/12/16 0518  LABPROT 27.3*  INR 2.57*   ABG:  INR: Will add last result for INR, ABG once components are confirmed Will add last 4 CBG results once components are confirmed  Assessment/Plan:  1. CV - History of a fib and atrial tachycardic. A fib in the 60's. On Coreg 12.5 mg bid, Avapro 300 mg daily, and Coumadin.INR increased from to 2.49 to 2.57. 2.  Pulmonary - On room air. Chest tube is to water seal and the output is 200 cc last 24 hours. It is questionable as to whether or not there is a true air leak present. CXR shows stable right ptx and subcutaneous emphysema on right lateral chest and left lung is clear.   Encourage incentive spirometer. Continue chest tube for now. Check CXR in am 3. Anemia-H and H 11.3 and 35.1 4. Thrombocytopenia resolved as platelets up to 177,000 5. Ted hose  ZIMMERMAN,DONIELLE MPA-C 01/12/2016,8:22 AM  Patient seen and examined, agree with above I think there is a very small air leak, but there is a great deal of tidal movement. Will leave tube in one more day to be safe  Remo Lipps C. Roxan Hockey, MD [facility] [phone]

## 2016-01-13 ENCOUNTER — Inpatient Hospital Stay (HOSPITAL_COMMUNITY): Payer: Medicare Other

## 2016-01-13 LAB — PROTIME-INR
INR: 2.86 — AB (ref 0.00–1.49)
Prothrombin Time: 29.5 seconds — ABNORMAL HIGH (ref 11.6–15.2)

## 2016-01-13 MED ORDER — ACETAMINOPHEN 500 MG PO TABS
1000.0000 mg | ORAL_TABLET | Freq: Four times a day (QID) | ORAL | Status: DC | PRN
  Administered 2016-01-13 – 2016-01-15 (×4): 1000 mg via ORAL
  Filled 2016-01-13 (×4): qty 2

## 2016-01-13 NOTE — Progress Notes (Signed)
Paged dr Roxan Hockey regarding more crepitus noted in rt flank area, portable cxr stat ordered. Dr Roxan Hockey came at bedside to assess cxr and patient. Will continue to monitor.

## 2016-01-13 NOTE — Progress Notes (Signed)
8 Days Post-Op Procedure(s) (LRB): RIGHT VIDEO ASSISTED THORACOSCOPY (VATS)/WEDGE RESECTION (Right) RESECTION OF PLEURAL MASS, right (N/A) RIGHT LOWER LOBE LOBECTOMY (Right) Subjective: No complaints  Objective: Vital signs in last 24 hours: Temp:  [97.7 F (36.5 C)-98.3 F (36.8 C)] 98.3 F (36.8 C) (03/31 0708) Pulse Rate:  [60-76] 60 (03/31 0708) Cardiac Rhythm:  [-] Atrial fibrillation (03/31 0708) Resp:  [12-23] 12 (03/31 0708) BP: (96-131)/(47-83) 100/59 mmHg (03/31 0708) SpO2:  [94 %-99 %] 99 % (03/31 0708)  Hemodynamic parameters for last 24 hours:    Intake/Output from previous day: 03/30 0701 - 03/31 0700 In: 1320 [P.O.:1320] Out: 180 [Chest Tube:180] Intake/Output this shift: Total I/O In: -  Out: 50 [Chest Tube:50]  General appearance: alert, cooperative and no distress Neurologic: intact Heart: regular rate and rhythm Lungs: diminished breath sounds right base Abdomen: normal findings: soft, non-tender no air leak  Lab Results:  Recent Labs  01/12/16 0518  WBC 4.2  HGB 11.3*  HCT 35.1*  PLT 177   BMET:  Recent Labs  01/12/16 0518  NA 133*  K 3.6  CL 96*  CO2 29  GLUCOSE 97  BUN 8  CREATININE 0.70  CALCIUM 8.5*    PT/INR:  Recent Labs  01/13/16 0555  LABPROT 29.5*  INR 2.86*   ABG    Component Value Date/Time   PHART 7.360 01/06/2016 0455   HCO3 23.5 01/06/2016 0455   TCO2 24.8 01/06/2016 0455   ACIDBASEDEF 1.2 01/06/2016 0455   O2SAT 97.9 01/06/2016 0455   CBG (last 3)  No results for input(s): GLUCAP in the last 72 hours.  Assessment/Plan: S/P Procedure(s) (LRB): RIGHT VIDEO ASSISTED THORACOSCOPY (VATS)/WEDGE RESECTION (Right) RESECTION OF PLEURAL MASS, right (N/A) RIGHT LOWER LOBE LOBECTOMY (Right) -  Dc chest tube Dc central line Therapeutic on coumadin Home tomorrow if no issues after CT removal   LOS: 8 days    Melrose Nakayama 01/13/2016

## 2016-01-14 ENCOUNTER — Inpatient Hospital Stay (HOSPITAL_COMMUNITY): Payer: Medicare Other

## 2016-01-14 LAB — PROTIME-INR
INR: 2.59 — ABNORMAL HIGH (ref 0.00–1.49)
Prothrombin Time: 27.4 seconds — ABNORMAL HIGH (ref 11.6–15.2)

## 2016-01-14 MED ORDER — PSYLLIUM 95 % PO PACK
1.0000 | PACK | Freq: Two times a day (BID) | ORAL | Status: DC
  Administered 2016-01-14 – 2016-01-17 (×7): 1 via ORAL
  Filled 2016-01-14 (×11): qty 1

## 2016-01-14 MED ORDER — BENEFIBER DRINK MIX PO PACK: 2.5000 mg | PACK | Freq: Two times a day (BID) | ORAL | Status: DC

## 2016-01-14 NOTE — Progress Notes (Addendum)
      [address]       [place] [address]             [phone]      9 Days Post-Op Procedure(s) (LRB): RIGHT VIDEO ASSISTED THORACOSCOPY (VATS)/WEDGE RESECTION (Right) RESECTION OF PLEURAL MASS, right (N/A) RIGHT LOWER LOBE LOBECTOMY (Right)   Subjective:  [name] states she is doing okay.  She states she had a episode of flushing and light headedness after getting up from bathroom.  She also states she feels like she has a little more "popcorn" on her side.  Finally her dressing has been changed several times due to drainage  Objective: Vital signs in last 24 hours: Temp:  [98 F (36.7 C)-98.5 F (36.9 C)] 98 F (36.7 C) (04/01 0312) Pulse Rate:  [62-79] 71 (04/01 0312) Cardiac Rhythm:  [-] Atrial fibrillation (04/01 0827) Resp:  [14-18] 14 (04/01 0750) BP: (100-143)/(49-80) 143/65 mmHg (04/01 0750) SpO2:  [97 %-100 %] 97 % (04/01 0312)  Intake/Output from previous day: 03/31 0701 - 04/01 0700 In: 960 [P.O.:960] Out: 50 [Chest Tube:50]  General appearance: alert, cooperative and no distress Heart: regular rate and rhythm Lungs: clear to auscultation bilaterally Abdomen: soft, non-tender; bowel sounds normal; no masses,  no organomegaly Wound: clean, serous drainage from chest tube sites, + crepitus extends into flank and rigth arm  Lab Results:  Recent Labs  01/12/16 0518  WBC 4.2  HGB 11.3*  HCT 35.1*  PLT 177   BMET:  Recent Labs  01/12/16 0518  NA 133*  K 3.6  CL 96*  CO2 29  GLUCOSE 97  BUN 8  CREATININE 0.70  CALCIUM 8.5*    PT/INR:  Recent Labs  01/14/16 0308  LABPROT 27.4*  INR 2.59*   ABG    Component Value Date/Time   PHART 7.360 01/06/2016 0455   HCO3 23.5 01/06/2016 0455   TCO2 24.8 01/06/2016 0455   ACIDBASEDEF 1.2 01/06/2016 0455   O2SAT 97.9 01/06/2016 0455   CBG (last 3)  No results for input(s): GLUCAP in the last 72 hours.  Assessment/Plan: S/P Procedure(s) (LRB): RIGHT VIDEO ASSISTED  THORACOSCOPY (VATS)/WEDGE RESECTION (Right) RESECTION OF PLEURAL MASS, right (N/A) RIGHT LOWER LOBE LOBECTOMY (Right)  1. Chest tube- removed yesterday, some development of sub q emphysema on right side and arm 2. Pulm- CXR with small right sided pneumothorax, no significant effusion present 3. Drainage from CT sites- all serous, no evidence of infection 4. INR 2.59, continue current regimen 5. Dispo- patient stable, appears to be increase in sub q air from yesterdays film with development of small pneumothorax, chest tube sites draining serous fluid continue dressing changes, probably keep patient one more day to ensure CXR remains stable   LOS: 9 days    BARRETT, [name] 01/14/2016  Patient seen and examined, agree with above. CXR looks a little better this AM Requesting a compression sleeve for right arm  Remo Lipps C. Roxan Hockey, MD [facility] [phone]

## 2016-01-15 ENCOUNTER — Inpatient Hospital Stay (HOSPITAL_COMMUNITY): Payer: Medicare Other

## 2016-01-15 LAB — PROTIME-INR
INR: 3.22 — ABNORMAL HIGH (ref 0.00–1.49)
Prothrombin Time: 32.3 seconds — ABNORMAL HIGH (ref 11.6–15.2)

## 2016-01-15 MED ORDER — CARVEDILOL 6.25 MG PO TABS
6.2500 mg | ORAL_TABLET | Freq: Two times a day (BID) | ORAL | Status: DC
  Administered 2016-01-15 – 2016-01-17 (×3): 6.25 mg via ORAL
  Filled 2016-01-15 (×3): qty 1

## 2016-01-15 MED ORDER — LIDOCAINE HCL (PF) 1 % IJ SOLN
30.0000 mL | Freq: Once | INTRAMUSCULAR | Status: DC
  Filled 2016-01-15: qty 30

## 2016-01-15 MED ORDER — WARFARIN SODIUM 4 MG PO TABS
4.0000 mg | ORAL_TABLET | Freq: Every day | ORAL | Status: DC
Start: 2016-01-16 — End: 2016-01-17
  Administered 2016-01-16: 4 mg via ORAL
  Filled 2016-01-15 (×2): qty 1

## 2016-01-15 MED ORDER — WARFARIN SODIUM 4 MG PO TABS
4.0000 mg | ORAL_TABLET | Freq: Every day | ORAL | Status: DC
  Filled 2016-01-15: qty 1

## 2016-01-15 NOTE — Progress Notes (Addendum)
      [address]       [place] [address]             [phone]      10 Days Post-Op Procedure(s) (LRB): RIGHT VIDEO ASSISTED THORACOSCOPY (VATS)/WEDGE RESECTION (Right) RESECTION OF PLEURAL MASS, right (N/A) RIGHT LOWER LOBE LOBECTOMY (Right)   Subjective:  [name] states her right arm feels more full of fluid this morning.  She also feels like the "popcorn" has extended into her neck.  Objective: Vital signs in last 24 hours: Temp:  [97.6 F (36.4 C)-98.3 F (36.8 C)] 98.3 F (36.8 C) (04/02 0321) Pulse Rate:  [59-68] 67 (04/02 0321) Cardiac Rhythm:  [-] Atrial fibrillation (04/02 0942) Resp:  [16-21] 21 (04/02 0321) BP: (90-120)/(48-68) 120/68 mmHg (04/02 0321) SpO2:  [98 %-99 %] 99 % (04/02 0321)  Intake/Output from previous day: 04/01 0701 - 04/02 0700 In: 370 [P.O.:360; I.V.:10] Out: -   General appearance: alert, cooperative and no distress Heart: regular rate and rhythm Lungs: clear to auscultation bilaterally Abdomen: soft, non-tender; bowel sounds normal; no masses,  no organomegaly Extremities: worsening air if right arm, extension fo sub q air into right neck Wound: clean and mild serous drainage present  Lab Results: No results for input(s): WBC, HGB, HCT, PLT in the last 72 hours. BMET: No results for input(s): NA, K, CL, CO2, GLUCOSE, BUN, CREATININE, CALCIUM in the last 72 hours.  PT/INR:  Recent Labs  01/15/16 0424  LABPROT 32.3*  INR 3.22*   ABG    Component Value Date/Time   PHART 7.360 01/06/2016 0455   HCO3 23.5 01/06/2016 0455   TCO2 24.8 01/06/2016 0455   ACIDBASEDEF 1.2 01/06/2016 0455   O2SAT 97.9 01/06/2016 0455   CBG (last 3)  No results for input(s): GLUCAP in the last 72 hours.  Assessment/Plan: S/P Procedure(s) (LRB): RIGHT VIDEO ASSISTED THORACOSCOPY (VATS)/WEDGE RESECTION (Right) RESECTION OF PLEURAL MASS, right (N/A) RIGHT LOWER LOBE LOBECTOMY (Right)  1.  Pulm- worsening of sub q air now  starting into neck- will likely need chest tube placed 2. CXR- shows stable appearance of apical/basilar pneumothorax, continue pulm toilet 3. INR therapeutic at 3.22 4. Dispo- patient stable, possible chest tube placement for worsening sub q air, repeat CXR in AM   LOS: 10 days    [name], Katie 01/15/2016  Patient seen and examined, agree with above Earlier today c/o subq air in neck. She thinks that has gone down a little bit. Would like to avoid placing CT with INR > 3 Hold coumadin today, then resume with 4 mg daily Will continue to monitor closely  Remo Lipps C. Roxan Hockey, MD [facility] [phone]

## 2016-01-15 NOTE — Progress Notes (Signed)
PT Cancellation Note  Patient Details Name: [name] MRN: [id number] DOB: 03/28/1934   Cancelled Treatment:    Reason Eval/Treat Not Completed: Medical issues which prohibited therapy.  Per RN, patient with decreased BP and "not to get up".  Will return tomorrow for PT evaluation.  **Order in chart for "needs RUE compression sleeve".  Patient will need to take a prescription to DME company for this per PT at [place].  Prescription should read "compression sleeve and glove/gauntlet".  These are available at [facility] and [employer] [place] supply stores - should be available at other DME/supply companies as well.  Thank you.     Despina Pole 01/15/2016, 5:40 PM Carita Pian. Sanjuana Kava, [place] Pager [phone]

## 2016-01-16 ENCOUNTER — Inpatient Hospital Stay (HOSPITAL_COMMUNITY): Payer: Medicare Other

## 2016-01-16 LAB — PROTIME-INR
INR: 2.14 — AB (ref 0.00–1.49)
Prothrombin Time: 23.8 seconds — ABNORMAL HIGH (ref 11.6–15.2)

## 2016-01-16 NOTE — Evaluation (Addendum)
Physical Therapy Evaluation Patient Details Name: [name] MRN: [id number] DOB: 1934-07-15 Today's Date: 01/16/2016   History of Present Illness  She is a 82 yo woman with a remote history of breast cancer (1988), hypertension, atrial fibrillation, arthritis and osteoporosis. She is a lifelong nonsmoker. She recently had a physical exam. There was a palpable abdominal mass. A CT of the abdomen and pelvis revealed a possible gastric leiomyoma. The "mass" was likely the left lobe of the liver. Also noted on CT were 2 right lung nodules. A PET CT was done which showed new metabolic activity in the 12 mm RLL nodule that was unchanged. The other nodule was interpreted as being a lung nodule but may actually be pleural based. It had doubled in size over 2 years but metabolic activity was indeterminate due to proximity to the liver. Pt s/p R VATs for 3/23.  Clinical Impression  Patient is s/p above surgery resulting in functional limitations due to the deficits listed below (see PT Problem List). Pt with noted R UE subcutaneous air. Working with Conservation officer, historic buildings on trailing ace wrap to manage being cautious of pushing it up into neck. Patient will benefit from skilled PT to increase their independence and safety with mobility to allow discharge to the venue listed below.  Pt with supportive family and will have 24/7 assist for 2-3 weeks.     Follow Up Recommendations No PT follow up;Supervision/Assistance - 24 hour    Equipment Recommendations  None recommended by PT    Recommendations for Other Services       Precautions / Restrictions Precautions Precautions: Fall Restrictions Weight Bearing Restrictions: No      Mobility  Bed Mobility               General bed mobility comments: pt up in chair, pt reports not having difficulties getting in/out of bed  Transfers Overall transfer level: Needs assistance Equipment used: Rolling walker (2 wheeled) Transfers: Sit to/from Stand Sit to  Stand: Supervision         General transfer comment: pt used arm rests  Ambulation/Gait Ambulation/Gait assistance: Min guard Ambulation Distance (Feet): 400 Feet Assistive device: Rolling walker (2 wheeled) Gait Pattern/deviations: WFL(Within Functional Limits) Gait velocity: wfl for age   General Gait Details: no episodes of LOB or instability, preferred to amb with RW  Stairs            Wheelchair Mobility    Modified Rankin (Stroke Patients Only)       Balance Overall balance assessment:  (needs RW for safe amb)                                           Pertinent Vitals/Pain Pain Assessment: No/denies pain    Home Living Family/patient expects to be discharged to:: Private residence Living Arrangements: Alone Available Help at Discharge: Family;Available 24 hours/day Type of Home: House Home Access: Level entry     Home Layout: One level Home Equipment: Walker - 2 wheels;Shower seat      Prior Function Level of Independence: Independent               Hand Dominance   Dominant Hand: Right    Extremity/Trunk Assessment   Upper Extremity Assessment: RUE deficits/detail RUE Deficits / Details: noted edema but is subcutaneous air however pt able to move UE well  Lower Extremity Assessment: Generalized weakness      Cervical / Trunk Assessment: Normal  Communication   Communication: No difficulties  Cognition Arousal/Alertness: Awake/alert Behavior During Therapy: WFL for tasks assessed/performed Overall Cognitive Status: Within Functional Limits for tasks assessed                      General Comments General comments (skin integrity, edema, etc.): discussed with RN ace wrapping R UE for sub cutaneous air management     Exercises        Assessment/Plan    PT Assessment Patient needs continued PT services  PT Diagnosis Difficulty walking;Generalized weakness   PT Problem List Decreased  strength;Decreased activity tolerance;Decreased balance;Decreased mobility  PT Treatment Interventions DME instruction;Gait training;Functional mobility training;Therapeutic activities;Therapeutic exercise   PT Goals (Current goals can be found in the Care Plan section) Acute Rehab PT Goals Patient Stated Goal: home PT Goal Formulation: With patient Time For Goal Achievement: 01/23/16 Potential to Achieve Goals: Good    Frequency Min 2X/week   Barriers to discharge        Co-evaluation               End of Session Equipment Utilized During Treatment: Gait belt Activity Tolerance: Patient tolerated treatment well Patient left: in chair;with call bell/phone within reach Nurse Communication: Mobility status         Time: 1435-1450 PT Time Calculation (min) (ACUTE ONLY): 15 min   Charges:   PT Evaluation $PT Eval Low Complexity: 1 Procedure     PT G CodesKingsley Callander 01/16/2016, 3:28 PM   Kittie Plater, PT, DPT Pager #: [phone] Office #: [phone]

## 2016-01-16 NOTE — Progress Notes (Signed)
Patient with a history of a fib and atrial tachycardia. She reportedly had a 2 second pause. Will ask cardiology to evaluate.

## 2016-01-16 NOTE — Progress Notes (Signed)
Notified dr Roxan Hockey regarding patient having a 2.10 sec pause, will continue to monitor.

## 2016-01-16 NOTE — Care Management Important Message (Signed)
Important Message  Patient Details  Name: [name] MRN: [id number] Date of Birth: 04/15/1934   Medicare Important Message Given:  Yes    Nathen May 01/16/2016, 11:35 AM

## 2016-01-16 NOTE — Progress Notes (Signed)
11 Days Post-Op Procedure(s) (LRB): RIGHT VIDEO ASSISTED THORACOSCOPY (VATS)/WEDGE RESECTION (Right) RESECTION OF PLEURAL MASS, right (N/A) RIGHT LOWER LOBE LOBECTOMY (Right) Subjective: Feels a little groggy this morning- took oxycodone late last night Denies any change in breathing or extent of subcutaneous air  Objective: Vital signs in last 24 hours: Temp:  [97.8 F (36.6 C)-98.5 F (36.9 C)] 98.1 F (36.7 C) (04/03 0350) Pulse Rate:  [45-106] 65 (04/03 0350) Cardiac Rhythm:  [-] Atrial fibrillation (04/03 0350) Resp:  [13-21] 17 (04/03 0350) BP: (80-139)/(45-74) 110/49 mmHg (04/03 0350) SpO2:  [88 %-100 %] 97 % (04/03 0350)  Hemodynamic parameters for last 24 hours:    Intake/Output from previous day: 04/02 0701 - 04/03 0700 In: 180 [P.O.:180] Out: -  Intake/Output this shift:    Alert no distress Cardiac- irregularly irregular Lungs decreased BS on R Subcutaneous emphysema unchanged  Lab Results: No results for input(s): WBC, HGB, HCT, PLT in the last 72 hours. BMET: No results for input(s): NA, K, CL, CO2, GLUCOSE, BUN, CREATININE, CALCIUM in the last 72 hours.  PT/INR:  Recent Labs  01/16/16 0458  LABPROT 23.8*  INR 2.14*   ABG    Component Value Date/Time   PHART 7.360 01/06/2016 0455   HCO3 23.5 01/06/2016 0455   TCO2 24.8 01/06/2016 0455   ACIDBASEDEF 1.2 01/06/2016 0455   O2SAT 97.9 01/06/2016 0455   CBG (last 3)  No results for input(s): GLUCAP in the last 72 hours.  Assessment/Plan: S/P Procedure(s) (LRB): RIGHT VIDEO ASSISTED THORACOSCOPY (VATS)/WEDGE RESECTION (Right) RESECTION OF PLEURAL MASS, right (N/A) RIGHT LOWER LOBE LOBECTOMY (Right) -  Symptomatically no worse today On exam subq emphysema is unchanged Will observe another day If worsens will need CT If stable or better will dc tomorrow INR= 2.14- resume coumadin at 4 mg daily   LOS: 11 days    [name] 01/16/2016

## 2016-01-17 ENCOUNTER — Inpatient Hospital Stay (HOSPITAL_COMMUNITY): Payer: Medicare Other

## 2016-01-17 DIAGNOSIS — I455 Other specified heart block: Secondary | ICD-10-CM

## 2016-01-17 DIAGNOSIS — I10 Essential (primary) hypertension: Secondary | ICD-10-CM

## 2016-01-17 DIAGNOSIS — I482 Chronic atrial fibrillation: Secondary | ICD-10-CM

## 2016-01-17 LAB — PROTIME-INR
INR: 1.94 — ABNORMAL HIGH (ref 0.00–1.49)
PROTHROMBIN TIME: 22 s — AB (ref 11.6–15.2)

## 2016-01-17 MED ORDER — OXYCODONE HCL 5 MG PO TABS: 5.0000 mg | ORAL_TABLET | Freq: Four times a day (QID) | ORAL | Status: DC | PRN

## 2016-01-17 MED ORDER — CARVEDILOL 6.25 MG PO TABS: 6.2500 mg | ORAL_TABLET | Freq: Two times a day (BID) | ORAL | Status: DC

## 2016-01-17 MED ORDER — IRBESARTAN 150 MG PO TABS: 150.0000 mg | ORAL_TABLET | Freq: Every day | ORAL | Status: DC

## 2016-01-17 MED ORDER — ACETAMINOPHEN 500 MG PO TABS: 1000.0000 mg | ORAL_TABLET | Freq: Four times a day (QID) | ORAL | Status: AC | PRN

## 2016-01-17 MED ORDER — CARVEDILOL 3.125 MG PO TABS: 3.1250 mg | ORAL_TABLET | Freq: Two times a day (BID) | ORAL | Status: DC

## 2016-01-17 NOTE — Consult Note (Signed)
Cardiology Consult    Patient ID: [name] MRN: [id number], DOB/AGE: 80-May-1935   Admit date: 01/05/2016 Date of Consult: 01/17/2016  Primary Physician: Marton Redwood, MD Reason for Consult: Bradycardia, Pauses Primary Cardiologist: New to [facility] Requesting Provider: Dr. Roxan Hockey  History of Present Illness    [name] is 82 y.o. female with past medical history of persistent atrial fibrillation (on Coumadin), HTN, breast cancer (s/p R mastectomy in 1988), tobacco abuse, and recently diagnosed 52m spiculated mass in the right lower lobe concerning for carcinoma who [facility] for a right VATS and right lower lobe wedge resection.  She tolerated the wedge resection well. 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 Chest tubes remained in place until 01/13/2016 and were removed at that time. She developed a small increase in subcutaneous air along the right side and spreading into her neck on 01/15/2016. Repeat CXR's have shown stable subcutaneous air without visible pneumothorax.   She was anticipating discharge today, until pauses were noted on her telemetry. She has been asymptomatic with the pauses. Prior to admission, she was on Coreg '25mg'$  BID, Amlodipine '5mg'$  daily, and Micardis '80mg'$  daily. Her Amlodipine and Micardis have been discontinued in the setting of hypotension. Her Coreg ws initially reduced to 12.'5mg'$  BID but on 01/15/2016, this was further decreased to 6.'25mg'$  BID. She reports feeling dizzy and having blurry vision on 01/15/2016, but her SBP was in the 70's at that time.   Telemetry shows pauses starting on 01/15/2016, lasting approximately 2 seconds. Over the past two days, she has continued to have multiple pauses ranging from 2.02 - 2.39 seconds. Her Coreg was further reduced to 3.'125mg'$  BID. Since, her HR has started to increase into the 80's.   She remains asymptomatic at this time. Denies any chest pain, palpitations, or shortness of  breath.   Was last seen by a Cardiologist 4+ years ago for pre-operative clearance prior to a knee surgery. She developed atrial fibrillation following her surgery and this has been managed by her PCP since. Denies any prior history of CAD.   Past Medical History   Past Medical History  Diagnosis Date  . HTN (hypertension)   . Atrial tachycardia ([facility]   . Atrial fibrillation ([facility])     on coumadin  . Cancer ([facility] 1988    breast cancer, R mastectomy, chemo  . Hemorrhoids 1991  . Arthritis 1996  . Diverticulitis 1999  . [place] [place]    left, non weight bearing for 2 1/2 weeks  . Urinary, incontinence, stress female   . Vitamin D deficiency disease   . Osteoporosis   . PONV (postoperative nausea and vomiting)   . GERD (gastroesophageal reflux disease)   . Lymphedema of upper extremity     right arm    Past Surgical History  Procedure Laterality Date  . Total hip arthroplasty    . Mastectomy partial / lumpectomy w/ axillary lymphadenectomy  04/05/1987    Right - Dr YAnnamaria Boots . Breast biopsy  05/04/1987    Right Dr YAnnamaria Boots . Tubal ligation Bilateral 1970  . Vaginal hysterectomy  1977    myomata  . Bunionectomy  1988  . Breast implant removal Right 06/05/10  . Eye surgery  5/14 ; 6/14    Cataract Surgery  . Joint replacement      2002 left; 2009 right Hip, rt knee 2012  . [facility]  [facility]    . Appendectomy    . Colonoscopy w/ polypectomy    . Video assisted thoracoscopy (vats)/wedge resection Right 01/05/2016    Procedure: RIGHT VIDEO ASSISTED THORACOSCOPY (VATS)/WEDGE RESECTION;  Surgeon: Melrose Nakayama, MD;  Location: [facility];  Service: Thoracic;  Laterality: Right;  . Resection of mediastinal mass N/A 01/05/2016    Procedure: RESECTION OF PLEURAL MASS, right;  Surgeon: Melrose Nakayama, MD;  Location: [facility];  Service: Thoracic;  Laterality: N/A;  . Lobectomy Right 01/05/2016    Procedure: RIGHT LOWER LOBE  LOBECTOMY;  Surgeon: Melrose Nakayama, MD;  Location: [facility];  Service: Thoracic;  Laterality: Right;     Allergies  Allergies  Allergen Reactions  . Aspirin Rash    Inpatient Medications    . bisacodyl  10 mg Oral Daily  . carvedilol  3.125 mg Oral BID WC  . cholecalciferol  500 Units Oral Daily  . [START ON 01/18/2016] irbesartan  150 mg Oral Daily  . lidocaine (PF)  30 mL Intradermal Once  . multivitamin with minerals  1 tablet Oral QPM  . omega-3 acid ethyl esters  1 g Oral BID  . psyllium  1 packet Oral BID  . senna-docusate  1 tablet Oral QHS  . sodium chloride flush  10-40 mL Intracatheter Q12H  . warfarin  4 mg Oral q1800  . warfarin   Does not apply Once  . Warfarin - Physician Dosing Inpatient   Does not apply q35    Family History    Family History  Problem Relation Age of Onset  . Colon cancer Mother   . Colon cancer Father   . Heart disease Father     Social History    Social History   Social History  . Marital Status: Widowed    Spouse Name: N/A  . Number of Children: N/A  . Years of Education: N/A   Occupational History  . Not on file.   Social History Main Topics  . Smoking status: Never Smoker   . Smokeless tobacco: Never Used  . Alcohol Use: No  . Drug Use: No  . Sexual Activity: No     Comment: hysterectomy   Other Topics Concern  . Not on file   Social History Narrative     Review of Systems    General:  No chills, fever, night sweats or weight changes.  Cardiovascular:  No chest pain, dyspnea on exertion, edema, orthopnea, palpitations, paroxysmal nocturnal dyspnea. Dermatological: No rash, lesions/masses Respiratory: No cough, Positive for mild dyspnea Urologic: No hematuria, dysuria Abdominal:   No nausea, vomiting, diarrhea, bright red blood per rectum, melena, or hematemesis Neurologic:  No visual changes, wkns, changes in mental status. All other systems reviewed and are otherwise negative except as noted  above.  Physical Exam    Blood pressure 82/42, pulse 74, temperature 97.5 F (36.4 C), temperature source Oral, resp. rate 24, height '5\' 1"'$  (1.549 m), weight 149 lb 11.1 oz (67.9 kg), last menstrual period 10/16/1975, SpO2 98 %.  General: Pleasant, elderly Caucasian female appearing in NAD. Psych: Normal affect. Neuro: Alert and oriented X 3. Moves all extremities spontaneously. HEENT: Normal  Neck: Supple without bruits or JVD. Lungs:  Resp regular and unlabored, CTA without wheezing or rales. Heart: Irregularly irregular no s3, s4, or murmurs. Abdomen: Soft, non-tender, non-distended, BS + x 4.  Extremities: No clubbing or cyanosis. Trace lower extremity edema. DP/PT/Radials 2+ and equal bilaterally.  Labs    Troponin [facility]  of Care Test) No results for input(s): TROPIPOC in the last 72 hours. No results for input(s): CKTOTAL, CKMB, TROPONINI in the last 72 hours. Lab Results  Component Value Date   WBC 4.2 01/12/2016   HGB 11.3* 01/12/2016   HCT 35.1* 01/12/2016   MCV 95.6 01/12/2016   PLT 177 01/12/2016    Recent Labs Lab 01/12/16 0518  NA 133*  K 3.6  CL 96*  CO2 29  BUN 8  CREATININE 0.70  CALCIUM 8.5*  GLUCOSE 97   Lab Results  Component Value Date   CHOL 201* 04/09/2007   HDL 65.9 04/09/2007   TRIG 50 04/09/2007     Radiology Studies    Dg Chest Port 1 View: 01/29/16  CLINICAL DATA:  Status post lobectomy of lung EXAM: PORTABLE CHEST 1 VIEW COMPARISON:  01/16/2016 FINDINGS: Postoperative changes on the right. Diffuse subcutaneous emphysema is stable. Small right pleural effusion. No visible pneumothorax. Heart is borderline in size. Right basilar atelectasis. No focal opacity on the left. IMPRESSION: Stable subcutaneous air without visible pneumothorax. Small right pleural effusion with right base atelectasis. Electronically Signed   By: Rolm Baptise M.D.   On: Jan 29, 2016 08:10   Dg Chest Port 1 View: 01/16/2016  CLINICAL DATA:  Pneumothorax.  Cough. EXAM:  PORTABLE CHEST 1 VIEW COMPARISON:  01/15/2016. FINDINGS: Mediastinum and hilar structures normal. Heart size stable. Right pleural effusion. Surgical staples and clips noted over the right chest. Low lung volumes. Small right pleural effusion. Previously identified small right apical pneumothorax not definitely identified on today's exam. Bilateral chest wall subcutaneous emphysema right side greater than left is again noted. IMPRESSION: 1. Previously identified small right pneumothorax not definitely identified on today's exam. Persistent bilateral chest wall subcutaneous emphysema, right side greater than left again noted. 2. Persistent small right pleural effusion. Electronically Signed   By: Marcello Moores  Register   On: 01/16/2016 07:46    EKG & Cardiac Imaging    EKG: Atrial fibrillation with slow ventricular response, HR 55.  Assessment & Plan    1. Sinus Pauses on Telemetry - Telemetry shows pauses starting on 01/15/2016, lasting approximately 2 seconds. Over the past two days, she has continued to have multiple pauses ranging from 2.02 - 2.39 seconds. She is asymptomatic with the pauses - Her Coreg has been decreased from '25mg'$  BID to 3.'125mg'$  BID. With the reduced dose, her HR has increased into the 80's while at rest.  - will increase Coreg back to 6.'25mg'$  BID. Recommend stopping her other BP medications as it has been soft for the past several days. - discussed with Dr. Martin[place]. She is stable for discharge from a Cardiology perspective. I will arrange 2-week hospital follow-up so we can titrate her BP medications at that time if needed.  2. Persistent atrial fibrillation - This patients CHA2DS2-VASc Score and unadjusted Ischemic Stroke Rate (% per year) is equal to 4.8 % stroke rate/year from a score of 4 (HTN, Female, Age 69)). Continue Coumadin. - continue Coreg 6.'25mg'$  BID for rate control.  3. HTN - BP has been 81/40 - 138/82 in the past 24 hours. - would hold Amlodipine and Micardis at time of  discharge with recent soft BP readings. Continue Coreg at 6.'125mg'$  BID.   4.Resection of right lower lobe carcinoma - per TCTS   Signed, Erma Heritage, PA-C 2016-01-29, 2:02 PM Pager: [phone]

## 2016-01-17 NOTE — Progress Notes (Addendum)
      [address]       [place] [address]             [phone]        RIGHT VIDEO ASSISTED THORACOSCOPY (VATS)/WEDGE RESECTION (Right) RESECTION OF PLEURAL MASS, right (N/A) RIGHT LOWER LOBE LOBECTOMY (Right)  Subjective: Patient states her breathing is fairly good.  Objective: Vital signs in last 24 hours: Temp:  [97.6 F (36.4 C)-98.5 F (36.9 C)] 97.6 F (36.4 C) (04/04 0815) Pulse Rate:  [74-78] 74 (04/04 0345) Cardiac Rhythm:  [-] Atrial fibrillation (04/04 0345) Resp:  [14-25] 15 (04/04 0345) BP: (98-138)/(45-82) 116/65 mmHg (04/04 0345) SpO2:  [95 %-98 %] 95 % (04/04 0345)     Intake/Output from previous day: 04/03 0701 - 04/04 0700 In: 1200 [P.O.:1200] Out: -    Physical Exam:  Cardiovascular: IRRR IRRR Pulmonary: Clear to auscultation on left and slightly diminished at right base. Minor subcutaneous emphysema right chest wall. Abdomen: Soft, non tender, bowel sounds present. Extremities: Ted hose in place Wounds: Serous drainage from chest tube site.    Lab Results: CBC: No results for input(s): WBC, HGB, HCT, PLT in the last 72 hours. BMET:  No results for input(s): NA, K, CL, CO2, GLUCOSE, BUN, CREATININE, CALCIUM in the last 72 hours.  PT/INR:   Recent Labs  01/17/16 0339  LABPROT 22.0*  INR 1.94*   ABG:  INR: Will add last result for INR, ABG once components are confirmed Will add last 4 CBG results once components are confirmed  Assessment/Plan:  1. CV - History of a fib and atrial tachycardic.  Had a 2 second pause yesterday.On Coreg 6.25 mg bid, Avapro 300 mg daily, and Coumadin.INR decreased from to 2.14 to 1.94. 2.  Pulmonary - On room air.  CXR shows stable right subcutaneous emphysema on right lateral chest, no pneumothorax, small right pleural effusion and atelectasis at base.  Encourage incentive spirometer.  3. Will arrange for [place] 4. Will discharge after seen by cardiology   Ruhama Lehew  MPA-C 01/17/2016,8:38 AM

## 2016-01-17 NOTE — Progress Notes (Signed)
Patient had bradycardia earlier. Her BP is more labile of late. I will decrease Coreg and Avapro. Cardiology has not seen patient yet. Will hold discharge for now.

## 2016-01-17 NOTE — Care Management Note (Signed)
Case Management Note  Patient Details  Name: DALARY HOLLAR MRN: [id number] Date of Birth: Dec 19, 1933  Subjective/Objective:   Patient is for dc today, she chose [facility] but they will not be able to see patient until 4/10, patient states if [facility] does not have the staff , then choose [facility] for [facility] for wound care (dressing changes).  Referral made to [facility] with [facility].  Soc will begin 24-48 hrs post dc.                    Action/Plan:   Expected Discharge Date:                  Expected Discharge Plan:  [place]  In-House Referral:     Discharge planning Services  CM Consult  Post Acute Care Choice:  Home Health Choice offered to:  Patient  DME Arranged:    DME Agency:     HH Arranged:  RN [place] Agency:  [place]  Status of Service:  Completed, signed off  Medicare Important Message Given:  Yes Date Medicare IM Given:    Medicare IM give by:    Date Additional Medicare IM Given:    Additional Medicare Important Message give by:     If discussed at [place] of Stay Meetings, dates discussed:    Additional Comments:  Zenon Mayo, RN 01/17/2016, 10:50 AM

## 2016-01-17 NOTE — Progress Notes (Signed)
Notified Zimmerman PA of pts HR in 40s for 1 minute. No new orders at this time. Awaiting cardiology to see pt. Will continue to monitor pt.

## 2016-01-17 NOTE — Progress Notes (Addendum)
Discharge instructions given. No questions or concerns at this time. Pt d/c'd via family vehicle. IV d/c'd. Tip intact. Pt tolerated well.

## 2016-01-19 ENCOUNTER — Other Ambulatory Visit: Payer: Self-pay | Admitting: *Deleted

## 2016-01-19 DIAGNOSIS — C3431 Malignant neoplasm of lower lobe, right bronchus or lung: Secondary | ICD-10-CM | POA: Diagnosis not present

## 2016-01-19 DIAGNOSIS — Z902 Acquired absence of lung [part of]: Secondary | ICD-10-CM

## 2016-01-19 DIAGNOSIS — Z483 Aftercare following surgery for neoplasm: Secondary | ICD-10-CM | POA: Diagnosis not present

## 2016-01-24 ENCOUNTER — Encounter: Payer: Self-pay | Admitting: Thoracic Surgery (Cardiothoracic Vascular Surgery)

## 2016-01-24 ENCOUNTER — Ambulatory Visit: Payer: Medicare Other | Admitting: Cardiology

## 2016-01-24 ENCOUNTER — Telehealth: Payer: Self-pay | Admitting: *Deleted

## 2016-01-24 ENCOUNTER — Ambulatory Visit
Admission: RE | Admit: 2016-01-24 | Discharge: 2016-01-24 | Disposition: A | Discharge status: Home / Self Care | Payer: Medicare Other | Source: Ambulatory Visit | Attending: Thoracic Surgery (Cardiothoracic Vascular Surgery) | Admitting: Thoracic Surgery (Cardiothoracic Vascular Surgery)

## 2016-01-24 ENCOUNTER — Ambulatory Visit (INDEPENDENT_AMBULATORY_CARE_PROVIDER_SITE_OTHER): Payer: Self-pay | Admitting: Thoracic Surgery (Cardiothoracic Vascular Surgery)

## 2016-01-24 VITALS — BP 146/80 | HR 74 | Resp 16 | Ht 61.0 in | Wt 144.0 lb

## 2016-01-24 DIAGNOSIS — J986 Disorders of diaphragm: Secondary | ICD-10-CM

## 2016-01-24 DIAGNOSIS — Z902 Acquired absence of lung [part of]: Secondary | ICD-10-CM

## 2016-01-24 DIAGNOSIS — D1431 Benign neoplasm of right bronchus and lung: Secondary | ICD-10-CM

## 2016-01-24 DIAGNOSIS — Z09 Encounter for follow-up examination after completed treatment for conditions other than malignant neoplasm: Secondary | ICD-10-CM

## 2016-01-24 MED ORDER — OXYCODONE HCL 5 MG PO TABS: 5.0000 mg | ORAL_TABLET | Freq: Four times a day (QID) | ORAL | Status: DC | PRN

## 2016-01-24 NOTE — Telephone Encounter (Signed)
Oncology Nurse Navigator Documentation  Oncology Nurse Navigator Flowsheets 01/24/2016  Navigator Encounter Type Telephone  Telephone Outgoing Call  Abnormal Finding Date 11/04/2015  Confirmed Diagnosis Date 01/05/2016  Surgery Date 01/05/2016  Treatment Initiated Date 01/05/2016  Treatment Phase Post-Tx Follow-up  Barriers/Navigation Needs No barriers at this time  Interventions None required  Acuity Level 1  Acuity Level 1 Minimal follow up required  Time Spent with Patient 15   I called to follow up with [name] today.  She is doing well.  She had her staples removed today at Dr. Leonarda Salon office and stated everything is going well.  No barriers identified at this time.

## 2016-01-24 NOTE — Progress Notes (Signed)
[address]       [place] [address]             [phone]       HPI: [name] returns today for scheduled postoperative follow-up visit.  She is a 82 year old woman who underwent a thoracoscopic right lower lobectomy for carcinoid tumor and excision of the diaphragmatic cyst on 01/05/2016. Postoperative course was complicated by a prolonged air leak. She developed a lot of subcutaneous emphysema after her chest tube was removed, but did not have to have a new tube placed. She remains stable for several days and then was discharged.  She is feeling well. She still is aware of the subcutaneous emphysema. She is taking Tylenol during the day but is using oxycodone at night for pain. She is walking every day. She did quite a bit of walking yesterday when she went to see her primary care, and feels more tired and sore today because of that.  Past Medical History  Diagnosis Date  . HTN (hypertension)   . Atrial tachycardia ([place])   . Atrial fibrillation (HCC)     on coumadin  . Cancer [facility]) 1988    breast cancer, R mastectomy, chemo  . Hemorrhoids 1991  . Arthritis 1996  . Diverticulitis 1999  . Femur fracture ([place]) 03/2013    left, non weight bearing for 2 1/2 weeks  . Urinary, incontinence, stress female   . Vitamin D deficiency disease   . Osteoporosis   . PONV (postoperative nausea and vomiting)   . GERD (gastroesophageal reflux disease)   . Lymphedema of upper extremity     right arm      Current Outpatient Prescriptions  Medication Sig Dispense Refill  . acetaminophen (TYLENOL) 500 MG tablet Take 2 tablets (1,000 mg total) by mouth every 6 (six) hours as needed for mild pain. 30 tablet 0  . Calcium Carbonate-Vitamin D (CALTRATE 600+D) 600-400 MG-UNIT per tablet Take 1 tablet by mouth daily.    . carboxymethylcellulose (REFRESH PLUS) 0.5 % SOLN Place 1 drop into both eyes 2 (two) times daily as needed (for dry eyes).    . carvedilol (COREG) 6.25 MG  tablet Take 1 tablet (6.25 mg total) by mouth 2 (two) times daily with a meal. 60 tablet 1  . Ergocalciferol (VITAMIN D2) 400 units TABS Take 400 Units by mouth daily.    . Estradiol (VAGIFEM) 10 MCG TABS vaginal tablet Place 1 tablet (10 mcg total) vaginally 2 (two) times a week. 24 tablet 3  . Multiple Vitamin (MULTIVITAMIN WITH MINERALS) TABS Take 1 tablet by mouth every evening.    . Omega-3 Fatty Acids (FISH OIL) 1000 MG CAPS Take 1 capsule by mouth 2 (two) times daily.    Marland Kitchen OVER THE COUNTER MEDICATION Macular degeneration supplement.  2 times daily.  Receives from Dr. Payton Emerald office    . oxyCODONE (OXY IR/ROXICODONE) 5 MG immediate release tablet Take 1 tablet (5 mg total) by mouth every 6 (six) hours as needed for severe pain. 20 tablet 0  . telmisartan (MICARDIS) 40 MG tablet Take 40 mg by mouth daily.    Marland Kitchen warfarin (COUMADIN) 4 MG tablet Take 4-6 mg by mouth every evening. Take '6mg'$  Monday, Wednesday, and Friday. Take '4mg'$  all the other days of the week.    . Wheat Dextrin (BENEFIBER DRINK MIX PO) Take 2.5 mg by mouth 2 (two) times daily.     No current facility-administered medications for this visit.  Physical Exam BP 146/80 mmHg  Pulse 74  Resp 16  Ht '5\' 1"'$  (1.549 m)  Wt 144 lb (65.318 kg)  BMI 27.22 kg/m2  SpO2 98%  LMP 10/16/1975 (Approximate) 82 year old woman in no acute distress Alert and oriented 3 with no focal neurologic deficits Decreased subcutaneous emphysema and right neck, shoulder and chest wall Lungs diminished right base, otherwise clear Cardiac regular rate and rhythm normal S1 and S2  Diagnostic Tests: CHEST 2 VIEW  COMPARISON: Portable chest x-rays of January 17, 2016, January 16, 2016, and January 15, 2016.  FINDINGS: There remains considerable subcutaneous emphysema on the right. Only a small amount of subcutaneous emphysema persists on the left. A faint pleural line is seen in the right pulmonary apex consistent with a 5% or less pneumothorax.  There is persistent volume loss at the right lung base with blunting of the costophrenic angle. There is small air-fluid level projecting in the posterior aspect of the right lower hemithorax. There is a new larger air-fluid level more superiorly in the posterior aspect of the right hemi thorax. The left lung is clear. There is no significant mediastinal shift. The heart is normal in size. The pulmonary vascularity is not engorged. There is stable skin staples in the right axillary region and vascular clips in the high right axillary region.  IMPRESSION: 1. A less than 5% right apical pneumothorax is visible today with considerable remaining subcutaneous emphysema in the right axillary region and the base of the neck. The pneumothorax appears larger than that seen on the January 15, 2016 chest x-ray. 2. Two loculated air-fluid levels in the posterior aspect of the lower hemithorax. The larger one appears new and lies superiorly. The smaller one has decreased in size since the previous study. 3. The left lung is clear. There is no CHF.  If the patient has current clinical symptoms that merit further imaging, chest CT scanning would be the most useful next imaging step.   Electronically Signed  By: David Martinique M.D.  On: 01/24/2016 10:32  Impression: 82 year old woman who is about 2 1/2 weeks out from a thoracoscopic right lower lobectomy for carcinoid tumor. This was a stage IA well-differentiated carcinoid.  She had a prolonged air leak postoperatively and developed subcutaneous emphysema after chest tube was removed. She has had some significant improvement in the amount of subcutaneous emphysema since discharge the hospital. Her chest x-ray does show a couple of small air-fluid levels, which is not concerning at this point in time.  I encouraged her to continue walking as she is doing and gradually increasing her activities.  Her blood pressure was elevated today. She was  started back on half of her normal dose of micardis yesterday and she is seeing her cardiologist soon. I did not make any changes to her medications today.  I did give her a prescription for an additional 20 5 mg oxycodone tablets to use as needed for pain. She is mostly taking as before she goes to bed at night.  Plan: I will plan to see her back in 2 weeks to check on her progress. We'll do a chest x-ray at that time.  Katie Nakayama, MD [facility] [phone]

## 2016-01-26 ENCOUNTER — Encounter: Payer: Self-pay | Admitting: Cardiology

## 2016-01-26 ENCOUNTER — Ambulatory Visit (INDEPENDENT_AMBULATORY_CARE_PROVIDER_SITE_OTHER): Payer: Medicare Other | Admitting: Cardiology

## 2016-01-26 VITALS — BP 166/80 | HR 60 | Ht 61.0 in | Wt 143.2 lb

## 2016-01-26 DIAGNOSIS — I482 Chronic atrial fibrillation, unspecified: Secondary | ICD-10-CM

## 2016-01-26 DIAGNOSIS — I10 Essential (primary) hypertension: Secondary | ICD-10-CM

## 2016-01-26 DIAGNOSIS — Z7901 Long term (current) use of anticoagulants: Secondary | ICD-10-CM | POA: Diagnosis not present

## 2016-01-26 DIAGNOSIS — Z902 Acquired absence of lung [part of]: Secondary | ICD-10-CM | POA: Diagnosis not present

## 2016-01-26 NOTE — Assessment & Plan Note (Signed)
S/P Rt VATS 01/05/16

## 2016-01-26 NOTE — Progress Notes (Signed)
01/26/2016 EDDA OREA   December 15, 1933  [id number]  Primary Physician Marton Redwood, MD Primary Cardiologist: Dr Martinique (new)  HPI:  Pleasant 82 y/o female followed for many years by Dr Brigitte Pulse with CAF- on Coumadin- and HTN. She has no history of MI or prior cardiac evaluation. She was admitted for Rt VATS and lobectomy for carcinoid tumor and excision of diaphragmatic cyst on 01/05/16. She was seen by cardiology post op secondary to pauses of 2 seconds and slow AF rates noted on telemetry. Her B/P post op was soft as well and her antihypertensives were held. We recommended a lower dose of Coreg at discharge. She saw her PCP earlier this week and her Micardis was resumed at a lower dose. She is in the office today for cardiology follow up. She continues to recover. She denies any syncope or near syncope. She denies any unusual dyspnea. EKG shows AF at 60.   Current Outpatient Prescriptions  Medication Sig Dispense Refill  . acetaminophen (TYLENOL) 500 MG tablet Take 2 tablets (1,000 mg total) by mouth every 6 (six) hours as needed for mild pain. 30 tablet 0  . Calcium Carbonate-Vitamin D (CALTRATE 600+D) 600-400 MG-UNIT per tablet Take 1 tablet by mouth daily.    . carboxymethylcellulose (REFRESH PLUS) 0.5 % SOLN Place 1 drop into both eyes 2 (two) times daily as needed (for dry eyes).    . carvedilol (COREG) 6.25 MG tablet Take 1 tablet (6.25 mg total) by mouth 2 (two) times daily with a meal. 60 tablet 1  . Ergocalciferol (VITAMIN D2) 400 units TABS Take 400 Units by mouth daily.    . Estradiol (VAGIFEM) 10 MCG TABS vaginal tablet Place 1 tablet (10 mcg total) vaginally 2 (two) times a week. 24 tablet 3  . Multiple Vitamin (MULTIVITAMIN WITH MINERALS) TABS Take 1 tablet by mouth every evening.    . Omega-3 Fatty Acids (FISH OIL) 1000 MG CAPS Take 1 capsule by mouth 2 (two) times daily.    Marland Kitchen OVER THE COUNTER MEDICATION Macular degeneration supplement.  2 times daily.  Receives from Dr. Payton Emerald  office    . oxyCODONE (OXY IR/ROXICODONE) 5 MG immediate release tablet Take 1 tablet (5 mg total) by mouth every 6 (six) hours as needed for severe pain. 20 tablet 0  . telmisartan (MICARDIS) 40 MG tablet Take 40 mg by mouth daily.    Marland Kitchen warfarin (COUMADIN) 4 MG tablet Take 4-6 mg by mouth every evening. Take '6mg'$  Monday, Wednesday, and Friday. Take '4mg'$  all the other days of the week.    . Wheat Dextrin (BENEFIBER DRINK MIX PO) Take 2.5 mg by mouth 2 (two) times daily. 4 MG EVERYDAY BUT Tuesday AND Friday TAKE 6 MG     No current facility-administered medications for this visit.    Allergies  Allergen Reactions  . Aspirin Rash    Social History   Social History  . Marital Status: Widowed    Spouse Name: N/A  . Number of Children: N/A  . Years of Education: N/A   Occupational History  . Not on file.   Social History Main Topics  . Smoking status: Never Smoker   . Smokeless tobacco: Never Used  . Alcohol Use: No  . Drug Use: No  . Sexual Activity: No     Comment: hysterectomy   Other Topics Concern  . Not on file   Social History Narrative     Review of Systems: General: negative for chills, fever, night sweats  or weight changes.  Cardiovascular: negative for chest pain, dyspnea on exertion, edema, orthopnea, palpitations, paroxysmal nocturnal dyspnea or shortness of breath Dermatological: negative for rash Respiratory: negative for cough or wheezing Urologic: negative for hematuria Abdominal: negative for nausea, vomiting, diarrhea, bright red blood per rectum, melena, or hematemesis Neurologic: negative for visual changes, syncope, or dizziness All other systems reviewed and are otherwise negative except as noted above.    Blood pressure 166/80, pulse 60, height '5\' 1"'$  (1.549 m), weight 143 lb 3.2 oz (64.955 kg), last menstrual period 10/16/1975, SpO2 98 %.  General appearance: alert, cooperative, no distress and pale Lungs: decreased Rt base Heart: irregularly  irregular rhythm Extremities: lymphadema Rt arm  EKG AF-VR 60  ASSESSMENT AND PLAN:   Atrial fibrillation, chronic (HCC) CAF followed by Dr Brigitte Pulse. Seen by cardiology for the first time  01/17/16 for 2 second pauses and slow rates post VATS Her rate is 60 today on decreased Coreg  Essential hypertension She was hypotensive post op and medications were held. Micardis resumed a few days ago by her PCP as her B/P is trending up now.   Long term (current) use of anticoagulants Chronic Coumadin Rx (CHADs VASc=4) followed by Dr Brigitte Pulse  S/P lobectomy of lung S/P Rt VATS 01/05/16   PLAN  The pt has been followed for years  By Dr Brigitte Pulse for her HTN and CAF. She seems to be doing well on the reduced dose of Coreg. We will defer treatment of her HTN to her PCP. Dr Martinique can see her in 6 months. At some point we may want to consider an echo but will defer this for now.   Kerin Ransom K PA-C 01/26/2016 2:36 PM

## 2016-01-26 NOTE — Assessment & Plan Note (Signed)
Chronic Coumadin Rx (CHADs VASc=4) followed by Dr Brigitte Pulse

## 2016-01-26 NOTE — Assessment & Plan Note (Signed)
She was hypotensive post op and medications were held. Micardis resumed a few days ago by her PCP as her B/P is trending up now.

## 2016-01-26 NOTE — Patient Instructions (Signed)
Your physician wants you to follow-up in: 6 Months You will receive a reminder letter in the mail two months in advance. If you don't receive a letter, please call our office to schedule the follow-up appointment.  

## 2016-01-26 NOTE — Assessment & Plan Note (Signed)
CAF followed by Dr Brigitte Pulse. Seen by cardiology for the first time  01/17/16 for 2 second pauses and slow rates post VATS Her rate is 60 today on decreased Coreg

## 2016-02-06 ENCOUNTER — Other Ambulatory Visit: Payer: Self-pay | Admitting: Thoracic Surgery (Cardiothoracic Vascular Surgery)

## 2016-02-06 DIAGNOSIS — Z902 Acquired absence of lung [part of]: Secondary | ICD-10-CM

## 2016-02-07 ENCOUNTER — Ambulatory Visit: Payer: Self-pay | Admitting: Thoracic Surgery (Cardiothoracic Vascular Surgery)

## 2016-02-14 ENCOUNTER — Ambulatory Visit
Admission: RE | Admit: 2016-02-14 | Discharge: 2016-02-14 | Disposition: A | Discharge status: Home / Self Care | Payer: Medicare Other | Source: Ambulatory Visit | Attending: Thoracic Surgery (Cardiothoracic Vascular Surgery) | Admitting: Thoracic Surgery (Cardiothoracic Vascular Surgery)

## 2016-02-14 ENCOUNTER — Ambulatory Visit (INDEPENDENT_AMBULATORY_CARE_PROVIDER_SITE_OTHER): Payer: Self-pay | Admitting: Thoracic Surgery (Cardiothoracic Vascular Surgery)

## 2016-02-14 ENCOUNTER — Encounter: Payer: Self-pay | Admitting: Thoracic Surgery (Cardiothoracic Vascular Surgery)

## 2016-02-14 VITALS — BP 154/84 | HR 84 | Resp 16 | Ht 61.0 in | Wt 133.0 lb

## 2016-02-14 DIAGNOSIS — J986 Disorders of diaphragm: Secondary | ICD-10-CM

## 2016-02-14 DIAGNOSIS — Z902 Acquired absence of lung [part of]: Secondary | ICD-10-CM

## 2016-02-14 DIAGNOSIS — D1431 Benign neoplasm of right bronchus and lung: Secondary | ICD-10-CM

## 2016-02-14 NOTE — Progress Notes (Signed)
[address]       [place] [address]             [phone]       HPI: [name] returns today for a scheduled follow-up visit.  She is a 82 yo woman who underwent a thoracoscopic right lower lobectomy for a carcinoid tumor and excision of a benign diaphragmatic cyst on 01/05/2016. Her postoperative course was complicated by a prolonged air leak. She developed a lot of subcutaneous emphysema after her chest tube was removed, but did not have to have a new tube placed. She remained stable for several days and then was discharged on 01/17/2016.  I saw her in the office a week later and she was making good progress.  Since then she has been feeling well. She has not used any narcotics over the past 2 weeks. She says that it feels more numb and painful. She has noted some redness around the chest tube sites. She has not had any drainage. She has not had any problems with shortness of breath or wheezing.  Past Medical History  Diagnosis Date  . HTN (hypertension)   . Atrial tachycardia ([place])   . Atrial fibrillation (HCC)     on coumadin  . Cancer [facility]) 1988    breast cancer, R mastectomy, chemo  . Hemorrhoids 1991  . Arthritis 1996  . Diverticulitis 1999  . Femur fracture ([place]) 03/2013    left, non weight bearing for 2 1/2 weeks  . Urinary, incontinence, stress female   . Vitamin D deficiency disease   . Osteoporosis   . PONV (postoperative nausea and vomiting)   . GERD (gastroesophageal reflux disease)   . Lymphedema of upper extremity     right arm      Current Outpatient Prescriptions  Medication Sig Dispense Refill  . acetaminophen (TYLENOL) 500 MG tablet Take 2 tablets (1,000 mg total) by mouth every 6 (six) hours as needed for mild pain. 30 tablet 0  . Calcium Carbonate-Vitamin D (CALTRATE 600+D) 600-400 MG-UNIT per tablet Take 1 tablet by mouth daily.    . carboxymethylcellulose (REFRESH PLUS) 0.5 % SOLN Place 1 drop into both eyes 2 (two)  times daily as needed (for dry eyes).    . carvedilol (COREG) 6.25 MG tablet Take 1 tablet (6.25 mg total) by mouth 2 (two) times daily with a meal. 60 tablet 1  . Ergocalciferol (VITAMIN D2) 400 units TABS Take 400 Units by mouth daily.    . Estradiol (VAGIFEM) 10 MCG TABS vaginal tablet Place 1 tablet (10 mcg total) vaginally 2 (two) times a week. 24 tablet 3  . Multiple Vitamin (MULTIVITAMIN WITH MINERALS) TABS Take 1 tablet by mouth every evening.    . Omega-3 Fatty Acids (FISH OIL) 1000 MG CAPS Take 1 capsule by mouth 2 (two) times daily.    Marland Kitchen OVER THE COUNTER MEDICATION Macular degeneration supplement.  2 times daily.  Receives from Dr. Payton Emerald office    . telmisartan (MICARDIS) 40 MG tablet Take 40 mg by mouth daily.    Marland Kitchen warfarin (COUMADIN) 4 MG tablet Take 4-6 mg by mouth every evening. Take '6mg'$  Monday, Wednesday, and Friday. Take '4mg'$  all the other days of the week.    . Wheat Dextrin (BENEFIBER DRINK MIX PO) Take 2.5 mg by mouth 2 (two) times daily. 4 MG EVERYDAY BUT Tuesday AND Friday TAKE 6 MG     No current facility-administered medications for this visit.  Physical Exam BP 154/84 mmHg  Pulse 84  Resp 16  Ht '5\' 1"'$  (1.549 m)  Wt 133 lb (60.328 kg)  BMI 25.14 kg/m2  SpO2 98%  LMP 10/16/1975 (Approximate) 81 year old woman in no acute distress Alert and oriented 3 with no focal deficits Lungs diminished the right base, otherwise clear Incisions well healed Minimal erythema around chest tube sites  Diagnostic Tests: CHEST 2 VIEW  COMPARISON: 01/24/2016  FINDINGS: Enlargement of cardiac silhouette with slight vascular congestion.  Mediastinal contours normal for degree of thoracolumbar scoliosis.  RIGHT pleural effusion and basilar atelectasis.  Underlying emphysematous changes.  Small amount residual chest wall emphysema lateral RIGHT chest.  No acute infiltrate, pneumothorax, or acute osseous findings.  Probable chronic RIGHT rotator cuff  tear.  IMPRESSION: COPD changes with persistent RIGHT basilar effusion atelectasis.   Electronically Signed  By: Lavonia Dana M.D.  On: 02/14/2016 14:11  I personally reviewed the chest x-ray. Findings are consistent with recent right lower lobectomy.  Impression: 82 year old woman who is now about a month out from a right lower lobectomy for a carcinoid tumor and excision of a benign diaphragmatic cyst. Postoperative course was complicated by prolonged air leak.  She is doing well currently. She is not having any respiratory issues. Her incision is healing well. Her subcutaneous emphysema is resolving. She is not requiring any narcotics for pain control.  She may begin driving on a limited basis. Appropriate precautions were discussed. She should avoid heavy traffic and high speeds and limit herself to very short trips such as going to the grocery store.  Plan: I will see her back in 5 months. That will be 6 months from her surgery. We'll do a PA lateral chest x-ray at that time.  Katie Nakayama, MD [facility] [phone]

## 2016-04-13 ENCOUNTER — Encounter: Payer: Self-pay | Admitting: *Deleted

## 2016-06-01 ENCOUNTER — Emergency Department (HOSPITAL_COMMUNITY): Payer: Medicare Other

## 2016-06-01 ENCOUNTER — Inpatient Hospital Stay (HOSPITAL_COMMUNITY)
Admission: EM | Admit: 2016-06-01 | Discharge: 2016-06-06 | DRG: 394 | Disposition: A | Discharge status: Home / Self Care | Payer: Medicare Other | Attending: Internal Medicine | Admitting: Internal Medicine

## 2016-06-01 ENCOUNTER — Encounter (HOSPITAL_COMMUNITY): Payer: Self-pay | Admitting: *Deleted

## 2016-06-01 DIAGNOSIS — Z79899 Other long term (current) drug therapy: Secondary | ICD-10-CM

## 2016-06-01 DIAGNOSIS — I482 Chronic atrial fibrillation, unspecified: Secondary | ICD-10-CM | POA: Diagnosis present

## 2016-06-01 DIAGNOSIS — I509 Heart failure, unspecified: Secondary | ICD-10-CM | POA: Diagnosis present

## 2016-06-01 DIAGNOSIS — R1011 Right upper quadrant pain: Secondary | ICD-10-CM

## 2016-06-01 DIAGNOSIS — I110 Hypertensive heart disease with heart failure: Secondary | ICD-10-CM | POA: Diagnosis present

## 2016-06-01 DIAGNOSIS — Z9221 Personal history of antineoplastic chemotherapy: Secondary | ICD-10-CM

## 2016-06-01 DIAGNOSIS — Q430 Meckel's diverticulum (displaced) (hypertrophic): Secondary | ICD-10-CM

## 2016-06-01 DIAGNOSIS — E876 Hypokalemia: Secondary | ICD-10-CM | POA: Diagnosis present

## 2016-06-01 DIAGNOSIS — K631 Perforation of intestine (nontraumatic): Secondary | ICD-10-CM | POA: Diagnosis not present

## 2016-06-01 DIAGNOSIS — Z96651 Presence of right artificial knee joint: Secondary | ICD-10-CM | POA: Diagnosis present

## 2016-06-01 DIAGNOSIS — R109 Unspecified abdominal pain: Secondary | ICD-10-CM

## 2016-06-01 DIAGNOSIS — Z8249 Family history of ischemic heart disease and other diseases of the circulatory system: Secondary | ICD-10-CM

## 2016-06-01 DIAGNOSIS — Z9011 Acquired absence of right breast and nipple: Secondary | ICD-10-CM

## 2016-06-01 DIAGNOSIS — Z886 Allergy status to analgesic agent status: Secondary | ICD-10-CM

## 2016-06-01 DIAGNOSIS — E871 Hypo-osmolality and hyponatremia: Secondary | ICD-10-CM

## 2016-06-01 DIAGNOSIS — M810 Age-related osteoporosis without current pathological fracture: Secondary | ICD-10-CM | POA: Diagnosis present

## 2016-06-01 DIAGNOSIS — Z96643 Presence of artificial hip joint, bilateral: Secondary | ICD-10-CM | POA: Diagnosis present

## 2016-06-01 DIAGNOSIS — Z7901 Long term (current) use of anticoagulants: Secondary | ICD-10-CM

## 2016-06-01 DIAGNOSIS — Z853 Personal history of malignant neoplasm of breast: Secondary | ICD-10-CM

## 2016-06-01 DIAGNOSIS — K219 Gastro-esophageal reflux disease without esophagitis: Secondary | ICD-10-CM | POA: Diagnosis present

## 2016-06-01 DIAGNOSIS — R110 Nausea: Secondary | ICD-10-CM

## 2016-06-01 LAB — COMPREHENSIVE METABOLIC PANEL
ALT: 16 U/L (ref 14–54)
ANION GAP: 10 (ref 5–15)
AST: 29 U/L (ref 15–41)
Albumin: 3.9 g/dL (ref 3.5–5.0)
Alkaline Phosphatase: 64 U/L (ref 38–126)
BUN: 8 mg/dL (ref 6–20)
CHLORIDE: 87 mmol/L — AB (ref 101–111)
CO2: 23 mmol/L (ref 22–32)
Calcium: 8.7 mg/dL — ABNORMAL LOW (ref 8.9–10.3)
Creatinine, Ser: 0.61 mg/dL (ref 0.44–1.00)
Glucose, Bld: 109 mg/dL — ABNORMAL HIGH (ref 65–99)
POTASSIUM: 3.9 mmol/L (ref 3.5–5.1)
SODIUM: 120 mmol/L — AB (ref 135–145)
Total Bilirubin: 3.8 mg/dL — ABNORMAL HIGH (ref 0.3–1.2)
Total Protein: 7.2 g/dL (ref 6.5–8.1)

## 2016-06-01 LAB — CBC
HEMATOCRIT: 40.1 % (ref 36.0–46.0)
Hemoglobin: 13.4 g/dL (ref 12.0–15.0)
MCH: 30.8 pg (ref 26.0–34.0)
MCHC: 33.4 g/dL (ref 30.0–36.0)
MCV: 92.2 fL (ref 78.0–100.0)
Platelets: 163 10*3/uL (ref 150–400)
RBC: 4.35 MIL/uL (ref 3.87–5.11)
RDW: 14.1 % (ref 11.5–15.5)
WBC: 9.6 10*3/uL (ref 4.0–10.5)

## 2016-06-01 LAB — URINALYSIS, ROUTINE W REFLEX MICROSCOPIC
BILIRUBIN URINE: NEGATIVE
Glucose, UA: NEGATIVE mg/dL
Hgb urine dipstick: NEGATIVE
Ketones, ur: 15 mg/dL — AB
NITRITE: NEGATIVE
PROTEIN: 30 mg/dL — AB
SPECIFIC GRAVITY, URINE: 1.02 (ref 1.005–1.030)
pH: 6 (ref 5.0–8.0)

## 2016-06-01 LAB — LIPASE, BLOOD: LIPASE: 32 U/L (ref 11–51)

## 2016-06-01 LAB — URINE MICROSCOPIC-ADD ON: RBC / HPF: NONE SEEN RBC/hpf (ref 0–5)

## 2016-06-01 LAB — PROTIME-INR
INR: 1.33
Prothrombin Time: 16.5 seconds — ABNORMAL HIGH (ref 11.4–15.2)

## 2016-06-01 MED ORDER — SODIUM CHLORIDE 0.9 % IV BOLUS (SEPSIS)
250.0000 mL | Freq: Once | INTRAVENOUS | Status: AC
  Administered 2016-06-01: 250 mL via INTRAVENOUS

## 2016-06-01 MED ORDER — SODIUM CHLORIDE 0.9 % IV SOLN
INTRAVENOUS | Status: DC
  Administered 2016-06-01: 22:00:00 via INTRAVENOUS
  Administered 2016-06-02: 75 mL/h via INTRAVENOUS

## 2016-06-01 NOTE — ED Notes (Signed)
Pt transported to xray 

## 2016-06-01 NOTE — ED Triage Notes (Signed)
Pt sent here by her pcp for sodium of 120.  Initially was seen by pcp for abdominal pain.

## 2016-06-01 NOTE — ED Notes (Signed)
Admitting MD at bedside.

## 2016-06-01 NOTE — ED Notes (Signed)
Patient transported to X-ray 

## 2016-06-01 NOTE — ED Provider Notes (Signed)
[facility] DEPT Provider Note   CSN: [id number] Arrival date & time: 06/01/16  1627     History   Chief Complaint Chief Complaint  Patient presents with  . Abdominal Pain    low sodium    HPI [name] is a 82 y.o. female.  Patient followed by [facility] medical Dr. Blenda Bridegroom. Patient with onset of right-sided right upper quadrant abdominal pain on Wednesday morning. Had an office visit for this. They did lab work which revealed a sodium of 120. Patient referred in for that. Patient's abdominal pain is been constant since it started associated with nausea no vomiting no diarrhea no dysuria. 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 Patient has a history of CHF history of atrial fibrillation and is on Coumadin. Also of significance from the labs that were taken this morning patient's bilirubin was elevated. Rest of LFTs without any significant abnormalities.      Past Medical History:  Diagnosis Date  . Arthritis 1996  . Atrial fibrillation (HCC)    on coumadin  . Atrial tachycardia ([place])   . Cancer [facility]) 1988   breast cancer, R mastectomy, chemo  . 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 Diverticulitis 1999  . Femur fracture ([place]) 03/2013   left, non weight bearing for 2 1/2 weeks  . GERD (gastroesophageal reflux disease)   . Hemorrhoids 1991  . HTN (hypertension)   . Lymphedema of upper extremity    right arm  . Osteoporosis   . PONV (postoperative nausea and vomiting)   . Urinary, incontinence, stress female   . Vitamin D deficiency disease     Patient Active Problem List   Diagnosis Date Noted  . Sinus pause 01/17/2016  . Essential hypertension   . S/P lobectomy of lung 01/05/2016  . 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 Long term (current) use of anticoagulants 05/09/2013  . Osteoporosis 05/09/2013  . Constipation 05/08/2013  . Hip fracture, left  ([place]) 04/19/2013  . Hyponatremia 04/19/2013  . Atrial fibrillation, chronic ([place]) 04/19/2013  . Lung nodule 04/19/2013  . Cellulitis of arm, right 06/05/2011  . HYPERTENSION 01/13/2007  . OSTEOARTHRITIS 01/13/2007  . OSTEOPENIA 01/13/2007    Past Surgical History:  Procedure Laterality Date  . APPENDECTOMY    . BREAST BIOPSY  05/04/1987   Right Dr Annamaria Boots  . BREAST IMPLANT REMOVAL Right 06/05/10  . BUNIONECTOMY  1988  . carcinoid Right    Right lower lobectomy 2017  . COLONOSCOPY W/ POLYPECTOMY    . EYE SURGERY  5/14 ; 6/14   Cataract Surgery  . JOINT REPLACEMENT     2002 left; 2009 right Hip, rt knee 2012  . KNEE ARTHROPLASTY Right 2012   [facility]  . LOBECTOMY Right 01/05/2016   Procedure: RIGHT LOWER LOBE LOBECTOMY;  Surgeon: Melrose Nakayama, MD;  Location: [facility];  Service: Thoracic;  Laterality: Right;  . MASTECTOMY PARTIAL / LUMPECTOMY W/ AXILLARY LYMPHADENECTOMY  04/05/1987   Right - Dr Annamaria Boots  . RESECTION OF MEDIASTINAL MASS N/A 01/05/2016   Procedure: RESECTION OF PLEURAL MASS, right;  Surgeon: Melrose Nakayama, MD;  Location: [facility] [place];  Service: Thoracic;  Laterality: N/A;  . TONSILLECTOMY    . TOTAL HIP ARTHROPLASTY    . TUBAL LIGATION Bilateral 1970  . VAGINAL HYSTERECTOMY  1977   [place]  . VIDEO ASSISTED THORACOSCOPY (VATS)/WEDGE RESECTION Right 01/05/2016   Procedure:  RIGHT VIDEO ASSISTED THORACOSCOPY (VATS)/WEDGE RESECTION;  Surgeon: Melrose Nakayama, MD;  Location: [facility] OR;  Service: Thoracic;  Laterality: Right;    OB History    Gravida Para Term Preterm AB Living   '5 3 3 '$ 0 2 3   SAB TAB Ectopic Multiple Live Births   2 0 0 0 3       Home Medications    Prior to Admission medications   Medication Sig Start Date End Date Taking? Authorizing Provider  acetaminophen (TYLENOL) 500 MG tablet Take 2 tablets (1,000 mg total) by mouth every 6 (six) hours as needed for mild pain. 01/17/16  Yes Donielle Liston Alba, PA-C  amLODipine (NORVASC) 5 MG tablet  Take 5 mg by mouth daily.   Yes Historical Provider, MD  apixaban (ELIQUIS) 5 MG TABS tablet Take 5 mg by mouth 2 (two) times daily.   Yes Historical Provider, MD  Calcium Carbonate-Vitamin D (CALTRATE 600+D) 600-400 MG-UNIT per tablet Take 1 tablet by mouth daily.   Yes Historical Provider, MD  carboxymethylcellulose (REFRESH PLUS) 0.5 % SOLN Place 1 drop into both eyes 2 (two) times daily as needed (for dry eyes).   Yes Historical Provider, MD  carvedilol (COREG) 12.5 MG tablet Take 12.5 mg by mouth 2 (two) times daily with a meal.   Yes Historical Provider, MD  Ergocalciferol (VITAMIN D2) 400 units TABS Take 400 Units by mouth daily.   Yes Historical Provider, MD  Estradiol (VAGIFEM) 10 MCG TABS vaginal tablet Place 1 tablet (10 mcg total) vaginally 2 (two) times a week. 07/04/15  Yes Kem Boroughs, FNP  Multiple Vitamin (MULTIVITAMIN WITH MINERALS) TABS Take 1 tablet by mouth every evening.   Yes Historical Provider, MD  Omega-3 Fatty Acids (FISH OIL) 1000 MG CAPS Take 1 capsule by mouth 2 (two) times daily.   Yes Historical Provider, MD  telmisartan (MICARDIS) 80 MG tablet Take 80 mg by mouth daily.   Yes Historical Provider, MD  carvedilol (COREG) 6.25 MG tablet Take 1 tablet (6.25 mg total) by mouth 2 (two) times daily with a meal. Patient not taking: Reported on 06/01/2016 01/17/16   John Giovanni, PA-C    Family History Family History  Problem Relation Age of Onset  . Colon cancer Mother   . Colon cancer Father   . Heart disease Father     Social History Social History  Substance Use Topics  . Smoking status: Never Smoker  . Smokeless tobacco: Never Used  . Alcohol use No     Allergies   Aspirin   Review of Systems Review of Systems  Constitutional: Negative for fever.  HENT: Negative for congestion.   Eyes: Negative for visual disturbance.  Respiratory: Negative for shortness of breath.   Cardiovascular: Negative for chest pain, palpitations and leg swelling.    Gastrointestinal: Positive for abdominal pain and nausea. Negative for diarrhea and vomiting.  Genitourinary: Negative for dysuria.  Musculoskeletal: Negative for back pain.  Skin: Negative for rash.  Neurological: Negative for headaches.  Hematological: Bruises/bleeds easily.  Psychiatric/Behavioral: Negative for confusion.     Physical Exam Updated Vital Signs BP 143/78   Pulse 74   Temp 98.1 F (36.7 C) (Oral)   Resp 21   Ht 5' 1.25" (1.556 m)   Wt 63 kg   LMP 10/16/1975 (Approximate)   SpO2 98%   BMI 26.05 kg/m   Physical Exam  Constitutional: She is oriented to person, place, and time. She appears well-developed and well-nourished.  HENT:  Head: Normocephalic and atraumatic.  Mouth/Throat: Oropharynx is clear and moist.  Eyes: EOM are normal. Pupils are equal, round, and reactive to light.  Neck: Normal range of motion. Neck supple.  Cardiovascular: Normal rate.   Irregular rhythm  Pulmonary/Chest: Effort normal and breath sounds normal. No respiratory distress.  Abdominal: Soft. Bowel sounds are normal. She exhibits no distension. There is tenderness. There is no guarding.  Mild tenderness right upper quadrant no guarding  Musculoskeletal: Normal range of motion.  Neurological: She is alert and oriented to person, place, and time. No cranial nerve deficit. She exhibits normal muscle tone. Coordination normal.  Skin: Skin is warm.  Nursing note and vitals reviewed.    ED Treatments / Results  Labs (all labs ordered are listed, but only abnormal results are displayed) Labs Reviewed  COMPREHENSIVE METABOLIC PANEL - Abnormal; Notable for the following:       Result Value   Sodium 120 (*)    Chloride 87 (*)    Glucose, Bld 109 (*)    Calcium 8.7 (*)    Total Bilirubin 3.8 (*)    All other components within normal limits  PROTIME-INR - Abnormal; Notable for the following:    Prothrombin Time 16.5 (*)    All other components within normal limits  URINALYSIS,  ROUTINE W REFLEX MICROSCOPIC (NOT AT [facility]) - Abnormal; Notable for the following:    Color, Urine AMBER (*)    Ketones, ur 15 (*)    Protein, ur 30 (*)    Leukocytes, UA SMALL (*)    All other components within normal limits  URINE MICROSCOPIC-ADD ON - Abnormal; Notable for the following:    Squamous Epithelial / LPF 0-5 (*)    Bacteria, UA RARE (*)    All other components within normal limits  LIPASE, BLOOD  CBC   Results for orders placed or performed during the hospital encounter of 06/01/16  Lipase, blood  Result Value Ref Range   Lipase 32 11 - 51 U/L  Comprehensive metabolic panel  Result Value Ref Range   Sodium 120 (L) 135 - 145 mmol/L   Potassium 3.9 3.5 - 5.1 mmol/L   Chloride 87 (L) 101 - 111 mmol/L   CO2 23 22 - 32 mmol/L   Glucose, Bld 109 (H) 65 - 99 mg/dL   BUN 8 6 - 20 mg/dL   Creatinine, Ser 0.61 0.44 - 1.00 mg/dL   Calcium 8.7 (L) 8.9 - 10.3 mg/dL   Total Protein 7.2 6.5 - 8.1 g/dL   Albumin 3.9 3.5 - 5.0 g/dL   AST 29 15 - 41 U/L   ALT 16 14 - 54 U/L   Alkaline Phosphatase 64 38 - 126 U/L   Total Bilirubin 3.8 (H) 0.3 - 1.2 mg/dL   GFR calc non Af Amer >60 >60 mL/min   GFR calc Af Amer >60 >60 mL/min   Anion gap 10 5 - 15  CBC  Result Value Ref Range   WBC 9.6 4.0 - 10.5 K/uL   RBC 4.35 3.87 - 5.11 MIL/uL   Hemoglobin 13.4 12.0 - 15.0 g/dL   HCT 40.1 36.0 - 46.0 %   MCV 92.2 78.0 - 100.0 fL   MCH 30.8 26.0 - 34.0 pg   MCHC 33.4 30.0 - 36.0 g/dL   RDW 14.1 11.5 - 15.5 %   Platelets 163 150 - 400 K/uL  Protime-INR  Result Value Ref Range   Prothrombin Time 16.5 (H) 11.4 - 15.2 seconds  INR 1.33   Urinalysis, Routine w reflex microscopic (not at [facility])  Result Value Ref Range   Color, Urine AMBER (A) YELLOW   APPearance CLEAR CLEAR   Specific Gravity, Urine 1.020 1.005 - 1.030   pH 6.0 5.0 - 8.0   Glucose, UA NEGATIVE NEGATIVE mg/dL   Hgb urine dipstick NEGATIVE NEGATIVE   Bilirubin Urine NEGATIVE NEGATIVE   Ketones, ur 15 (A) NEGATIVE mg/dL    Protein, ur 30 (A) NEGATIVE mg/dL   Nitrite NEGATIVE NEGATIVE   Leukocytes, UA SMALL (A) NEGATIVE  Urine microscopic-add on  Result Value Ref Range   Squamous Epithelial / LPF 0-5 (A) NONE SEEN   WBC, UA 6-30 0 - 5 WBC/hpf   RBC / HPF NONE SEEN 0 - 5 RBC/hpf   Bacteria, UA RARE (A) NONE SEEN    EKG  EKG Interpretation  Date/Time:  Friday June 01 2016 21:29:42 EDT Ventricular Rate:  74 PR Interval:    QRS Duration: 92 QT Interval:  402 QTC Calculation: 446 R Axis:     Text Interpretation:  Age not entered, assumed to be  82 years old for purpose of ECG interpretation Atrial fibrillation RSR' in V1 or V2, probably normal variant No significant change since last tracing Confirmed by Rilyn Scroggs  MD, Khristie Sak [phone]) on 06/01/2016 9:37:07 PM       Radiology Dg Chest 2 View  Result Date: 06/01/2016 CLINICAL DATA:  Right upper abdominal pain EXAM: CHEST  2 VIEW COMPARISON:  02/14/2016 FINDINGS: Cardiomediastinal silhouette is stable. Again noted small right pleural effusion with right basilar atelectasis. Surgical clips in right axilla again noted. No infiltrate or pulmonary edema. Hyperinflation again noted. IMPRESSION: No active cardiopulmonary disease. Again noted hyperinflation. Stable small right pleural effusion with right basilar atelectasis. Electronically Signed   By: Lahoma Crocker M.D.   On: 06/01/2016 22:08   US Abdomen Complete  Result Date: 06/01/2016 CLINICAL DATA:  Initial evaluation for acute right upper quadrant abdominal pain. EXAM: ABDOMEN ULTRASOUND COMPLETE COMPARISON:  Prior CT from 11/04/2015. FINDINGS: Gallbladder: No gallstones or wall thickening visualized. No sonographic Murphy sign noted by sonographer. Common bile duct: Diameter: 4.5 mm Liver: Multiple small cystic lesions noted within liver, grossly similar to previous CT. Largest of these was positioned in the right lobe it measured 2.4 x 1.7 x 1.9 cm Liver demonstrates a nodular contour, suggesting cirrhosis.  IVC: No abnormality visualized. Pancreas: Visualized portion unremarkable. Spleen: Size and appearance within normal limits. Right Kidney: Length: 11.3 cm. Increased echogenicity. No hydronephrosis. 0.8 x 0.6 x 0.7 cyst noted. Left Kidney: Length: 11.4 cm. Increased echogenicity. No mass or hydronephrosis visualized. Abdominal aorta: No aneurysm visualized. Atherosclerotic calcifications noted. Other findings: Right pleural effusion. IMPRESSION: 1. Normal sonographic evaluation of the gallbladder. 2. Nodular contour of the liver, suggestive of cirrhosis. Several scattered cystic lesions within the liver, grossly similar relative to prior CT. 3. Increased echogenicity within the kidneys, compatible with medical renal disease. No hydronephrosis. 4. Right pleural effusion. Electronically Signed   By: Jeannine Boga M.D.   On: 06/01/2016 23:13    Procedures Procedures (including critical care time)  Medications Ordered in ED Medications  0.9 %  sodium chloride infusion ( Intravenous New Bag/Given 06/01/16 2145)  sodium chloride 0.9 % bolus 250 mL (250 mLs Intravenous New Bag/Given 06/01/16 2144)     Initial Impression / Assessment and Plan / ED Course  I have reviewed the triage vital signs and the nursing notes.  Pertinent labs & imaging results that  were available during my care of the patient were reviewed by me and considered in my medical decision making (see chart for details).  Clinical Course   Patient followed by [facility] medical. Primary care doctor Dr. Blenda Bridegroom. Patient with complaint of some right-sided abdominal pain predominately right upper quadrant since Wednesday morning. They did some lab work when they saw her in the office. Labs came back with a sodium of 120. Patient was referred in for admission for the hyponatremia. Patient symptoms include the right upper quadrant pain some nausea no vomiting no diarrhea no dysuria. Patient's past medical history significant for right lung  removal for carcinoid bronchial adenoma.  Patient's repeat labs here were consistent was done in the office. Sodium is still 120. Blood sugar lites otherwise without sniffing and maladies. Patient workup for the right upper quadrant pain included an ultrasound without any abnormalities. Lipase is normal no evidence of pancreatitis. Bilirubin is elevated higher than usual for her in the range of 3. Alkaline phosphatase is normal. LFTs otherwise normal. No leukocytosis. No fever. No significant guarding to the abdomen. Do not think this is an acute surgical abdomen vertically with symptoms being present since Wednesday morning. Would expect more significant symptoms. Patient has had her appendix removed.  For the hyponatremia patient started with IV treatment with normal saline at 75 mL an hour. Patient does have a history of congestive heart failure. Chest x-ray without evidence of any pulmonary edema. But chest x-ray does show evidence of a small pleural effusion on the right side. Patient has a history of atrial fibrillation. EKG confirms that. Patient is on Coumadin. Patient subtherapeutic on Coumadin.   Final Clinical Impressions(s) / ED Diagnoses   Final diagnoses:  Right upper quadrant pain  Hyponatremia    New Prescriptions New Prescriptions   No medications on file     Fredia Sorrow, MD 06/01/16 2357

## 2016-06-02 ENCOUNTER — Encounter (HOSPITAL_COMMUNITY): Payer: Self-pay | Admitting: Radiology

## 2016-06-02 ENCOUNTER — Observation Stay (HOSPITAL_COMMUNITY): Payer: Medicare Other

## 2016-06-02 DIAGNOSIS — Z853 Personal history of malignant neoplasm of breast: Secondary | ICD-10-CM | POA: Diagnosis not present

## 2016-06-02 DIAGNOSIS — K219 Gastro-esophageal reflux disease without esophagitis: Secondary | ICD-10-CM | POA: Diagnosis present

## 2016-06-02 DIAGNOSIS — Z9221 Personal history of antineoplastic chemotherapy: Secondary | ICD-10-CM | POA: Diagnosis not present

## 2016-06-02 DIAGNOSIS — Q430 Meckel's diverticulum (displaced) (hypertrophic): Secondary | ICD-10-CM | POA: Diagnosis not present

## 2016-06-02 DIAGNOSIS — R110 Nausea: Secondary | ICD-10-CM

## 2016-06-02 DIAGNOSIS — R109 Unspecified abdominal pain: Secondary | ICD-10-CM

## 2016-06-02 DIAGNOSIS — M810 Age-related osteoporosis without current pathological fracture: Secondary | ICD-10-CM | POA: Diagnosis present

## 2016-06-02 DIAGNOSIS — R1011 Right upper quadrant pain: Secondary | ICD-10-CM | POA: Diagnosis not present

## 2016-06-02 DIAGNOSIS — Z96651 Presence of right artificial knee joint: Secondary | ICD-10-CM | POA: Diagnosis present

## 2016-06-02 DIAGNOSIS — Z8249 Family history of ischemic heart disease and other diseases of the circulatory system: Secondary | ICD-10-CM | POA: Diagnosis not present

## 2016-06-02 DIAGNOSIS — I509 Heart failure, unspecified: Secondary | ICD-10-CM | POA: Diagnosis present

## 2016-06-02 DIAGNOSIS — E876 Hypokalemia: Secondary | ICD-10-CM | POA: Diagnosis present

## 2016-06-02 DIAGNOSIS — Z9011 Acquired absence of right breast and nipple: Secondary | ICD-10-CM | POA: Diagnosis not present

## 2016-06-02 DIAGNOSIS — Z96643 Presence of artificial hip joint, bilateral: Secondary | ICD-10-CM | POA: Diagnosis present

## 2016-06-02 DIAGNOSIS — I482 Chronic atrial fibrillation: Secondary | ICD-10-CM | POA: Diagnosis present

## 2016-06-02 DIAGNOSIS — E871 Hypo-osmolality and hyponatremia: Secondary | ICD-10-CM | POA: Diagnosis present

## 2016-06-02 DIAGNOSIS — I10 Essential (primary) hypertension: Secondary | ICD-10-CM | POA: Diagnosis not present

## 2016-06-02 DIAGNOSIS — K631 Perforation of intestine (nontraumatic): Secondary | ICD-10-CM | POA: Diagnosis present

## 2016-06-02 DIAGNOSIS — Z79899 Other long term (current) drug therapy: Secondary | ICD-10-CM | POA: Diagnosis not present

## 2016-06-02 DIAGNOSIS — I110 Hypertensive heart disease with heart failure: Secondary | ICD-10-CM | POA: Diagnosis present

## 2016-06-02 DIAGNOSIS — Z7901 Long term (current) use of anticoagulants: Secondary | ICD-10-CM | POA: Diagnosis not present

## 2016-06-02 DIAGNOSIS — Z886 Allergy status to analgesic agent status: Secondary | ICD-10-CM | POA: Diagnosis not present

## 2016-06-02 LAB — APTT
APTT: 35 s (ref 24–36)
APTT: 50 s — AB (ref 24–36)
aPTT: 79 seconds — ABNORMAL HIGH (ref 24–36)

## 2016-06-02 LAB — HEPATIC FUNCTION PANEL
ALT: 16 U/L (ref 14–54)
AST: 22 U/L (ref 15–41)
Albumin: 3.4 g/dL — ABNORMAL LOW (ref 3.5–5.0)
Alkaline Phosphatase: 60 U/L (ref 38–126)
BILIRUBIN DIRECT: 0.4 mg/dL (ref 0.1–0.5)
BILIRUBIN INDIRECT: 2.3 mg/dL — AB (ref 0.3–0.9)
BILIRUBIN TOTAL: 2.7 mg/dL — AB (ref 0.3–1.2)
Total Protein: 6.2 g/dL — ABNORMAL LOW (ref 6.5–8.1)

## 2016-06-02 LAB — OSMOLALITY: Osmolality: 253 mOsm/kg — ABNORMAL LOW (ref 275–295)

## 2016-06-02 LAB — CBC
HEMATOCRIT: 38 % (ref 36.0–46.0)
Hemoglobin: 12.5 g/dL (ref 12.0–15.0)
MCH: 30.4 pg (ref 26.0–34.0)
MCHC: 32.9 g/dL (ref 30.0–36.0)
MCV: 92.5 fL (ref 78.0–100.0)
Platelets: 137 10*3/uL — ABNORMAL LOW (ref 150–400)
RBC: 4.11 MIL/uL (ref 3.87–5.11)
RDW: 14.1 % (ref 11.5–15.5)
WBC: 7.4 10*3/uL (ref 4.0–10.5)

## 2016-06-02 LAB — BASIC METABOLIC PANEL
Anion gap: 10 (ref 5–15)
Anion gap: 10 (ref 5–15)
BUN: 6 mg/dL (ref 6–20)
BUN: 5 mg/dL — AB (ref 6–20)
CO2: 22 mmol/L (ref 22–32)
CO2: 25 mmol/L (ref 22–32)
CREATININE: 0.56 mg/dL (ref 0.44–1.00)
CREATININE: 0.64 mg/dL (ref 0.44–1.00)
Calcium: 8.4 mg/dL — ABNORMAL LOW (ref 8.9–10.3)
Calcium: 8.7 mg/dL — ABNORMAL LOW (ref 8.9–10.3)
Chloride: 90 mmol/L — ABNORMAL LOW (ref 101–111)
Chloride: 90 mmol/L — ABNORMAL LOW (ref 101–111)
Glucose, Bld: 89 mg/dL (ref 65–99)
Glucose, Bld: 98 mg/dL (ref 65–99)
POTASSIUM: 3.7 mmol/L (ref 3.5–5.1)
Potassium: 3.6 mmol/L (ref 3.5–5.1)
SODIUM: 122 mmol/L — AB (ref 135–145)
SODIUM: 125 mmol/L — AB (ref 135–145)

## 2016-06-02 LAB — HEPARIN LEVEL (UNFRACTIONATED)
HEPARIN UNFRACTIONATED: 1.82 [IU]/mL — AB (ref 0.30–0.70)
HEPARIN UNFRACTIONATED: 0.94 [IU]/mL — AB (ref 0.30–0.70)
HEPARIN UNFRACTIONATED: 0.92 [IU]/mL — AB (ref 0.30–0.70)

## 2016-06-02 LAB — OSMOLALITY, URINE: Osmolality, Ur: 211 mOsm/kg — ABNORMAL LOW (ref 300–900)

## 2016-06-02 LAB — TSH: TSH: 1.404 u[IU]/mL (ref 0.350–4.500)

## 2016-06-02 LAB — SODIUM, URINE, RANDOM: SODIUM UR: 20 mmol/L

## 2016-06-02 MED ORDER — APIXABAN 5 MG PO TABS: 5.0000 mg | ORAL_TABLET | Freq: Two times a day (BID) | ORAL | Status: DC

## 2016-06-02 MED ORDER — IOPAMIDOL (ISOVUE-300) INJECTION 61%
INTRAVENOUS | Status: AC
  Administered 2016-06-02: 100 mL
  Filled 2016-06-02: qty 100

## 2016-06-02 MED ORDER — ACETAMINOPHEN 650 MG RE SUPP: 650.0000 mg | Freq: Four times a day (QID) | RECTAL | Status: DC | PRN

## 2016-06-02 MED ORDER — HYPROMELLOSE (GONIOSCOPIC) 2.5 % OP SOLN
1.0000 [drp] | Freq: Two times a day (BID) | OPHTHALMIC | Status: DC | PRN
  Filled 2016-06-02: qty 15

## 2016-06-02 MED ORDER — ONDANSETRON HCL 4 MG PO TABS: 4.0000 mg | ORAL_TABLET | Freq: Four times a day (QID) | ORAL | Status: DC | PRN

## 2016-06-02 MED ORDER — HYDRALAZINE HCL 20 MG/ML IJ SOLN: 20.0000 mg | Freq: Four times a day (QID) | INTRAMUSCULAR | Status: DC | PRN

## 2016-06-02 MED ORDER — DILTIAZEM HCL 25 MG/5ML IV SOLN
10.0000 mg | INTRAVENOUS | Status: DC | PRN
  Filled 2016-06-02: qty 5

## 2016-06-02 MED ORDER — PIPERACILLIN-TAZOBACTAM 3.375 G IVPB
3.3750 g | Freq: Three times a day (TID) | INTRAVENOUS | Status: DC
  Administered 2016-06-02 – 2016-06-04 (×6): 3.375 g via INTRAVENOUS
  Filled 2016-06-02 (×10): qty 50

## 2016-06-02 MED ORDER — ONDANSETRON HCL 4 MG/2ML IJ SOLN: 4.0000 mg | Freq: Four times a day (QID) | INTRAMUSCULAR | Status: DC | PRN

## 2016-06-02 MED ORDER — ACETAMINOPHEN 325 MG PO TABS: 650.0000 mg | ORAL_TABLET | Freq: Four times a day (QID) | ORAL | Status: DC | PRN

## 2016-06-02 MED ORDER — CARVEDILOL 12.5 MG PO TABS: 12.5000 mg | ORAL_TABLET | Freq: Two times a day (BID) | ORAL | Status: DC

## 2016-06-02 MED ORDER — SODIUM CHLORIDE 0.9% FLUSH
3.0000 mL | Freq: Two times a day (BID) | INTRAVENOUS | Status: DC
  Administered 2016-06-02 – 2016-06-06 (×3): 3 mL via INTRAVENOUS

## 2016-06-02 MED ORDER — AMLODIPINE BESYLATE 5 MG PO TABS: 5.0000 mg | ORAL_TABLET | Freq: Every day | ORAL | Status: DC

## 2016-06-02 MED ORDER — HEPARIN (PORCINE) IN NACL 100-0.45 UNIT/ML-% IJ SOLN
1050.0000 [IU]/h | INTRAMUSCULAR | Status: DC
  Administered 2016-06-02: 900 [IU]/h via INTRAVENOUS
  Administered 2016-06-03: 1050 [IU]/h via INTRAVENOUS
  Filled 2016-06-02 (×2): qty 250

## 2016-06-02 MED ORDER — SODIUM CHLORIDE 0.9 % IV SOLN
INTRAVENOUS | Status: AC
  Administered 2016-06-01: 23:00:00 via INTRAVENOUS

## 2016-06-02 NOTE — Consult Note (Signed)
Reason for Consult:Perforated Bowel Referring Physician: Dr. Lily Kocher  [name] is an 82 y.o. female.  HPI: The patient is a 82 year old female with a three-day history of abdominal pain. Patient states that she continued to be sore and went to her PCP. He recommended ER workup. Upon evaluation ER patient was seen to be hyponatremic. Patient also underwent CT scan secondary to continued abdominal soreness. 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 Surgery was consult for further evaluation.  Patient has had a previous tubal ligation as well as appendectomy in the remote past.  Of note patient is currently on Elliquis was secondary to A. fib. She most recently underwent a right lower lobectomy in diaphragm cyst removal by Dr. Roxan Hockey in March 2017.  Past Medical History:  Diagnosis Date  . Arthritis 1996  . Atrial fibrillation (HCC)    on coumadin  . Atrial tachycardia ([place])   . Cancer [facility]) 1988   breast cancer, R mastectomy, chemo  . 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 Diverticulitis 1999  . Femur fracture ([place]) 03/2013   left, non weight bearing for 2 1/2 weeks  . GERD (gastroesophageal reflux disease)   . Hemorrhoids 1991  . HTN (hypertension)   . Lymphedema of upper extremity    right arm  . Osteoporosis   . PONV (postoperative nausea and vomiting)   . Urinary, incontinence, stress female   . Vitamin D deficiency disease     Past Surgical History:  Procedure Laterality Date  . APPENDECTOMY    . BREAST BIOPSY  05/04/1987   Right Dr Annamaria Boots  . BREAST IMPLANT REMOVAL Right 06/05/10  . BUNIONECTOMY  1988  . carcinoid Right    Right lower lobectomy 2017  . COLONOSCOPY W/ POLYPECTOMY    . EYE SURGERY  5/14 ; 6/14   Cataract Surgery  . JOINT REPLACEMENT     2002 left; 2009 right Hip, rt knee 2012  . KNEE ARTHROPLASTY Right 2012   [facility]  . LOBECTOMY  Right 01/05/2016   Procedure: RIGHT LOWER LOBE LOBECTOMY;  Surgeon: Melrose Nakayama, MD;  Location: [place];  Service: Thoracic;  Laterality: Right;  . MASTECTOMY PARTIAL / LUMPECTOMY W/ AXILLARY LYMPHADENECTOMY  04/05/1987   Right - Dr Annamaria Boots  . RESECTION OF MEDIASTINAL MASS N/A 01/05/2016   Procedure: RESECTION OF PLEURAL MASS, right;  Surgeon: Melrose Nakayama, MD;  Location: [place];  Service: Thoracic;  Laterality: N/A;  . TONSILLECTOMY    . TOTAL HIP ARTHROPLASTY    . TUBAL LIGATION Bilateral 1970  . VAGINAL HYSTERECTOMY  1977   myomata  . VIDEO ASSISTED THORACOSCOPY (VATS)/WEDGE RESECTION Right 01/05/2016   Procedure: RIGHT VIDEO ASSISTED THORACOSCOPY (VATS)/WEDGE RESECTION;  Surgeon: Melrose Nakayama, MD;  Location: [place];  Service: Thoracic;  Laterality: Right;    Family History  Problem Relation Age of Onset  . Colon cancer Mother   . Colon cancer Father   . Heart disease Father     Social History:  reports that she has never smoked. She has never used smokeless tobacco. She reports that she does not drink alcohol or use drugs.  Allergies:  Allergies  Allergen Reactions  . Aspirin Rash    Medications: I have reviewed the patient's current medications.  Results for orders placed or performed during the hospital encounter of 06/01/16 (from the past 48 hour(s))  Lipase, blood  Status: None   Collection Time: 06/01/16  9:33 PM  Result Value Ref Range   Lipase 32 11 - 51 U/L  Comprehensive metabolic panel     Status: Abnormal   Collection Time: 06/01/16  9:33 PM  Result Value Ref Range   Sodium 120 (L) 135 - 145 mmol/L   Potassium 3.9 3.5 - 5.1 mmol/L   Chloride 87 (L) 101 - 111 mmol/L   CO2 23 22 - 32 mmol/L   Glucose, Bld 109 (H) 65 - 99 mg/dL   BUN 8 6 - 20 mg/dL   Creatinine, Ser 0.61 0.44 - 1.00 mg/dL   Calcium 8.7 (L) 8.9 - 10.3 mg/dL   Total Protein 7.2 6.5 - 8.1 g/dL   Albumin 3.9 3.5 - 5.0 g/dL   AST 29 15 - 41 U/L   ALT 16 14 - 54 U/L    Alkaline Phosphatase 64 38 - 126 U/L   Total Bilirubin 3.8 (H) 0.3 - 1.2 mg/dL   GFR calc non Af Amer >60 >60 mL/min   GFR calc Af Amer >60 >60 mL/min    Comment: (NOTE) The eGFR has been calculated using the CKD EPI equation. This calculation has not been validated in all clinical situations. eGFR's persistently <60 mL/min signify possible Chronic Kidney Disease.    Anion gap 10 5 - 15  CBC     Status: None   Collection Time: 06/01/16  9:33 PM  Result Value Ref Range   WBC 9.6 4.0 - 10.5 K/uL   RBC 4.35 3.87 - 5.11 MIL/uL   Hemoglobin 13.4 12.0 - 15.0 g/dL   HCT 40.1 36.0 - 46.0 %   MCV 92.2 78.0 - 100.0 fL   MCH 30.8 26.0 - 34.0 pg   MCHC 33.4 30.0 - 36.0 g/dL   RDW 14.1 11.5 - 15.5 %   Platelets 163 150 - 400 K/uL  Protime-INR     Status: Abnormal   Collection Time: 06/01/16  9:33 PM  Result Value Ref Range   Prothrombin Time 16.5 (H) 11.4 - 15.2 seconds   INR 1.33   Urinalysis, Routine w reflex microscopic (not at [facility])     Status: Abnormal   Collection Time: 06/01/16  9:33 PM  Result Value Ref Range   Color, Urine AMBER (A) YELLOW    Comment: BIOCHEMICALS MAY BE AFFECTED BY COLOR   APPearance CLEAR CLEAR   Specific Gravity, Urine 1.020 1.005 - 1.030   pH 6.0 5.0 - 8.0   Glucose, UA NEGATIVE NEGATIVE mg/dL   Hgb urine dipstick NEGATIVE NEGATIVE   Bilirubin Urine NEGATIVE NEGATIVE   Ketones, ur 15 (A) NEGATIVE mg/dL   Protein, ur 30 (A) NEGATIVE mg/dL   Nitrite NEGATIVE NEGATIVE   Leukocytes, UA SMALL (A) NEGATIVE  Urine microscopic-add on     Status: Abnormal   Collection Time: 06/01/16  9:33 PM  Result Value Ref Range   Squamous Epithelial / LPF 0-5 (A) NONE SEEN   WBC, UA 6-30 0 - 5 WBC/hpf   RBC / HPF NONE SEEN 0 - 5 RBC/hpf   Bacteria, UA RARE (A) NONE SEEN  Basic metabolic panel     Status: Abnormal   Collection Time: 06/02/16  2:40 AM  Result Value Ref Range   Sodium 122 (L) 135 - 145 mmol/L   Potassium 3.7 3.5 - 5.1 mmol/L   Chloride 90 (L) 101 - 111  mmol/L   CO2 22 22 - 32 mmol/L   Glucose, Bld 89 65 -  99 mg/dL   BUN 6 6 - 20 mg/dL   Creatinine, Ser 0.56 0.44 - 1.00 mg/dL   Calcium 8.4 (L) 8.9 - 10.3 mg/dL   GFR calc non Af Amer >60 >60 mL/min   GFR calc Af Amer >60 >60 mL/min    Comment: (NOTE) The eGFR has been calculated using the CKD EPI equation. This calculation has not been validated in all clinical situations. eGFR's persistently <60 mL/min signify possible Chronic Kidney Disease.    Anion gap 10 5 - 15  Osmolality     Status: Abnormal   Collection Time: 06/02/16  2:40 AM  Result Value Ref Range   Osmolality 253 (L) 275 - 295 mOsm/kg  TSH     Status: None   Collection Time: 06/02/16  2:40 AM  Result Value Ref Range   TSH 1.404 0.350 - 4.500 uIU/mL  APTT     Status: None   Collection Time: 06/02/16  2:40 AM  Result Value Ref Range   aPTT 35 24 - 36 seconds  Heparin level (unfractionated)     Status: Abnormal   Collection Time: 06/02/16  2:40 AM  Result Value Ref Range   Heparin Unfractionated 1.82 (H) 0.30 - 0.70 IU/mL    Comment:        IF HEPARIN RESULTS ARE BELOW EXPECTED VALUES, AND PATIENT DOSAGE HAS BEEN CONFIRMED, SUGGEST FOLLOW UP TESTING OF ANTITHROMBIN III LEVELS. RESULTS CONFIRMED BY MANUAL DILUTION   Sodium, urine, random     Status: None   Collection Time: 06/02/16  4:02 AM  Result Value Ref Range   Sodium, Ur 20 mmol/L  Osmolality, urine     Status: Abnormal   Collection Time: 06/02/16  4:03 AM  Result Value Ref Range   Osmolality, Ur 211 (L) 300 - 900 mOsm/kg    Dg Chest 2 View  Result Date: 06/01/2016 CLINICAL DATA:  Right upper abdominal pain EXAM: CHEST  2 VIEW COMPARISON:  02/14/2016 FINDINGS: Cardiomediastinal silhouette is stable. Again noted small right pleural effusion with right basilar atelectasis. Surgical clips in right axilla again noted. No infiltrate or pulmonary edema. Hyperinflation again noted. IMPRESSION: No active cardiopulmonary disease. Again noted hyperinflation.  Stable small right pleural effusion with right basilar atelectasis. Electronically Signed   By: Lahoma Crocker M.D.   On: 06/01/2016 22:08   US Abdomen Complete  Result Date: 06/01/2016 CLINICAL DATA:  Initial evaluation for acute right upper quadrant abdominal pain. EXAM: ABDOMEN ULTRASOUND COMPLETE COMPARISON:  Prior CT from 11/04/2015. FINDINGS: Gallbladder: No gallstones or wall thickening visualized. No sonographic Murphy sign noted by sonographer. Common bile duct: Diameter: 4.5 mm Liver: Multiple small cystic lesions noted within liver, grossly similar to previous CT. Largest of these was positioned in the right lobe it measured 2.4 x 1.7 x 1.9 cm Liver demonstrates a nodular contour, suggesting cirrhosis. IVC: No abnormality visualized. Pancreas: Visualized portion unremarkable. Spleen: Size and appearance within normal limits. Right Kidney: Length: 11.3 cm. Increased echogenicity. No hydronephrosis. 0.8 x 0.6 x 0.7 cyst noted. Left Kidney: Length: 11.4 cm. Increased echogenicity. No mass or hydronephrosis visualized. Abdominal aorta: No aneurysm visualized. Atherosclerotic calcifications noted. Other findings: Right pleural effusion. IMPRESSION: 1. Normal sonographic evaluation of the gallbladder. 2. Nodular contour of the liver, suggestive of cirrhosis. Several scattered cystic lesions within the liver, grossly similar relative to prior CT. 3. Increased echogenicity within the kidneys, compatible with medical renal disease. No hydronephrosis. 4. Right pleural effusion. Electronically Signed   By: Pincus Badder.D.  On: 06/01/2016 23:13   Ct Abdomen Pelvis W Contrast  Result Date: 06/02/2016 CLINICAL DATA:  Abdominal pain EXAM: CT ABDOMEN AND PELVIS WITH CONTRAST TECHNIQUE: Multidetector CT imaging of the abdomen and pelvis was performed using the standard protocol following bolus administration of intravenous contrast. CONTRAST:  1 ISOVUE-300 IOPAMIDOL (ISOVUE-300) INJECTION 61% COMPARISON:   11/04/2015 FINDINGS: Lower chest and abdominal wall: Small right pleural effusion, partially loculated. Patient has had interval right lower lobe surgery. Hepatobiliary: Lobulated liver surface compatible with cirrhosis. There scattered hepatic cysts and small hypervascular areas that favor shunts. Transient hypoperfusion in segment 3. Chronic prominence of the extrahepatic biliary tree without visualized stone or mass. Pancreas: Unremarkable. Spleen: Unremarkable. Adrenals/Urinary Tract: Negative adrenals. Numerous small presumed right renal cyst. No hydronephrosis or ureteral calculus. Visualization of the entire bladder is limited by streak artifact from hip prostheses. No acute finding. Stomach/Bowel: Thickened small bowel loops in the right lower quadrant from submucosal edema, with avid mucosal and serosal enhancement. Central to these loops is extraluminal gas surrounding a blind-ending structure extending from a more inferior small bowel loop, most convincing on coronal reformats. Findings consistent with a perforated small bowel diverticulum, presumably Meckel's diverticulum in this location. No evidence of foreign body. The appendix is not discretely seen; the inflamed bowel is in close continuity to the cecum but does not appear directly contiguous with it. Colonic diverticulosis. Reproductive:Hysterectomy. Vascular/Lymphatic: No acute vascular abnormality. No mass or adenopathy. Other: No ascites or pneumoperitoneum. Musculoskeletal: No acute finding. Degenerative lumbar dextroscoliosis. Remote T12 compression fracture. Bilateral total hip arthroplasty. These results were called by telephone at the time of interpretation on 06/02/2016 at 1:09 am to Dr. Lily Kocher , who verbally acknowledged these results. IMPRESSION: 1. 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 No abscess. 2. Small postoperative partially loculated right pleural effusion. 3.  Cirrhosis of  liver. ([id number]-Y70.62) 4.  Aortic Atherosclerosis (ICD10-170.0) Electronically Signed   By: Monte Fantasia M.D.   On: 06/02/2016 01:15    Review of Systems  Constitutional: Negative.  Negative for chills, fever and weight loss.  HENT: Negative for ear pain, hearing loss and tinnitus.   Eyes: Negative for blurred vision, double vision and photophobia.  Respiratory: Negative for cough, hemoptysis, sputum production and shortness of breath.   Cardiovascular: Negative for chest pain, palpitations and orthopnea.  Gastrointestinal: Positive for abdominal pain and nausea. Negative for blood in stool, constipation, diarrhea, heartburn, melena and vomiting.  Genitourinary: Negative for dysuria, frequency, hematuria and urgency.  Musculoskeletal: Negative for neck pain.  Skin: Negative for itching and rash.  Neurological: Negative for dizziness, tingling, tremors, sensory change and headaches.  Psychiatric/Behavioral: Negative for depression, substance abuse and suicidal ideas.   Blood pressure (!) 116/56, pulse 73, temperature 97.9 F (36.6 C), temperature source Oral, resp. rate 16, height 5' 1.25" (1.556 m), weight 62.3 kg (137 lb 6.4 oz), last menstrual period 10/16/1975, SpO2 98 %. Physical Exam  Constitutional: She is oriented to person, place, and time. She appears well-developed and well-nourished. No distress.  HENT:  Head: Normocephalic and atraumatic.  Right Ear: External ear normal.  Left Ear: External ear normal.  Eyes: Conjunctivae and EOM are normal. Pupils are equal, round, and reactive to light.  Neck: Normal range of motion. Neck supple. No tracheal deviation present. No thyromegaly present.  Cardiovascular: Normal rate, normal heart sounds and intact distal pulses.  Exam reveals no gallop and no friction rub.   No murmur heard. Respiratory: Effort normal and breath  sounds normal. No stridor. No respiratory distress. She has no wheezes. She has no rales. She exhibits no  tenderness.  GI: Soft. Bowel sounds are normal. She exhibits no distension and no mass. There is tenderness (RLQ mild ttp). There is no rebound and no guarding.  Musculoskeletal: Normal range of motion.  Neurological: She is alert and oriented to person, place, and time.  Skin: Skin is warm and dry. She is not diaphoretic.  Psychiatric: She has a normal mood and affect. Her behavior is normal.    Assessment/Plan: 82 y/o F with contained perforated bowel, possible Meckel's Diverticulum Afib Hyponatremia HTN  Pt appears to be improving since her onset on Wednesday.  Secondary to her umbilicus at this point would not recommend operative treatment. Recommend holding her Elliquis at this time. Recommend Zosyn. Recommend continue nothing by mouth, IV fluids. This patient continues to improve with minimal abdominal pain she may be able to avoid surgery. If soreness continues with minimal improvement she may require laparoscopy/laparotomy.  We'll continue to follow.   Rosario Jacks., Ahmiya Abee 06/02/2016, 6:34 AM

## 2016-06-02 NOTE — Progress Notes (Signed)
ANTICOAGULATION CONSULT NOTE - Initial Consult  Pharmacy Consult for Heparin (while holding Apixaban) Indication: atrial fibrillation  Allergies  Allergen Reactions  . Aspirin Rash    Patient Measurements: Height: 5' 1.25" (155.6 cm) Weight: 137 lb 6.4 oz (62.3 kg) IBW/kg (Calculated) : 48.38 Vital Signs: Temp: 98.4 F (36.9 C) (08/19 0907) Temp Source: Oral (08/19 0907) BP: 133/73 (08/19 0907) Pulse Rate: 73 (08/19 0907)  Labs:  Recent Labs  06/01/16 2133 06/02/16 0240 06/02/16 0640 06/02/16 1319  HGB 13.4  --  12.5  --   HCT 40.1  --  38.0  --   PLT 163  --  137*  --   APTT  --  35  --  50*  LABPROT 16.5*  --   --   --   INR 1.33  --   --   --   HEPARINUNFRC  --  1.82*  --  0.94*  CREATININE 0.61 0.56 0.64  --     Estimated Creatinine Clearance: 48.6 mL/min (by C-G formula based on SCr of 0.8 mg/dL).   Medical History: Past Medical History:  Diagnosis Date  . Arthritis 1996  . Atrial fibrillation (HCC)    on coumadin  . Atrial tachycardia ([place])   . Cancer [facility]) 1988   breast cancer, R mastectomy, chemo  . 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 Diverticulitis 1999  . Femur fracture ([place]) 03/2013   left, non weight bearing for 2 1/2 weeks  . GERD (gastroesophageal reflux disease)   . Hemorrhoids 1991  . HTN (hypertension)   . Lymphedema of upper extremity    right arm  . Osteoporosis   . PONV (postoperative nausea and vomiting)   . Urinary, incontinence, stress female   . Vitamin D deficiency disease     Assessment: 82 yo f on Eliquis PTA for afib. Pharmacy to dose heparin while Eliquis on hold. Dosing heparin based on aPTTs until effects of DOAC diminish. Baseline HL 1.82, aPTT 35.  HL this afternoon trending down to 0.94, aPTT now 50 (low).  CBC stable.   Goal of Therapy:  Heparin level 0.3-0.7 units/ml aPTT 66-102 seconds Monitor platelets by anticoagulation protocol: Yes   Plan:  - Increase  heparin infusion to 1050 units/hr - 8-hr HL/aPTT @ 2330 - Daily CBC/HL/aPTT - Monitor for s/sx of bleeding  Verdene Creson L. Nicole Kindred, PharmD Clinical Pharmacist Pager: [phone] 06/02/2016 3:32 PM

## 2016-06-02 NOTE — Progress Notes (Signed)
Pharmacy Antibiotic Note  [name] is a 82 y.o. female admitted on 06/01/2016 with abdominal pain.  Pharmacy has been consulted for Zosyn dosing. WBC WNL. Renal function ok. Pt was admitted with a Na level of 20.   Plan: -Zosyn 3.375G IV q8h to be infused over 4 hours -Trend WBC, temp, renal function  -F/U infectious work-up  Height: 5' 1.25" (155.6 cm) Weight: 139 lb (63 kg) IBW/kg (Calculated) : 48.38  Temp (24hrs), Avg:98.2 F (36.8 C), Min:98.1 F (36.7 C), Max:98.2 F (36.8 C)   Recent Labs Lab 06/01/16 2133  WBC 9.6  CREATININE 0.61    Estimated Creatinine Clearance: 48.9 mL/min (by C-G formula based on SCr of 0.8 mg/dL).    Allergies  Allergen Reactions  . Aspirin Rash     Narda Bonds 06/02/2016 1:45 AM

## 2016-06-02 NOTE — Progress Notes (Signed)
[place] for [facility] Indication: atrial fibrillation  Allergies  Allergen Reactions  . Aspirin Rash    Patient Measurements: Height: 5' 1.25" (155.6 cm) Weight: 130 lb (59 kg) IBW/kg (Calculated) : 48.38 Vital Signs: Temp: 98.4 F (36.9 C) (08/19 2056) Temp Source: Oral (08/19 1726) BP: 149/76 (08/19 2056) Pulse Rate: 79 (08/19 2056)  Labs:  Recent Labs  06/01/16 2133 06/02/16 0240 06/02/16 0640 06/02/16 1319 06/02/16 2251  HGB 13.4  --  12.5  --   --   HCT 40.1  --  38.0  --   --   PLT 163  --  137*  --   --   APTT  --  35  --  50* 79*  LABPROT 16.5*  --   --   --   --   INR 1.33  --   --   --   --   HEPARINUNFRC  --  1.82*  --  0.94* 0.92*  CREATININE 0.61 0.56 0.64  --   --     Estimated Creatinine Clearance: 47.3 mL/min (by C-G formula based on SCr of 0.8 mg/dL).  Assessment: 82 y.o. female with small bowel perforation, h/o Afib and Eliquis on hold, for heparin  Goal of Therapy:  Heparin level 0.3-0.7 units/ml aPTT 66-102 seconds while Eliquis interfering with anti-Xa level Monitor platelets by anticoagulation protocol: Yes   Plan:  Continue Heparin at current rate  Follow-up am labs.   Caryl Pina 06/02/2016,11:39 PM

## 2016-06-02 NOTE — Progress Notes (Signed)
.  06/01/2016  8:03 PM  06/02/2016 11:16 AM  [name] was seen and examined.  The H&P by the admitting provider , orders, imaging was reviewed.  Please see orders.  Will continue to follow.   Murvin Natal, MD [facility]

## 2016-06-02 NOTE — H&P (Signed)
History and Physical    [name] FHL:[id number] DOB: [birth year] DOA: 06/01/2016  PCP: Marton Redwood, MD  CT Surgeon: Roxan Hockey  Patient coming from: Home  Chief Complaint: Abdominal pain, low sodium level on outpatient labs  HPI: [name] is a 82 y.o. 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  She had a diaphragmatic cyst removed during that surgery as well.  She feels that she has recovered will from lung surgery and was in her baseline state of health until Wednesday when she developed acute right sided abdominal pain.  She has had mild nausea but no significant vomiting.  No diarrhea.  No weight loss.  No feer.  No lower urinary tract symptoms.  She presented to her PCP for evaluation of the abdominal pain and basic labs were drawn.  She was notified to present to the ED today for further evaluation and management of a low sodium level.   ED Course: Chest xray shows a small right sided pleural effusion.  Abdominal ultrasound show unremarkable gallbladder appearance but nodular appearance of the liver suggestive of cirrhosis.  Sodium 120, chloride decreased as well.  Normal renal function.  Isolated elevation of total bilirubin to 3.8; the rest of her LFTs are unremarkable.  She was started on NS infusion.  Hospitalist asked to admit.  CT scan of the abdomen and pelvis ordered STAT due to history of several abnormal findings on her last CT in January, ongoing complaints of abdominal pain, and hyponatremia of unclear etiology (SIADH related to malignancy would be on the differential).  Unfortunately, I was called directly by the radiologist for abnormal findings of free air in her abdomen concerning for perforated small bowel diverticulum.  General Surgery has been  consulted.  Of note, she appears to have persistent changes in the liver suggestive of cirrhosis.    Review of Systems: As per HPI otherwise 10 point review of systems negative.    Past Medical History:  Diagnosis Date  . Arthritis 1996  . Atrial fibrillation (HCC)    on coumadin  . Atrial tachycardia ([place])   . Cancer [facility]) 1988   breast cancer, R mastectomy, chemo  . 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 Diverticulitis 1999  . Femur fracture ([place]) 03/2013   left, non weight bearing for 2 1/2 weeks  . GERD (gastroesophageal reflux disease)   . Hemorrhoids 1991  . HTN (hypertension)   . Lymphedema of upper extremity    right arm  . Osteoporosis   . PONV (postoperative nausea and vomiting)   . Urinary, incontinence, stress female   . Vitamin D deficiency disease     Past Surgical History:  Procedure Laterality Date  . APPENDECTOMY    . BREAST BIOPSY  05/04/1987   Right Dr Annamaria Boots  . BREAST IMPLANT REMOVAL Right 06/05/10  . BUNIONECTOMY  1988  . carcinoid Right    Right lower lobectomy 2017  . COLONOSCOPY W/ POLYPECTOMY    . EYE SURGERY  5/14 ; 6/14   Cataract Surgery  . JOINT REPLACEMENT     2002 left; 2009 right Hip, rt knee 2012  . KNEE ARTHROPLASTY Right 2012   [facility]  . LOBECTOMY Right 01/05/2016   Procedure: RIGHT LOWER LOBE LOBECTOMY;  Surgeon:  Melrose Nakayama, MD;  Location: [place];  Service: Thoracic;  Laterality: Right;  . MASTECTOMY PARTIAL / LUMPECTOMY W/ AXILLARY LYMPHADENECTOMY  04/05/1987   Right - Dr Annamaria Boots  . RESECTION OF MEDIASTINAL MASS N/A 01/05/2016   Procedure: RESECTION OF PLEURAL MASS, right;  Surgeon: Melrose Nakayama, MD;  Location: [place];  Service: Thoracic;  Laterality: N/A;  . TONSILLECTOMY    . TOTAL HIP ARTHROPLASTY    . TUBAL LIGATION Bilateral 1970  . VAGINAL HYSTERECTOMY  1977   [place]  . VIDEO ASSISTED THORACOSCOPY (VATS)/WEDGE RESECTION Right 01/05/2016   Procedure: RIGHT  VIDEO ASSISTED THORACOSCOPY (VATS)/WEDGE RESECTION;  Surgeon: Melrose Nakayama, MD;  Location: [place];  Service: Thoracic;  Laterality: Right;     reports that she has never smoked. She has never used smokeless tobacco. She reports that she does not drink alcohol or use drugs.  She is a widow.  She has three adult children.  Her son Louie Casa is her next of kin; designated POA.  Allergies  Allergen Reactions  . Aspirin Rash    Family History  Problem Relation Age of Onset  . Colon cancer Mother   . Colon cancer Father   . Heart disease Father      Prior to Admission medications   Medication Sig Start Date End Date Taking? Authorizing Provider  acetaminophen (TYLENOL) 500 MG tablet Take 2 tablets (1,000 mg total) by mouth every 6 (six) hours as needed for mild pain. 01/17/16  Yes Donielle Liston Alba, PA-C  amLODipine (NORVASC) 5 MG tablet Take 5 mg by mouth daily.   Yes Historical Provider, MD  apixaban (ELIQUIS) 5 MG TABS tablet Take 5 mg by mouth 2 (two) times daily.   Yes Historical Provider, MD  Calcium Carbonate-Vitamin D (CALTRATE 600+D) 600-400 MG-UNIT per tablet Take 1 tablet by mouth daily.   Yes Historical Provider, MD  carboxymethylcellulose (REFRESH PLUS) 0.5 % SOLN Place 1 drop into both eyes 2 (two) times daily as needed (for dry eyes).   Yes Historical Provider, MD  carvedilol (COREG) 12.5 MG tablet Take 12.5 mg by mouth 2 (two) times daily with a meal.   Yes Historical Provider, MD  Ergocalciferol (VITAMIN D2) 400 units TABS Take 400 Units by mouth daily.   Yes Historical Provider, MD  Estradiol (VAGIFEM) 10 MCG TABS vaginal tablet Place 1 tablet (10 mcg total) vaginally 2 (two) times a week. 07/04/15  Yes Kem Boroughs, FNP  Multiple Vitamin (MULTIVITAMIN WITH MINERALS) TABS Take 1 tablet by mouth every evening.   Yes Historical Provider, MD  Omega-3 Fatty Acids (FISH OIL) 1000 MG CAPS Take 1 capsule by mouth 2 (two) times daily.   Yes Historical Provider, MD  telmisartan  (MICARDIS) 80 MG tablet Take 80 mg by mouth daily.   Yes Historical Provider, MD  carvedilol (COREG) 6.25 MG tablet Take 1 tablet (6.25 mg total) by mouth 2 (two) times daily with a meal. Patient not taking: Reported on 06/01/2016 01/17/16   John Giovanni, PA-C    Physical Exam: Vitals:   06/01/16 2115 06/01/16 2334 06/01/16 2335 06/01/16 2345  BP: 143/78 131/65  135/78  Pulse: 74  72 80  Resp: 21     Temp:      TempSrc:      SpO2: 98%  97% 97%  Weight:      Height:          Constitutional: NAD, calm, comfortable, NONtoxic appearing Vitals:   06/01/16 2115 06/01/16 2334 06/01/16  2335 06/01/16 2345  BP: 143/78 131/65  135/78  Pulse: 74  72 80  Resp: 21     Temp:      TempSrc:      SpO2: 98%  97% 97%  Weight:      Height:       Eyes: PERRL, lids and conjunctivae normal ENMT: Mucous membranes are moist. Posterior pharynx clear of any exudate or lesions. Normal dentition.  Neck: normal appearance, supple Respiratory: clear to auscultation bilaterally, no wheezing, no crackles. Normal respiratory effort. No accessory muscle use.  Cardiovascular: Irregular but rate controlled.  Bilateral led edema without significant pitting.  2+ pedal pulses.   GI: abdomen is soft and compressible.  No significant distention, firmness, or rigidity.  No guarding but she is TTP on the right side and in the periumbilical region.  No masses palpated.  Bowel sounds are present but are not hyperactive. Musculoskeletal:  No joint deformity in upper and lower extremities. Good ROM, no contractures. Normal muscle tone.  Skin: no rashes, warm and dry Neurologic: CN 2-12 grossly intact. Sensation intact, Strength symmetric bilaterally, 5/5  Psychiatric: Normal judgment and insight. Alert and oriented x 3. Normal mood.     Labs on Admission: I have personally reviewed following labs and imaging studies  CBC:  Recent Labs Lab 06/01/16 2133  WBC 9.6  HGB 13.4  HCT 40.1  MCV 92.2  PLT 409   Basic  Metabolic Panel:  Recent Labs Lab 06/01/16 2133  NA 120*  K 3.9  CL 87*  CO2 23  GLUCOSE 109*  BUN 8  CREATININE 0.61  CALCIUM 8.7*   GFR: Estimated Creatinine Clearance: 48.9 mL/min (by C-G formula based on SCr of 0.8 mg/dL). Liver Function Tests:  Recent Labs Lab 06/01/16 2133  AST 29  ALT 16  ALKPHOS 64  BILITOT 3.8*  PROT 7.2  ALBUMIN 3.9    Recent Labs Lab 06/01/16 2133  LIPASE 32   Coagulation Profile:  Recent Labs Lab 06/01/16 2133  INR 1.33   URINE:    Component Value Date/Time   COLORURINE AMBER (A) 06/01/2016 2133   APPEARANCEUR CLEAR 06/01/2016 2133   LABSPEC 1.020 06/01/2016 2133   PHURINE 6.0 06/01/2016 2133   GLUCOSEU NEGATIVE 06/01/2016 2133   HGBUR NEGATIVE 06/01/2016 2133   BILIRUBINUR NEGATIVE 06/01/2016 2133   BILIRUBINUR n 07/23/2013 1043   KETONESUR 15 (A) 06/01/2016 2133   PROTEINUR 30 (A) 06/01/2016 2133   UROBILINOGEN 0.2 04/23/2015 1100   NITRITE NEGATIVE 06/01/2016 2133   LEUKOCYTESUR SMALL (A) 06/01/2016 2133    Radiological Exams on Admission: Dg Chest 2 View  Result Date: 06/01/2016 CLINICAL DATA:  Right upper abdominal pain EXAM: CHEST  2 VIEW COMPARISON:  02/14/2016 FINDINGS: Cardiomediastinal silhouette is stable. Again noted small right pleural effusion with right basilar atelectasis. Surgical clips in right axilla again noted. No infiltrate or pulmonary edema. Hyperinflation again noted. IMPRESSION: No active cardiopulmonary disease. Again noted hyperinflation. Stable small right pleural effusion with right basilar atelectasis. Electronically Signed   By: Lahoma Crocker M.D.   On: 06/01/2016 22:08   US Abdomen Complete  Result Date: 06/01/2016 CLINICAL DATA:  Initial evaluation for acute right upper quadrant abdominal pain. EXAM: ABDOMEN ULTRASOUND COMPLETE COMPARISON:  Prior CT from 11/04/2015. FINDINGS: Gallbladder: No gallstones or wall thickening visualized. No sonographic Murphy sign noted by sonographer. Common bile  duct: Diameter: 4.5 mm Liver: Multiple small cystic lesions noted within liver, grossly similar to previous CT. Largest of these was positioned  in the right lobe it measured 2.4 x 1.7 x 1.9 cm Liver demonstrates a nodular contour, suggesting cirrhosis. IVC: No abnormality visualized. Pancreas: Visualized portion unremarkable. Spleen: Size and appearance within normal limits. Right Kidney: Length: 11.3 cm. Increased echogenicity. No hydronephrosis. 0.8 x 0.6 x 0.7 cyst noted. Left Kidney: Length: 11.4 cm. Increased echogenicity. No mass or hydronephrosis visualized. Abdominal aorta: No aneurysm visualized. Atherosclerotic calcifications noted. Other findings: Right pleural effusion. IMPRESSION: 1. Normal sonographic evaluation of the gallbladder. 2. Nodular contour of the liver, suggestive of cirrhosis. Several scattered cystic lesions within the liver, grossly similar relative to prior CT. 3. Increased echogenicity within the kidneys, compatible with medical renal disease. No hydronephrosis. 4. Right pleural effusion. Electronically Signed   By: Jeannine Boga M.D.   On: 06/01/2016 23:13   Ct Abdomen Pelvis W Contrast  Result Date: 06/02/2016 CLINICAL DATA:  Abdominal pain EXAM: CT ABDOMEN AND PELVIS WITH CONTRAST TECHNIQUE: Multidetector CT imaging of the abdomen and pelvis was performed using the standard protocol following bolus administration of intravenous contrast. CONTRAST:  1 ISOVUE-300 IOPAMIDOL (ISOVUE-300) INJECTION 61% COMPARISON:  11/04/2015 FINDINGS: Lower chest and abdominal wall: Small right pleural effusion, partially loculated. Patient has had interval right lower lobe surgery. Hepatobiliary: Lobulated liver surface compatible with cirrhosis. There scattered hepatic cysts and small hypervascular areas that favor shunts. Transient hypoperfusion in segment 3. Chronic prominence of the extrahepatic biliary tree without visualized stone or mass. Pancreas: Unremarkable. Spleen: Unremarkable.  Adrenals/Urinary Tract: Negative adrenals. Numerous small presumed right renal cyst. No hydronephrosis or ureteral calculus. Visualization of the entire bladder is limited by streak artifact from hip prostheses. No acute finding. Stomach/Bowel: Thickened small bowel loops in the right lower quadrant from submucosal edema, with avid mucosal and serosal enhancement. Central to these loops is extraluminal gas surrounding a blind-ending structure extending from a more inferior small bowel loop, most convincing on coronal reformats. Findings consistent with a perforated small bowel diverticulum, presumably Meckel's diverticulum in this location. No evidence of foreign body. The appendix is not discretely seen; the inflamed bowel is in close continuity to the cecum but does not appear directly contiguous with it. Colonic diverticulosis. Reproductive:Hysterectomy. Vascular/Lymphatic: No acute vascular abnormality. No mass or adenopathy. Other: No ascites or pneumoperitoneum. Musculoskeletal: No acute finding. Degenerative lumbar dextroscoliosis. Remote T12 compression fracture. Bilateral total hip arthroplasty. These results were called by telephone at the time of interpretation on 06/02/2016 at 1:09 am to Dr. Lily Kocher , who verbally acknowledged these results. IMPRESSION: 1. 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 No abscess. 2. Small postoperative partially loculated right pleural effusion. 3.  Cirrhosis of liver. (YQI34-V42.59) 4.  Aortic Atherosclerosis (ICD10-170.0) Electronically Signed   By: Monte Fantasia M.D.   On: 06/02/2016 01:15    EKG: Independently reviewed. Atrial fibrillation, rate 74.  Assessment/Plan Principal Problem:   Hyponatremia Active Problems:   Hypertension   Atrial fibrillation, chronic (HCC)   Long term (current) use of anticoagulants   Abdominal pain   Nausea  Perforated ileal diverticulum with regional enteritis, confirmed  after initial admission orders were given.        Perforated ileal diverticulum with regional enteritis --General surgery consultation greatly appreciated --NPO now --Hold Eliquis in the event that she has to go to the OR; will anticoagulate with unfractionated heparin infusion when appropriate --Analgesics and antiemetics as needed --She does not appear to have an acute abdomen at this time --Empiric IV zosyn per pharmacy  Isolated elevation of bilirubin,  abnormal liver appearance concerning for cirrhosis --Consider GI consult at some point (or outpatient follow-up, patient seems reliable) --Repeat levels in the AM, after hydration  Hyponatremia --Check urine sodium, urine osm, serum osm --Hydrate with NS at 75cc/hr for now --HOLD ARB --BMP q6h to follow trend --ketones in urine suggest dehydration, normal renal function noted.  Chronic atrial fibrillation --Telemetry monitoring --Holding Eliquis in case she has to to go the OR --Heparin infusion per protocol per pharmacy when appropriate --IV cardizem bolus prn for rate control while NPO.  If rate control refractory to boluses, she might need higher level of care for continuous infusion.  HTN --IV hydralazine prn since oral meds are on hold due to NPO status    DVT prophylaxis: FULL anticoagulation Code Status: FULL Family Communication: Patient's son Louie Casa present at bedside in the ED at the time of my initial assessment.  When I went back to disclose the CT results, he was gone.  I offered to call him twice with an update, but the patient declined at this time.   Disposition Plan: To be determined Consults called: General Surgery, Rosendo Gros Admission status: Observation telemetry initially but she should meet inpatient now with acute CT findings.   TIME SPENT: 80 minutes   Eber Jones MD [facility] Pager [phone]  If 7PM-7AM, please contact night-coverage [email or web address] Password  [email or web address]  06/02/2016, 12:19 AM

## 2016-06-02 NOTE — Progress Notes (Addendum)
ANTICOAGULATION CONSULT NOTE - Initial Consult  Pharmacy Consult for Heparin (while holding Apixaban) Indication: atrial fibrillation  Allergies  Allergen Reactions  . Aspirin Rash    Patient Measurements: Height: 5' 1.25" (155.6 cm) Weight: 137 lb 6.4 oz (62.3 kg) IBW/kg (Calculated) : 48.38 Vital Signs: Temp: 97.9 F (36.6 C) (08/19 0421) Temp Source: Oral (08/19 0421) BP: 116/56 (08/19 0421) Pulse Rate: 73 (08/19 0421)  Labs:  Recent Labs  06/01/16 2133 06/02/16 0240  HGB 13.4  --   HCT 40.1  --   PLT 163  --   LABPROT 16.5*  --   INR 1.33  --   CREATININE 0.61 0.56    Estimated Creatinine Clearance: 48.6 mL/min (by C-G formula based on SCr of 0.8 mg/dL).   Medical History: Past Medical History:  Diagnosis Date  . Arthritis 1996  . Atrial fibrillation (HCC)    on coumadin  . Atrial tachycardia ([place])   . Cancer [facility]) 1988   breast cancer, R mastectomy, chemo  . 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 Diverticulitis 1999  . Femur fracture ([place]) 03/2013   left, non weight bearing for 2 1/2 weeks  . GERD (gastroesophageal reflux disease)   . Hemorrhoids 1991  . HTN (hypertension)   . Lymphedema of upper extremity    right arm  . Osteoporosis   . PONV (postoperative nausea and vomiting)   . Urinary, incontinence, stress female   . Vitamin D deficiency disease     Assessment: Starting heparin while holding Apixaban in case she needs a procedure. Baseline aPTT and anti-Xa level have been drawn. If then anti-Xa is elevated due to Apixaban use, will use aPTT to dose the heparin until the two level correlate. Last dose of apixaban was >12 hours ago, will start heparin now.   Goal of Therapy:  Heparin level 0.3-0.7 units/ml aPTT 66-102 seconds Monitor platelets by anticoagulation protocol: Yes   Plan:  -Start heparin drip at 900 units/hr -1300 aPTT/HL -Daily CBC/HL/aPTT -Monitor for bleeding  Narda Bonds 06/02/2016,5:25 AM

## 2016-06-03 DIAGNOSIS — E876 Hypokalemia: Secondary | ICD-10-CM

## 2016-06-03 LAB — COMPREHENSIVE METABOLIC PANEL
ALT: 15 U/L (ref 14–54)
AST: 19 U/L (ref 15–41)
Albumin: 3 g/dL — ABNORMAL LOW (ref 3.5–5.0)
Alkaline Phosphatase: 50 U/L (ref 38–126)
Anion gap: 15 (ref 5–15)
BILIRUBIN TOTAL: 2.2 mg/dL — AB (ref 0.3–1.2)
BUN: 5 mg/dL — AB (ref 6–20)
CHLORIDE: 98 mmol/L — AB (ref 101–111)
CO2: 21 mmol/L — ABNORMAL LOW (ref 22–32)
CREATININE: 0.67 mg/dL (ref 0.44–1.00)
Calcium: 8.5 mg/dL — ABNORMAL LOW (ref 8.9–10.3)
Glucose, Bld: 65 mg/dL (ref 65–99)
Potassium: 3.2 mmol/L — ABNORMAL LOW (ref 3.5–5.1)
Sodium: 134 mmol/L — ABNORMAL LOW (ref 135–145)
TOTAL PROTEIN: 5.8 g/dL — AB (ref 6.5–8.1)

## 2016-06-03 LAB — CBC
HEMATOCRIT: 35.1 % — AB (ref 36.0–46.0)
Hemoglobin: 11.1 g/dL — ABNORMAL LOW (ref 12.0–15.0)
MCH: 29.8 pg (ref 26.0–34.0)
MCHC: 31.6 g/dL (ref 30.0–36.0)
MCV: 94.4 fL (ref 78.0–100.0)
PLATELETS: 135 10*3/uL — AB (ref 150–400)
RBC: 3.72 MIL/uL — AB (ref 3.87–5.11)
RDW: 14.3 % (ref 11.5–15.5)
WBC: 4.8 10*3/uL (ref 4.0–10.5)

## 2016-06-03 LAB — APTT: APTT: 100 s — AB (ref 24–36)

## 2016-06-03 LAB — HEPARIN LEVEL (UNFRACTIONATED): HEPARIN UNFRACTIONATED: 0.65 [IU]/mL (ref 0.30–0.70)

## 2016-06-03 MED ORDER — ENOXAPARIN SODIUM 60 MG/0.6ML ~~LOC~~ SOLN
60.0000 mg | Freq: Two times a day (BID) | SUBCUTANEOUS | Status: DC
  Administered 2016-06-03 – 2016-06-05 (×4): 60 mg via SUBCUTANEOUS
  Filled 2016-06-03 (×4): qty 0.6

## 2016-06-03 MED ORDER — POTASSIUM CHLORIDE IN NACL 20-0.9 MEQ/L-% IV SOLN
INTRAVENOUS | Status: AC
Start: 2016-06-03 — End: 2016-06-04
  Administered 2016-06-03: 75 mL/h via INTRAVENOUS
  Administered 2016-06-04: 03:00:00 via INTRAVENOUS
  Filled 2016-06-03 (×4): qty 1000

## 2016-06-03 NOTE — Progress Notes (Signed)
ANTICOAGULATION CONSULT NOTE - Initial Consult  Pharmacy Consult for Heparin >> Lovenox Indication: atrial fibrillation  Allergies  Allergen Reactions  . Aspirin Rash    Patient Measurements: Height: 5' 1.25" (155.6 cm) Weight: 130 lb (59 kg) IBW/kg (Calculated) : 48.38  Vital Signs: Temp: 97.5 F (36.4 C) (08/20 1748) Temp Source: Oral (08/20 1748) BP: 136/60 (08/20 1748) Pulse Rate: 78 (08/20 1748)  Labs:  Recent Labs  06/01/16 2133  06/02/16 0240 06/02/16 0640 06/02/16 1319 06/02/16 2251 06/03/16 0550  HGB 13.4  --   --  12.5  --   --  11.1*  HCT 40.1  --   --  38.0  --   --  35.1*  PLT 163  --   --  137*  --   --  135*  APTT  --   < > 35  --  50* 79* 100*  LABPROT 16.5*  --   --   --   --   --   --   INR 1.33  --   --   --   --   --   --   HEPARINUNFRC  --   < > 1.82*  --  0.94* 0.92* 0.65  CREATININE 0.61  --  0.56 0.64  --   --  0.67  < > = values in this interval not displayed.  Estimated Creatinine Clearance: 47.3 mL/min (by C-G formula based on SCr of 0.8 mg/dL).   Medical History: Past Medical History:  Diagnosis Date  . Arthritis 1996  . Atrial fibrillation (HCC)    on coumadin  . Atrial tachycardia ([place])   . Cancer [facility]) 1988   breast cancer, R mastectomy, chemo  . 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 Diverticulitis 1999  . Femur fracture ([place]) 03/2013   left, non weight bearing for 2 1/2 weeks  . GERD (gastroesophageal reflux disease)   . Hemorrhoids 1991  . HTN (hypertension)   . Lymphedema of upper extremity    right arm  . Osteoporosis   . PONV (postoperative nausea and vomiting)   . Urinary, incontinence, stress female   . Vitamin D deficiency disease     Assessment: 82 yo f on Eliquis PTA for afib.  Pt was originally started on heparin but patient is a very hard stick. After multiple attempts and discussions with lab, the RN, and Dr. Wynetta Emery, will change patient over to Lovenox for  easier dosing, monitoring, and less lab sticks. CBC stable. Will start in ~1 hour.  Goal of Therapy:  Monitor platelets by anticoagulation protocol: Yes   Plan:  - Stop heparin - Lovenox 60 mg SQ q12h starting ~2000 - CBC q72h - F/u transition back to Eliquis - Monitor s/sx of bleeding  Kaimen Peine L. Nicole Kindred, PharmD Clinical Pharmacist Pager: [phone] 06/03/2016 6:39 PM

## 2016-06-03 NOTE — Progress Notes (Signed)
PROGRESS NOTE    [name]  DDU:[id number]  DOB: 1934/08/19  DOA: 06/01/2016 PCP: Marton Redwood, MD Outpatient Specialists:  Hospital course: 82 y.o. 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  She had a diaphragmatic cyst removed during that surgery as well.  She feels that she has recovered will from lung surgery and was in her baseline state of health until Wednesday when she developed acute right sided abdominal pain.  Assessment & Plan:    Perforated ileal diverticulum with regional enteritis --General surgery consultation greatly appreciated --NPO, IVF hydration --Hold Eliquis in the event that she has to go to the OR; will anticoagulate with unfractionated heparin infusion when appropriate --Analgesics and antiemetics as needed --Empiric IV zosyn per pharmacy --clinically seems to be getting better, appreciate general surgery following, hopefully can start p.o. diet tomorrow  Isolated elevation of bilirubin, abnormal liver appearance concerning for cirrhosis --Consider GI consult outpatient eval.  Hyponatremia --improving with IVFs --Hydrate with NS at 75cc/hr for now --HOLD ARB --BMP q6h to follow trend --ketones in urine suggest dehydration, normal renal function noted.  Hypokalemia  - replete in IVFs, checking magnesium   Chronic atrial fibrillation --Telemetry monitoring --Holding Eliquis in case she has to to go the OR --Heparin infusion per protocol per pharmacy when appropriate --IV cardizem bolus prn for rate control while NPO.  If rate control refractory to boluses, she might need higher level of care for continuous infusion.  HTN --IV hydralazine prn since oral meds are on hold due to NPO status   DVT prophylaxis: FULL  anticoagulation Code Status: FULL Family Communication: none at bedside Disposition Plan: To be determined Consults called: General Surgery, Rosendo Gros Admission status: Observation telemetry initially but she should meet inpatient now with acute CT findings.  Procedures:  none  Antimicrobials: Anti-infectives    Start     Dose/Rate Route Frequency Ordered Stop   06/02/16 0200  piperacillin-tazobactam (ZOSYN) IVPB 3.375 g     3.375 g 12.5 mL/hr over 240 Minutes Intravenous Every 8 hours 06/02/16 0152        Subjective: Pt says that she is having flatus, she needs to have a BM.  No n/v and no severe abdominal pain like she had on admission.   Objective: Vitals:   06/02/16 1726 06/02/16 2056 06/03/16 0514 06/03/16 1032  BP: 135/68 (!) 149/76 137/90 126/65  Pulse: 78 79 77 78  Resp: '17 20 17 17  '$ Temp: 98.2 F (36.8 C) 98.4 F (36.9 C) 98 F (36.7 C) 98.3 F (36.8 C)  TempSrc: Oral   Oral  SpO2: 96% 94% 97% 96%  Weight:  59 kg (130 lb)    Height:        Intake/Output Summary (Last 24 hours) at 06/03/16 1353 Last data filed at 06/03/16 1206  Gross per 24 hour  Intake          2274.65 ml  Output             1675 ml  Net           599.65 ml   Filed Weights   06/01/16 1652 06/02/16 0421 06/02/16 2056  Weight: 63 kg (139 lb) 62.3 kg (137 lb 6.4 oz) 59 kg (130 lb)    Exam:  General exam: awake, alert appears comfortable, nAD.  Respiratory system: Clear. No increased work of breathing. Cardiovascular system: S1 & S2 heard,  Gastrointestinal system: Abdomen is nondistended, soft hypoactive BS heard. Central nervous system: Alert and oriented. No focal neurological deficits. Extremities: no CCE.  Data Reviewed: Basic Metabolic Panel:  Recent Labs Lab 06/01/16 2133 06/02/16 0240 06/02/16 0640 06/03/16 0550  NA 120* 122* 125* 134*  K 3.9 3.7 3.6 3.2*  CL 87* 90* 90* 98*  CO2 '23 22 25 '$ 21*  GLUCOSE 109* 89 98 65  BUN 8 6 5* 5*  CREATININE 0.61 0.56 0.64 0.67   CALCIUM 8.7* 8.4* 8.7* 8.5*   Liver Function Tests:  Recent Labs Lab 06/01/16 2133 06/02/16 0640 06/03/16 0550  AST '29 22 19  '$ ALT '16 16 15  '$ ALKPHOS 64 60 50  BILITOT 3.8* 2.7* 2.2*  PROT 7.2 6.2* 5.8*  ALBUMIN 3.9 3.4* 3.0*    Recent Labs Lab 06/01/16 2133  LIPASE 32   No results for input(s): AMMONIA in the last 168 hours. CBC:  Recent Labs Lab 06/01/16 2133 06/02/16 0640 06/03/16 0550  WBC 9.6 7.4 4.8  HGB 13.4 12.5 11.1*  HCT 40.1 38.0 35.1*  MCV 92.2 92.5 94.4  PLT 163 137* 135*   Cardiac Enzymes: No results for input(s): CKTOTAL, CKMB, CKMBINDEX, TROPONINI in the last 168 hours. CBG (last 3)  No results for input(s): GLUCAP in the last 72 hours. No results found for this or any previous visit (from the past 240 hour(s)).   Studies: Dg Chest 2 View  Result Date: 06/01/2016 CLINICAL DATA:  Right upper abdominal pain EXAM: CHEST  2 VIEW COMPARISON:  02/14/2016 FINDINGS: Cardiomediastinal silhouette is stable. Again noted small right pleural effusion with right basilar atelectasis. Surgical clips in right axilla again noted. No infiltrate or pulmonary edema. Hyperinflation again noted. IMPRESSION: No active cardiopulmonary disease. Again noted hyperinflation. Stable small right pleural effusion with right basilar atelectasis. Electronically Signed   By: Lahoma Crocker M.D.   On: 06/01/2016 22:08   US Abdomen Complete  Result Date: 06/01/2016 CLINICAL DATA:  Initial evaluation for acute right upper quadrant abdominal pain. EXAM: ABDOMEN ULTRASOUND COMPLETE COMPARISON:  Prior CT from 11/04/2015. FINDINGS: Gallbladder: No gallstones or wall thickening visualized. No sonographic Murphy sign noted by sonographer. Common bile duct: Diameter: 4.5 mm Liver: Multiple small cystic lesions noted within liver, grossly similar to previous CT. Largest of these was positioned in the right lobe it measured 2.4 x 1.7 x 1.9 cm Liver demonstrates a nodular contour, suggesting cirrhosis.  IVC: No abnormality visualized. Pancreas: Visualized portion unremarkable. Spleen: Size and appearance within normal limits. Right Kidney: Length: 11.3 cm. Increased echogenicity. No hydronephrosis. 0.8 x 0.6 x 0.7 cyst noted. Left Kidney: Length: 11.4 cm. Increased echogenicity. No mass or hydronephrosis visualized. Abdominal aorta: No aneurysm visualized. Atherosclerotic calcifications noted. Other findings: Right pleural effusion. IMPRESSION: 1. Normal sonographic evaluation of the gallbladder. 2. Nodular contour of the liver, suggestive of cirrhosis. Several scattered cystic lesions within the liver, grossly similar relative to prior CT. 3. Increased echogenicity within the kidneys, compatible with medical renal disease. No hydronephrosis. 4. Right pleural effusion. Electronically Signed   By: Jeannine Boga M.D.   On: 06/01/2016 23:13   Ct Abdomen Pelvis W Contrast  Result Date: 06/02/2016 CLINICAL DATA:  Abdominal pain EXAM: CT ABDOMEN AND PELVIS WITH CONTRAST TECHNIQUE: Multidetector CT imaging of the abdomen and pelvis was performed using the standard protocol following bolus administration of intravenous contrast. CONTRAST:  1 ISOVUE-300 IOPAMIDOL (ISOVUE-300) INJECTION 61% COMPARISON:  11/04/2015 FINDINGS: Lower chest and abdominal wall: Small right pleural effusion, partially loculated. Patient has had interval right lower lobe surgery. Hepatobiliary: Lobulated liver surface compatible with cirrhosis. There scattered hepatic cysts and small hypervascular areas that favor shunts. Transient hypoperfusion in segment 3. Chronic prominence of the extrahepatic biliary tree without visualized stone or mass. Pancreas: Unremarkable. Spleen: Unremarkable. Adrenals/Urinary Tract: Negative adrenals. Numerous small presumed right renal cyst. No hydronephrosis or ureteral calculus. Visualization of the entire bladder is limited by streak artifact from hip prostheses. No acute finding. Stomach/Bowel: Thickened  small bowel loops in the right lower quadrant from submucosal edema, with avid mucosal and serosal enhancement. Central to these loops is extraluminal gas surrounding a blind-ending structure extending from a more inferior small bowel loop, most convincing on coronal reformats. Findings consistent with a perforated small bowel diverticulum, presumably Meckel's diverticulum in this location. No evidence of foreign body. The appendix is not discretely seen; the inflamed bowel is in close continuity to the cecum but does not appear directly contiguous with it. Colonic diverticulosis. Reproductive:Hysterectomy. Vascular/Lymphatic: No acute vascular abnormality. No mass or adenopathy. Other: No ascites or pneumoperitoneum. Musculoskeletal: No acute finding. Degenerative lumbar dextroscoliosis. Remote T12 compression fracture. Bilateral total hip arthroplasty. These results were called by telephone at the time of interpretation on 06/02/2016 at 1:09 am to Dr. Lily Kocher , who verbally acknowledged these results. IMPRESSION: 1. 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 No abscess. 2. Small postoperative partially loculated right pleural effusion. 3.  Cirrhosis of liver. ([id number]-F02.77) 4.  Aortic Atherosclerosis (ICD10-170.0) Electronically Signed   By: Monte Fantasia M.D.   On: 06/02/2016 01:15     Scheduled Meds: . piperacillin-tazobactam (ZOSYN)  IV  3.375 g Intravenous Q8H  . sodium chloride flush  3 mL Intravenous Q12H   Continuous Infusions: . 0.9 % NaCl with KCl 20 mEq / L    . 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

## 2016-06-03 NOTE — Progress Notes (Signed)
ANTICOAGULATION CONSULT NOTE - Initial Consult  Pharmacy Consult for Heparin (while holding Apixaban) Indication: atrial fibrillation  Allergies  Allergen Reactions  . Aspirin Rash    Patient Measurements: Height: 5' 1.25" (155.6 cm) Weight: 130 lb (59 kg) IBW/kg (Calculated) : 48.38 Vital Signs: Temp: 98.3 F (36.8 C) (08/20 1032) Temp Source: Oral (08/20 1032) BP: 126/65 (08/20 1032) Pulse Rate: 78 (08/20 1032)  Labs:  Recent Labs  06/01/16 2133  06/02/16 0240 06/02/16 0640 06/02/16 1319 06/02/16 2251 06/03/16 0550  HGB 13.4  --   --  12.5  --   --  11.1*  HCT 40.1  --   --  38.0  --   --  35.1*  PLT 163  --   --  137*  --   --  135*  APTT  --   < > 35  --  50* 79* 100*  LABPROT 16.5*  --   --   --   --   --   --   INR 1.33  --   --   --   --   --   --   HEPARINUNFRC  --   < > 1.82*  --  0.94* 0.92* 0.65  CREATININE 0.61  --  0.56 0.64  --   --  0.67  < > = values in this interval not displayed.  Estimated Creatinine Clearance: 47.3 mL/min (by C-G formula based on SCr of 0.8 mg/dL).   Medical History: Past Medical History:  Diagnosis Date  . Arthritis 1996  . Atrial fibrillation ([facility])    on coumadin  . Atrial tachycardia ([place])   . Cancer [facility] 1988   breast cancer, R mastectomy, chemo  . 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 Diverticulitis 1999  . Femur fracture ([place]) 03/2013   left, non weight bearing for 2 1/2 weeks  . GERD (gastroesophageal reflux disease)   . Hemorrhoids 1991  . HTN (hypertension)   . Lymphedema of upper extremity    right arm  . Osteoporosis   . PONV (postoperative nausea and vomiting)   . Urinary, incontinence, stress female   . Vitamin D deficiency disease     Assessment: 82 yo f on Eliquis PTA for afib. Pharmacy to dose heparin while Eliquis on hold. Dosing heparin based on aPTTs until effects of Eliquis diminish. Baseline HL 1.82, aPTT 35. HL now therapeutic at 0.64, aPTT  therapeutic at 100. Will get one more aPTT/HL to confirm and then dose based on HLs only. Continue 1050 units/hr. CBC stable.   Goal of Therapy:  Heparin level 0.3-0.7 units/ml aPTT 66-102 seconds Monitor platelets by anticoagulation protocol: Yes   Plan:  - Continue heparin infusion at 1050 units/hr - 1400 aPTT/HL - Daily CBC/HL/aPTT - Monitor for s/sx of bleeding  Keiran Sias L. Nicole Kindred, PharmD Clinical Pharmacist Pager: [phone] 06/03/2016 12:25 PM

## 2016-06-03 NOTE — Progress Notes (Signed)
Patient ID: [name], female   DOB: [birth year], 82 y.o.   MRN: [id number] [facility] Surgery Progress Note:   * No surgery found *  Subjective: Mental status is clear.  Feeling overall better Objective: Vital signs in last 24 hours: Temp:  [98 F (36.7 C)-98.4 F (36.9 C)] 98 F (36.7 C) (08/20 0514) Pulse Rate:  [77-79] 77 (08/20 0514) Resp:  [17-20] 17 (08/20 0514) BP: (135-149)/(68-90) 137/90 (08/20 0514) SpO2:  [94 %-97 %] 97 % (08/20 0514) Weight:  [59 kg (130 lb)] 59 kg (130 lb) (08/19 2056)  Intake/Output from previous day: 08/19 0701 - 08/20 0700 In: 2189.2 [I.V.:2039.2; IV Piggyback:150] Out: 2775 [Urine:2775] Intake/Output this shift: Total I/O In: 85.5 [I.V.:85.5] Out: -   Physical Exam: Work of breathing is normal.  Abdomen is really nontender  Lab Results:  Results for orders placed or performed during the hospital encounter of 06/01/16 (from the past 48 hour(s))  Lipase, blood     Status: None   Collection Time: 06/01/16  9:33 PM  Result Value Ref Range   Lipase 32 11 - 51 U/L  Comprehensive metabolic panel     Status: Abnormal   Collection Time: 06/01/16  9:33 PM  Result Value Ref Range   Sodium 120 (L) 135 - 145 mmol/L   Potassium 3.9 3.5 - 5.1 mmol/L   Chloride 87 (L) 101 - 111 mmol/L   CO2 23 22 - 32 mmol/L   Glucose, Bld 109 (H) 65 - 99 mg/dL   BUN 8 6 - 20 mg/dL   Creatinine, Ser 0.61 0.44 - 1.00 mg/dL   Calcium 8.7 (L) 8.9 - 10.3 mg/dL   Total Protein 7.2 6.5 - 8.1 g/dL   Albumin 3.9 3.5 - 5.0 g/dL   AST 29 15 - 41 U/L   ALT 16 14 - 54 U/L   Alkaline Phosphatase 64 38 - 126 U/L   Total Bilirubin 3.8 (H) 0.3 - 1.2 mg/dL   GFR calc non Af Amer >60 >60 mL/min   GFR calc Af Amer >60 >60 mL/min    Comment: (NOTE) The eGFR has been calculated using the CKD EPI equation. This calculation has not been validated in all clinical situations. eGFR's persistently <60 mL/min signify possible Chronic Kidney Disease.    Anion gap 10 5 - 15  CBC      Status: None   Collection Time: 06/01/16  9:33 PM  Result Value Ref Range   WBC 9.6 4.0 - 10.5 K/uL   RBC 4.35 3.87 - 5.11 MIL/uL   Hemoglobin 13.4 12.0 - 15.0 g/dL   HCT 40.1 36.0 - 46.0 %   MCV 92.2 78.0 - 100.0 fL   MCH 30.8 26.0 - 34.0 pg   MCHC 33.4 30.0 - 36.0 g/dL   RDW 14.1 11.5 - 15.5 %   Platelets 163 150 - 400 K/uL  Protime-INR     Status: Abnormal   Collection Time: 06/01/16  9:33 PM  Result Value Ref Range   Prothrombin Time 16.5 (H) 11.4 - 15.2 seconds   INR 1.33   Urinalysis, Routine w reflex microscopic (not at [facility])     Status: Abnormal   Collection Time: 06/01/16  9:33 PM  Result Value Ref Range   Color, Urine AMBER (A) YELLOW    Comment: BIOCHEMICALS MAY BE AFFECTED BY COLOR   APPearance CLEAR CLEAR   Specific Gravity, Urine 1.020 1.005 - 1.030   pH 6.0 5.0 - 8.0   Glucose, UA NEGATIVE  NEGATIVE mg/dL   Hgb urine dipstick NEGATIVE NEGATIVE   Bilirubin Urine NEGATIVE NEGATIVE   Ketones, ur 15 (A) NEGATIVE mg/dL   Protein, ur 30 (A) NEGATIVE mg/dL   Nitrite NEGATIVE NEGATIVE   Leukocytes, UA SMALL (A) NEGATIVE  Urine microscopic-add on     Status: Abnormal   Collection Time: 06/01/16  9:33 PM  Result Value Ref Range   Squamous Epithelial / LPF 0-5 (A) NONE SEEN   WBC, UA 6-30 0 - 5 WBC/hpf   RBC / HPF NONE SEEN 0 - 5 RBC/hpf   Bacteria, UA RARE (A) NONE SEEN  Basic metabolic panel     Status: Abnormal   Collection Time: 06/02/16  2:40 AM  Result Value Ref Range   Sodium 122 (L) 135 - 145 mmol/L   Potassium 3.7 3.5 - 5.1 mmol/L   Chloride 90 (L) 101 - 111 mmol/L   CO2 22 22 - 32 mmol/L   Glucose, Bld 89 65 - 99 mg/dL   BUN 6 6 - 20 mg/dL   Creatinine, Ser 0.56 0.44 - 1.00 mg/dL   Calcium 8.4 (L) 8.9 - 10.3 mg/dL   GFR calc non Af Amer >60 >60 mL/min   GFR calc Af Amer >60 >60 mL/min    Comment: (NOTE) The eGFR has been calculated using the CKD EPI equation. This calculation has not been validated in all clinical situations. eGFR's persistently  <60 mL/min signify possible Chronic Kidney Disease.    Anion gap 10 5 - 15  Osmolality     Status: Abnormal   Collection Time: 06/02/16  2:40 AM  Result Value Ref Range   Osmolality 253 (L) 275 - 295 mOsm/kg  TSH     Status: None   Collection Time: 06/02/16  2:40 AM  Result Value Ref Range   TSH 1.404 0.350 - 4.500 uIU/mL  APTT     Status: None   Collection Time: 06/02/16  2:40 AM  Result Value Ref Range   aPTT 35 24 - 36 seconds  Heparin level (unfractionated)     Status: Abnormal   Collection Time: 06/02/16  2:40 AM  Result Value Ref Range   Heparin Unfractionated 1.82 (H) 0.30 - 0.70 IU/mL    Comment:        IF HEPARIN RESULTS ARE BELOW EXPECTED VALUES, AND PATIENT DOSAGE HAS BEEN CONFIRMED, SUGGEST FOLLOW UP TESTING OF ANTITHROMBIN III LEVELS. RESULTS CONFIRMED BY MANUAL DILUTION   Sodium, urine, random     Status: None   Collection Time: 06/02/16  4:02 AM  Result Value Ref Range   Sodium, Ur 20 mmol/L  Osmolality, urine     Status: Abnormal   Collection Time: 06/02/16  4:03 AM  Result Value Ref Range   Osmolality, Ur 211 (L) 300 - 900 mOsm/kg  Basic metabolic panel     Status: Abnormal   Collection Time: 06/02/16  6:40 AM  Result Value Ref Range   Sodium 125 (L) 135 - 145 mmol/L   Potassium 3.6 3.5 - 5.1 mmol/L   Chloride 90 (L) 101 - 111 mmol/L   CO2 25 22 - 32 mmol/L   Glucose, Bld 98 65 - 99 mg/dL   BUN 5 (L) 6 - 20 mg/dL   Creatinine, Ser 0.64 0.44 - 1.00 mg/dL   Calcium 8.7 (L) 8.9 - 10.3 mg/dL   GFR calc non Af Amer >60 >60 mL/min   GFR calc Af Amer >60 >60 mL/min    Comment: (NOTE) The eGFR has been  calculated using the CKD EPI equation. This calculation has not been validated in all clinical situations. eGFR's persistently <60 mL/min signify possible Chronic Kidney Disease.    Anion gap 10 5 - 15  Hepatic function panel     Status: Abnormal   Collection Time: 06/02/16  6:40 AM  Result Value Ref Range   Total Protein 6.2 (L) 6.5 - 8.1 g/dL    Albumin 3.4 (L) 3.5 - 5.0 g/dL   AST 22 15 - 41 U/L   ALT 16 14 - 54 U/L   Alkaline Phosphatase 60 38 - 126 U/L   Total Bilirubin 2.7 (H) 0.3 - 1.2 mg/dL   Bilirubin, Direct 0.4 0.1 - 0.5 mg/dL   Indirect Bilirubin 2.3 (H) 0.3 - 0.9 mg/dL  CBC     Status: Abnormal   Collection Time: 06/02/16  6:40 AM  Result Value Ref Range   WBC 7.4 4.0 - 10.5 K/uL   RBC 4.11 3.87 - 5.11 MIL/uL   Hemoglobin 12.5 12.0 - 15.0 g/dL   HCT 00.7 62.2 - 63.3 %   MCV 92.5 78.0 - 100.0 fL   MCH 30.4 26.0 - 34.0 pg   MCHC 32.9 30.0 - 36.0 g/dL   RDW 35.4 56.2 - 56.3 %   Platelets 137 (L) 150 - 400 K/uL  Heparin level (unfractionated)     Status: Abnormal   Collection Time: 06/02/16  1:19 PM  Result Value Ref Range   Heparin Unfractionated 0.94 (H) 0.30 - 0.70 IU/mL    Comment:        IF HEPARIN RESULTS ARE BELOW EXPECTED VALUES, AND PATIENT DOSAGE HAS BEEN CONFIRMED, SUGGEST FOLLOW UP TESTING OF ANTITHROMBIN III LEVELS.   APTT     Status: Abnormal   Collection Time: 06/02/16  1:19 PM  Result Value Ref Range   aPTT 50 (H) 24 - 36 seconds    Comment:        IF BASELINE aPTT IS ELEVATED, SUGGEST PATIENT RISK ASSESSMENT BE USED TO DETERMINE APPROPRIATE ANTICOAGULANT THERAPY.   APTT     Status: Abnormal   Collection Time: 06/02/16 10:51 PM  Result Value Ref Range   aPTT 79 (H) 24 - 36 seconds    Comment:        IF BASELINE aPTT IS ELEVATED, SUGGEST PATIENT RISK ASSESSMENT BE USED TO DETERMINE APPROPRIATE ANTICOAGULANT THERAPY.   Heparin level (unfractionated)     Status: Abnormal   Collection Time: 06/02/16 10:51 PM  Result Value Ref Range   Heparin Unfractionated 0.92 (H) 0.30 - 0.70 IU/mL    Comment:        IF HEPARIN RESULTS ARE BELOW EXPECTED VALUES, AND PATIENT DOSAGE HAS BEEN CONFIRMED, SUGGEST FOLLOW UP TESTING OF ANTITHROMBIN III LEVELS.   CBC     Status: Abnormal   Collection Time: 06/03/16  5:50 AM  Result Value Ref Range   WBC 4.8 4.0 - 10.5 K/uL   RBC 3.72 (L) 3.87 -  5.11 MIL/uL   Hemoglobin 11.1 (L) 12.0 - 15.0 g/dL   HCT 89.3 (L) 73.4 - 28.7 %   MCV 94.4 78.0 - 100.0 fL   MCH 29.8 26.0 - 34.0 pg   MCHC 31.6 30.0 - 36.0 g/dL   RDW 68.1 15.7 - 26.2 %   Platelets 135 (L) 150 - 400 K/uL  Comprehensive metabolic panel     Status: Abnormal   Collection Time: 06/03/16  5:50 AM  Result Value Ref Range   Sodium 134 (L) 135 - 145  mmol/L    Comment: DELTA CHECK NOTED   Potassium 3.2 (L) 3.5 - 5.1 mmol/L   Chloride 98 (L) 101 - 111 mmol/L   CO2 21 (L) 22 - 32 mmol/L   Glucose, Bld 65 65 - 99 mg/dL   BUN 5 (L) 6 - 20 mg/dL   Creatinine, Ser 0.67 0.44 - 1.00 mg/dL   Calcium 8.5 (L) 8.9 - 10.3 mg/dL   Total Protein 5.8 (L) 6.5 - 8.1 g/dL   Albumin 3.0 (L) 3.5 - 5.0 g/dL   AST 19 15 - 41 U/L   ALT 15 14 - 54 U/L   Alkaline Phosphatase 50 38 - 126 U/L   Total Bilirubin 2.2 (H) 0.3 - 1.2 mg/dL   GFR calc non Af Amer >60 >60 mL/min   GFR calc Af Amer >60 >60 mL/min    Comment: (NOTE) The eGFR has been calculated using the CKD EPI equation. This calculation has not been validated in all clinical situations. eGFR's persistently <60 mL/min signify possible Chronic Kidney Disease.    Anion gap 15 5 - 15  Heparin level (unfractionated)     Status: None   Collection Time: 06/03/16  5:50 AM  Result Value Ref Range   Heparin Unfractionated 0.65 0.30 - 0.70 IU/mL    Comment:        IF HEPARIN RESULTS ARE BELOW EXPECTED VALUES, AND PATIENT DOSAGE HAS BEEN CONFIRMED, SUGGEST FOLLOW UP TESTING OF ANTITHROMBIN III LEVELS.   APTT     Status: Abnormal   Collection Time: 06/03/16  5:50 AM  Result Value Ref Range   aPTT 100 (H) 24 - 36 seconds    Comment:        IF BASELINE aPTT IS ELEVATED, SUGGEST PATIENT RISK ASSESSMENT BE USED TO DETERMINE APPROPRIATE ANTICOAGULANT THERAPY.     Radiology/Results: Dg Chest 2 View  Result Date: 06/01/2016 CLINICAL DATA:  Right upper abdominal pain EXAM: CHEST  2 VIEW COMPARISON:  02/14/2016 FINDINGS:  Cardiomediastinal silhouette is stable. Again noted small right pleural effusion with right basilar atelectasis. Surgical clips in right axilla again noted. No infiltrate or pulmonary edema. Hyperinflation again noted. IMPRESSION: No active cardiopulmonary disease. Again noted hyperinflation. Stable small right pleural effusion with right basilar atelectasis. Electronically Signed   By: Lahoma Crocker M.D.   On: 06/01/2016 22:08   US Abdomen Complete  Result Date: 06/01/2016 CLINICAL DATA:  Initial evaluation for acute right upper quadrant abdominal pain. EXAM: ABDOMEN ULTRASOUND COMPLETE COMPARISON:  Prior CT from 11/04/2015. FINDINGS: Gallbladder: No gallstones or wall thickening visualized. No sonographic Murphy sign noted by sonographer. Common bile duct: Diameter: 4.5 mm Liver: Multiple small cystic lesions noted within liver, grossly similar to previous CT. Largest of these was positioned in the right lobe it measured 2.4 x 1.7 x 1.9 cm Liver demonstrates a nodular contour, suggesting cirrhosis. IVC: No abnormality visualized. Pancreas: Visualized portion unremarkable. Spleen: Size and appearance within normal limits. Right Kidney: Length: 11.3 cm. Increased echogenicity. No hydronephrosis. 0.8 x 0.6 x 0.7 cyst noted. Left Kidney: Length: 11.4 cm. Increased echogenicity. No mass or hydronephrosis visualized. Abdominal aorta: No aneurysm visualized. Atherosclerotic calcifications noted. Other findings: Right pleural effusion. IMPRESSION: 1. Normal sonographic evaluation of the gallbladder. 2. Nodular contour of the liver, suggestive of cirrhosis. Several scattered cystic lesions within the liver, grossly similar relative to prior CT. 3. Increased echogenicity within the kidneys, compatible with medical renal disease. No hydronephrosis. 4. Right pleural effusion. Electronically Signed   By: Pincus Badder.D.  On: 06/01/2016 23:13   Ct Abdomen Pelvis W Contrast  Result Date: 06/02/2016 CLINICAL DATA:   Abdominal pain EXAM: CT ABDOMEN AND PELVIS WITH CONTRAST TECHNIQUE: Multidetector CT imaging of the abdomen and pelvis was performed using the standard protocol following bolus administration of intravenous contrast. CONTRAST:  1 ISOVUE-300 IOPAMIDOL (ISOVUE-300) INJECTION 61% COMPARISON:  11/04/2015 FINDINGS: Lower chest and abdominal wall: Small right pleural effusion, partially loculated. Patient has had interval right lower lobe surgery. Hepatobiliary: Lobulated liver surface compatible with cirrhosis. There scattered hepatic cysts and small hypervascular areas that favor shunts. Transient hypoperfusion in segment 3. Chronic prominence of the extrahepatic biliary tree without visualized stone or mass. Pancreas: Unremarkable. Spleen: Unremarkable. Adrenals/Urinary Tract: Negative adrenals. Numerous small presumed right renal cyst. No hydronephrosis or ureteral calculus. Visualization of the entire bladder is limited by streak artifact from hip prostheses. No acute finding. Stomach/Bowel: Thickened small bowel loops in the right lower quadrant from submucosal edema, with avid mucosal and serosal enhancement. Central to these loops is extraluminal gas surrounding a blind-ending structure extending from a more inferior small bowel loop, most convincing on coronal reformats. Findings consistent with a perforated small bowel diverticulum, presumably Meckel's diverticulum in this location. No evidence of foreign body. The appendix is not discretely seen; the inflamed bowel is in close continuity to the cecum but does not appear directly contiguous with it. Colonic diverticulosis. Reproductive:Hysterectomy. Vascular/Lymphatic: No acute vascular abnormality. No mass or adenopathy. Other: No ascites or pneumoperitoneum. Musculoskeletal: No acute finding. Degenerative lumbar dextroscoliosis. Remote T12 compression fracture. Bilateral total hip arthroplasty. These results were called by telephone at the time of  interpretation on 06/02/2016 at 1:09 am to Dr. Lily Kocher , who verbally acknowledged these results. IMPRESSION: 1. 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 No abscess. 2. Small postoperative partially loculated right pleural effusion. 3.  Cirrhosis of liver. ([id number]-U04.54) 4.  Aortic Atherosclerosis (ICD10-170.0) Electronically Signed   By: Monte Fantasia M.D.   On: 06/02/2016 01:15    Anti-infectives: Anti-infectives    Start     Dose/Rate Route Frequency Ordered Stop   06/02/16 0200  piperacillin-tazobactam (ZOSYN) IVPB 3.375 g     3.375 g 12.5 mL/hr over 240 Minutes Intravenous Every 8 hours 06/02/16 0152        Assessment/Plan: Problem List: Patient Active Problem List   Diagnosis Date Noted  . Abdominal pain 06/02/2016  . Nausea 06/02/2016  . 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 Sinus pause 01/17/2016  . Essential hypertension   . S/P lobectomy of lung 01/05/2016  . 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 Long term (current) use of anticoagulants 05/09/2013  . Osteoporosis 05/09/2013  . Constipation 05/08/2013  . Hip fracture, left ([place]) 04/19/2013  . Hyponatremia 04/19/2013  . Atrial fibrillation, chronic ([place]) 04/19/2013  . Lung nodule 04/19/2013  . Cellulitis of arm, right 06/05/2011  . Hypertension 01/13/2007  . OSTEOARTHRITIS 01/13/2007  . OSTEOPENIA 01/13/2007    Doing well with contained perforation of the Meckel's diverticulum * No surgery found *    LOS: 1 day   Matt B. Hassell Done, MD, [facility] [phone] beeper [phone]  06/03/2016 10:29 AM

## 2016-06-04 MED ORDER — PIPERACILLIN-TAZOBACTAM 3.375 G IVPB
3.3750 g | Freq: Three times a day (TID) | INTRAVENOUS | Status: DC
  Administered 2016-06-04 – 2016-06-06 (×5): 3.375 g via INTRAVENOUS
  Filled 2016-06-04 (×9): qty 50

## 2016-06-04 NOTE — Progress Notes (Signed)
  Progress Note: General Surgery Service   Subjective: Pain resolved, +BM overnight  Objective: Vital signs in last 24 hours: Temp:  [97.5 F (36.4 C)-99.4 F (37.4 C)] 98.3 F (36.8 C) (08/21 0900) Pulse Rate:  [73-86] 73 (08/21 0900) Resp:  [18-19] 18 (08/21 0900) BP: (125-138)/(60-68) 138/68 (08/21 0900) SpO2:  [96 %-99 %] 99 % (08/21 0900) Weight:  [58.3 kg (128 lb 9.6 oz)] 58.3 kg (128 lb 9.6 oz) (08/20 2026) Last BM Date: 06/03/16  Intake/Output from previous day: 08/20 0701 - 08/21 0700 In: 1460.8 [I.V.:1410.8; IV Piggyback:50] Out: 2100 [Urine:2100] Intake/Output this shift: No intake/output data recorded.  Lungs: CTAB  Cardiovascular: irreg irreg  Abd: soft, slight pain on palpation in RLQ, otherwise benign  Extremities: no edema, bruising left arm  Neuro: AOx4  Lab Results: CBC   Recent Labs  06/02/16 0640 06/03/16 0550  WBC 7.4 4.8  HGB 12.5 11.1*  HCT 38.0 35.1*  PLT 137* 135*   BMET  Recent Labs  06/02/16 0640 06/03/16 0550  NA 125* 134*  K 3.6 3.2*  CL 90* 98*  CO2 25 21*  GLUCOSE 98 65  BUN 5* 5*  CREATININE 0.64 0.67  CALCIUM 8.7* 8.5*   PT/INR  Recent Labs  06/01/16 2133  LABPROT 16.5*  INR 1.33   ABG No results for input(s): PHART, HCO3 in the last 72 hours.  Invalid input(s): PCO2, PO2  Studies/Results:  Anti-infectives: Anti-infectives    Start     Dose/Rate Route Frequency Ordered Stop   06/02/16 0200  piperacillin-tazobactam (ZOSYN) IVPB 3.375 g     3.375 g 12.5 mL/hr over 240 Minutes Intravenous Every 8 hours 06/02/16 0152        Medications: Scheduled Meds: . enoxaparin (LOVENOX) injection  60 mg Subcutaneous Q12H  . piperacillin-tazobactam (ZOSYN)  IV  3.375 g Intravenous Q8H  . sodium chloride flush  3 mL Intravenous Q12H   Continuous Infusions: . 0.9 % NaCl with KCl 20 mEq / L 75 mL/hr at 06/04/16 0301   PRN Meds:.acetaminophen **OR** acetaminophen, diltiazem, hydrALAZINE, hydroxypropyl  methylcellulose / hypromellose, ondansetron **OR** ondansetron (ZOFRAN) IV  Assessment/Plan: Patient Active Problem List   Diagnosis Date Noted  . Abdominal pain 06/02/2016  . Nausea 06/02/2016  . 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 Sinus pause 01/17/2016  . Essential hypertension   . S/P lobectomy of lung 01/05/2016  . 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 Long term (current) use of anticoagulants 05/09/2013  . Osteoporosis 05/09/2013  . Constipation 05/08/2013  . Hip fracture, left ([place]) 04/19/2013  . Hyponatremia 04/19/2013  . Atrial fibrillation, chronic ([place]) 04/19/2013  . Lung nodule 04/19/2013  . Cellulitis of arm, right 06/05/2011  . Hypertension 01/13/2007  . OSTEOARTHRITIS 01/13/2007  . OSTEOPENIA 01/13/2007   Concern for meckel's diverticulum perforation. Improved with antibiotics -advance to clears if improves clinically may avoid surgery -continue broad abx   LOS: 2 days   Mickeal Skinner, MD Pg# [phone] [facility]

## 2016-06-04 NOTE — Progress Notes (Signed)
PROGRESS NOTE    [name]  ZJI:[id number]  DOB: 1934/03/07  DOA: 06/01/2016 PCP: Marton Redwood, MD Outpatient Specialists:  Hospital course: 82 y.o. 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  She had a diaphragmatic cyst removed during that surgery as well.  She feels that she has recovered will from lung surgery and was in her baseline state of health until Wednesday when she developed acute right sided abdominal pain.  Assessment & Plan:    Perforated ileal diverticulum with regional enteritis --General surgery consultation greatly appreciated --clears diet started.  --Hold Eliquis in the event that she has to go to the OR; will anticoagulate with unfractionated heparin infusion when appropriate --Analgesics and antiemetics as needed --Empiric IV zosyn per pharmacy --clinically seems to be getting better, appreciate general surgery following, hopefully can start p.o. diet tomorrow  Isolated elevation of bilirubin, abnormal liver appearance concerning for cirrhosis --Consider GI consult outpatient eval.  Hyponatremia --improved with IVF  Hypokalemia  - replete in IVF and oral, checking magnesium   Chronic atrial fibrillation --Telemetry monitoring --Holding Eliquis in case she has to to go the OR --enoxaparin for anticoagulation  HTN --IV hydralazine prn   DVT prophylaxis: FULL anticoagulation Code Status: FULL Family Communication: none at bedside Disposition Plan: To be determined Consults called: General Surgery, Rosendo Gros  Procedures:  none  Antimicrobials: Anti-infectives    Start     Dose/Rate Route Frequency Ordered Stop   06/02/16 0200  piperacillin-tazobactam (ZOSYN) IVPB 3.375 g     3.375 g 12.5 mL/hr over 240 Minutes Intravenous  Every 8 hours 06/02/16 0152       Subjective: Pt says that she had a BM.  No n/v and no severe abdominal pain like she had on admission.   Objective: Vitals:   06/03/16 1748 06/03/16 2026 06/04/16 0419 06/04/16 0900  BP: 136/60 125/62 132/64 138/68  Pulse: 78 79 86 73  Resp: '18 19 18 18  '$ Temp: 97.5 F (36.4 C) 99.4 F (37.4 C) 98 F (36.7 C) 98.3 F (36.8 C)  TempSrc: Oral Oral Oral Oral  SpO2: 99% 97% 96% 99%  Weight:  58.3 kg (128 lb 9.6 oz)    Height:        Intake/Output Summary (Last 24 hours) at 06/04/16 1411 Last data filed at 06/04/16 1359  Gross per 24 hour  Intake          1214.75 ml  Output             1850 ml  Net          -635.25 ml   Filed Weights   06/02/16 0421 06/02/16 2056 06/03/16 2026  Weight: 62.3 kg (137 lb 6.4 oz) 59 kg (130 lb) 58.3 kg (128 lb 9.6 oz)    Exam:  General exam: awake, alert appears comfortable, nAD.  Respiratory system: Clear. No increased work of breathing. Cardiovascular system: S1 & S2 heard,  Gastrointestinal system: Abdomen is nondistended, soft hypoactive BS heard. Central nervous system: Alert and oriented. No focal neurological deficits. Extremities: no CCE.  Data Reviewed: Basic Metabolic Panel:  Recent Labs Lab 06/01/16 2133 06/02/16 0240 06/02/16 0640 06/03/16 0550  NA 120* 122* 125* 134*  K 3.9 3.7 3.6 3.2*  CL 87* 90* 90* 98*  CO2 '23 22 25 '$ 21*  GLUCOSE 109* 89 98 65  BUN 8 6 5* 5*  CREATININE 0.61 0.56 0.64 0.67  CALCIUM 8.7* 8.4* 8.7* 8.5*   Liver Function Tests:  Recent Labs Lab 06/01/16 2133 06/02/16 0640 06/03/16 0550  AST '29 22 19  '$ ALT '16 16 15  '$ ALKPHOS 64 60 50  BILITOT 3.8* 2.7* 2.2*  PROT 7.2 6.2* 5.8*  ALBUMIN 3.9 3.4* 3.0*    Recent Labs Lab 06/01/16 2133  LIPASE 32   No results for input(s): AMMONIA in the last 168 hours. CBC:  Recent Labs Lab 06/01/16 2133 06/02/16 0640 06/03/16 0550  WBC 9.6 7.4 4.8  HGB 13.4 12.5 11.1*  HCT 40.1 38.0 35.1*  MCV 92.2 92.5 94.4    PLT 163 137* 135*   Cardiac Enzymes: No results for input(s): CKTOTAL, CKMB, CKMBINDEX, TROPONINI in the last 168 hours. CBG (last 3)  No results for input(s): GLUCAP in the last 72 hours. No results found for this or any previous visit (from the past 240 hour(s)).   Studies: No results found.   Scheduled Meds: . enoxaparin (LOVENOX) injection  60 mg Subcutaneous Q12H  . piperacillin-tazobactam (ZOSYN)  IV  3.375 g Intravenous Q8H  . sodium chloride flush  3 mL Intravenous Q12H   Continuous Infusions: . 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

## 2016-06-05 LAB — CBC
HEMATOCRIT: 38.7 % (ref 36.0–46.0)
HEMOGLOBIN: 12.5 g/dL (ref 12.0–15.0)
MCH: 30.2 pg (ref 26.0–34.0)
MCHC: 32.3 g/dL (ref 30.0–36.0)
MCV: 93.5 fL (ref 78.0–100.0)
Platelets: 140 10*3/uL — ABNORMAL LOW (ref 150–400)
RBC: 4.14 MIL/uL (ref 3.87–5.11)
RDW: 13.8 % (ref 11.5–15.5)
WBC: 4.8 10*3/uL (ref 4.0–10.5)

## 2016-06-05 LAB — BASIC METABOLIC PANEL
ANION GAP: 15 (ref 5–15)
CALCIUM: 8.9 mg/dL (ref 8.9–10.3)
CHLORIDE: 100 mmol/L — AB (ref 101–111)
CO2: 17 mmol/L — AB (ref 22–32)
Creatinine, Ser: 0.68 mg/dL (ref 0.44–1.00)
GFR calc Af Amer: >60 mL/min (ref >60)
GFR calc non Af Amer: >60 mL/min (ref >60)
GLUCOSE: 70 mg/dL (ref 65–99)
POTASSIUM: 3.5 mmol/L (ref 3.5–5.1)
Sodium: 132 mmol/L — ABNORMAL LOW (ref 135–145)

## 2016-06-05 LAB — MAGNESIUM: Magnesium: 1.5 mg/dL — ABNORMAL LOW (ref 1.7–2.4)

## 2016-06-05 MED ORDER — CARVEDILOL 6.25 MG PO TABS
6.2500 mg | ORAL_TABLET | Freq: Two times a day (BID) | ORAL | Status: DC
  Administered 2016-06-05 – 2016-06-06 (×2): 6.25 mg via ORAL
  Filled 2016-06-05 (×2): qty 1

## 2016-06-05 MED ORDER — MAGNESIUM SULFATE 2 GM/50ML IV SOLN
2.0000 g | Freq: Once | INTRAVENOUS | Status: AC
  Administered 2016-06-05: 2 g via INTRAVENOUS
  Filled 2016-06-05: qty 50

## 2016-06-05 MED ORDER — APIXABAN 5 MG PO TABS
5.0000 mg | ORAL_TABLET | Freq: Two times a day (BID) | ORAL | Status: DC
Start: 2016-06-05 — End: 2016-06-06
  Administered 2016-06-05 – 2016-06-06 (×2): 5 mg via ORAL
  Filled 2016-06-05 (×2): qty 1

## 2016-06-05 MED ORDER — IRBESARTAN 150 MG PO TABS
150.0000 mg | ORAL_TABLET | Freq: Every day | ORAL | Status: DC
  Administered 2016-06-05 – 2016-06-06 (×2): 150 mg via ORAL
  Filled 2016-06-05 (×2): qty 1

## 2016-06-05 NOTE — Progress Notes (Signed)
[facility] Progress Note     Subjective: Sitting up in bed, eating clear liquid breakfast. Abdominal tenderness improving - No abdominal pain currently. Tenderness only noticable with palpation of RLQ. Denies nausea or vomiting. 4 BMs yesterday that were loose and brown. Denies melena, hematochezia, or puss in stool.    Objective: Vital signs in last 24 hours: Temp:  [98 F (36.7 C)-98.4 F (36.9 C)] 98 F (36.7 C) (08/22 0612) Pulse Rate:  [83-95] 88 (08/22 0612) Resp:  [18-19] 19 (08/22 0612) BP: (144-162)/(81-87) 144/81 (08/22 0612) SpO2:  [98 %-99 %] 98 % (08/22 0612) Weight:  [57.1 kg (125 lb 14.1 oz)] 57.1 kg (125 lb 14.1 oz) (08/21 2024) Last BM Date: 06/04/16  Intake/Output from previous day: 08/21 0701 - 08/22 0700 In: 1010 [P.O.:960; IV Piggyback:50] Out: 1700 [Urine:1700] Intake/Output this shift: No intake/output data recorded.  PE: Gen:  Alert, NAD, pleasant Card: irregularly irregular  Pulm:  CTA, no W/R/R Abd: Soft, minimal tenderness to deep palpation of RLQ, ND, +BS Ext:  No edema  Lab Results:   Recent Labs  06/03/16 0550 06/05/16 0423  WBC 4.8 4.8  HGB 11.1* 12.5  HCT 35.1* 38.7  PLT 135* 140*   BMET  Recent Labs  06/03/16 0550 06/05/16 0423  NA 134* 132*  K 3.2* 3.5  CL 98* 100*  CO2 21* 17*  GLUCOSE 65 70  BUN 5* <5*  CREATININE 0.67 0.68  CALCIUM 8.5* 8.9   PT/INR No results for input(s): LABPROT, INR in the last 72 hours. CMP     Component Value Date/Time   NA 132 (L) 06/05/2016 0423   K 3.5 06/05/2016 0423   CL 100 (L) 06/05/2016 0423   CO2 17 (L) 06/05/2016 0423   GLUCOSE 70 06/05/2016 0423   BUN <5 (L) 06/05/2016 0423   CREATININE 0.68 06/05/2016 0423   CALCIUM 8.9 06/05/2016 0423   PROT 5.8 (L) 06/03/2016 0550   ALBUMIN 3.0 (L) 06/03/2016 0550   AST 19 06/03/2016 0550   ALT 15 06/03/2016 0550   ALKPHOS 50 06/03/2016 0550   BILITOT 2.2 (H) 06/03/2016 0550   GFRNONAA >60 06/05/2016 0423   GFRAA >60  06/05/2016 0423   Lipase     Component Value Date/Time   LIPASE 32 06/01/2016 2133   Studies/Results: No results found.  Anti-infectives: Anti-infectives    Start     Dose/Rate Route Frequency Ordered Stop   06/04/16 2030  piperacillin-tazobactam (ZOSYN) IVPB 3.375 g     3.375 g 12.5 mL/hr over 240 Minutes Intravenous Every 8 hours 06/04/16 2001     06/02/16 0200  piperacillin-tazobactam (ZOSYN) IVPB 3.375 g  Status:  Discontinued     3.375 g 12.5 mL/hr over 240 Minutes Intravenous Every 8 hours 06/02/16 0152 06/04/16 2001     Assessment/Plan Concern for meckle's diverticulum with perforation - improved on IV abx  FEN: advance to full liquid diet  ID: zosyn day #4, continue per pharmacy DVT Proph: Lovenox (eloquis held)  Plan: If patient tolerates a full liquid diet, she may be discharged home on Augmentin. Recommend a total of 10 days of antibiotic treatment.    LOS: 3 days    [facility] 06/05/2016, 9:33 AM Pager: [phone] Consults: [phone] Mon-Fri 7:00 am-4:30 pm Sat-Sun 7:00 am-11:30 am

## 2016-06-05 NOTE — Progress Notes (Addendum)
PROGRESS NOTE    [name]  ZOX:[id number]  DOB: 12/24/1933  DOA: 06/01/2016 PCP: Marton Redwood, MD Outpatient Specialists:  Hospital course: 82 y.o. 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  She had a diaphragmatic cyst removed during that surgery as well.  She feels that she has recovered will from lung surgery and was in her baseline state of health until Wednesday when she developed acute right sided abdominal pain.  Assessment & Plan:    Perforated ileal diverticulum with regional enteritis --General surgery consultation greatly appreciated --clears diet tolerated, advancing to full liquid diet 8/22. Plan possible discharge home 8/23.  --Hold Eliquis in the event that she has to go to the OR; will anticoagulate with lovenox for now, transition back to eliquis at discharge --Analgesics and antiemetics as needed --Empiric IV zosyn per pharmacy, plan to switch to augmentin 8/23.   --clinically seems to be getting better   Isolated elevation of bilirubin, abnormal liver appearance concerning for cirrhosis --Order for a GI outpatient evaluation at discharge.    Hyponatremia --improved with IVF  Hypokalemia  - repleted in IVF and orally.    Hypomagnesemia - Replete IVPB 8/22  Chronic atrial fibrillation --Telemetry monitoring --Holding Eliquis in case she had to to go the OR, resume at discharge, possibly 8/23 --enoxaparin for anticoagulation  HTN --resume home oral meds 8/22 now that she has tolerated clears.   DVT prophylaxis: FULL anticoagulation Code Status: FULL Family Communication: none at bedside Disposition Plan: Plan home likely 8/23 Consults called: General Surgery, Rosendo Gros  Procedures:  none  Antimicrobials: Anti-infectives    Start      Dose/Rate Route Frequency Ordered Stop   06/04/16 2030  piperacillin-tazobactam (ZOSYN) IVPB 3.375 g     3.375 g 12.5 mL/hr over 240 Minutes Intravenous Every 8 hours 06/04/16 2001     06/02/16 0200  piperacillin-tazobactam (ZOSYN) IVPB 3.375 g  Status:  Discontinued     3.375 g 12.5 mL/hr over 240 Minutes Intravenous Every 8 hours 06/02/16 0152 06/04/16 2001     Subjective: Pt says that she is feeling better today.  She did tolerate clears and her diet has been advanced to full liquids.    Objective: Vitals:   06/04/16 1840 06/04/16 2024 06/05/16 0612 06/05/16 0958  BP: (!) 159/81 (!) 162/87 (!) 144/81 124/74  Pulse: 95 83 88 80  Resp: '18 18 19 20  '$ Temp: 98.4 F (36.9 C) 98.2 F (36.8 C) 98 F (36.7 C) 98.2 F (36.8 C)  TempSrc: Oral Oral Oral Oral  SpO2: 99% 98% 98% 98%  Weight:  57.1 kg (125 lb 14.1 oz)    Height:        Intake/Output Summary (Last 24 hours) at 06/05/16 1503 Last data filed at 06/05/16 1232  Gross per 24 hour  Intake             1010 ml  Output             2250 ml  Net            -1240 ml   Filed Weights   06/02/16 2056 06/03/16 2026 06/04/16 2024  Weight: 59 kg (130 lb) 58.3 kg (128 lb 9.6 oz) 57.1 kg (125 lb 14.1 oz)    Exam:  General exam: awake, alert appears comfortable,  nAD.  Respiratory system: Clear. No increased work of breathing. Cardiovascular system: S1 & S2 heard,  Gastrointestinal system: Abdomen is nondistended, soft hypoactive BS heard. Central nervous system: Alert and oriented. No focal neurological deficits. Extremities: no CCE.  Data Reviewed: Basic Metabolic Panel:  Recent Labs Lab 06/01/16 2133 06/02/16 0240 06/02/16 0640 06/03/16 0550 06/05/16 0423  NA 120* 122* 125* 134* 132*  K 3.9 3.7 3.6 3.2* 3.5  CL 87* 90* 90* 98* 100*  CO2 '23 22 25 '$ 21* 17*  GLUCOSE 109* 89 98 65 70  BUN 8 6 5* 5* <5*  CREATININE 0.61 0.56 0.64 0.67 0.68  CALCIUM 8.7* 8.4* 8.7* 8.5* 8.9  MG  --   --   --   --  1.5*   Liver  Function Tests:  Recent Labs Lab 06/01/16 2133 06/02/16 0640 06/03/16 0550  AST '29 22 19  '$ ALT '16 16 15  '$ ALKPHOS 64 60 50  BILITOT 3.8* 2.7* 2.2*  PROT 7.2 6.2* 5.8*  ALBUMIN 3.9 3.4* 3.0*    Recent Labs Lab 06/01/16 2133  LIPASE 32   No results for input(s): AMMONIA in the last 168 hours. CBC:  Recent Labs Lab 06/01/16 2133 06/02/16 0640 06/03/16 0550 06/05/16 0423  WBC 9.6 7.4 4.8 4.8  HGB 13.4 12.5 11.1* 12.5  HCT 40.1 38.0 35.1* 38.7  MCV 92.2 92.5 94.4 93.5  PLT 163 137* 135* 140*   Cardiac Enzymes: No results for input(s): CKTOTAL, CKMB, CKMBINDEX, TROPONINI in the last 168 hours. CBG (last 3)  No results for input(s): GLUCAP in the last 72 hours. No results found for this or any previous visit (from the past 240 hour(s)).   Studies: No results found.  Scheduled Meds: . enoxaparin (LOVENOX) injection  60 mg Subcutaneous Q12H  . piperacillin-tazobactam (ZOSYN)  IV  3.375 g Intravenous Q8H  . sodium chloride flush  3 mL Intravenous Q12H   Continuous Infusions:   Principal Problem:   Meckel's diverticulum perforation August 2017 Active Problems:   Hypertension   Hyponatremia   Atrial fibrillation, chronic (HCC)   Long term (current) use of anticoagulants   Abdominal pain   Nausea  Time spent:   Irwin Brakeman, MD, FAAFP [facility] Pager [phone]  If 7PM-7AM, please contact night-coverage [email or web address] Password [email or web address] 06/05/2016, 3:03 PM    LOS: 3 days

## 2016-06-05 NOTE — Care Management Important Message (Signed)
Important Message  Patient Details  Name: [name] MRN: [id number] Date of Birth: 01-Jun-1934   Medicare Important Message Given:  Yes    Orbie Pyo 06/05/2016, 10:29 AM

## 2016-06-05 NOTE — Progress Notes (Signed)
Pharmacy Antibiotic Note [name] is a 82 y.o. female admitted on 06/01/2016 with abdominal pain and concern for meckel's diverticulum perforation.  Currently on day 4 of Zosyn for treatment.   Plan: - Continue Zosyn 3.375 grams IV every 8 hours (infused over 4 hour time period)  Height: 5' 1.25" (155.6 cm) Weight: 125 lb 14.1 oz (57.1 kg) IBW/kg (Calculated) : 48.38  Temp (24hrs), Avg:98.2 F (36.8 C), Min:98 F (36.7 C), Max:98.4 F (36.9 C)   Recent Labs Lab 06/01/16 2133 06/02/16 0240 06/02/16 0640 06/03/16 0550 06/05/16 0423  WBC 9.6  --  7.4 4.8 4.8  CREATININE 0.61 0.56 0.64 0.67 0.68    Estimated Creatinine Clearance: 43.6 mL/min (by C-G formula based on SCr of 0.8 mg/dL).    Allergies  Allergen Reactions  . Aspirin Rash    Antimicrobials this admission: 8/19 Zosyn >>    Microbiology results: n/a  Thank you for allowing pharmacy to be a part of this patient's care.  Vincenza Hews, PharmD, BCPS 06/05/2016, 8:19 AM Pager: [phone]

## 2016-06-06 DIAGNOSIS — R1011 Right upper quadrant pain: Secondary | ICD-10-CM

## 2016-06-06 DIAGNOSIS — Q430 Meckel's diverticulum (displaced) (hypertrophic): Secondary | ICD-10-CM

## 2016-06-06 DIAGNOSIS — I482 Chronic atrial fibrillation: Secondary | ICD-10-CM

## 2016-06-06 DIAGNOSIS — Z7901 Long term (current) use of anticoagulants: Secondary | ICD-10-CM

## 2016-06-06 DIAGNOSIS — I10 Essential (primary) hypertension: Secondary | ICD-10-CM

## 2016-06-06 DIAGNOSIS — R110 Nausea: Secondary | ICD-10-CM

## 2016-06-06 DIAGNOSIS — E871 Hypo-osmolality and hyponatremia: Secondary | ICD-10-CM

## 2016-06-06 LAB — BASIC METABOLIC PANEL
ANION GAP: 9 (ref 5–15)
BUN: <5 mg/dL — ABNORMAL LOW (ref 6–20)
CO2: 24 mmol/L (ref 22–32)
Calcium: 8.8 mg/dL — ABNORMAL LOW (ref 8.9–10.3)
Chloride: 95 mmol/L — ABNORMAL LOW (ref 101–111)
Creatinine, Ser: 0.66 mg/dL (ref 0.44–1.00)
GFR calc Af Amer: >60 mL/min (ref >60)
GLUCOSE: 119 mg/dL — AB (ref 65–99)
POTASSIUM: 3.3 mmol/L — AB (ref 3.5–5.1)
Sodium: 128 mmol/L — ABNORMAL LOW (ref 135–145)

## 2016-06-06 LAB — CBC
HEMATOCRIT: 40.1 % (ref 36.0–46.0)
Hemoglobin: 13.3 g/dL (ref 12.0–15.0)
MCH: 30.2 pg (ref 26.0–34.0)
MCHC: 33.2 g/dL (ref 30.0–36.0)
MCV: 90.9 fL (ref 78.0–100.0)
Platelets: 143 10*3/uL — ABNORMAL LOW (ref 150–400)
RBC: 4.41 MIL/uL (ref 3.87–5.11)
RDW: 13.7 % (ref 11.5–15.5)
WBC: 3.4 10*3/uL — AB (ref 4.0–10.5)

## 2016-06-06 LAB — MAGNESIUM: Magnesium: 1.7 mg/dL (ref 1.7–2.4)

## 2016-06-06 MED ORDER — AMOXICILLIN-POT CLAVULANATE 875-125 MG PO TABS: 1.0000 | ORAL_TABLET | Freq: Two times a day (BID) | ORAL | 0 refills | Status: DC

## 2016-06-06 MED ORDER — POTASSIUM CHLORIDE CRYS ER 20 MEQ PO TBCR
40.0000 meq | EXTENDED_RELEASE_TABLET | Freq: Once | ORAL | Status: AC
  Administered 2016-06-06: 40 meq via ORAL
  Filled 2016-06-06: qty 2

## 2016-06-06 MED ORDER — MAGNESIUM SULFATE 2 GM/50ML IV SOLN
2.0000 g | Freq: Once | INTRAVENOUS | Status: AC
  Administered 2016-06-06: 2 g via INTRAVENOUS
  Filled 2016-06-06: qty 50

## 2016-06-06 NOTE — Discharge Summary (Signed)
Physician Discharge Summary  [name] JGG:[id number] DOB: 1934-09-18 DOA: 06/01/2016  PCP: Marton Redwood, MD  Admit date: 06/01/2016 Discharge date: 06/06/2016  Time spent: 45 minutes  Recommendations for Outpatient Follow-up:  Patient will be discharged to home.  Patient will need to follow up with primary care provider within one week of discharge, repeat BMP and magnesium.  Follow up with gastroenterology. Patient should continue medications as prescribed.  Patient should follow a soft diet.    Discharge Diagnoses:  Perforated ileal diverticulum with regional enteritis Isolated elevation of bilirubin, abnormal liver appearance concerning for cirrhosis Hyponatremia Hypokalemia  Hypomagnesemia Chronic atrial fibrillation Essential Hypertension  Discharge Condition: Stable  Diet recommendation: soft   Filed Weights   06/03/16 2026 06/04/16 2024 06/05/16 2055  Weight: 58.3 kg (128 lb 9.6 oz) 57.1 kg (125 lb 14.1 oz) 55.8 kg (123 lb)    History of present illness:  On8/19/2017 by Dr. Lily Kocher [name] is a 82 y.o. 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  She had a diaphragmatic cyst removed during that surgery as well.  She feels that she has recovered will from lung surgery and was in her baseline state of health until Wednesday when she developed acute right sided abdominal pain.  She has had mild nausea but no significant vomiting.  No diarrhea.  No weight loss.  No feer.  No lower urinary tract symptoms.  She presented to her PCP for evaluation of the abdominal pain and basic labs were drawn.  She was notified to present to the ED today for further evaluation and management of a low sodium level.  Hospital Course:  Perforated ileal diverticulum with regional  enteritis -General surgery consultation greatly appreciated -Diet has been advance, currently tolerating soft diet -Surgery recommended 10 days of Augmentin  -Was on zosyn during hospitalization    Isolated elevation of bilirubin, abnormal liver appearance concerning for cirrhosis -needs outpatient follow up at discharge  Hyponatremia -Improved with IVF, currently 132 -Repeat BMP in one week -TSH 1.404  Hypokalemia  -Potassium today 3.3, repleted  -Repeat BMP in one week  Hypomagnesemia -Given IV repletion  -magnesium 1.7 -Check magnesium in one week  Chronic atrial fibrillation -CHADSVASC >4 -Eliquis was held in case patient needed surgery -She was placed on lovenox -Resume Eliquis at discharge  Essential Hypertension -Continue home medicaitons  Procedures: None  Consultations: None  Discharge Exam: Vitals:   06/06/16 0609 06/06/16 0923  BP: 133/69 123/74  Pulse: 82 77  Resp: 18 18  Temp: 98.1 F (36.7 C) 97.6 F (36.4 C)     General: Well developed, well nourished, NAD, appears stated age  HEENT: NCAT,  mucous membranes moist.  Neck: Supple, no JVD, no masses  Cardiovascular: S1 S2 auscultated,  Irregular   Respiratory: Clear to auscultation bilaterally with equal chest rise  Abdomen: Soft, nontender, nondistended, + bowel sounds  Extremities: warm dry without cyanosis clubbing or edema  Neuro: AAOx3, nonfocal  Psych: Normal affect and demeanor with intact judgement and insight  Discharge Instructions Discharge Instructions    Discharge instructions    Complete by:  As directed   Patient will be discharged to home.  Patient will need to follow up with primary care provider within one week of discharge, repeat BMP and magnesium.  Follow up  with gastroenterology. Patient should continue medications as prescribed.  Patient should follow a soft diet.     Current Discharge Medication List    START taking these medications   Details    amoxicillin-clavulanate (AUGMENTIN) 875-125 MG tablet Take 1 tablet by mouth 2 (two) times daily. Qty: 20 tablet, Refills: 0      CONTINUE these medications which have NOT CHANGED   Details  acetaminophen (TYLENOL) 500 MG tablet Take 2 tablets (1,000 mg total) by mouth every 6 (six) hours as needed for mild pain. Qty: 30 tablet, Refills: 0    amLODipine (NORVASC) 5 MG tablet Take 5 mg by mouth daily.    apixaban (ELIQUIS) 5 MG TABS tablet Take 5 mg by mouth 2 (two) times daily.    Calcium Carbonate-Vitamin D (CALTRATE 600+D) 600-400 MG-UNIT per tablet Take 1 tablet by mouth daily.    carboxymethylcellulose (REFRESH PLUS) 0.5 % SOLN Place 1 drop into both eyes 2 (two) times daily as needed (for dry eyes).    Ergocalciferol (VITAMIN D2) 400 units TABS Take 400 Units by mouth daily.    Estradiol (VAGIFEM) 10 MCG TABS vaginal tablet Place 1 tablet (10 mcg total) vaginally 2 (two) times a week. Qty: 24 tablet, Refills: 3    Multiple Vitamin (MULTIVITAMIN WITH MINERALS) TABS Take 1 tablet by mouth every evening.    Omega-3 Fatty Acids (FISH OIL) 1000 MG CAPS Take 1 capsule by mouth 2 (two) times daily.    telmisartan (MICARDIS) 80 MG tablet Take 80 mg by mouth daily.    carvedilol (COREG) 6.25 MG tablet Take 1 tablet (6.25 mg total) by mouth 2 (two) times daily with a meal. Qty: 60 tablet, Refills: 1       Allergies  Allergen Reactions  . Aspirin Rash   Follow-up Information    [facility]. Schedule an appointment as soon as possible for a visit in 1 month(s).   Specialty:  Gastroenterology Why:  Establish Care to evaluate liver  Contact information: [place] [address] [phone]       Marton Redwood, MD. Schedule an appointment as soon as possible for a visit in 1 week(s).   Specialty:  Internal Medicine Why:  Hospital follow up Contact information: [address] [place]  [address] [phone]             The results of significant diagnostics from this hospitalization (including imaging, microbiology, ancillary and laboratory) are listed below for reference.    Significant Diagnostic Studies: Dg Chest 2 View  Result Date: 06/01/2016 CLINICAL DATA:  Right upper abdominal pain EXAM: CHEST  2 VIEW COMPARISON:  02/14/2016 FINDINGS: Cardiomediastinal silhouette is stable. Again noted small right pleural effusion with right basilar atelectasis. Surgical clips in right axilla again noted. No infiltrate or pulmonary edema. Hyperinflation again noted. IMPRESSION: No active cardiopulmonary disease. Again noted hyperinflation. Stable small right pleural effusion with right basilar atelectasis. Electronically Signed   By: Lahoma Crocker M.D.   On: 06/01/2016 22:08   US Abdomen Complete  Result Date: 06/01/2016 CLINICAL DATA:  Initial evaluation for acute right upper quadrant abdominal pain. EXAM: ABDOMEN ULTRASOUND COMPLETE COMPARISON:  Prior CT from 11/04/2015. FINDINGS: Gallbladder: No gallstones or wall thickening visualized. No sonographic Murphy sign noted by sonographer. Common bile duct: Diameter: 4.5 mm Liver: Multiple small cystic lesions noted within liver, grossly similar to previous CT. Largest of these was positioned in the right lobe it measured 2.4 x 1.7 x 1.9 cm Liver demonstrates a  nodular contour, suggesting cirrhosis. IVC: No abnormality visualized. Pancreas: Visualized portion unremarkable. Spleen: Size and appearance within normal limits. Right Kidney: Length: 11.3 cm. Increased echogenicity. No hydronephrosis. 0.8 x 0.6 x 0.7 cyst noted. Left Kidney: Length: 11.4 cm. Increased echogenicity. No mass or hydronephrosis visualized. Abdominal aorta: No aneurysm visualized. Atherosclerotic calcifications noted. Other findings: Right pleural effusion. IMPRESSION: 1. Normal sonographic evaluation of the gallbladder. 2. Nodular contour of the liver, suggestive of cirrhosis. Several scattered cystic  lesions within the liver, grossly similar relative to prior CT. 3. Increased echogenicity within the kidneys, compatible with medical renal disease. No hydronephrosis. 4. Right pleural effusion. Electronically Signed   By: Jeannine Boga M.D.   On: 06/01/2016 23:13   Ct Abdomen Pelvis W Contrast  Result Date: 06/02/2016 CLINICAL DATA:  Abdominal pain EXAM: CT ABDOMEN AND PELVIS WITH CONTRAST TECHNIQUE: Multidetector CT imaging of the abdomen and pelvis was performed using the standard protocol following bolus administration of intravenous contrast. CONTRAST:  1 ISOVUE-300 IOPAMIDOL (ISOVUE-300) INJECTION 61% COMPARISON:  11/04/2015 FINDINGS: Lower chest and abdominal wall: Small right pleural effusion, partially loculated. Patient has had interval right lower lobe surgery. Hepatobiliary: Lobulated liver surface compatible with cirrhosis. There scattered hepatic cysts and small hypervascular areas that favor shunts. Transient hypoperfusion in segment 3. Chronic prominence of the extrahepatic biliary tree without visualized stone or mass. Pancreas: Unremarkable. Spleen: Unremarkable. Adrenals/Urinary Tract: Negative adrenals. Numerous small presumed right renal cyst. No hydronephrosis or ureteral calculus. Visualization of the entire bladder is limited by streak artifact from hip prostheses. No acute finding. Stomach/Bowel: Thickened small bowel loops in the right lower quadrant from submucosal edema, with avid mucosal and serosal enhancement. Central to these loops is extraluminal gas surrounding a blind-ending structure extending from a more inferior small bowel loop, most convincing on coronal reformats. Findings consistent with a perforated small bowel diverticulum, presumably Meckel's diverticulum in this location. No evidence of foreign body. The appendix is not discretely seen; the inflamed bowel is in close continuity to the cecum but does not appear directly contiguous with it. Colonic  diverticulosis. Reproductive:Hysterectomy. Vascular/Lymphatic: No acute vascular abnormality. No mass or adenopathy. Other: No ascites or pneumoperitoneum. Musculoskeletal: No acute finding. Degenerative lumbar dextroscoliosis. Remote T12 compression fracture. Bilateral total hip arthroplasty. These results were called by telephone at the time of interpretation on 06/02/2016 at 1:09 am to Dr. Lily Kocher , who verbally acknowledged these results. IMPRESSION: 1. 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 No abscess. 2. Small postoperative partially loculated right pleural effusion. 3.  Cirrhosis of liver. ([id number]) 4.  Aortic Atherosclerosis (ICD10-170.0) Electronically Signed   By: Monte Fantasia M.D.   On: 06/02/2016 01:15    Microbiology: No results found for this or any previous visit (from the past 240 hour(s)).   Labs: Basic Metabolic Panel:  Recent Labs Lab 06/02/16 0240 06/02/16 0640 06/03/16 0550 06/05/16 0423 06/06/16 1008  NA 122* 125* 134* 132* 128*  K 3.7 3.6 3.2* 3.5 3.3*  CL 90* 90* 98* 100* 95*  CO2 22 25 21* 17* 24  GLUCOSE 89 98 65 70 119*  BUN 6 5* 5* <5* <5*  CREATININE 0.56 0.64 0.67 0.68 0.66  CALCIUM 8.4* 8.7* 8.5* 8.9 8.8*  MG  --   --   --  1.5* 1.7   Liver Function Tests:  Recent Labs Lab 06/01/16 2133 06/02/16 0640 06/03/16 0550  AST '29 22 19  '$ ALT '16 16 15  '$ ALKPHOS 64 60 50  BILITOT 3.8*  2.7* 2.2*  PROT 7.2 6.2* 5.8*  ALBUMIN 3.9 3.4* 3.0*    Recent Labs Lab 06/01/16 2133  LIPASE 32   No results for input(s): AMMONIA in the last 168 hours. CBC:  Recent Labs Lab 06/01/16 2133 06/02/16 0640 06/03/16 0550 06/05/16 0423 06/06/16 0731  WBC 9.6 7.4 4.8 4.8 3.4*  HGB 13.4 12.5 11.1* 12.5 13.3  HCT 40.1 38.0 35.1* 38.7 40.1  MCV 92.2 92.5 94.4 93.5 90.9  PLT 163 137* 135* 140* 143*   Cardiac Enzymes: No results for input(s): CKTOTAL, CKMB, CKMBINDEX, TROPONINI in the last 168  hours. BNP: BNP (last 3 results) No results for input(s): BNP in the last 8760 hours.  ProBNP (last 3 results) No results for input(s): PROBNP in the last 8760 hours.  CBG: No results for input(s): GLUCAP in the last 168 hours.     SignedCristal Ford  [facility] 06/06/2016, 11:44 AM

## 2016-06-06 NOTE — Progress Notes (Signed)
Patient ID: [name], female   DOB: 1934/08/22, 82 y.o.   MRN: [id number]  [facility] Surgery Progress Note     Subjective: Feeling great, ready to go home. Denies any current abdominal pain. Tolerating full liquid diet. Last BM this morning.  Objective: Vital signs in last 24 hours: Temp:  [97.7 F (36.5 C)-98.3 F (36.8 C)] 98.1 F (36.7 C) (08/23 0609) Pulse Rate:  [64-82] 82 (08/23 0609) Resp:  [17-20] 18 (08/23 0609) BP: (115-133)/(66-74) 133/69 (08/23 0609) SpO2:  [97 %-99 %] 97 % (08/23 0609) Weight:  [123 lb (55.8 kg)] 123 lb (55.8 kg) (08/22 2055) Last BM Date: 06/05/16  Intake/Output from previous day: 08/22 0701 - 08/23 0700 In: 0092 [P.O.:1440; IV Piggyback:150] Out: 2600 [Urine:2600] Intake/Output this shift: No intake/output data recorded.  PE: Gen:  Alert, NAD, pleasant Card: irregularly irregular  Pulm:  CTAB Abd: Soft, NT/ND, +BS Ext:  No edema  Lab Results:   Recent Labs  06/05/16 0423 06/06/16 0731  WBC 4.8 3.4*  HGB 12.5 13.3  HCT 38.7 40.1  PLT 140* 143*   BMET  Recent Labs  06/05/16 0423  NA 132*  K 3.5  CL 100*  CO2 17*  GLUCOSE 70  BUN <5*  CREATININE 0.68  CALCIUM 8.9   PT/INR No results for input(s): LABPROT, INR in the last 72 hours. CMP     Component Value Date/Time   NA 132 (L) 06/05/2016 0423   K 3.5 06/05/2016 0423   CL 100 (L) 06/05/2016 0423   CO2 17 (L) 06/05/2016 0423   GLUCOSE 70 06/05/2016 0423   BUN <5 (L) 06/05/2016 0423   CREATININE 0.68 06/05/2016 0423   CALCIUM 8.9 06/05/2016 0423   PROT 5.8 (L) 06/03/2016 0550   ALBUMIN 3.0 (L) 06/03/2016 0550   AST 19 06/03/2016 0550   ALT 15 06/03/2016 0550   ALKPHOS 50 06/03/2016 0550   BILITOT 2.2 (H) 06/03/2016 0550   GFRNONAA >60 06/05/2016 0423   GFRAA >60 06/05/2016 0423   Lipase     Component Value Date/Time   LIPASE 32 06/01/2016 2133       Studies/Results: No results found.  Anti-infectives: Anti-infectives    Start      Dose/Rate Route Frequency Ordered Stop   06/04/16 2030  piperacillin-tazobactam (ZOSYN) IVPB 3.375 g     3.375 g 12.5 mL/hr over 240 Minutes Intravenous Every 8 hours 06/04/16 2001     06/02/16 0200  piperacillin-tazobactam (ZOSYN) IVPB 3.375 g  Status:  Discontinued     3.375 g 12.5 mL/hr over 240 Minutes Intravenous Every 8 hours 06/02/16 0152 06/04/16 2001       Assessment/Plan Concern for meckle's diverticulum with perforation - improved on IV abx  FEN: full liquid diet advance as tolerated ID: zosyn day #5, continue per pharmacy DVT Proph: Lovenox (eloquis held, plan to restart at discharge)  Plan: advance diet as tolerated. Ready to go home from general surgery standpoint. Recommend d/c on 10 days of augmentin. No CCS follow-up needed. Follow-up with OP internal medicine.   LOS: 4 days    Jerrye Beavers , [facility] Surgery 06/06/2016, 8:29 AM Pager: [phone] Consults: [phone] Mon-Fri 7:00 am-4:30 pm Sat-Sun 7:00 am-11:30 am

## 2016-06-06 NOTE — Progress Notes (Signed)
PT Cancellation and Discharge Note  Patient Details Name: KYREN VAUX MRN: [id number] DOB: 10/19/1933   Cancelled Treatment:    Reason Eval/Treat Not Completed: PT screened, no needs identified, will sign off   Discussed case with Lambert Keto, RN, who told me she is walking independently in the hallways, and will be discharging today;     Roney Marion [facility] 06/06/2016, 12:31 PM

## 2016-06-06 NOTE — Progress Notes (Signed)
[name] to be D/C'd Home per MD order.  Carlyn Reichert RN Discussed prescriptions and follow up appointments with the patient. Prescriptions given to patient, medication list explained in detail. Pt verbalized understanding.    Medication List    TAKE these medications   acetaminophen 500 MG tablet Commonly known as:  TYLENOL Take 2 tablets (1,000 mg total) by mouth every 6 (six) hours as needed for mild pain.   amLODipine 5 MG tablet Commonly known as:  NORVASC Take 5 mg by mouth daily.   amoxicillin-clavulanate 875-125 MG tablet Commonly known as:  AUGMENTIN Take 1 tablet by mouth 2 (two) times daily.   CALTRATE 600+D 600-400 MG-UNIT tablet Generic drug:  Calcium Carbonate-Vitamin D Take 1 tablet by mouth daily.   carboxymethylcellulose 0.5 % Soln Commonly known as:  REFRESH PLUS Place 1 drop into both eyes 2 (two) times daily as needed (for dry eyes).   carvedilol 6.25 MG tablet Commonly known as:  COREG Take 1 tablet (6.25 mg total) by mouth 2 (two) times daily with a meal. What changed:  Another medication with the same name was removed. Continue taking this medication, and follow the directions you see here.   ELIQUIS 5 MG Tabs tablet Generic drug:  apixaban Take 5 mg by mouth 2 (two) times daily.   Estradiol 10 MCG Tabs vaginal tablet Commonly known as:  VAGIFEM Place 1 tablet (10 mcg total) vaginally 2 (two) times a week.   Fish Oil 1000 MG Caps Take 1 capsule by mouth 2 (two) times daily.   multivitamin with minerals Tabs tablet Take 1 tablet by mouth every evening.   telmisartan 80 MG tablet Commonly known as:  MICARDIS Take 80 mg by mouth daily.   Vitamin D2 400 units Tabs Take 400 Units by mouth daily.       Vitals:   06/06/16 0609 06/06/16 0923  BP: 133/69 123/74  Pulse: 82 77  Resp: 18 18  Temp: 98.1 F (36.7 C) 97.6 F (36.4 C)    Skin clean, dry and intact without evidence of skin break down, no evidence of skin tears noted. IV catheter  discontinued intact. Site without signs and symptoms of complications. Dressing and pressure applied. Pt denies pain at this time. No complaints noted.  An After Visit Summary was printed and given to the patient. Patient escorted via [place], and D/C home via private auto.  Haywood Lasso BSN, RN [facility] Phone [phone]

## 2016-06-06 NOTE — Discharge Instructions (Signed)
Meckel Diverticulum Meckel diverticulum is an abnormal pouch in your small intestine. It is usually present since birth (congenital abnormality). The small intestine is the part of the digestive tract that receives food from your stomach. A Meckel diverticulum forms in the lower part of the small intestine.  During pregnancy, a duct connects the small intestine to the cord that nourishes and supports a developing baby (umbilical cord). Normally, this duct disconnects and disappears after the second month of pregnancy. If part of the duct remains attached to the small intestine, it can form a Meckel diverticulum pouch. In some cases, there may also be abnormal tissues in the pouch. These tissues may be similar to tissues in your pancreas or stomach.  CAUSES  Meckel diverticulum is caused by a duct defect in the small intestine.  SIGNS AND SYMPTOMS  In most people, Meckel diverticulum never causes symptoms. Symptoms occur when the diverticulum causes bleeding or inflammation. Symptoms can also result if there is a perforation (hole) or obstruction (blockage) in the intestine. Symptoms may include:  Stomach pain.  Nausea and vomiting.  Constipation.  Fever.  Bloating.  Bloody stools. DIAGNOSIS  Your health care provider may diagnose Meckel diverticulum during an imaging study you are having done for another digestive problem. Your health care provider may suspect Meckel diverticulum if you have signs and symptoms of the condition. Your health care provider will do a physical exam. This may include imaging tests, such as:  A technetium scan (Meckel scan). In this test, the chemical element technetium is injected into your bloodstream. Then you have a type of X-ray to check whether the technetium fills the Meckel diverticulum pouch.  A CT scan. TREATMENT  Treatment of Meckel diverticulum depends on your symptoms. If you do not have symptoms, treatment may not be necessary. If you have bleeding,  your health care provider may recommend surgery to remove the diverticulum and possibly a small section of your small intestine.  SEEK MEDICAL CARE IF:  You have a fever.  You become constipated.  Your symptoms get worse.  You develop new symptoms. SEEK IMMEDIATE MEDICAL CARE IF:   You have severe abdominal pain.  You have persistent vomiting.  You have dark or bright red blood in your stools.   This information is not intended to replace advice given to you by your health care provider. Make sure you discuss any questions you have with your health care provider.   Document Released: 06/26/2001 Document Revised: 10/22/2014 Document Reviewed: 06/02/2014 Elsevier Interactive Patient Education [employer].

## 2016-07-04 ENCOUNTER — Ambulatory Visit: Payer: Medicare Other | Admitting: Nurse Practitioner

## 2016-07-06 ENCOUNTER — Encounter: Payer: Self-pay | Admitting: Nurse Practitioner

## 2016-07-06 NOTE — Progress Notes (Signed)
Patient ID: [name], female   DOB: [birth year], 82 y.o.   MRN: [id number]  82 y.o. [id number] Widowed  Caucasian Fe here for annual exam.  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 Due for Reclast 10/17.  Patient's last menstrual period was 10/16/1975 (approximate).          Sexually active: No.  The current method of family planning is abstinence.    Exercising: Yes.    walking  Smoker:  no  Health Maintenance: Pap: 05/10/09, Negative, hysterectomy MMG:04/18/16, 3D, Bi-Rads 1:  Negative  Colonoscopy: 01/2014, small polyp, no repeat due to age unless problems BMD:07/13/15, T Score: 1.8 Right Radius / -1.7 Left Radius Dr. Brigitte Pulse does order for Reclast TDaP: 2011 Pneumovax:12/16/01 Shingles: 07/02/06 Hep C and HIV: Not indicated due to age Labs: PCP takes care of all labs   reports that she has never smoked. She has never used smokeless tobacco. She reports that she does not drink alcohol or use drugs.  Past Medical History:  Diagnosis Date  . Arthritis 1996  . Atrial fibrillation (HCC)    on coumadin  . Atrial tachycardia ([place])   . Cancer [facility]) 1988   breast cancer, R mastectomy, chemo  . 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 Diverticulitis 1999  . Femur fracture ([place]) 03/2013   left, non weight bearing for 2 1/2 weeks  . GERD (gastroesophageal reflux disease)   . Hemorrhoids 1991  . HTN (hypertension)   . Lymphedema of upper extremity    right arm  . Osteoporosis   . PONV (postoperative nausea and vomiting)   . Urinary, incontinence, stress female   . Vitamin D deficiency disease     Past Surgical History:  Procedure Laterality Date  . APPENDECTOMY    . BREAST BIOPSY  05/04/1987   Right Dr Annamaria Boots  . BREAST IMPLANT REMOVAL Right 06/05/10  . BUNIONECTOMY  1988  . carcinoid Right    Right lower lobectomy 2017  . COLONOSCOPY W/  POLYPECTOMY    . EYE SURGERY  5/14 ; 6/14   Cataract Surgery  . JOINT REPLACEMENT     2002 left; 2009 right Hip, rt knee 2012  . KNEE ARTHROPLASTY Right 2012   [facility]  . LOBECTOMY Right 01/05/2016   Procedure: RIGHT LOWER LOBE LOBECTOMY;  Surgeon: Melrose Nakayama, MD;  Location: [place];  Service: Thoracic;  Laterality: Right;  . MASTECTOMY PARTIAL / LUMPECTOMY W/ AXILLARY LYMPHADENECTOMY  04/05/1987   Right - Dr Annamaria Boots  . RESECTION OF MEDIASTINAL MASS N/A 01/05/2016   Procedure: RESECTION OF PLEURAL MASS, right;  Surgeon: Melrose Nakayama, MD;  Location: [place];  Service: Thoracic;  Laterality: N/A;  . TONSILLECTOMY    . TOTAL HIP ARTHROPLASTY    . TUBAL LIGATION Bilateral 1970  . VAGINAL HYSTERECTOMY  1977   myomata  . VIDEO ASSISTED THORACOSCOPY (VATS)/WEDGE RESECTION Right 01/05/2016   Procedure: RIGHT VIDEO ASSISTED THORACOSCOPY (VATS)/WEDGE RESECTION;  Surgeon: Melrose Nakayama, MD;  Location: [place];  Service: Thoracic;  Laterality: Right;    Current Outpatient Prescriptions  Medication Sig Dispense Refill  . acetaminophen (TYLENOL) 500 MG tablet Take 2 tablets (1,000 mg total) by mouth every 6 (six) hours as needed for mild pain. 30 tablet 0  . amLODipine (NORVASC) 5 MG tablet Take 5 mg by mouth daily.    Marland Kitchen amoxicillin-clavulanate (AUGMENTIN)  875-125 MG tablet Take 1 tablet by mouth 2 (two) times daily. 20 tablet 0  . apixaban (ELIQUIS) 5 MG TABS tablet Take 5 mg by mouth 2 (two) times daily.    . Calcium Carbonate-Vitamin D (CALTRATE 600+D) 600-400 MG-UNIT per tablet Take 1 tablet by mouth daily.    . carboxymethylcellulose (REFRESH PLUS) 0.5 % SOLN Place 1 drop into both eyes 2 (two) times daily as needed (for dry eyes).    . carvedilol (COREG) 6.25 MG tablet Take 1 tablet (6.25 mg total) by mouth 2 (two) times daily with a meal. (Patient not taking: Reported on 06/01/2016) 60 tablet 1  . Ergocalciferol (VITAMIN D2) 400 units TABS Take 400 Units by mouth daily.    .  Estradiol (VAGIFEM) 10 MCG TABS vaginal tablet Place 1 tablet (10 mcg total) vaginally 2 (two) times a week. 24 tablet 3  . Multiple Vitamin (MULTIVITAMIN WITH MINERALS) TABS Take 1 tablet by mouth every evening.    . Omega-3 Fatty Acids (FISH OIL) 1000 MG CAPS Take 1 capsule by mouth 2 (two) times daily.    Marland Kitchen telmisartan (MICARDIS) 80 MG tablet Take 80 mg by mouth daily.     No current facility-administered medications for this visit.     Family History  Problem Relation Age of Onset  . Colon cancer Mother   . Colon cancer Father   . Heart disease Father     ROS:  Pertinent items are noted in HPI.  Otherwise, a comprehensive ROS was negative.  Exam:   LMP 10/16/1975 (Approximate)    Ht Readings from Last 3 Encounters:  06/01/16 5' 1.25" (1.556 m)  02/14/16 '5\' 1"'$  (1.549 m)  01/26/16 '5\' 1"'$  (1.549 m)    General appearance: alert, cooperative and appears stated age Head: Normocephalic, without obvious abnormality, atraumatic Neck: no adenopathy, supple, symmetrical, trachea midline and thyroid normal to inspection and palpation Lungs: clear to auscultation bilaterally Breasts: normal appearance, no masses or tenderness Heart: regular rate and rhythm Abdomen: soft, non-tender; no masses,  no organomegaly Extremities: extremities normal, atraumatic, no cyanosis or edema Skin: Skin color, texture, turgor normal. No rashes or lesions Lymph nodes: Cervical, supraclavicular, and axillary nodes normal. No abnormal inguinal nodes palpated Neurologic: Grossly normal   Pelvic: External genitalia:  no lesions              Urethra:  normal appearing urethra with no masses, tenderness or lesions              Bartholin's and Skene's: normal                 Vagina: normal appearing vagina with normal color and discharge, no lesions              Cervix: absent              Pap taken: No. Bimanual Exam:  Uterus:  uterus absent              Adnexa: no mass, fullness, tenderness                Rectovaginal: Confirms               Anus:  normal sphincter tone, no lesions  Chaperone present: no  A:  Well Woman with normal exam     S/P TVH secondary to fibroids 1977  S/P right mastectomy 04/05/1987 secondary to breast cancer with implant that was later removed 05/2010 S/P bilateral hip replacements and right knee replacement  Fall with fracture of left femur 6/14 - now on Reclast History of Osteoporosis  History of A Fib on Coumadin since 2012 Atrophic vaginitis with urinary incontinence that is stable on Vagifem Vit D deficiency  S/P RL Lobectomy secondary to lung cancer 01/05/16   P:   Reviewed health and wellness pertinent to exam  Pap smear as above  Mammogram is due 7/18  Refill on Vagifem for a year  Counseled with risk of DVT, CVA, cancer - letter from PCP about OK to be on Vagifem  Counseled on breast self exam, mammography screening, use and side effects of HRT, adequate intake of calcium and vitamin D, diet and exercise, Kegel's exercises return annually or prn  An After Visit Summary was printed and given to the patient.

## 2016-07-09 ENCOUNTER — Ambulatory Visit (INDEPENDENT_AMBULATORY_CARE_PROVIDER_SITE_OTHER): Payer: Medicare Other | Admitting: Nurse Practitioner

## 2016-07-09 ENCOUNTER — Encounter: Payer: Self-pay | Admitting: Nurse Practitioner

## 2016-07-09 VITALS — BP 144/90 | HR 68 | Ht 61.0 in | Wt 130.0 lb

## 2016-07-09 DIAGNOSIS — Z01419 Encounter for gynecological examination (general) (routine) without abnormal findings: Secondary | ICD-10-CM | POA: Diagnosis not present

## 2016-07-09 DIAGNOSIS — C3431 Malignant neoplasm of lower lobe, right bronchus or lung: Secondary | ICD-10-CM

## 2016-07-09 DIAGNOSIS — N952 Postmenopausal atrophic vaginitis: Secondary | ICD-10-CM

## 2016-07-09 DIAGNOSIS — C50411 Malignant neoplasm of upper-outer quadrant of right female breast: Secondary | ICD-10-CM | POA: Diagnosis not present

## 2016-07-09 MED ORDER — ESTRADIOL 10 MCG VA TABS: 1.0000 | ORAL_TABLET | VAGINAL | 3 refills | Status: DC

## 2016-07-09 NOTE — Patient Instructions (Signed)

## 2016-07-13 NOTE — Progress Notes (Signed)
Encounter reviewed by Dr. Deborah Lazcano Amundson C. Silva.  

## 2016-07-16 ENCOUNTER — Other Ambulatory Visit: Payer: Self-pay | Admitting: Thoracic Surgery (Cardiothoracic Vascular Surgery)

## 2016-07-16 DIAGNOSIS — C3491 Malignant neoplasm of unspecified part of right bronchus or lung: Secondary | ICD-10-CM

## 2016-07-17 ENCOUNTER — Ambulatory Visit (INDEPENDENT_AMBULATORY_CARE_PROVIDER_SITE_OTHER): Payer: Medicare Other | Admitting: Thoracic Surgery (Cardiothoracic Vascular Surgery)

## 2016-07-17 ENCOUNTER — Ambulatory Visit
Admission: RE | Admit: 2016-07-17 | Discharge: 2016-07-17 | Disposition: A | Discharge status: Home / Self Care | Payer: Medicare Other | Source: Ambulatory Visit | Attending: Thoracic Surgery (Cardiothoracic Vascular Surgery) | Admitting: Thoracic Surgery (Cardiothoracic Vascular Surgery)

## 2016-07-17 ENCOUNTER — Encounter: Payer: Self-pay | Admitting: Thoracic Surgery (Cardiothoracic Vascular Surgery)

## 2016-07-17 VITALS — BP 133/77 | HR 62 | Resp 16 | Ht 61.0 in | Wt 130.0 lb

## 2016-07-17 DIAGNOSIS — Z902 Acquired absence of lung [part of]: Secondary | ICD-10-CM

## 2016-07-17 DIAGNOSIS — R918 Other nonspecific abnormal finding of lung field: Secondary | ICD-10-CM

## 2016-07-17 DIAGNOSIS — J986 Disorders of diaphragm: Secondary | ICD-10-CM | POA: Diagnosis not present

## 2016-07-17 DIAGNOSIS — D3A00 Benign carcinoid tumor of unspecified site: Secondary | ICD-10-CM

## 2016-07-17 DIAGNOSIS — C3491 Malignant neoplasm of unspecified part of right bronchus or lung: Secondary | ICD-10-CM

## 2016-07-17 NOTE — Progress Notes (Signed)
[address]       [place] [address]             [phone]       HPI: [name] returns today for a scheduled 6 month follow-up visit.  She is an40 yo woman who underwent a thoracoscopic right lower lobectomy for a carcinoid tumor and excision of a benign diaphragmatic cyst on 01/05/2016. Her postoperative course was complicated by a prolonged air leak. She developed a lot of subcutaneous emphysema after her chest tube was removed, but did not have to have a new tube placed. She remained stable for several days and then was discharged on 01/17/2016.  I last saw her in the office in May. She was doing well at that time.  She feels well. She does not have any residual pain from her incisions. She denies chest pain, pressure, tightness, shortness of breath, and wheezing. Her appetite is good and she has not been losing weight.  Past Medical History:  Diagnosis Date  . Arthritis 1996  . Atrial fibrillation (HCC)    on coumadin  . Atrial tachycardia ([place])   . Cancer [facility]) 1988   breast cancer, R mastectomy, chemo  . 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 Diverticulitis 1999  . Femur fracture ([place]) 03/2013   left, non weight bearing for 2 1/2 weeks  . GERD (gastroesophageal reflux disease)   . Hemorrhoids 1991  . HTN (hypertension)   . Lymphedema of upper extremity    right arm  . Osteoporosis   . PONV (postoperative nausea and vomiting)   . Urinary, incontinence, stress female   . Vitamin D deficiency disease       Current Outpatient Prescriptions  Medication Sig Dispense Refill  . acetaminophen (TYLENOL) 500 MG tablet Take 2 tablets (1,000 mg total) by mouth every 6 (six) hours as needed for mild pain. 30 tablet 0  . amLODipine (NORVASC) 5 MG tablet Take 5 mg by mouth daily.    Marland Kitchen apixaban (ELIQUIS) 5 MG TABS tablet Take 5 mg by mouth 2 (two) times daily.    . Calcium Carbonate-Vitamin D (CALTRATE  600+D) 600-400 MG-UNIT per tablet Take 1 tablet by mouth daily.    . carboxymethylcellulose (REFRESH PLUS) 0.5 % SOLN Place 1 drop into both eyes 2 (two) times daily as needed (for dry eyes).    . carvedilol (COREG) 6.25 MG tablet Take 1 tablet (6.25 mg total) by mouth 2 (two) times daily with a meal. 60 tablet 1  . Ergocalciferol (VITAMIN D2) 400 units TABS Take 400 Units by mouth daily.    . Estradiol (VAGIFEM) 10 MCG TABS vaginal tablet Place 1 tablet (10 mcg total) vaginally 2 (two) times a week. 24 tablet 3  . Multiple Vitamin (MULTIVITAMIN WITH MINERALS) TABS Take 1 tablet by mouth every evening.    . Omega-3 Fatty Acids (FISH OIL) 1000 MG CAPS Take 1 capsule by mouth 2 (two) times daily.    Marland Kitchen telmisartan (MICARDIS) 80 MG tablet Take 80 mg by mouth daily.     No current facility-administered medications for this visit.     Physical Exam BP 133/77   Pulse 62   Resp 16   Ht '5\' 1"'$  (1.549 m)   Wt 130 lb (59 kg)   LMP 10/16/1975 (Approximate)   SpO2 97% Comment: RA  BMI 24.87 kg/m  82 year old woman in no acute distress Well-developed and well-nourished  Alert and oriented 3 with no focal deficits Incisions well healed Lungs clear Cardiac regular rate and rhythm  Diagnostic Tests: CHEST  2 VIEW  COMPARISON:  Chest x-ray of 06/01/2016  FINDINGS: Pleuro parenchymal opacity at the right lung base is stable consistent with postoperative change and possible right pleural effusion most likely loculated. Otherwise the lungs are clear. Cardiomegaly is stable. Surgical clips overlie the right axilla. No acute bony abnormality is seen. An old compression deformity of a lower thoracic vertebral body appears stable.  IMPRESSION: 1. Stable pleuro parenchymal opacity at the right lung base most likely postoperative in nature. Cannot exclude loculated right effusion. 2. Stable cardiomegaly.   Electronically Signed   By: Ivar Drape M.D.   On: 07/17/2016 10:22  I  personally reviewed the chest x-ray. Shows postoperative changes.  Impression: [name] is an 82 year old woman who had a right lower lobectomy for carcinoid tumor and also excision of a benign diaphragmatic cyst back in March of this year. She is now 6 months out from surgery. She does not have any pain. She only notices shortness of breath with heavy exertion. Overall she is doing well. Her chest x-ray shows no evidence of recurrent disease.  Plan: Return in 6 months with CT of chest for one year follow-up  Melrose Nakayama, MD [facility] [phone]

## 2016-07-24 ENCOUNTER — Ambulatory Visit (INDEPENDENT_AMBULATORY_CARE_PROVIDER_SITE_OTHER): Payer: Medicare Other | Admitting: Cardiology

## 2016-07-24 ENCOUNTER — Encounter: Payer: Self-pay | Admitting: Cardiology

## 2016-07-24 DIAGNOSIS — Z7901 Long term (current) use of anticoagulants: Secondary | ICD-10-CM

## 2016-07-24 DIAGNOSIS — I10 Essential (primary) hypertension: Secondary | ICD-10-CM | POA: Diagnosis not present

## 2016-07-24 DIAGNOSIS — I482 Chronic atrial fibrillation, unspecified: Secondary | ICD-10-CM

## 2016-07-24 DIAGNOSIS — C3491 Malignant neoplasm of unspecified part of right bronchus or lung: Secondary | ICD-10-CM

## 2016-07-24 DIAGNOSIS — C7A090 Malignant carcinoid tumor of the bronchus and lung: Secondary | ICD-10-CM

## 2016-07-24 NOTE — Assessment & Plan Note (Signed)
Controlled.  

## 2016-07-24 NOTE — Patient Instructions (Signed)
Medication Instructions:  Your physician recommends that you continue on your current medications as directed. Please refer to the Current Medication list given to you today.   Labwork: none  Testing/Procedures: none  Follow-Up: Follow up with Dr. Martinique as needed.   Any Other Special Instructions Will Be Listed Below (If Applicable).  Your EKG to be faxed to your Primary Care Provider - Dr. Brigitte Pulse.   If you need a refill on your cardiac medications before your next appointment, please call your pharmacy.

## 2016-07-24 NOTE — Assessment & Plan Note (Signed)
Right lower lobectomy-3/17

## 2016-07-24 NOTE — Assessment & Plan Note (Signed)
Now on Eliquis

## 2016-07-24 NOTE — Assessment & Plan Note (Signed)
CAF followed by Dr Brigitte Pulse. Seen by cardiology for the first time  01/17/16 for 2 second pauses and slow rates post VATS. She is asymptomatic.

## 2016-07-24 NOTE — Progress Notes (Signed)
07/24/2016 [name]   08-Nov-1933  [id number]  Primary Physician Katie Redwood, MD Primary Cardiologist: Dr [name]  HPI:  82 y/o female followed for many years by Dr [name] with CAF- CHADs VASc=4 for age, sex, HTN- on chronic Eliquis,  and HTN. She has no history of MI or prior cardiac evaluation.(including an echo). She was admitted for Rt VATS and lobectomy for carcinoid tumor and excision of diaphragmatic cyst on 01/05/16. She was seen by cardiology post op secondary to pauses of 2 seconds and slow AF rates noted on telemetry. We saw her back in April 2017 and she was doing well back on her medications. She is here now for a 6 month check up. She tells me she is doing well. Dr [name] just saw her for follow up a week ago. She asked if she could just see [place] PRN.    Current Outpatient Prescriptions  Medication Sig Dispense Refill  . acetaminophen (TYLENOL) 500 MG tablet Take 2 tablets (1,000 mg total) by mouth every 6 (six) hours as needed for mild pain. 30 tablet 0  . amLODipine (NORVASC) 5 MG tablet Take 5 mg by mouth daily.    Marland Kitchen apixaban (ELIQUIS) 5 MG TABS tablet Take 5 mg by mouth 2 (two) times daily.    . Calcium Carbonate-Vitamin D (CALTRATE 600+D) 600-400 MG-UNIT per tablet Take 1 tablet by mouth daily.    . carboxymethylcellulose (REFRESH PLUS) 0.5 % SOLN Place 1 drop into both eyes 2 (two) times daily as needed (for dry eyes).    . carvedilol (COREG) 6.25 MG tablet Take 1 tablet (6.25 mg total) by mouth 2 (two) times daily with a meal. 60 tablet 1  . Ergocalciferol (VITAMIN D2) 400 units TABS Take 400 Units by mouth daily.    . Estradiol (VAGIFEM) 10 MCG TABS vaginal tablet Place 1 tablet (10 mcg total) vaginally 2 (two) times a week. 24 tablet 3  . Multiple Vitamin (MULTIVITAMIN WITH MINERALS) TABS Take 1 tablet by mouth every evening.    . Omega-3 Fatty Acids (FISH OIL) 1000 MG CAPS Take 1 capsule by mouth 2 (two) times daily.    Marland Kitchen telmisartan (MICARDIS) 80 MG tablet  Take 80 mg by mouth daily.     No current facility-administered medications for this visit.     Allergies  Allergen Reactions  . Aspirin Rash    Social History   Social History  . Marital status: Widowed    Spouse name: N/A  . Number of children: N/A  . Years of education: N/A   Occupational History  . Not on file.   Social History Main Topics  . Smoking status: Never Smoker  . Smokeless tobacco: Never Used  . Alcohol use No  . Drug use: No  . Sexual activity: No     Comment: hysterectomy   Other Topics Concern  . Not on file   Social History Narrative  . No narrative on file     Review of Systems: General: negative for chills, fever, night sweats or weight changes.  Cardiovascular: negative for chest pain, dyspnea on exertion, edema, orthopnea, palpitations, paroxysmal nocturnal dyspnea or shortness of breath Dermatological: negative for rash Respiratory: negative for cough or wheezing Urologic: negative for hematuria Abdominal: negative for nausea, vomiting, diarrhea, bright red blood per rectum, melena, or hematemesis Neurologic: negative for visual changes, syncope, or dizziness All other systems reviewed and are otherwise negative except as noted above.    Blood pressure 134/77, [name] 60,  height '5\' 1"'$  (1.549 m), weight 131 lb (59.4 kg), last menstrual period 10/16/1975.  General appearance: alert, cooperative and no distress Neck: no carotid bruit and no JVD Lungs: clear to auscultation bilaterally Heart: irregularly irregular rhythm Extremities: extremities normal, atraumatic, no cyanosis or edema Skin: Skin color, texture, turgor normal. No rashes or lesions Neurologic: Grossly normal  EKG AF with CVR  ASSESSMENT AND PLAN:   Atrial fibrillation, chronic (HCC) CAF followed by Dr [name]. Seen by cardiology for the first time  01/17/16 for 2 second pauses and slow rates post VATS. She is asymptomatic.  Essential hypertension Controlled  Carcinoid  bronchial adenoma of right lung ([place]) Right lower lobectomy-3/17  Long term current use of anticoagulant therapy Now on Eliquis   PLAN  PRN cardiology follow up. We'll send her EKG done today to Dr [name].   Katie Ransom PA-C 07/24/2016 4:03 PM

## 2016-09-10 ENCOUNTER — Other Ambulatory Visit (HOSPITAL_COMMUNITY): Payer: Self-pay | Admitting: *Deleted

## 2016-09-11 ENCOUNTER — Ambulatory Visit (HOSPITAL_COMMUNITY)
Admission: RE | Admit: 2016-09-11 | Discharge: 2016-09-11 | Disposition: A | Discharge status: Home / Self Care | Payer: Medicare Other | Source: Ambulatory Visit | Attending: Internal Medicine | Admitting: Internal Medicine

## 2016-09-11 DIAGNOSIS — M810 Age-related osteoporosis without current pathological fracture: Secondary | ICD-10-CM | POA: Diagnosis present

## 2016-09-11 MED ORDER — ZOLEDRONIC ACID 5 MG/100ML IV SOLN
5.0000 mg | Freq: Once | INTRAVENOUS | Status: AC
  Administered 2016-09-11: 5 mg via INTRAVENOUS

## 2016-09-11 MED ORDER — ZOLEDRONIC ACID 5 MG/100ML IV SOLN
INTRAVENOUS | Status: AC
  Filled 2016-09-11: qty 100

## 2016-12-25 ENCOUNTER — Other Ambulatory Visit: Payer: Self-pay | Admitting: Thoracic Surgery (Cardiothoracic Vascular Surgery)

## 2016-12-25 DIAGNOSIS — Z902 Acquired absence of lung [part of]: Secondary | ICD-10-CM

## 2017-01-26 ENCOUNTER — Emergency Department (HOSPITAL_BASED_OUTPATIENT_CLINIC_OR_DEPARTMENT_OTHER)
Admission: EM | Admit: 2017-01-26 | Discharge: 2017-01-26 | Disposition: A | Discharge status: Home / Self Care | Payer: Medicare Other | Attending: Emergency Medicine | Admitting: Emergency Medicine

## 2017-01-26 ENCOUNTER — Encounter (HOSPITAL_BASED_OUTPATIENT_CLINIC_OR_DEPARTMENT_OTHER): Payer: Self-pay | Admitting: Emergency Medicine

## 2017-01-26 DIAGNOSIS — L03113 Cellulitis of right upper limb: Secondary | ICD-10-CM

## 2017-01-26 DIAGNOSIS — I10 Essential (primary) hypertension: Secondary | ICD-10-CM | POA: Diagnosis not present

## 2017-01-26 DIAGNOSIS — Z79899 Other long term (current) drug therapy: Secondary | ICD-10-CM | POA: Diagnosis not present

## 2017-01-26 DIAGNOSIS — Z853 Personal history of malignant neoplasm of breast: Secondary | ICD-10-CM | POA: Diagnosis not present

## 2017-01-26 DIAGNOSIS — M7989 Other specified soft tissue disorders: Secondary | ICD-10-CM | POA: Diagnosis present

## 2017-01-26 MED ORDER — DOXYCYCLINE HYCLATE 100 MG PO CAPS: 100.0000 mg | ORAL_CAPSULE | Freq: Two times a day (BID) | ORAL | 0 refills | Status: DC

## 2017-01-26 MED ORDER — DOXYCYCLINE HYCLATE 100 MG PO TABS
100.0000 mg | ORAL_TABLET | Freq: Once | ORAL | Status: AC
  Administered 2017-01-26: 100 mg via ORAL
  Filled 2017-01-26: qty 1

## 2017-01-26 NOTE — Discharge Instructions (Signed)
Take the antibiotic doxycycline as directed for the next 7 days. Make an appointment to follow-up with your regular Dr. for recheck in the next few days. Would expect improvement over the next 2-4 days. Return for any new or worse symptoms.

## 2017-01-26 NOTE — ED Triage Notes (Signed)
R arm swelling and pain since yesterday, hx of mastectomy on R side.

## 2017-01-26 NOTE — ED Provider Notes (Signed)
[facility] Provider Note   CSN: [id number] Arrival date & time: 01/26/17  3536     History   Chief Complaint Chief Complaint  Patient presents with  . Arm Swelling    HPI [name] is a 83 y.o. female.  Patient status post right-sided mastectomy with lymph node dissection in 1988. Patient developed cellulitis in the right arm in 2016 seen here treated with doxycycline oral antibiotic. That time they did recommend admission but patient refused. Patient states she got better on the antibiotic. Patient has a history of atrial fibrillation so she is on blood thinners. Patient states that her right arm is normally swollen. Forearm is at baseline swelling. Arm itself with a little increased swelling and has erythema which she says is consistent with a cellulitis or perhaps with a lymphadenitis. Patient denies any fever or chills patient denies feeling ill. No shortness of breath no chest pain. No headache no neck stiffness.      Past Medical History:  Diagnosis Date  . Arthritis 1996  . Atrial fibrillation (HCC)    on coumadin  . Atrial tachycardia ([place])   . Cancer [facility]) 1988   breast cancer, R mastectomy, chemo  . 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 Diverticulitis 1999  . Femur fracture ([place]) 03/2013   left, non weight bearing for 2 1/2 weeks  . GERD (gastroesophageal reflux disease)   . Hemorrhoids 1991  . HTN (hypertension)   . Lymphedema of upper extremity    right arm  . Osteoporosis   . PONV (postoperative nausea and vomiting)   . Urinary, incontinence, stress female   . Vitamin D deficiency disease     Patient Active Problem List   Diagnosis Date Noted  . Malignant neoplasm of upper-outer quadrant of right female breast ([place]) 07/09/2016  . Abdominal pain 06/02/2016  . Nausea 06/02/2016  . 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 Sinus pause 01/17/2016  . Essential hypertension    . 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 Long term current use of anticoagulant therapy 05/09/2013  . Osteoporosis 05/09/2013  . Constipation 05/08/2013  . Hip fracture, left ([place]) 04/19/2013  . Hyponatremia 04/19/2013  . Atrial fibrillation, chronic ([place]) 04/19/2013  . Lung nodule 04/19/2013  . Cellulitis of arm, right 06/05/2011  . OSTEOARTHRITIS 01/13/2007  . OSTEOPENIA 01/13/2007    Past Surgical History:  Procedure Laterality Date  . APPENDECTOMY    . BREAST BIOPSY  05/04/1987   Right Dr Annamaria Boots  . BREAST IMPLANT REMOVAL Right 06/05/10  . BUNIONECTOMY  1988  . carcinoid Right    Right lower lobectomy 2017  . COLONOSCOPY W/ POLYPECTOMY    . EYE SURGERY  5/14 ; 6/14   Cataract Surgery  . JOINT REPLACEMENT     2002 left; 2009 right Hip, rt knee 2012  . KNEE ARTHROPLASTY Right 2012   [facility]  . LOBECTOMY Right 01/05/2016   Procedure: RIGHT LOWER LOBE LOBECTOMY;  Surgeon: Melrose Nakayama, MD;  Location: [facility];  Service: Thoracic;  Laterality: Right;  . MASTECTOMY PARTIAL / LUMPECTOMY W/ AXILLARY LYMPHADENECTOMY  04/05/1987   Right - Dr Annamaria Boots  . RESECTION OF MEDIASTINAL MASS N/A 01/05/2016   Procedure: RESECTION OF PLEURAL MASS, right;  Surgeon: Melrose Nakayama, MD;  Location: [facility];  Service: Thoracic;  Laterality: N/A;  . TONSILLECTOMY    . TOTAL HIP ARTHROPLASTY    . TUBAL  LIGATION Bilateral 1970  . VAGINAL HYSTERECTOMY  1977   [place]  . VIDEO ASSISTED THORACOSCOPY (VATS)/WEDGE RESECTION Right 01/05/2016   Procedure: RIGHT VIDEO ASSISTED THORACOSCOPY (VATS)/WEDGE RESECTION;  Surgeon: Melrose Nakayama, MD;  Location: [facility];  Service: Thoracic;  Laterality: Right;    OB History    Gravida Para Term Preterm AB Living   '5 3 3 '$ 0 2 3   SAB TAB Ectopic Multiple Live Births   2 0 0 0 3       Home Medications    Prior to Admission medications   Medication Sig Start Date End Date Taking? Authorizing Provider  acetaminophen (TYLENOL)  500 MG tablet Take 2 tablets (1,000 mg total) by mouth every 6 (six) hours as needed for mild pain. 01/17/16  Yes Donielle Liston Alba, PA-C  amLODipine (NORVASC) 5 MG tablet Take 5 mg by mouth daily.   Yes Historical Provider, MD  apixaban (ELIQUIS) 5 MG TABS tablet Take 5 mg by mouth 2 (two) times daily.   Yes Historical Provider, MD  Calcium Carbonate-Vitamin D (CALTRATE 600+D) 600-400 MG-UNIT per tablet Take 1 tablet by mouth daily.   Yes Historical Provider, MD  carboxymethylcellulose (REFRESH PLUS) 0.5 % SOLN Place 1 drop into both eyes 2 (two) times daily as needed (for dry eyes).   Yes Historical Provider, MD  carvedilol (COREG) 6.25 MG tablet Take 1 tablet (6.25 mg total) by mouth 2 (two) times daily with a meal. 01/17/16  Yes Wayne E Gold, PA-C  Ergocalciferol (VITAMIN D2) 400 units TABS Take 400 Units by mouth daily.   Yes Historical Provider, MD  Estradiol (VAGIFEM) 10 MCG TABS vaginal tablet Place 1 tablet (10 mcg total) vaginally 2 (two) times a week. 07/09/16  Yes Kem Boroughs, FNP  hydrALAZINE (APRESOLINE) 25 MG tablet Take 25 mg by mouth 3 (three) times daily.   Yes Historical Provider, MD  Multiple Vitamin (MULTIVITAMIN WITH MINERALS) TABS Take 1 tablet by mouth every evening.   Yes Historical Provider, MD  Omega-3 Fatty Acids (FISH OIL) 1000 MG CAPS Take 1 capsule by mouth 2 (two) times daily.   Yes Historical Provider, MD  telmisartan (MICARDIS) 80 MG tablet Take 80 mg by mouth daily.   Yes Historical Provider, MD  doxycycline (VIBRAMYCIN) 100 MG capsule Take 1 capsule (100 mg total) by mouth 2 (two) times daily. 01/26/17   Fredia Sorrow, MD    Family History Family History  Problem Relation Age of Onset  . Colon cancer Mother   . Colon cancer Father   . Heart disease Father     Social History Social History  Substance Use Topics  . Smoking status: Never Smoker  . Smokeless tobacco: Never Used  . Alcohol use No     Allergies   Aspirin   Review of Systems Review  of Systems  Constitutional: Negative for chills and fever.  HENT: Negative for congestion.   Eyes: Negative for redness.  Respiratory: Negative for shortness of breath.   Cardiovascular: Negative for chest pain.  Gastrointestinal: Negative for abdominal pain, nausea and vomiting.  Genitourinary: Negative for hematuria.  Musculoskeletal: Negative for back pain.  Skin: Negative for rash and wound.  Hematological: Bruises/bleeds easily.  Psychiatric/Behavioral: Negative for confusion.     Physical Exam Updated Vital Signs BP 113/60 (BP Location: Left Arm)   Pulse 61   Temp 97.9 F (36.6 C) (Oral)   Resp 16   Ht '5\' 1"'$  (1.549 m)   Wt 59 kg   LMP  10/16/1975 (Approximate)   SpO2 98%   BMI 24.56 kg/m   Physical Exam  Constitutional: She is oriented to person, place, and time. She appears well-developed and well-nourished. No distress.  HENT:  Head: Normocephalic and atraumatic.  Mouth/Throat: Oropharynx is clear and moist.  Eyes: Conjunctivae and EOM are normal. Pupils are equal, round, and reactive to light.  Neck: Normal range of motion. Neck supple.  Cardiovascular: Normal rate and regular rhythm.   No murmur heard. Pulmonary/Chest: Effort normal and breath sounds normal. No respiratory distress.  Abdominal: Soft. Bowel sounds are normal. There is no tenderness.  Musculoskeletal: Normal range of motion. She exhibits edema.  Right arm with [place] swelling. Patient states not much worse than usual. Some erythema in the arm area. Other findings consistent with the old right mastectomy. Radial pulse distally 2+. Cap refill normal. No forearm erythema. Baseline swelling to right forearm.  Neurological: She is alert and oriented to person, place, and time. No cranial nerve deficit or sensory deficit. She exhibits normal muscle tone. Coordination normal.  Skin: Skin is warm.  Nursing note and vitals reviewed.    ED Treatments / Results  Labs (all labs ordered are listed, but only  abnormal results are displayed) Labs Reviewed - No data to display  EKG  EKG Interpretation None       Radiology No results found.  Procedures Procedures (including critical care time)  Medications Ordered in ED Medications  doxycycline (VIBRA-TABS) tablet 100 mg (not administered)     Initial Impression / Assessment and Plan / ED Course  I have reviewed the triage vital signs and the nursing notes.  Pertinent labs & imaging results that were available during my care of the patient were reviewed by me and considered in my medical decision making (see chart for details).     Patient with long-term swelling to right arm status post right mastectomy. Patient has had difficulty with cellulitis developing in that area in the past. Last episode was in 2016 treated with the doxycycline with success. Patient does not want admission for this. States that the oral antibiotics work fine. Patient given precautions to make sure that she gets better in the next few days and follow-up with her doctor.  Final Clinical Impressions(s) / ED Diagnoses   Final diagnoses:  Cellulitis of right upper extremity    New Prescriptions New Prescriptions   DOXYCYCLINE (VIBRAMYCIN) 100 MG CAPSULE    Take 1 capsule (100 mg total) by mouth 2 (two) times daily.     Fredia Sorrow, MD 01/26/17 1134

## 2017-01-26 NOTE — ED Notes (Signed)
R arm swelling. Hx mastectomy R

## 2017-01-26 NOTE — ED Notes (Signed)
ED Provider at bedside. 

## 2017-02-05 ENCOUNTER — Ambulatory Visit (INDEPENDENT_AMBULATORY_CARE_PROVIDER_SITE_OTHER): Payer: Medicare Other | Admitting: Thoracic Surgery (Cardiothoracic Vascular Surgery)

## 2017-02-05 ENCOUNTER — Ambulatory Visit
Admission: RE | Admit: 2017-02-05 | Discharge: 2017-02-05 | Disposition: A | Discharge status: Home / Self Care | Payer: Medicare Other | Source: Ambulatory Visit | Attending: Thoracic Surgery (Cardiothoracic Vascular Surgery) | Admitting: Thoracic Surgery (Cardiothoracic Vascular Surgery)

## 2017-02-05 ENCOUNTER — Encounter: Payer: Self-pay | Admitting: Thoracic Surgery (Cardiothoracic Vascular Surgery)

## 2017-02-05 VITALS — BP 134/76 | HR 63 | Resp 20 | Ht 61.0 in | Wt 129.0 lb

## 2017-02-05 DIAGNOSIS — D3A00 Benign carcinoid tumor of unspecified site: Secondary | ICD-10-CM

## 2017-02-05 DIAGNOSIS — Z902 Acquired absence of lung [part of]: Secondary | ICD-10-CM

## 2017-02-05 NOTE — Progress Notes (Signed)
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 She had a prolonged air leak postoperatively.  I last saw her in the office in October 2017. She was doing well that time with no pain and no significant shortness of breath.  She says she still feels well overall. She does get more short of breath with exertion. She thinks it might be due to her atrial fibrillation. She has not been able take a fluid pill because of issues with her sodium. Her blood pressure medications were recently adjusted. She has not had any unusual cough or wheezing. Her weight is stable.  Past Medical History:  Diagnosis Date  . Arthritis 1996  . Atrial fibrillation (HCC)    on coumadin  . Atrial tachycardia ([place])   . Cancer [facility]) 1988   breast cancer, R mastectomy, chemo  . 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 Diverticulitis 1999  . Femur fracture ([place]) 03/2013   left, non weight bearing for 2 1/2 weeks  . GERD (gastroesophageal reflux disease)   . Hemorrhoids 1991  . HTN (hypertension)   . Lymphedema of upper extremity    right arm  . Osteoporosis   . PONV (postoperative nausea and vomiting)   . Urinary, incontinence, stress female   . Vitamin D deficiency disease     Current Outpatient Prescriptions  Medication Sig Dispense Refill  . acetaminophen (TYLENOL) 500 MG tablet Take 2 tablets (1,000 mg total) by mouth every 6 (six) hours as needed for mild pain. 30 tablet 0  . amLODipine (NORVASC) 10 MG tablet Take 10 mg by mouth daily.    Marland Kitchen apixaban (ELIQUIS) 5 MG TABS tablet Take 5 mg by mouth 2 (two) times daily.    . Calcium Carbonate-Vitamin D (CALTRATE 600+D) 600-400 MG-UNIT per tablet Take 1 tablet by mouth daily.    . carboxymethylcellulose  (REFRESH PLUS) 0.5 % SOLN Place 1 drop into both eyes 2 (two) times daily as needed (for dry eyes).    . carvedilol (COREG) 6.25 MG tablet Take 1 tablet (6.25 mg total) by mouth 2 (two) times daily with a meal. 60 tablet 1  . doxycycline (VIBRAMYCIN) 100 MG capsule Take 1 capsule (100 mg total) by mouth 2 (two) times daily. 14 capsule 0  . Ergocalciferol (VITAMIN D2) 400 units TABS Take 400 Units by mouth daily.    . Estradiol (VAGIFEM) 10 MCG TABS vaginal tablet Place 1 tablet (10 mcg total) vaginally 2 (two) times a week. 24 tablet 3  . Multiple Vitamin (MULTIVITAMIN WITH MINERALS) TABS Take 1 tablet by mouth every evening.    . Omega-3 Fatty Acids (FISH OIL) 1000 MG CAPS Take 1 capsule by mouth 2 (two) times daily.    Marland Kitchen telmisartan (MICARDIS) 80 MG tablet Take 80 mg by mouth daily.     No current facility-administered medications for this visit.     Physical Exam BP 134/76   Pulse 63   Resp 20   Ht '5\' 1"'$  (1.549 m)   Wt 129 lb (58.5 kg)   LMP 10/16/1975 (Approximate)   SpO2 95% Comment: RA  BMI 24.75 kg/m  83 year old woman  in no acute distress Alert and oriented 3 with no focal neurologic deficits Cardiac slightly irregular Lungs diminished the right base, otherwise clear Incisions well healed  Diagnostic Tests: CT CHEST WITHOUT CONTRAST  TECHNIQUE: Multidetector CT imaging of the chest was performed following the standard protocol without IV contrast.  COMPARISON:  Multiple prior chest radiographs. Chest CT dated 04/26/2014.  FINDINGS: Cardiovascular: Heart is top-normal in size. There are dense coronary artery calcifications. Mild enlargement of the main pulmonary artery measuring 3.1 cm. Aorta is normal caliber. Mild atherosclerotic calcifications noted along the thoracic aorta.  Mediastinum/Nodes: No mediastinal or hilar masses. Several mildly prominent lymph nodes. The largest is a 13 mm subcarinal node. No hilar adenopathy. No neck base or axillary masses  or adenopathy. 7 mm low-density left thyroid nodule with an adjacent calcification.  Lungs/Pleura: There is loculated pleural fluid along the posteromedial aspect of the lower right hemithorax extending from below the level of the right hilum. There is additional right pleural fluid is noted along the margin of the lower right lung. Mild linear atelectasis or scarring is noted at the lung bases. There is focal opacity associated with a pulmonary anastomosis staple line extending from the inferior right hilum.  There are small pulmonary nodules. In the right upper lobe, there is a 5 x 3 mm, average 4 mm, nodule centered on image 22, series 4. There is a focal ground-glass nodule measuring 6 mm in the anterior right upper lobe, image 42. There is a 5 mm nodule in the left upper lobe, image 33. 3-4 mm nodule in the left lower lobe, image 72. 3 mm nodule in the left lower lobe centrally, image 67. 3-4 mm nodule, left lower lobe, image 77.  No left pleural effusion. No pneumothorax. No evidence of pneumonia or pulmonary edema.  Upper Abdomen: Several low-density liver lesions, largest in the right lobe measuring 15 mm, all likely cysts. Ill-defined thickening of the adrenal glands without a discrete mass. No acute findings.  Musculoskeletal: Mild compression fracture of L1 stable from a lateral chest radiograph dated 07/17/2016. Developmentally small disc at the T3-T4 and T4-T5 levels. No acute fracture. No osteoblastic or osteolytic lesions.  IMPRESSION: 1. No acute findings.  No evidence of pneumonia or pulmonary edema. 2. Postsurgical changes on the right from what appears to be a partial right lower lobectomy. Loculated fluid extends from the inferior right hilar region along the posteromedial aspect of the right lower hemithorax, with a small amount of non loculated right pleural fluid. 3. There are several small pulmonary nodules as detailed above. The ground-glass  nodule in the anterior right upper lobe, image 42, is new since the 2015 CT scan. All other nodules are stable. For the ground-glass nodule, recommend follow-up CT in 6 months.   Electronically Signed   By: Lajean Manes M.D.   On: 02/05/2017 11:31 I personally reviewed the CT chest and concur with the findings noted above.  Impression: [name] is an 83 year old woman who had a thoracoscopic right lower lobectomy for stage IA carcinoid tumor a year ago. She is doing well from a surgical standpoint with no residual pain. She does have some shortness of breath with exertion, which I think is multifactorial with both previous lobectomy and some atrial fibrillation.  Her CT today showed multiple small lung nodules. There is a new groundglass opacity in the right upper lobe that measures 6 mm. We will need to follow that with another CT scan in 6 months.  Plan: Return  in 6 months with CT chest to follow-up lung nodules  Melrose Nakayama, MD [facility] [phone]

## 2017-07-03 ENCOUNTER — Other Ambulatory Visit: Payer: Self-pay | Admitting: *Deleted

## 2017-07-03 DIAGNOSIS — R911 Solitary pulmonary nodule: Secondary | ICD-10-CM

## 2017-07-03 DIAGNOSIS — R918 Other nonspecific abnormal finding of lung field: Secondary | ICD-10-CM

## 2017-07-10 ENCOUNTER — Encounter: Payer: Self-pay | Admitting: Certified Nurse Midwife

## 2017-07-10 ENCOUNTER — Ambulatory Visit: Payer: Medicare Other | Admitting: Nurse Practitioner

## 2017-07-10 ENCOUNTER — Ambulatory Visit (INDEPENDENT_AMBULATORY_CARE_PROVIDER_SITE_OTHER): Payer: Medicare Other | Admitting: Certified Nurse Midwife

## 2017-07-10 VITALS — BP 118/72 | HR 64 | Resp 16 | Ht 60.25 in | Wt 125.0 lb

## 2017-07-10 DIAGNOSIS — Z01419 Encounter for gynecological examination (general) (routine) without abnormal findings: Secondary | ICD-10-CM | POA: Diagnosis not present

## 2017-07-10 DIAGNOSIS — N952 Postmenopausal atrophic vaginitis: Secondary | ICD-10-CM | POA: Diagnosis not present

## 2017-07-10 DIAGNOSIS — L298 Other pruritus: Secondary | ICD-10-CM | POA: Diagnosis not present

## 2017-07-10 DIAGNOSIS — N951 Menopausal and female climacteric states: Secondary | ICD-10-CM

## 2017-07-10 DIAGNOSIS — N898 Other specified noninflammatory disorders of vagina: Secondary | ICD-10-CM

## 2017-07-10 MED ORDER — ESTRADIOL 10 MCG VA TABS: 1.0000 | ORAL_TABLET | VAGINAL | 3 refills | Status: DC

## 2017-07-10 NOTE — Progress Notes (Signed)
83 y.o. [id number] Widowed  Caucasian Fe here for annual exam. Menopausal no HRT. Vaginal dryness slight, wearing pad for incontinence (sporadic). Some vaginal irritation, please check. Denies new personal products or incontinence, just wears pads for just in case.No change in vaginal discharge or odor. No urinary symptoms. Sees [facility] twice yearly for fluid retention/hypertension/osteoporosis management , all stable per patient.  Dr. Roxan Hockey manages nodules of her lungs , has cat scan for evaluation of 2 new ones soon. No breathing issues at this point. Still driving without issues. Eating better diet, has had some weight loss in past year, back to her normal  Now. Ambulating without issues. Goes to exercise classes weekly. No other health issues today.  Patient's last menstrual period was 10/16/1975 (approximate).          Sexually active: No.  The current method of family planning is status post hysterectomy.    Exercising: Yes.    walking and classes Smoker:  no  Health Maintenance: Pap:  05-10-09 neg History of Abnormal Pap: no MMG:  05-14-17 category a density birads 1:neg Self Breast exams: occasional Colonoscopy: 2017 [facility] - small polyp removed per patient  BMD:   2018 per patient done at [place]:  2011 Shingles: 2007 Pneumonia: 2003 Hep C and HIV: never Labs: PCP takes care of labs   reports that she has never smoked. She has never used smokeless tobacco. She reports that she does not drink alcohol or use drugs.  Past Medical History:  Diagnosis Date  . Arthritis 1996  . Atrial fibrillation (HCC)    on coumadin  . Atrial tachycardia ([place])   . Cancer [facility]) 1988   breast cancer, R mastectomy, chemo  . 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 Diverticulitis 1999  . Femur fracture ([place]) 03/2013   left, non weight bearing for 2 1/2 weeks  . GERD (gastroesophageal reflux disease)   . Hemorrhoids 1991  . HTN  (hypertension)   . Lymphedema of upper extremity    right arm  . Osteoporosis   . PONV (postoperative nausea and vomiting)   . Urinary, incontinence, stress female   . Vitamin D deficiency disease     Past Surgical History:  Procedure Laterality Date  . APPENDECTOMY    . BREAST BIOPSY  05/04/1987   Right Dr Annamaria Boots  . BREAST IMPLANT REMOVAL Right 06/05/10  . BUNIONECTOMY  1988  . carcinoid Right    Right lower lobectomy 2017  . COLONOSCOPY W/ POLYPECTOMY    . EYE SURGERY  5/14 ; 6/14   Cataract Surgery  . JOINT REPLACEMENT     2002 left; 2009 right Hip, rt knee 2012  . KNEE ARTHROPLASTY Right 2012   [facility]  . LOBECTOMY Right 01/05/2016   Procedure: RIGHT LOWER LOBE LOBECTOMY;  Surgeon: Melrose Nakayama, MD;  Location: [place];  Service: Thoracic;  Laterality: Right;  . MASTECTOMY PARTIAL / LUMPECTOMY W/ AXILLARY LYMPHADENECTOMY  04/05/1987   Right - Dr Annamaria Boots  . RESECTION OF MEDIASTINAL MASS N/A 01/05/2016   Procedure: RESECTION OF PLEURAL MASS, right;  Surgeon: Melrose Nakayama, MD;  Location: [place];  Service: Thoracic;  Laterality: N/A;  . TONSILLECTOMY    . TOTAL HIP ARTHROPLASTY    . TUBAL LIGATION Bilateral 1970  . VAGINAL HYSTERECTOMY  1977   myomata  . VIDEO ASSISTED THORACOSCOPY (VATS)/WEDGE RESECTION Right 01/05/2016   Procedure: RIGHT VIDEO ASSISTED THORACOSCOPY (VATS)/WEDGE RESECTION;  Surgeon:  Melrose Nakayama, MD;  Location: [place];  Service: Thoracic;  Laterality: Right;    Current Outpatient Prescriptions  Medication Sig Dispense Refill  . acetaminophen (TYLENOL) 500 MG tablet Take 2 tablets (1,000 mg total) by mouth every 6 (six) hours as needed for mild pain. 30 tablet 0  . amLODipine (NORVASC) 10 MG tablet Take 10 mg by mouth daily.    Marland Kitchen apixaban (ELIQUIS) 5 MG TABS tablet Take 5 mg by mouth 2 (two) times daily.    . Calcium Carbonate-Vitamin D (CALTRATE 600+D) 600-400 MG-UNIT per tablet Take 1 tablet by mouth daily.    . carboxymethylcellulose  (REFRESH PLUS) 0.5 % SOLN Place 1 drop into both eyes 2 (two) times daily as needed (for dry eyes).    . carvedilol (COREG) 6.25 MG tablet Take 1 tablet (6.25 mg total) by mouth 2 (two) times daily with a meal. 60 tablet 1  . Ergocalciferol (VITAMIN D2) 400 units TABS Take 400 Units by mouth daily.    . Estradiol (VAGIFEM) 10 MCG TABS vaginal tablet Place 1 tablet (10 mcg total) vaginally 2 (two) times a week. 24 tablet 3  . Multiple Vitamin (MULTIVITAMIN WITH MINERALS) TABS Take 1 tablet by mouth every evening.    . Omega-3 Fatty Acids (FISH OIL) 1000 MG CAPS Take 1 capsule by mouth 2 (two) times daily.    Marland Kitchen telmisartan (MICARDIS) 80 MG tablet Take 80 mg by mouth daily.     No current facility-administered medications for this visit.     Family History  Problem Relation Age of Onset  . Colon cancer Mother   . Colon cancer Father   . Heart disease Father     ROS:  Pertinent items are noted in HPI.  Otherwise, a comprehensive ROS was negative.  Exam:   BP 118/72 (BP Location: Right Arm, Patient Position: Sitting, Cuff Size: Normal)   Pulse 64   Resp 16   Ht 5' 0.25" (1.53 m)   Wt 125 lb (56.7 kg)   LMP 10/16/1975 (Approximate)   BMI 24.21 kg/m  Height: 5' 0.25" (153 cm) Ht Readings from Last 3 Encounters:  07/10/17 5' 0.25" (1.53 m)  02/05/17 5\' 1"  (1.549 m)  01/26/17 5\' 1"  (1.549 m)    General appearance: alert, cooperative and appears stated age Head: Normocephalic, without obvious abnormality, atraumatic Neck: no adenopathy, supple, symmetrical, trachea midline and thyroid normal to inspection and palpation Lungs: clear to auscultation bilaterally Breasts: normal appearance, no masses or tenderness, No nipple retraction or dimpling, No nipple discharge or bleeding, No axillary or supraclavicular adenopathy, left breast only, mastectomy scarring on right, no masses in scar area Heart: regular rate and rhythm Abdomen: soft, non-tender; no masses,  no  organomegaly Extremities: extremities normal, atraumatic, no cyanosis or edema Skin: Skin color, texture, turgor normal. No rashes or lesions Lymph nodes: Cervical, supraclavicular, and axillary nodes normal. No abnormal inguinal nodes palpated Neurologic: Grossly normal   Pelvic: External genitalia:  no lesions, but dryness and scaling noted, no exudate              Urethra:  normal appearing urethra with no masses, tenderness or lesions              Bartholin's and Skene's: normal                 Vagina: atrophic appearing vagina with normal color and discharge, no lesions, wet prep taken  Cervix: absent              Pap taken: No. Bimanual Exam:  Uterus:  uterus absent              Adnexa: normal adnexa and no mass, fullness, tenderness               Rectovaginal: Confirms               Anus:  normal sphincter tone, no lesions   Wet prep: KOH, Saline negative for pathogens  Chaperone present: yes  A:  Well Woman with normal exam  Menopausal no HRT s/p TVH for fibroid, ovaries retained  Vaginal dryness,( wet prep negative) using Vagifem once weekly and coconut oil prn   Desires continuance of Vagifem which helps with dryness and urinary incontinence  History of Breast cancer 1988 right breast and right lung cancer in 2017 with new nodules noted under evaluation  Hypertension, with PCP management    P:   Reviewed health and wellness pertinent to exam  Aware of need to evaluate if vaginal bleeding with history of dryness and Vagifem use. Risks and benefits discussed and patient prefers use, helps with decrease in UTI's  Rx Vagifem see order with instructions  Continue follow up with MD's as indicated with her medical issues.   Pap smear: no   counseled on breast self exam, mammography screening, feminine hygiene, adequate intake of calcium and vitamin D, diet and exercise, Kegel's exercises  return annually or prn  An After Visit Summary was printed and given to  the patient.

## 2017-07-10 NOTE — Patient Instructions (Signed)

## 2017-08-13 ENCOUNTER — Encounter: Payer: Self-pay | Admitting: Thoracic Surgery (Cardiothoracic Vascular Surgery)

## 2017-08-13 ENCOUNTER — Ambulatory Visit (INDEPENDENT_AMBULATORY_CARE_PROVIDER_SITE_OTHER): Payer: Medicare Other | Admitting: Thoracic Surgery (Cardiothoracic Vascular Surgery)

## 2017-08-13 ENCOUNTER — Ambulatory Visit
Admission: RE | Admit: 2017-08-13 | Discharge: 2017-08-13 | Disposition: A | Discharge status: Home / Self Care | Payer: Medicare Other | Source: Ambulatory Visit | Attending: Thoracic Surgery (Cardiothoracic Vascular Surgery) | Admitting: Thoracic Surgery (Cardiothoracic Vascular Surgery)

## 2017-08-13 VITALS — BP 144/78 | HR 67 | Ht 60.25 in | Wt 127.0 lb

## 2017-08-13 DIAGNOSIS — R911 Solitary pulmonary nodule: Secondary | ICD-10-CM

## 2017-08-13 DIAGNOSIS — R918 Other nonspecific abnormal finding of lung field: Secondary | ICD-10-CM

## 2017-08-13 DIAGNOSIS — D3A090 Benign carcinoid tumor of the bronchus and lung: Secondary | ICD-10-CM | POA: Diagnosis not present

## 2017-08-13 NOTE — Progress Notes (Signed)
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  She had a prolonged air leak postoperatively.  Her past medical history is also significant for atrial fibrillation, arthritis, breast cancer, GERD, hypertension, right arm lymphedema, and osteoporosis.  I last saw her in the office in April.  She was doing well at that time.  Her CT showed a new groundglass opacity in the right upper lobe there was 6 mm in diameter.  She now returns for a six-month follow-up scan.  She has been feeling well.  She has not had any significant respiratory symptoms recently.  Her appetite is good and her weight is stable.  Past Medical History:  Diagnosis Date  . Arthritis 1996  . Atrial fibrillation (HCC)    on coumadin  . Atrial tachycardia ([place])   . Cancer [facility]) 1988   breast cancer, R mastectomy, chemo  . 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 Diverticulitis 1999  . Femur fracture ([place]) 03/2013   left, non weight bearing for 2 1/2 weeks  . GERD (gastroesophageal reflux disease)   . Hemorrhoids 1991  . HTN (hypertension)   . Lymphedema of upper extremity    right arm  . Osteoporosis   . PONV (postoperative nausea and vomiting)   . Urinary, incontinence, stress female   . Vitamin D deficiency disease     Current Outpatient Prescriptions  Medication Sig Dispense Refill  . acetaminophen (TYLENOL) 500 MG tablet Take 2 tablets (1,000 mg total) by mouth every 6 (six) hours as needed for mild pain. 30 tablet 0  . amLODipine (NORVASC) 10 MG tablet Take 10 mg by mouth daily.    Marland Kitchen apixaban (ELIQUIS) 5 MG TABS tablet Take 5 mg by mouth 2 (two) times daily.    . Calcium Carbonate-Vitamin D (CALTRATE 600+D) 600-400 MG-UNIT per tablet  Take 1 tablet by mouth daily.    . carboxymethylcellulose (REFRESH PLUS) 0.5 % SOLN Place 1 drop into both eyes 2 (two) times daily as needed (for dry eyes).    . carvedilol (COREG) 6.25 MG tablet Take 1 tablet (6.25 mg total) by mouth 2 (two) times daily with a meal. 60 tablet 1  . Ergocalciferol (VITAMIN D2) 400 units TABS Take 400 Units by mouth daily.    . Estradiol (VAGIFEM) 10 MCG TABS vaginal tablet Place 1 tablet (10 mcg total) vaginally 2 (two) times a week. 24 tablet 3  . Multiple Vitamin (MULTIVITAMIN WITH MINERALS) TABS Take 1 tablet by mouth every evening.    . Omega-3 Fatty Acids (FISH OIL) 1000 MG CAPS Take 1 capsule by mouth 2 (two) times daily.    Marland Kitchen telmisartan (MICARDIS) 80 MG tablet Take 80 mg by mouth daily.     No current facility-administered medications for this visit.     Physical Exam BP (!) 144/78   Pulse 67   Ht 5' 0.25" (1.53 m)   Wt 127 lb (57.6 kg)   LMP 10/16/1975 (Approximate)   SpO2 98%   BMI 24.46 kg/m  83 year old woman in no acute distress Alert and oriented x3 with no focal deficits No cervical or subclavicular adenopathy Cardiac  irregularly irregular Lungs diminished at right base, otherwise clear  Diagnostic Tests: CT CHEST WITHOUT CONTRAST  TECHNIQUE: Multidetector CT imaging of the chest was performed following the standard protocol without IV contrast.  COMPARISON:  02/05/2017  FINDINGS: Cardiovascular: No acute findings. Aortic atherosclerosis. Coronary artery calcification. Stable mild cardiomegaly.  Mediastinum/Nodes: No masses or pathologically enlarged lymph nodes identified on this unenhanced exam. Prior right mastectomy and axillary lymph node dissection. No axillary lymphadenopathy identified.  Lungs/Pleura: Stable postop changes from right lower lobectomy. Small right pleural effusion slightly decreased in size. No evidence of pulmonary infiltrate. A 10 mm irregular pulmonary nodule seen in the posterior right  lower lobe on image 79/4 which was not seen on previous study other sub-cm bilateral pulmonary nodules show no significant change.  Upper Abdomen:  Stable small hepatic cysts.  Otherwise unremarkable.  Musculoskeletal: No suspicious bone lesions. Old mild T12 vertebral body compression fracture deformity again noted.  IMPRESSION: New 10 mm irregular pulmonary nodule in posterior right lower lobe. Differential diagnosis includes carcinoma and infectious or inflammatory etiologies. Consider one of the following in 3 months for both low-risk and high-risk individuals: (a) repeat chest CT, (b) follow-up PET-CT, or (c) tissue sampling. This recommendation follows the consensus statement: Guidelines for Management of Incidental Pulmonary Nodules Detected on CT Images: From the Fleischner Society 2017; Radiology 2017; 284:228-243.  Other sub-cm bilateral pulmonary nodules remain stable. Recommend continued attention on follow-up imaging.  Slight decrease in size of small right pleural effusion.  Aortic Atherosclerosis (ICD10-I70.0). Coronary artery calcification.   Electronically Signed   By: Earle Gell M.D.   On: 08/13/2017 10:59 I personally reviewed the CT images and concur with the findings noted above  Impression: 83 year old woman with a history of breast cancer and also a carcinoid tumor of the right lower lobe.  On her scan today there is a new 27mm right lung nodule that was not present on her previous scan 6 months ago.  The 6 mm groundglass opacity that was new on her last CT has resolved.  She has multiple other nodules that are stable over multiple scans.  10 mm right lung nodule-differential diagnosis includes metastatic breast cancer, new primary bronchogenic carcinoma, carcinoid tumor, as well as infectious and inflammatory causes.  Given the waxing and waning nature of other nodules in the past I think it is reasonable to continue with radiographic follow-up but  with a shorter interval.  I recommended that we do a PET/CT in 3 months to better characterize the nodule and guide any additional diagnostic workup that is necessary.    Melrose Nakayama, MD [facility] [phone]

## 2017-09-24 IMAGING — CT CT CHEST W/O CM
3 of 4 series · 16 of 30 positions shown, 18 images · non-contrast
Comparison: Multiple prior chest radiographs. Chest CT dated
04/26/2014.

CLINICAL DATA: f/u surgery [DATE]/sob x 1 year Right lower
lobectomy Right lung ca / no chemo / rad tx , right breast cancer
with surgery and chemo

EXAM:
CT CHEST WITHOUT CONTRAST
TECHNIQUE: Multidetector CT imaging of the chest was performed following the
standard protocol without IV contrast.

[Series 3: chest w/o · axial · non-contrast · 0.59mm/px · z∈[-232,-22]mm · 7 of 112 slices shown, 9 images]
[im 14/112  mediastinal]
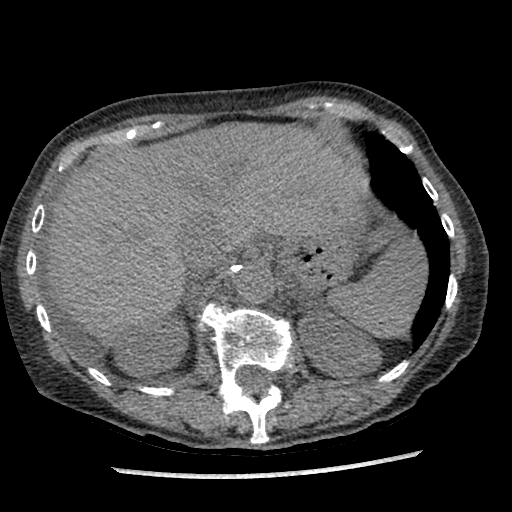
[im 14/112  lung]
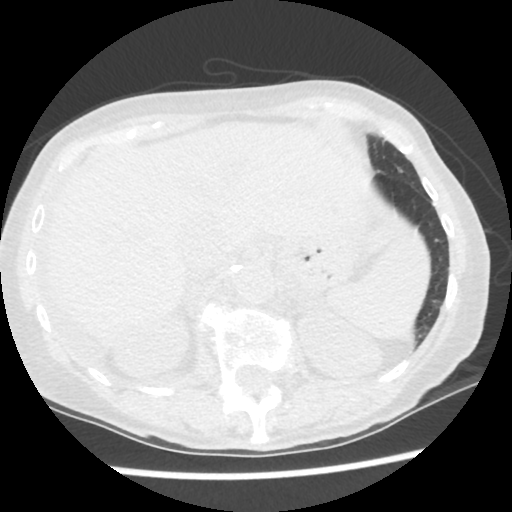
[im 28/112  lung]
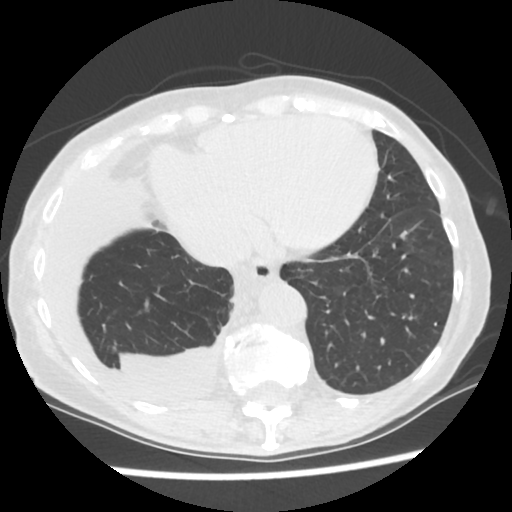
[im 42/112  lung]
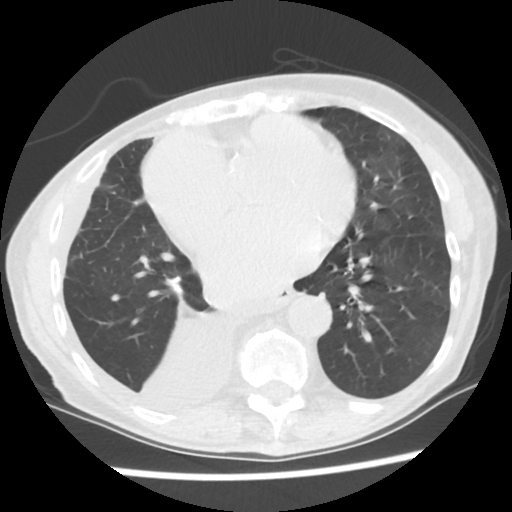
[im 56/112  lung]
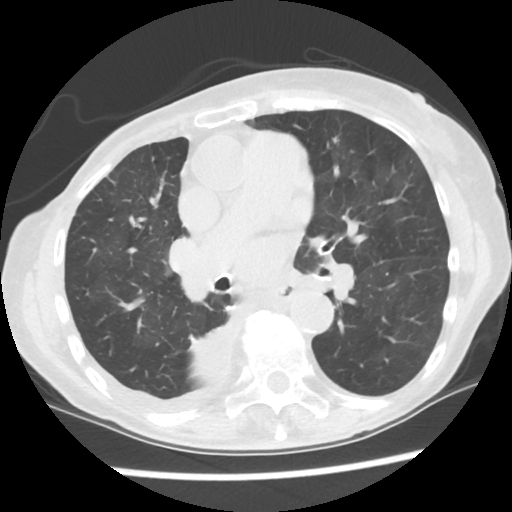
[im 70/112  mediastinal]
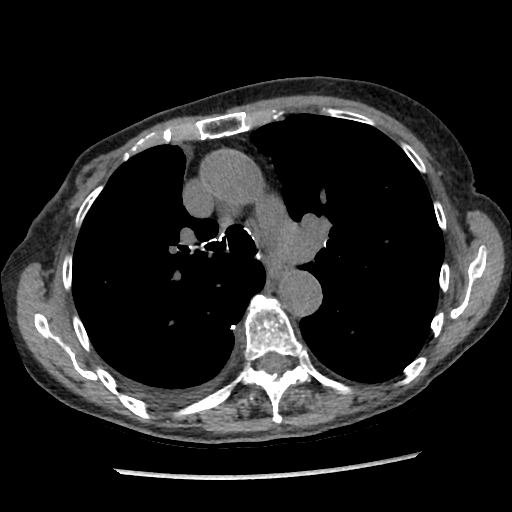
[im 70/112  lung]
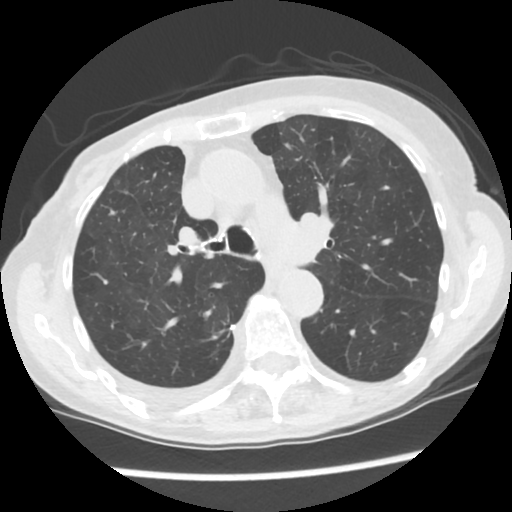
[im 84/112  lung]
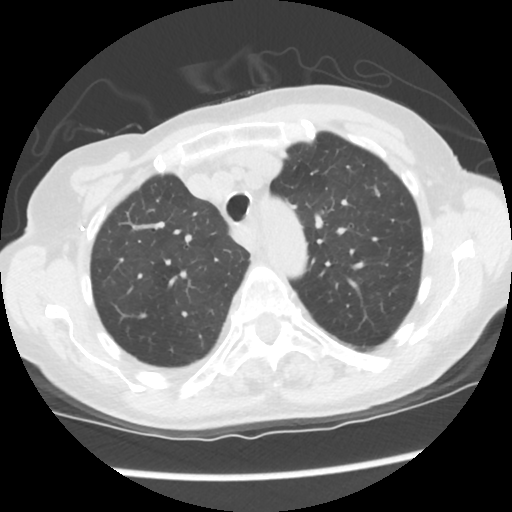
[im 98/112  lung]
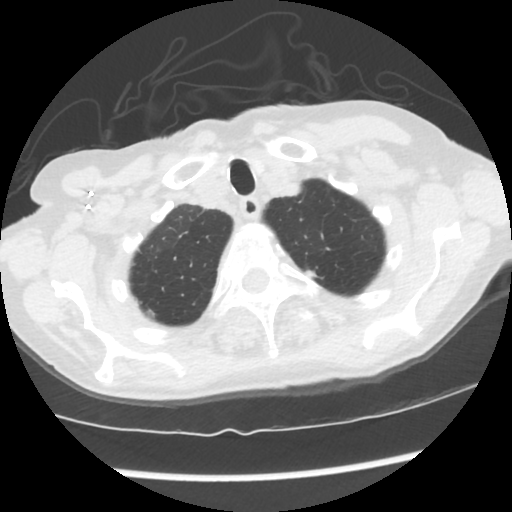

[Series 4: lung windows · axial · 0.59mm/px · z∈[-232,-22]mm · 7 of 112 slices shown]
[im 14/112  lung]
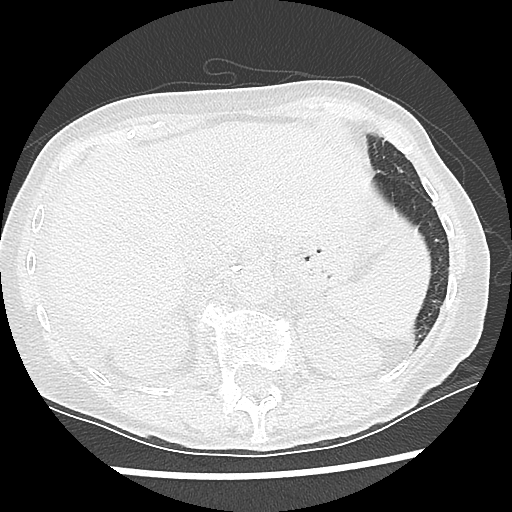
[im 28/112  lung]
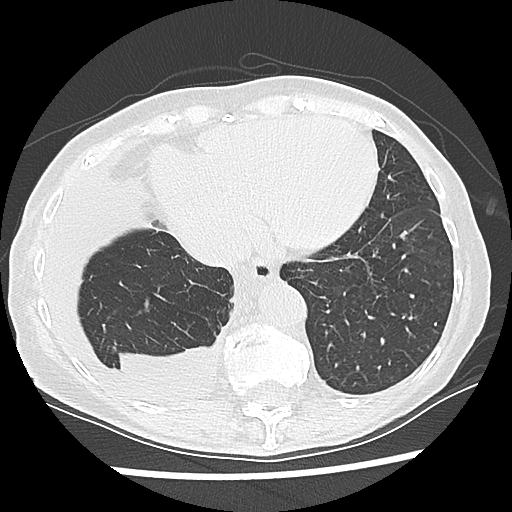
[im 42/112  lung]
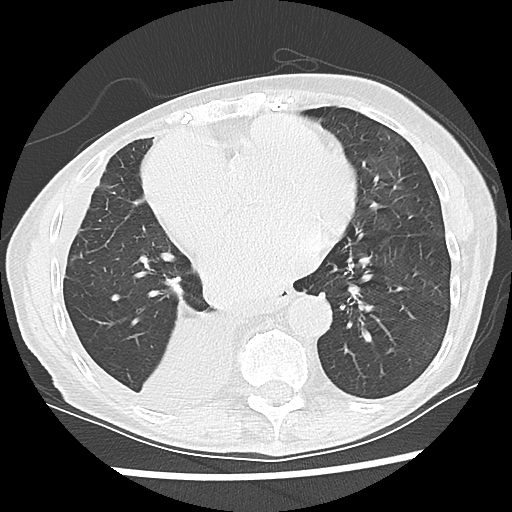
[im 56/112  lung]
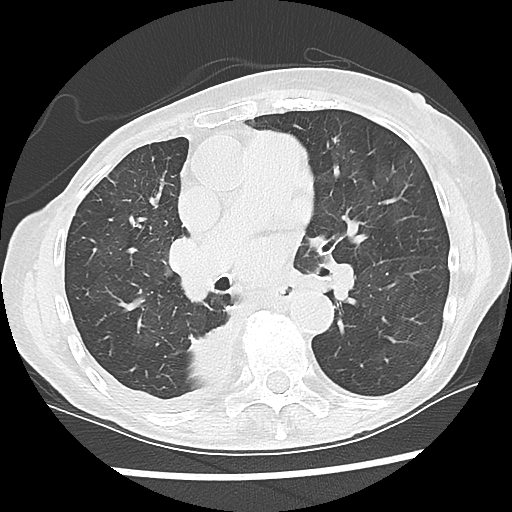
[im 70/112  lung]
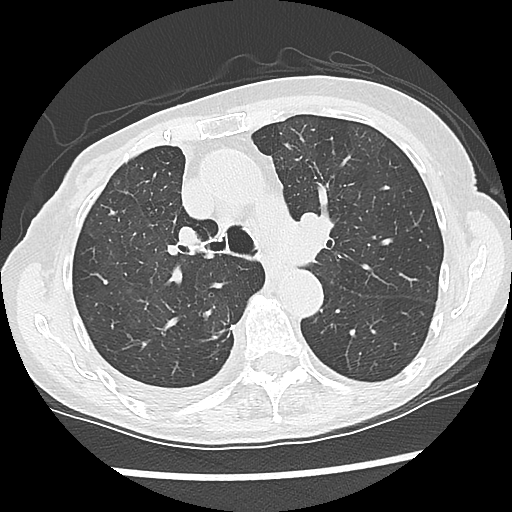
[im 84/112  lung]
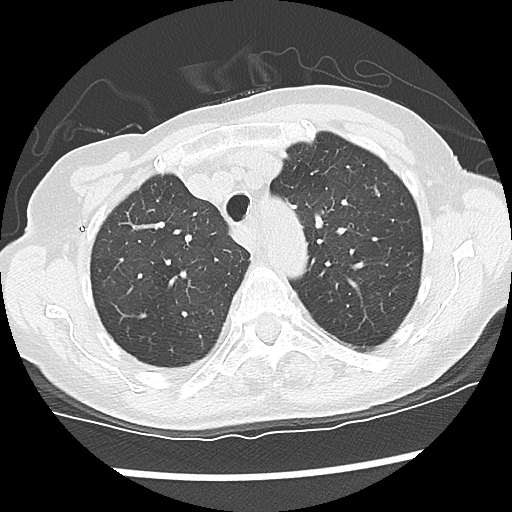
[im 98/112  lung]
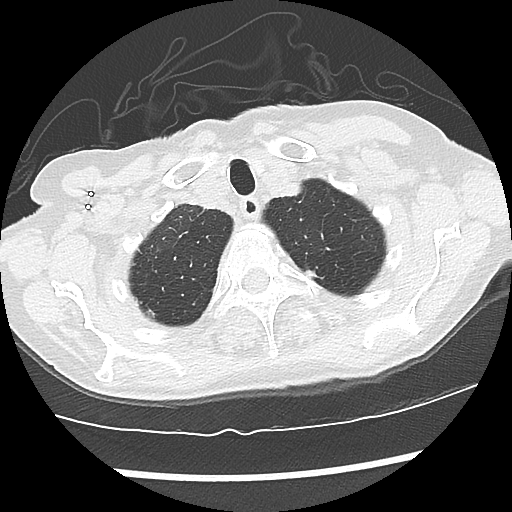

[Series 602: sagittal body · sagittal · 0.59mm/px · 2 of 123 slices shown]
[im 14/123  mediastinal]
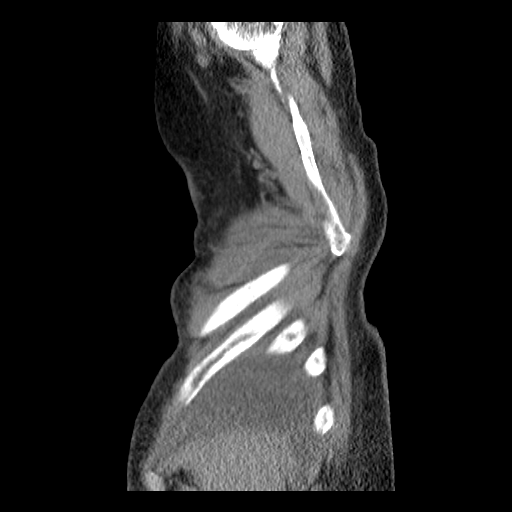
[im 28/123  mediastinal]
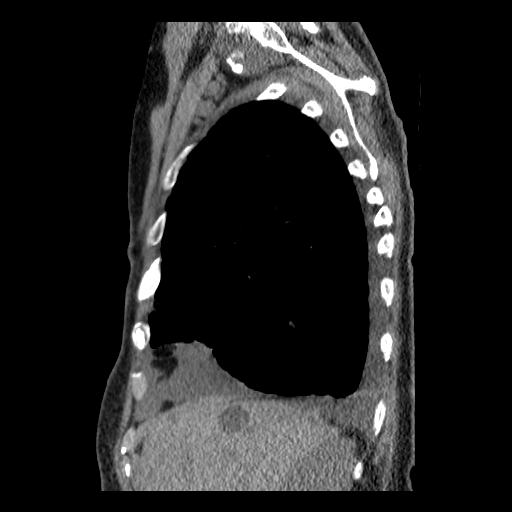

[16 of 30 positions shown; findings below may reference images not displayed]

FINDINGS: Cardiovascular: Heart is top-normal in size. There are dense
coronary artery calcifications. Mild enlargement of the main
pulmonary artery measuring 3.1 cm. Aorta is normal caliber. Mild
atherosclerotic calcifications noted along the thoracic aorta.

Mediastinum/Nodes: No mediastinal or hilar masses. Several mildly
prominent lymph nodes. The largest is a 13 mm subcarinal node. No
hilar adenopathy. No neck base or axillary masses or adenopathy. 7
mm low-density left thyroid nodule with an adjacent calcification.

Lungs/Pleura: There is loculated pleural fluid along the
posteromedial aspect of the lower right hemithorax extending from
below the level of the right hilum. There is additional right
pleural fluid is noted along the margin of the lower right lung.
Mild linear atelectasis or scarring is noted at the lung bases.
There is focal opacity associated with a pulmonary anastomosis
staple line extending from the inferior right hilum.

There are small pulmonary nodules. In the right upper lobe, there is
a 5 x 3 mm, average 4 mm, nodule centered on image 22, series 4.
There is a focal ground-glass nodule measuring 6 mm in the anterior
right upper lobe, image 42. There is a 5 mm nodule in the left upper
lobe, image 33. 3-4 mm nodule in the left lower lobe, image 72. 3 mm
nodule in the left lower lobe centrally, image 67. 3-4 mm nodule,
left lower lobe, image 77.

No left pleural effusion. No pneumothorax. No evidence of pneumonia
or pulmonary edema.

Upper Abdomen: Several low-density liver lesions, largest in the
right lobe measuring 15 mm, all likely cysts. Ill-defined thickening
of the adrenal glands without a discrete mass. No acute findings.

Musculoskeletal: Mild compression fracture of L1 stable from a
lateral chest radiograph dated 07/17/2016. Developmentally small
disc at the T3-T4 and T4-T5 levels. No acute fracture. No
osteoblastic or osteolytic lesions.
IMPRESSION: 1. No acute findings.  No evidence of pneumonia or pulmonary edema.
2. Postsurgical changes on the right from what appears to be a
partial right lower lobectomy. Loculated fluid extends from the
inferior right hilar region along the posteromedial aspect of the
right lower hemithorax, with a small amount of non loculated right
pleural fluid.
3. There are several small pulmonary nodules as detailed above. The
ground-glass nodule in the anterior right upper lobe, image 42, is
new since the 7652 CT scan. All other nodules are stable. For the
ground-glass nodule, recommend follow-up CT in 6 months.

## 2017-10-11 ENCOUNTER — Other Ambulatory Visit: Payer: Self-pay | Admitting: Thoracic Surgery (Cardiothoracic Vascular Surgery)

## 2017-10-11 DIAGNOSIS — R918 Other nonspecific abnormal finding of lung field: Secondary | ICD-10-CM

## 2017-11-05 ENCOUNTER — Ambulatory Visit (INDEPENDENT_AMBULATORY_CARE_PROVIDER_SITE_OTHER): Payer: Medicare Other | Admitting: Thoracic Surgery (Cardiothoracic Vascular Surgery)

## 2017-11-05 ENCOUNTER — Ambulatory Visit
Admission: RE | Admit: 2017-11-05 | Discharge: 2017-11-05 | Disposition: A | Discharge status: Home / Self Care | Payer: Medicare Other | Source: Ambulatory Visit | Attending: Thoracic Surgery (Cardiothoracic Vascular Surgery) | Admitting: Thoracic Surgery (Cardiothoracic Vascular Surgery)

## 2017-11-05 VITALS — BP 129/66 | HR 76 | Resp 18 | Ht 60.25 in | Wt 126.0 lb

## 2017-11-05 DIAGNOSIS — I700 Atherosclerosis of aorta: Secondary | ICD-10-CM

## 2017-11-05 DIAGNOSIS — D3A090 Benign carcinoid tumor of the bronchus and lung: Secondary | ICD-10-CM

## 2017-11-05 DIAGNOSIS — R918 Other nonspecific abnormal finding of lung field: Secondary | ICD-10-CM | POA: Diagnosis not present

## 2017-11-05 NOTE — Progress Notes (Signed)
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  She also has a history of atrial fibrillation, arthritis, breast cancer, gastroesophageal reflux, hypertension, lymphedema, and osteoporosis.  I last saw her in October 2018.  A CT at that time showed a 10 mm nodular opacity in the inferior aspect of the right lung.  She now returns for 24-month interval follow-up on that.  She has been feeling well.  She moved into [facility] and she is pleased with that move.  She says she is getting a lot of exercise.  She is not had any respiratory issues.  Her appetite is good she has not experienced any significant weight loss.  Past Medical History:  Diagnosis Date  . Arthritis 1996  . Atrial fibrillation (HCC)    on coumadin  . Atrial tachycardia ([place])   . Cancer [facility]) 1988   breast cancer, R mastectomy, chemo  . 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 Diverticulitis 1999  . Femur fracture ([place]) 03/2013   left, non weight bearing for 2 1/2 weeks  . GERD (gastroesophageal reflux disease)   . Hemorrhoids 1991  . HTN (hypertension)   . Lymphedema of upper extremity    right arm  . Osteoporosis   . PONV (postoperative nausea and vomiting)   . Urinary, incontinence, stress female   . Vitamin D deficiency disease     Current Outpatient Medications  Medication Sig Dispense Refill  . acetaminophen (TYLENOL) 500 MG tablet Take 2 tablets (1,000 mg total) by mouth every 6 (six) hours as needed for mild pain. 30 tablet 0  . amLODipine (NORVASC) 10 MG tablet Take 10 mg by mouth daily.    Marland Kitchen apixaban (ELIQUIS) 5 MG TABS tablet Take 5 mg by mouth 2 (two) times daily.    . Calcium Carbonate-Vitamin D (CALTRATE 600+D)  600-400 MG-UNIT per tablet Take 1 tablet by mouth daily.    . carboxymethylcellulose (REFRESH PLUS) 0.5 % SOLN Place 1 drop into both eyes 2 (two) times daily as needed (for dry eyes).    . carvedilol (COREG) 6.25 MG tablet Take 1 tablet (6.25 mg total) by mouth 2 (two) times daily with a meal. 60 tablet 1  . Ergocalciferol (VITAMIN D2) 400 units TABS Take 400 Units by mouth daily.    . Estradiol (VAGIFEM) 10 MCG TABS vaginal tablet Place 1 tablet (10 mcg total) vaginally 2 (two) times a week. 24 tablet 3  . Multiple Vitamin (MULTIVITAMIN WITH MINERALS) TABS Take 1 tablet by mouth every evening.    . Omega-3 Fatty Acids (FISH OIL) 1000 MG CAPS Take 1 capsule by mouth 2 (two) times daily.    Marland Kitchen telmisartan (MICARDIS) 80 MG tablet Take 80 mg by mouth daily.     No current facility-administered medications for this visit.     Physical Exam BP 129/66   Pulse 76   Resp 18   Ht 5' 0.25" (1.53 m)   Wt 126 lb (57.2 kg)   LMP 10/16/1975 (Approximate)   SpO2 98% Comment: RA  BMI 24.40 kg/m  Elderly woman in no acute distress Alert and oriented x3  with no focal deficits No cervical or supraclavicular adenopathy Cardiac regular rate and rhythm normal S1-S2 Lungs diminished at right base, otherwise clear  Diagnostic Tests: CT CHEST WITHOUT CONTRAST  TECHNIQUE: Multidetector CT imaging of the chest was performed following the standard protocol without IV contrast.  COMPARISON:  08/13/2017  FINDINGS: Cardiovascular: Mild cardiomegaly with left atrial enlargement. No pericardial effusion.  No evidence of thoracic aortic aneurysm. Atherosclerotic calcifications of the aortic arch.  Three vessel coronary atherosclerosis.  Mediastinum/Nodes: No suspicious mediastinal lymphadenopathy.  Visualized thyroid is notable for a 7 mm left thyroid nodule.  Lungs/Pleura: Status post right lower lobectomy.  Small right pleural effusion.  Linear/platelike nodular opacity in the  posterior right lower lung/right middle lobe (series 4/images 91-92), corresponding to the prior 10 mm nodule. On coronal imaging, a residual 7 mm nodule persists (series 601/image 87).  5 x 3 mm right upper lobe pulmonary nodule (series 4/image 29), unchanged. Two 3-4 mm left lower lobe pulmonary nodules (series 4/images 76 and 81), unchanged.  Mild biapical pleural-parenchymal scarring.  No focal consolidation.  No pneumothorax.  Upper Abdomen: Visualized upper abdomen is notable for hepatic cysts measuring up to 1.8 cm and vascular calcifications.  Musculoskeletal: Mild superior endplate compression fracture deformity at T12, unchanged.  IMPRESSION: Residual 7 mm nodule in the right lower lobe, decreased, possibly reflecting post infectious/inflammatory scarring. Continued attention on follow-up is suggested.  Additional scattered small bilateral pulmonary nodules measuring to 4 mm (mean diameter), unchanged.  Status post right lower lobectomy.  Small right pleural effusion.  Aortic Atherosclerosis (ICD10-I70.0).   Electronically Signed   By: Julian Hy M.D.   On: 11/05/2017 13:05 I personally reviewed the CT images and concur with the findings noted above  Impression: [name] is an 84 year old woman who is now almost 2 years out from a right lower lobectomy for stage IA carcinoid tumor.  She has no evidence of recurrent disease.  At her last visit there was a question of a new 10 mm lung nodule at the right base.  On today's scan that area is much less nodular.  There is some residual opacity there but it is more spread out.  It appears to be consistent with an infectious or inflammatory etiology.  She does have multiple other small lung nodules that have been stable.  Aortic atherosclerosis-stable and asymptomatic at this time.  Plan: Return in 6 months with repeat CT chest to follow-up multiple lung nodules  Melrose Nakayama,  MD [facility] [phone]

## 2018-03-26 ENCOUNTER — Other Ambulatory Visit: Payer: Self-pay | Admitting: *Deleted

## 2018-03-26 DIAGNOSIS — R918 Other nonspecific abnormal finding of lung field: Secondary | ICD-10-CM

## 2018-03-26 DIAGNOSIS — R911 Solitary pulmonary nodule: Secondary | ICD-10-CM

## 2018-05-06 ENCOUNTER — Other Ambulatory Visit: Payer: Self-pay

## 2018-05-06 ENCOUNTER — Encounter: Payer: Self-pay | Admitting: Thoracic Surgery (Cardiothoracic Vascular Surgery)

## 2018-05-06 ENCOUNTER — Ambulatory Visit
Admission: RE | Admit: 2018-05-06 | Discharge: 2018-05-06 | Disposition: A | Discharge status: Home / Self Care | Payer: Medicare Other | Source: Ambulatory Visit | Attending: Thoracic Surgery (Cardiothoracic Vascular Surgery) | Admitting: Thoracic Surgery (Cardiothoracic Vascular Surgery)

## 2018-05-06 ENCOUNTER — Ambulatory Visit (INDEPENDENT_AMBULATORY_CARE_PROVIDER_SITE_OTHER): Payer: Medicare Other | Admitting: Thoracic Surgery (Cardiothoracic Vascular Surgery)

## 2018-05-06 VITALS — BP 120/80 | HR 61 | Resp 16 | Ht 60.25 in | Wt 129.0 lb

## 2018-05-06 DIAGNOSIS — R918 Other nonspecific abnormal finding of lung field: Secondary | ICD-10-CM

## 2018-05-06 DIAGNOSIS — Z902 Acquired absence of lung [part of]: Secondary | ICD-10-CM | POA: Diagnosis not present

## 2018-05-06 DIAGNOSIS — C7A090 Malignant carcinoid tumor of the bronchus and lung: Secondary | ICD-10-CM

## 2018-05-06 NOTE — Progress Notes (Signed)
[address]       [place] [address]             [phone]    HPI: Mrs. 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  She had a prolonged air leak postoperatively, but her course was otherwise uncomplicated.  She also has a past medical history of atrial fibrillation, arthritis, breast cancer, right arm lymphedema, reflux, hypertension, and osteoporosis.  I last saw her in the office January 2019.  That was a 76-month follow-up for a 10 mm nodule that had been seen in October.  That area had decreased in size by January.  No further work-up was necessary.  In the interim since her last visit she is been doing well.  She has not had any new health problems.  She denies any problems with her breathing.  Past Medical History:  Diagnosis Date  . Arthritis 1996  . Atrial fibrillation (HCC)    on coumadin  . Atrial tachycardia ([place])   . Cancer [facility]) 1988   breast cancer, R mastectomy, chemo  . 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 Diverticulitis 1999  . Femur fracture ([place]) 03/2013   left, non weight bearing for 2 1/2 weeks  . GERD (gastroesophageal reflux disease)   . Hemorrhoids 1991  . HTN (hypertension)   . Lymphedema of upper extremity    right arm  . Osteoporosis   . PONV (postoperative nausea and vomiting)   . Urinary, incontinence, stress female   . Vitamin D deficiency disease     Current Outpatient Medications  Medication Sig Dispense Refill  . acetaminophen (TYLENOL) 500 MG tablet Take 2 tablets (1,000 mg total) by mouth every 6 (six) hours as needed for mild pain. 30 tablet 0  . amLODipine (NORVASC) 10 MG tablet Take 10 mg by mouth daily.    Marland Kitchen apixaban (ELIQUIS) 5 MG TABS tablet Take 5 mg by mouth 2 (two) times daily.    . Calcium Carbonate-Vitamin D  (CALTRATE 600+D) 600-400 MG-UNIT per tablet Take 1 tablet by mouth daily.    . carboxymethylcellulose (REFRESH PLUS) 0.5 % SOLN Place 1 drop into both eyes 2 (two) times daily as needed (for dry eyes).    . carvedilol (COREG) 6.25 MG tablet Take 1 tablet (6.25 mg total) by mouth 2 (two) times daily with a meal. 60 tablet 1  . Ergocalciferol (VITAMIN D2) 400 units TABS Take 400 Units by mouth daily.    . Estradiol (VAGIFEM) 10 MCG TABS vaginal tablet Place 1 tablet (10 mcg total) vaginally 2 (two) times a week. 24 tablet 3  . Multiple Vitamin (MULTIVITAMIN WITH MINERALS) TABS Take 1 tablet by mouth every evening.    . Omega-3 Fatty Acids (FISH OIL) 1000 MG CAPS Take 1 capsule by mouth 2 (two) times daily.    Marland Kitchen telmisartan (MICARDIS) 80 MG tablet Take 80 mg by mouth daily.     No current facility-administered medications for this visit.     Physical Exam BP 120/80 (BP Location: Left Arm, Patient Position: Sitting, Cuff Size: Normal)   Pulse 61   Resp 16   Ht 5' 0.25" (1.53 m)   Wt 129 lb (58.5 kg)   LMP 10/16/1975 (Approximate)   SpO2 98% Comment: ON RA  BMI 24.98 kg/m  Elderly woman in no acute distress Well-developed and well-nourished Alert and oriented x3 with no focal neurologic deficits Lungs diminished right base, otherwise clear Cardiac regular rate and rhythm Arthritic changes in hands  Diagnostic Tests: CT CHEST WITHOUT CONTRAST  TECHNIQUE: Multidetector CT imaging of the chest was performed following the standard protocol without IV contrast.  COMPARISON:  CT 11/05/2017, 08/13/2017  FINDINGS: Cardiovascular: Coronary artery calcification and aortic atherosclerotic calcification.  Mediastinum/Nodes: No axillary supraclavicular adenopathy. Small thyroid nodules stable.  Borderline enlarged mediastinal lymph nodes are unchanged. No pericardial effusion  Lungs/Pleura: Post RIGHT lower lobectomy. Angular fluid collection in the medial and inferior aspect of  the RIGHT hemithorax is unchanged. Branching nodular lesion in the RIGHT lower lobe is increased in prominence. (Image 98 through 102 of series 8). Individual nodule associated with this branching pattern measures 7 mm (image 102 compared to 4 mm).  Within the RIGHT upper lobe new elongated nodular lesion measures 14 mm by 11 mm (image 71/8). Second pulmonary nodule in the RIGHT upper lobe measures 5 mm on image 31/8 is unchanged  In LEFT lower lobe, new rounded nodule measures 11 mm by 11 mm (image 102/8). This nodule touches the pleural surface posteriorly.  Upper Abdomen: cystic lesion the RIGHT hepatic lobe is unchanged  Musculoskeletal: No aggressive osseous lesion. Degenerate spurring of spine.  IMPRESSION: 1. Increased branching nodularity in the RIGHT lower lobe. 2. New lobular nodule in the RIGHT upper lobe. 3. New smoothly marginated nodule in the LEFT lower lobe. 4. Overall findings are concerning for carcinoid tumor metastasis. Consider Ga 31 DOTATATE PET/CT scan (specific for low-grade/well differentiated neuroendocrine tumor) versus tissue sampling.   Electronically Signed   By: Suzy Bouchard M.D.   On: 05/06/2018 12:09 I personally reviewed the CT images and concur with the findings noted above  Impression: Mrs. Everman is an 84 year old woman with a past history of atrial fibrillation, arthritis, breast cancer, right arm lymphedema, hypertension, hyperlipidemia, and a right lower lobectomy for a stage IA carcinoid tumor in 2017.  She is a lifelong non-smoker.  She was found to have a new nodule in the right lung back in October 2018.  A follow-up CT in January showed that area had improved.  She now was over 2 years out from surgery.  Her CT today shows a new left lower lobe lung nodule that is concerning.  There are some findings in the right lung that need continued attention but are less concerning to me than the left lung nodule.  Given her history of  carcinoid tumor I think the appropriate next step to further evaluate the left lower lobe lung nodule is doing dotatate PET scan.  We will arrange that.   Plan: Dotatate PET/CT  Return in 2 weeks to discuss results.  Melrose Nakayama, MD [facility] [phone]

## 2018-05-07 ENCOUNTER — Other Ambulatory Visit: Payer: Self-pay | Admitting: Thoracic Surgery (Cardiothoracic Vascular Surgery)

## 2018-05-07 DIAGNOSIS — R918 Other nonspecific abnormal finding of lung field: Secondary | ICD-10-CM

## 2018-05-22 ENCOUNTER — Encounter (HOSPITAL_COMMUNITY)
Admission: RE | Admit: 2018-05-22 | Discharge: 2018-05-22 | Disposition: A | Discharge status: Home / Self Care | Payer: Medicare Other | Source: Ambulatory Visit | Attending: Thoracic Surgery (Cardiothoracic Vascular Surgery) | Admitting: Thoracic Surgery (Cardiothoracic Vascular Surgery)

## 2018-05-22 DIAGNOSIS — R918 Other nonspecific abnormal finding of lung field: Secondary | ICD-10-CM | POA: Diagnosis not present

## 2018-05-22 MED ORDER — GALLIUM GA 68 DOTATATE IV KIT
3.4000 | PACK | Freq: Once | INTRAVENOUS | Status: AC
  Administered 2018-05-22: 3.4 via INTRAVENOUS

## 2018-06-02 ENCOUNTER — Ambulatory Visit (INDEPENDENT_AMBULATORY_CARE_PROVIDER_SITE_OTHER): Payer: Medicare Other | Admitting: Thoracic Surgery (Cardiothoracic Vascular Surgery)

## 2018-06-02 VITALS — BP 130/66 | HR 74 | Resp 20 | Ht 60.25 in | Wt 130.0 lb

## 2018-06-02 DIAGNOSIS — R918 Other nonspecific abnormal finding of lung field: Secondary | ICD-10-CM | POA: Diagnosis not present

## 2018-06-02 NOTE — Progress Notes (Signed)
[address]       [place] [address]             [phone]     HPI: Chariti Havel returns to discuss the results of her gallium dotatate scan  Mrs. 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  Back in July 22, 2017 she had a follow-up CT.  It showed a 10 mm nodule.  I saw her back in January and the nodule had decreased in size.  She return in July for a six-month follow-up.  She was feeling well.  A CT showed multiple lung nodules including a new nodule in the left lower lobe.  We decided to do a dotatate scan to see if these nodules might be carcinoids.  Past Medical History:  Diagnosis Date  . Arthritis 1996  . Atrial fibrillation (HCC)    on coumadin  . Atrial tachycardia ([place])   . Cancer [facility]) 1988   breast cancer, R mastectomy, chemo  . 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 Diverticulitis 1999  . Femur fracture ([place]) 03/2013   left, non weight bearing for 2 1/2 weeks  . GERD (gastroesophageal reflux disease)   . Hemorrhoids 1991  . HTN (hypertension)   . Lymphedema of upper extremity    right arm  . Osteoporosis   . PONV (postoperative nausea and vomiting)   . Urinary, incontinence, stress female   . Vitamin D deficiency disease     Current Outpatient Medications  Medication Sig Dispense Refill  . acetaminophen (TYLENOL) 500 MG tablet Take 2 tablets (1,000 mg total) by mouth every 6 (six) hours as needed for mild pain. 30 tablet 0  . amLODipine (NORVASC) 10 MG tablet Take 10 mg by mouth daily.    Marland Kitchen apixaban (ELIQUIS) 5 MG TABS tablet Take 5 mg by mouth 2 (two) times daily.    . Calcium Carbonate-Vitamin D (CALTRATE 600+D) 600-400 MG-UNIT per tablet Take 1 tablet by mouth daily.    . carboxymethylcellulose (REFRESH PLUS) 0.5 % SOLN Place 1 drop into both eyes 2 (two) times daily as needed (for dry eyes).    . carvedilol  (COREG) 6.25 MG tablet Take 1 tablet (6.25 mg total) by mouth 2 (two) times daily with a meal. 60 tablet 1  . Ergocalciferol (VITAMIN D2) 400 units TABS Take 400 Units by mouth daily.    . Estradiol (VAGIFEM) 10 MCG TABS vaginal tablet Place 1 tablet (10 mcg total) vaginally 2 (two) times a week. 24 tablet 3  . Multiple Vitamin (MULTIVITAMIN WITH MINERALS) TABS Take 1 tablet by mouth every evening.    . Omega-3 Fatty Acids (FISH OIL) 1000 MG CAPS Take 1 capsule by mouth 2 (two) times daily.    Marland Kitchen telmisartan (MICARDIS) 80 MG tablet Take 80 mg by mouth daily.     No current facility-administered medications for this visit.     Physical Exam BP 130/66   Pulse 74   Resp 20   Ht 5' 0.25" (1.53 m)   Wt 130 lb (59 kg)   LMP 10/16/1975 (Approximate)   SpO2 94% Comment: RA  BMI 25.50 kg/m  84 year old woman in no acute distress Alert and oriented x3 with no focal deficits Lungs clear bilaterally  Diagnostic Tests: NUCLEAR MEDICINE PET SKULL BASE TO THIGH  TECHNIQUE: 3.4 mCi Ga 68 DOTATATE was injected  intravenously. Full-ring PET imaging was performed from the skull base to thigh after the radiotracer. CT data was obtained and used for attenuation correction and anatomic localization.  COMPARISON:  CT thorax 05/06/2018, FDG PET scan 11/16/2015, CT scan 11/05/2017  FINDINGS: NECK  No radiotracer activity in neck lymph nodes.  Incidental CT findings: None  CHEST  The lobular nodule at the LEFT lung base measures 10 mm (image 50/8) and has faint radiotracer activity (SUV max equal 1.0). Likewise, lobular nodules in the RIGHT lower lobe have faint radiotracer activity. For example 12 mm lobular nodule on image 34/8 with SUV max equal 0.9.  No new nodules in short interval.  Dense medial RIGHT lower  lung  atelectasis unchanged  Incidental CT finding:No enlarged suspicious mediastinal lymph nodes.  ABDOMEN/PELVIS  No abnormal radiotracer activity in liver.  Abnormal radiotracer activity and abdominopelvic lymph nodes. No enlarged lymph nodes.  Physiologic activity noted in the liver, spleen, adrenal glands and kidneys.  Incidental CT findings:Atherosclerotic calcification of the aorta.  SKELETON  No focal activity to suggest skeletal metastasis.  Incidental CT findings:None  IMPRESSION: 1. Very low somatostatin receptor specific radiotracer (DOTATATE) activity associated with the lobular nodules at lung bases. Lesions remain indeterminate but concerning for carcinoid/neuroendocrine tumor. Of note, neuroendocrine tumors of non GI origin can have lower DOTATATE avidity. 2. No evidence of malignancy outside of the lungs.   Electronically Signed   By: Suzy Bouchard M.D.   On: 05/23/2018 10:04   Impression: Mrs. Ost is an 84 year old woman with a history of a right lower lobectomy for a stage IA carcinoid tumor in 2017.  She now has some waxing and waning lung nodules.  Her most recent scan showed a new nodule in the left lower lobe that was felt to be suspicious.  Unfortunately dotatate scan did not provide a definitive answer one way or the other.  The lesions were undetermined.  I discussed several potential options with Mrs. Abbs.  One was continued follow-up.  Another option would be an attempt to do a bronchoscopic biopsy or a CT-guided needle biopsy.  I do not think surgical resections are best option in her case.  After discussing the options and noting that she has had lung nodules that have waxed and waned previously we opted to plan for a short interval follow-up scan in 2 months.  Plan: Return in 2 months with CT chest  Melrose Nakayama, MD [facility] [phone]

## 2018-06-06 ENCOUNTER — Other Ambulatory Visit: Payer: Medicare Other

## 2018-07-03 ENCOUNTER — Other Ambulatory Visit: Payer: Self-pay | Admitting: *Deleted

## 2018-07-03 DIAGNOSIS — R918 Other nonspecific abnormal finding of lung field: Secondary | ICD-10-CM

## 2018-07-17 ENCOUNTER — Encounter: Payer: Self-pay | Admitting: Certified Nurse Midwife

## 2018-07-17 ENCOUNTER — Other Ambulatory Visit: Payer: Self-pay

## 2018-07-17 ENCOUNTER — Ambulatory Visit (INDEPENDENT_AMBULATORY_CARE_PROVIDER_SITE_OTHER): Payer: Medicare Other | Admitting: Certified Nurse Midwife

## 2018-07-17 VITALS — BP 110/70 | HR 68 | Resp 16 | Ht 59.5 in | Wt 130.0 lb

## 2018-07-17 DIAGNOSIS — N952 Postmenopausal atrophic vaginitis: Secondary | ICD-10-CM

## 2018-07-17 DIAGNOSIS — Z01419 Encounter for gynecological examination (general) (routine) without abnormal findings: Secondary | ICD-10-CM

## 2018-07-17 MED ORDER — ESTRADIOL 10 MCG VA TABS: 1.0000 | ORAL_TABLET | VAGINAL | 3 refills | Status: DC

## 2018-07-17 NOTE — Progress Notes (Signed)
84 y.o. [id number] Widowed  Caucasian Fe here for annual exam. Menopausal using coconut oil for vaginal dryness and skin protection with occasional stress incontinence. Feels it is working well and no irritation now. Also using in rectal area for hemorrhoid issues with good results. Sees Dr. Brigitte Pulse yearly for labs and medication management of Hypertension, cholesterol, Vitamin D, osteoporosis. All stable per patient and no hospital visits in past year. Living at [facility] now and adjusting very well. Still drives and is active. Good appetite and eats well. No other health issues today. Wearing compression hose on legs all the time with good results with walking.  Patient's last menstrual period was 10/16/1975 (approximate).          Sexually active: No.  The current method of family planning is status post hysterectomy.    Exercising: Yes.      Smoker:  no  Review of Systems  Constitutional: Negative.   HENT: Negative.   Eyes: Negative.   Respiratory: Negative.   Cardiovascular: Negative.   Gastrointestinal: Negative.   Genitourinary: Negative.   Musculoskeletal: Negative.   Skin: Negative.   Neurological: Negative.   Endo/Heme/Allergies: Negative.   Psychiatric/Behavioral: Negative.     Health Maintenance: Pap:  05-10-09 neg History of Abnormal Pap: no MMG:  2019 waiting on fax Self Breast exams: no Colonoscopy:  2017 normal BMD:   2018 with PCP TDaP:  2011 Shingles: 2007 Pneumonia: 2003 Hep C and HIV: not done Labs: no   reports that she has never smoked. She has never used smokeless tobacco. She reports that she does not drink alcohol or use drugs.  Past Medical History:  Diagnosis Date  . Arthritis 1996  . Atrial fibrillation (HCC)    on coumadin  . Atrial tachycardia ([place])   . Cancer [facility]) 1988   breast cancer, R mastectomy, chemo  . 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 Diverticulitis 1999  . Femur fracture ([place])  03/2013   left, non weight bearing for 2 1/2 weeks  . GERD (gastroesophageal reflux disease)   . Hemorrhoids 1991  . HTN (hypertension)   . Lymphedema of upper extremity    right arm  . Osteoporosis   . PONV (postoperative nausea and vomiting)   . Urinary, incontinence, stress female   . Vitamin D deficiency disease     Past Surgical History:  Procedure Laterality Date  . APPENDECTOMY    . BREAST BIOPSY  05/04/1987   Right Dr Annamaria Boots  . BREAST IMPLANT REMOVAL Right 06/05/10  . BUNIONECTOMY  1988  . carcinoid Right    Right lower lobectomy 2017  . COLONOSCOPY W/ POLYPECTOMY    . EYE SURGERY  5/14 ; 6/14   Cataract Surgery  . JOINT REPLACEMENT     2002 left; 2009 right Hip, rt knee 2012  . KNEE ARTHROPLASTY Right 2012   [facility]  . LOBECTOMY Right 01/05/2016   Procedure: RIGHT LOWER LOBE LOBECTOMY;  Surgeon: Melrose Nakayama, MD;  Location: [place];  Service: Thoracic;  Laterality: Right;  . MASTECTOMY PARTIAL / LUMPECTOMY W/ AXILLARY LYMPHADENECTOMY  04/05/1987   Right - Dr Annamaria Boots  . RESECTION OF MEDIASTINAL MASS N/A 01/05/2016   Procedure: RESECTION OF PLEURAL MASS, right;  Surgeon: Melrose Nakayama, MD;  Location: [place];  Service: Thoracic;  Laterality: N/A;  . TONSILLECTOMY    . TOTAL HIP ARTHROPLASTY    . TUBAL LIGATION Bilateral 1970  . VAGINAL HYSTERECTOMY  1977   myomata  . VIDEO ASSISTED THORACOSCOPY (VATS)/WEDGE RESECTION Right 01/05/2016   Procedure: RIGHT VIDEO ASSISTED THORACOSCOPY (VATS)/WEDGE RESECTION;  Surgeon: Melrose Nakayama, MD;  Location: [facility];  Service: Thoracic;  Laterality: Right;    Current Outpatient Medications  Medication Sig Dispense Refill  . acetaminophen (TYLENOL) 500 MG tablet Take 2 tablets (1,000 mg total) by mouth every 6 (six) hours as needed for mild pain. 30 tablet 0  . amLODipine (NORVASC) 10 MG tablet Take 10 mg by mouth daily.    Marland Kitchen apixaban (ELIQUIS) 5 MG TABS tablet Take 5 mg by mouth 2 (two) times daily.    . Calcium  Carbonate-Vitamin D (CALTRATE 600+D) 600-400 MG-UNIT per tablet Take 1 tablet by mouth daily.    . carboxymethylcellulose (REFRESH PLUS) 0.5 % SOLN Place 1 drop into both eyes 2 (two) times daily as needed (for dry eyes).    . carvedilol (COREG) 6.25 MG tablet Take 1 tablet (6.25 mg total) by mouth 2 (two) times daily with a meal. 60 tablet 1  . Ergocalciferol (VITAMIN D2) 400 units TABS Take 400 Units by mouth daily.    . Estradiol (VAGIFEM) 10 MCG TABS vaginal tablet Place 1 tablet (10 mcg total) vaginally 2 (two) times a week. 24 tablet 3  . Multiple Vitamin (MULTIVITAMIN WITH MINERALS) TABS Take 1 tablet by mouth every evening.    . Omega-3 Fatty Acids (FISH OIL) 1000 MG CAPS Take 1 capsule by mouth 2 (two) times daily.    Marland Kitchen telmisartan (MICARDIS) 80 MG tablet Take 80 mg by mouth daily.     No current facility-administered medications for this visit.     Family History  Problem Relation Age of Onset  . Colon cancer Mother   . Colon cancer Father   . Heart disease Father     ROS:  Pertinent items are noted in HPI.  Otherwise, a comprehensive ROS was negative.  Exam:   BP 110/70   Pulse 68   Resp 16   Ht 4' 11.5" (1.511 m)   Wt 130 lb (59 kg)   LMP 10/16/1975 (Approximate)   BMI 25.82 kg/m  Height: 4' 11.5" (151.1 cm) Ht Readings from Last 3 Encounters:  07/17/18 4' 11.5" (1.511 m)  06/02/18 5' 0.25" (1.53 m)  05/06/18 5' 0.25" (1.53 m)    General appearance: alert, cooperative and appears stated age Head: Normocephalic, without obvious abnormality, atraumatic Neck: no adenopathy, supple, symmetrical, trachea midline and thyroid normal to inspection and palpation Lungs: clear to auscultation bilaterally Breasts: normal appearance, no masses or tenderness, No nipple retraction or dimpling, No nipple discharge or bleeding, No axillary or supraclavicular adenopathy Heart: regular rate and rhythm Abdomen: soft, non-tender; no masses,  no organomegaly Extremities: extremities  normal, atraumatic, no cyanosis or edema Skin: Skin color, texture, turgor normal. No rashes or lesions Lymph nodes: Cervical, supraclavicular, and axillary nodes normal. No abnormal inguinal nodes palpated Neurologic: Grossly normal   Pelvic: External genitalia:  no lesions              Urethra:  normal appearing urethra with no masses, tenderness or lesions              Bartholin's and Skene's: normal                 Vagina: normal appearing vagina with normal color and discharge, no lesions              Cervix: absent  Pap taken: No. Bimanual Exam:  Uterus:  uterus absent              Adnexa: normal adnexa and no mass, fullness, tenderness               Rectovaginal: Confirms               Anus:  normal sphincter tone, no lesions  Chaperone present: yes  A:  Well Woman with normal exam  Post menopausal with atrophic vaginitis, using Vagifem with good results  Resides in assisted living now  Hypertension, cholesterol, Vitamin D and osteoporosis with PCP management  P:   Reviewed health and wellness pertinent to exam  Discussed risks and benefits of Vagifem and requests continuance .  Rx Vagifem see order with instructions  Continue follow up with PCP as indicated  Pap smear: no   counseled on breast self exam, mammography screening, feminine hygiene, adequate intake of calcium and vitamin D, diet and exercise, Kegel's exercises  return annually or prn  An After Visit Summary was printed and given to the patient.

## 2018-07-17 NOTE — Patient Instructions (Signed)

## 2018-07-21 ENCOUNTER — Encounter: Payer: Self-pay | Admitting: Certified Nurse Midwife

## 2018-08-05 ENCOUNTER — Other Ambulatory Visit: Payer: Self-pay

## 2018-08-05 ENCOUNTER — Ambulatory Visit (INDEPENDENT_AMBULATORY_CARE_PROVIDER_SITE_OTHER): Payer: Medicare Other | Admitting: Thoracic Surgery (Cardiothoracic Vascular Surgery)

## 2018-08-05 ENCOUNTER — Ambulatory Visit
Admission: RE | Admit: 2018-08-05 | Discharge: 2018-08-05 | Disposition: A | Discharge status: Home / Self Care | Payer: Medicare Other | Source: Ambulatory Visit | Attending: Thoracic Surgery (Cardiothoracic Vascular Surgery) | Admitting: Thoracic Surgery (Cardiothoracic Vascular Surgery)

## 2018-08-05 ENCOUNTER — Encounter: Payer: Self-pay | Admitting: Thoracic Surgery (Cardiothoracic Vascular Surgery)

## 2018-08-05 VITALS — BP 140/80 | HR 68 | Resp 16 | Ht 59.5 in | Wt 130.0 lb

## 2018-08-05 DIAGNOSIS — R918 Other nonspecific abnormal finding of lung field: Secondary | ICD-10-CM

## 2018-08-05 DIAGNOSIS — Z902 Acquired absence of lung [part of]: Secondary | ICD-10-CM

## 2018-08-05 DIAGNOSIS — C7A090 Malignant carcinoid tumor of the bronchus and lung: Secondary | ICD-10-CM | POA: Diagnosis not present

## 2018-08-05 NOTE — Progress Notes (Signed)
[address]       [place] [address]             [phone]    HPI: Mrs. Delauder returns for follow-up of multiple lung nodules.  Tayvia Faughnan is a an 84 year old woman with a history of stage IA carcinoid tumor treated with a thoracoscopic right lower lobectomy in March 2017.  She has been followed since then.  I saw her in July and she had multiple new lung nodules.  We did a PET dotatate scan.  There was minimal if any activity in the nodules.  She now returns for 8-week follow-up scan.  She has been feeling well.  No cough, wheezing, or shortness of breath.  Weight is stable.  Past Medical History:  Diagnosis Date  . Arthritis 1996  . Atrial fibrillation (HCC)    on coumadin  . Atrial tachycardia ([place])   . Cancer [facility]) 1988   breast cancer, R mastectomy, chemo  . 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 Diverticulitis 1999  . Femur fracture ([place]) 03/2013   left, non weight bearing for 2 1/2 weeks  . GERD (gastroesophageal reflux disease)   . Hemorrhoids 1991  . HTN (hypertension)   . Lymphedema of upper extremity    right arm  . Osteoporosis   . PONV (postoperative nausea and vomiting)   . Urinary, incontinence, stress female   . Vitamin D deficiency disease     Current Outpatient Medications  Medication Sig Dispense Refill  . acetaminophen (TYLENOL) 500 MG tablet Take 2 tablets (1,000 mg total) by mouth every 6 (six) hours as needed for mild pain. 30 tablet 0  . amLODipine (NORVASC) 10 MG tablet Take 10 mg by mouth daily.    Marland Kitchen apixaban (ELIQUIS) 5 MG TABS tablet Take 5 mg by mouth 2 (two) times daily.    . Calcium Carbonate-Vitamin D (CALTRATE 600+D) 600-400 MG-UNIT per tablet Take 1 tablet by mouth daily.    . carboxymethylcellulose (REFRESH PLUS) 0.5 % SOLN Place 1 drop into both eyes 2 (two) times daily as needed (for dry eyes).    . carvedilol (COREG) 6.25 MG tablet Take 1 tablet (6.25 mg total) by  mouth 2 (two) times daily with a meal. 60 tablet 1  . Ergocalciferol (VITAMIN D2) 400 units TABS Take 400 Units by mouth daily.    . Estradiol (VAGIFEM) 10 MCG TABS vaginal tablet Place 1 tablet (10 mcg total) vaginally 2 (two) times a week. 24 tablet 3  . Multiple Vitamin (MULTIVITAMIN WITH MINERALS) TABS Take 1 tablet by mouth every evening.    . Omega-3 Fatty Acids (FISH OIL) 1000 MG CAPS Take 1 capsule by mouth 2 (two) times daily.    Marland Kitchen telmisartan (MICARDIS) 80 MG tablet Take 80 mg by mouth daily.     No current facility-administered medications for this visit.     Physical Exam BP 140/80 (BP Location: Left Arm, Patient Position: Sitting, Cuff Size: Normal) Comment (Cuff Size): manually  Pulse 68   Resp 16   Ht 4' 11.5" (1.511 m)   Wt 130 lb (59 kg)   LMP 10/16/1975 (Approximate)   SpO2 94% Comment: RA  BMI 25.86 kg/m  84 year old woman in no acute distress Alert and oriented x3 with no focal deficits Lungs clear with equal breath sounds bilaterally No cervical or subclavicular adenopathy   Diagnostic Tests: CT CHEST WITHOUT CONTRAST  TECHNIQUE: Multidetector CT  imaging of the chest was performed using thin slice collimation for electromagnetic bronchoscopy planning purposes, without intravenous contrast.  COMPARISON:  PET-CT 05/22/2018 and chest CT 05/06/2018.  FINDINGS: Cardiovascular: The heart is normal in size. No pericardial effusion. Stable tortuosity and calcification of the thoracic aorta. Stable dense coronary artery calcifications.  Mediastinum/Nodes: No mediastinal or hilar mass or lymphadenopathy. Small amount of fluid in the pericardial recesses.  Lungs/Pleura: Stable surgical changes from a prior right lower lobe lobectomy. Stable loculated fluid in the pleural space.  Somewhat branching nodule in the right upper lobe on image number 33 measures 4 mm and is stable.  4.5 mm branching nodule in the left upper lobe on image number 43  is stable.  Tubular branching nodular density in the right lower lung on image number 101 has a slightly different configuration and measures approximately 18 x 7 mm.  The left lower lobe lesion seen on the prior CT scan which measured 10.5 x 11 mm has near completely resolved. Minimal residual nodularity.  Patchy areas of tree-in-bud appearance suggesting chronic inflammation mainly in the right upper lung.  Stable emphysematous changes, pulmonary scarring and mosaic pattern of attenuation.  Upper Abdomen: No significant upper abdominal findings. Stable vascular calcifications. Stable cyst in the right hepatic lobe. Mild thickening of the adrenal glands without discrete lesion.  Musculoskeletal: No significant bony findings.  IMPRESSION: 1. Interval near complete resolution of the left lower lobe lesion seen on the prior CT scan. This is probably mucoid impaction that has resolved. 2. Elongated branching nodular density in the right lower lung is slightly longer and thinner than the prior studies. This is likely mucoid impaction. 3. Smaller branching nodules and in both lungs likely mucoid impaction. No interval change. 4. No new pulmonary lesions. 5. Mild chronic inflammatory changes and mosaic pattern of attenuation. 6. No mediastinal or hilar mass or adenopathy. 7. Stable postoperative changes involving the right hemithorax from a prior right lateral lower lobe lobectomy.  Followup pulmonary nodules.  History of right lower lobe lobectomy.   Electronically Signed   By: Marijo Sanes M.D.   On: 08/05/2018 16:13 I personally reviewed the CT chest and concur with the findings noted above.  In general the nodules are all improved to resolved.  Likely areas of mucoid impaction that have cleared.  Impression: Adiba Fargnoli is an 84 year old woman who had a thoracoscopic right lower lobectomy for stage Ia carcinoid tumor back in 2017.  She had been followed with  CT scans since that time.  Most recently in July she had multiple nodules in both lungs.  On PET dotatate there was little if any activity.  She now returns with a follow-up scan.  These lesions are all significantly improved and some completely resolved.  Findings are consistent with benign lesions.  No new suspicious lesions.  Plan: Return in 6 months with CT chest  Melrose Nakayama, MD [facility] [phone]

## 2018-11-20 ENCOUNTER — Other Ambulatory Visit: Payer: Self-pay | Admitting: Internal Medicine

## 2018-11-20 DIAGNOSIS — K7460 Unspecified cirrhosis of liver: Secondary | ICD-10-CM

## 2018-11-25 ENCOUNTER — Ambulatory Visit
Admission: RE | Admit: 2018-11-25 | Discharge: 2018-11-25 | Disposition: A | Discharge status: Home / Self Care | Payer: Medicare Other | Source: Ambulatory Visit | Attending: Internal Medicine | Admitting: Internal Medicine

## 2018-11-25 DIAGNOSIS — K7460 Unspecified cirrhosis of liver: Secondary | ICD-10-CM

## 2018-12-15 ENCOUNTER — Other Ambulatory Visit: Payer: Self-pay | Admitting: *Deleted

## 2018-12-15 DIAGNOSIS — R918 Other nonspecific abnormal finding of lung field: Secondary | ICD-10-CM

## 2018-12-15 NOTE — Progress Notes (Unsigned)
Ct ch 

## 2019-01-06 ENCOUNTER — Ambulatory Visit: Payer: Medicare Other | Admitting: Thoracic Surgery (Cardiothoracic Vascular Surgery)

## 2019-01-28 ENCOUNTER — Other Ambulatory Visit: Payer: Medicare Other

## 2019-02-03 ENCOUNTER — Ambulatory Visit: Payer: Medicare Other | Admitting: Thoracic Surgery (Cardiothoracic Vascular Surgery)

## 2019-03-03 ENCOUNTER — Encounter: Payer: Self-pay | Admitting: Thoracic Surgery (Cardiothoracic Vascular Surgery)

## 2019-03-03 ENCOUNTER — Ambulatory Visit (INDEPENDENT_AMBULATORY_CARE_PROVIDER_SITE_OTHER): Payer: Medicare Other | Admitting: Thoracic Surgery (Cardiothoracic Vascular Surgery)

## 2019-03-03 ENCOUNTER — Other Ambulatory Visit: Payer: Self-pay

## 2019-03-03 ENCOUNTER — Ambulatory Visit
Admission: RE | Admit: 2019-03-03 | Discharge: 2019-03-03 | Disposition: A | Discharge status: Home / Self Care | Payer: Medicare Other | Source: Ambulatory Visit | Attending: Thoracic Surgery (Cardiothoracic Vascular Surgery) | Admitting: Thoracic Surgery (Cardiothoracic Vascular Surgery)

## 2019-03-03 VITALS — BP 144/78 | HR 57 | Temp 98.4°F | Resp 16 | Ht 59.5 in | Wt 132.0 lb

## 2019-03-03 DIAGNOSIS — R918 Other nonspecific abnormal finding of lung field: Secondary | ICD-10-CM

## 2019-03-03 DIAGNOSIS — Z902 Acquired absence of lung [part of]: Secondary | ICD-10-CM | POA: Diagnosis not present

## 2019-03-03 NOTE — Progress Notes (Signed)
[address]       [place] [address]             [phone]     HPI: Mrs. 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  She also has a history of hypertension, reflux, right breast cancer, chronic atrial fibrillation, vitamin D deficiency, and osteoporosis.  In July 2019 she had multiple new lung nodules.  A PET dotatate scan showed no activity.  On a follow-up CT in October 2019 the lesions were significantly improved and some completely resolved.  In the interim since her last visit she has been feeling well.  She stays at [facility].  She has been isolating herself.  She did drive herself to the appointment today.  She has not had any respiratory issues.  She denies any chest pain, pressure, or tightness.  Past Medical History:  Diagnosis Date  . Arthritis 1996  . Atrial fibrillation (HCC)    on coumadin  . Atrial tachycardia ([place])   . Cancer [facility]) 1988   breast cancer, R mastectomy, chemo  . 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 Diverticulitis 1999  . Femur fracture ([place]) 03/2013   left, non weight bearing for 2 1/2 weeks  . GERD (gastroesophageal reflux disease)   . Hemorrhoids 1991  . HTN (hypertension)   . Lymphedema of upper extremity    right arm  . Osteoporosis   . PONV (postoperative nausea and vomiting)   . Urinary, incontinence, stress female   . Vitamin D deficiency disease      Current Outpatient Medications  Medication Sig Dispense Refill  . acetaminophen (TYLENOL) 500 MG tablet Take 2 tablets (1,000 mg total) by mouth every 6 (six) hours as needed for mild pain. 30 tablet 0  . amLODipine (NORVASC) 10 MG tablet Take 10 mg by mouth daily.    Marland Kitchen apixaban (ELIQUIS) 5 MG TABS tablet Take 5 mg by mouth 2 (two) times daily.    . Calcium Carbonate-Vitamin D (CALTRATE  600+D) 600-400 MG-UNIT per tablet Take 1 tablet by mouth daily.    . carboxymethylcellulose (REFRESH PLUS) 0.5 % SOLN Place 1 drop into both eyes 2 (two) times daily as needed (for dry eyes).    . carvedilol (COREG) 6.25 MG tablet Take 1 tablet (6.25 mg total) by mouth 2 (two) times daily with a meal. 60 tablet 1  . Ergocalciferol (VITAMIN D2) 400 units TABS Take 400 Units by mouth daily.    . Estradiol (VAGIFEM) 10 MCG TABS vaginal tablet Place 1 tablet (10 mcg total) vaginally 2 (two) times a week. 24 tablet 3  . Multiple Vitamin (MULTIVITAMIN WITH MINERALS) TABS Take 1 tablet by mouth every evening.    . Omega-3 Fatty Acids (FISH OIL) 1000 MG CAPS Take 1 capsule by mouth 2 (two) times daily.    Marland Kitchen telmisartan (MICARDIS) 80 MG tablet Take 80 mg by mouth daily.     No current facility-administered medications for this visit.     Physical Exam BP (!) 144/78 (BP Location: Left Arm, Patient Position: Sitting, Cuff Size: Normal)   Pulse (!) 57   Temp 98.4 F (36.9 C) (Oral)   Resp 16   Ht 4' 11.5" (1.511 m)   Wt 132 lb (59.9 kg)   LMP 10/16/1975 (Approximate)  SpO2 93% Comment: ON RA  BMI 26.74 kg/m  85 year old woman in no acute distress Alert and oriented x3 no focal deficits Lungs diminished at right base, otherwise clear Cardiac irregular regular rate and rhythm No peripheral edema Wearing surgical mask  Diagnostic Tests: CT CHEST WITHOUT CONTRAST  TECHNIQUE: Multidetector CT imaging of the chest was performed following the standard protocol without IV contrast.  COMPARISON:  Chest CT 08/05/2018.  FINDINGS: Cardiovascular: Heart size is enlarged with biatrial dilatation. There is no significant pericardial fluid, thickening or pericardial calcification. There is aortic atherosclerosis, as well as atherosclerosis of the great vessels of the mediastinum and the coronary arteries, including calcified atherosclerotic plaque in the left main, left anterior descending,  left circumflex and right coronary arteries.  Mediastinum/Nodes: No pathologically enlarged mediastinal or hilar lymph nodes. Please note that accurate exclusion of hilar adenopathy is limited on noncontrast CT scans. Esophagus is unremarkable in appearance. No axillary lymphadenopathy. Surgical clips in the right axilla from prior lymph node dissection.  Lungs/Pleura: Status post right lower lobectomy. Compensatory hyperexpansion of the right middle and upper lobes. Similar size and number of multiple small pulmonary nodules scattered throughout the lungs bilaterally, as well as several elongated branching structures, most of which appear to reflect areas of chronic mucoid impaction. No new suspicious appearing pulmonary nodules or masses are noted. No acute consolidative airspace disease. Chronic pleural thickening and trace amount of pleural fluid in the right hemithorax similar to prior examinations. Mosaic attenuation throughout the lung parenchyma likely to reflect areas of air trapping.  Upper Abdomen: Aortic atherosclerosis. 1.7 cm low-attenuation lesion in the right lobe of the liver as well as several smaller well-defined low-attenuation lesions, incompletely characterized on today's non-contrast CT examination, but statistically likely represent cysts.  Musculoskeletal: Status post right modified radical mastectomy and right axillary lymph node dissection. There are no aggressive appearing lytic or blastic lesions noted in the visualized portions of the skeleton.  IMPRESSION: 1. Stable postoperative appearance of the chest in this patient status post right lower lobectomy with multiple tiny nonspecific pulmonary nodules measuring 4 mm or less in size and areas of apparent mucoid impaction, all of which are strongly favored to be benign. Continued attention on follow-up studies is suggested to ensure stability. 2. Cardiomegaly with biatrial dilatation. 3. Aortic  atherosclerosis, in addition to left main and 3 vessel coronary artery disease. 4. Additional incidental findings, as above.  Aortic Atherosclerosis (ICD10-I70.0).   Electronically Signed   By: Vinnie Langton M.D.   On: 03/03/2019 15:07 I personally reviewed the CT images and concur with the findings noted above  Impression: [name] is an 85 year old woman with a past medical history significant for hypertension, reflux, right breast cancer, chronic atrial fibrillation, vitamin D deficiency, and osteoporosis.  She had a thoracoscopic right lower lobectomy for a stage IA low-grade neuroendocrine/carcinoid tumor in March 2017.  Carcinoid tumor-now 3 years out with no evidence of recurrent disease.  No suspicious nodules on her scan today.  We will plan a repeat CT in 3 months.  Hypertension-blood pressure mildly elevated.  She is on Coreg, Norvasc, and Micardis.  Atrial fibrillation-heart rate mildly bradycardic.  On Coreg.  Also on Eliquis.  Plan: Return in 1 year with CT chest  Melrose Nakayama, MD [facility] [phone]

## 2019-05-25 ENCOUNTER — Other Ambulatory Visit: Payer: Self-pay | Admitting: Gastroenterology

## 2019-05-25 DIAGNOSIS — R932 Abnormal findings on diagnostic imaging of liver and biliary tract: Secondary | ICD-10-CM

## 2019-06-02 ENCOUNTER — Ambulatory Visit
Admission: RE | Admit: 2019-06-02 | Discharge: 2019-06-02 | Disposition: A | Discharge status: Home / Self Care | Payer: Medicare Other | Source: Ambulatory Visit | Attending: Gastroenterology | Admitting: Gastroenterology

## 2019-06-02 DIAGNOSIS — R932 Abnormal findings on diagnostic imaging of liver and biliary tract: Secondary | ICD-10-CM

## 2019-06-02 MED ORDER — IOPAMIDOL (ISOVUE-300) INJECTION 61%
100.0000 mL | Freq: Once | INTRAVENOUS | Status: AC | PRN
  Administered 2019-06-02: 100 mL via INTRAVENOUS

## 2019-07-20 ENCOUNTER — Other Ambulatory Visit: Payer: Self-pay

## 2019-07-21 NOTE — Progress Notes (Signed)
85 y.o. [id number] Widowed Caucasian Fe here for annual exam. Post menopause with  vaginal dryness. Using coconut oil and Vagifem twice weekly vaginal bleeding. Occasional urine leakage with caffeine use once or twice weekly. Wears pad daily which with no problems. Sees Dr Brigitte Pulse once yearly. Had BMD again this year and no Reclast restarted. Sees Dr. Roxan Hockey for lung nodule, no change. No other health issues today.  Patient's last menstrual period was 10/16/1975 (approximate).          Sexually active: No.  The current method of family planning is status post hysterectomy.    Exercising: Yes.    walking Smoker:  no  Review of Systems  Constitutional: Negative.   HENT: Negative.   Eyes: Negative.   Respiratory: Negative.   Cardiovascular: Negative.   Gastrointestinal: Negative.   Genitourinary: Negative.   Musculoskeletal: Negative.   Skin: Negative.   Neurological: Negative.   Endo/Heme/Allergies: Negative.   Psychiatric/Behavioral: Negative.     Health Maintenance: Pap:  05-10-09 neg History of Abnormal Pap: no MMG:  2020 neg per patient Self Breast exams: occ Colonoscopy:  2017 normal BMD:   2020 with pcp TDaP:  2011 Shingles: 2007 Pneumonia: 2003 Hep C and HIV: not done Labs: with PCP   reports that she has never smoked. She has never used smokeless tobacco. She reports that she does not drink alcohol or use drugs.  Past Medical History:  Diagnosis Date  . Arthritis 1996  . Atrial fibrillation (HCC)    on coumadin  . Atrial tachycardia ([place])   . Cancer [facility]) 1988   breast cancer, R mastectomy, chemo  . 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 Diverticulitis 1999  . Femur fracture ([place]) 03/2013   left, non weight bearing for 2 1/2 weeks  . GERD (gastroesophageal reflux disease)   . Hemorrhoids 1991  . HTN (hypertension)   . Lymphedema of upper extremity    right arm  . Osteoporosis   . PONV (postoperative nausea and  vomiting)   . Urinary, incontinence, stress female   . Vitamin D deficiency disease     Past Surgical History:  Procedure Laterality Date  . APPENDECTOMY    . BREAST BIOPSY  05/04/1987   Right Dr Annamaria Boots  . BREAST IMPLANT REMOVAL Right 06/05/10  . BUNIONECTOMY  1988  . carcinoid Right    Right lower lobectomy 2017  . COLONOSCOPY W/ POLYPECTOMY    . EYE SURGERY  5/14 ; 6/14   Cataract Surgery  . JOINT REPLACEMENT     2002 left; 2009 right Hip, rt knee 2012  . KNEE ARTHROPLASTY Right 2012   [facility]  . LOBECTOMY Right 01/05/2016   Procedure: RIGHT LOWER LOBE LOBECTOMY;  Surgeon: Melrose Nakayama, MD;  Location: [facility];  Service: Thoracic;  Laterality: Right;  . MASTECTOMY PARTIAL / LUMPECTOMY W/ AXILLARY LYMPHADENECTOMY  04/05/1987   Right - Dr Annamaria Boots  . RESECTION OF MEDIASTINAL MASS N/A 01/05/2016   Procedure: RESECTION OF PLEURAL MASS, right;  Surgeon: Melrose Nakayama, MD;  Location: [place];  Service: Thoracic;  Laterality: N/A;  . TONSILLECTOMY    . TOTAL HIP ARTHROPLASTY    . TUBAL LIGATION Bilateral 1970  . VAGINAL HYSTERECTOMY  1977   myomata  . VIDEO ASSISTED THORACOSCOPY (VATS)/WEDGE RESECTION Right 01/05/2016   Procedure: RIGHT VIDEO ASSISTED THORACOSCOPY (VATS)/WEDGE RESECTION;  Surgeon: Melrose Nakayama, MD;  Location: [facility];  Service: Thoracic;  Laterality: Right;  Current Outpatient Medications  Medication Sig Dispense Refill  . acetaminophen (TYLENOL) 500 MG tablet Take 2 tablets (1,000 mg total) by mouth every 6 (six) hours as needed for mild pain. 30 tablet 0  . amLODipine (NORVASC) 10 MG tablet Take 10 mg by mouth daily.    Marland Kitchen apixaban (ELIQUIS) 5 MG TABS tablet Take 5 mg by mouth 2 (two) times daily.    . Calcium Carbonate-Vitamin D (CALTRATE 600+D) 600-400 MG-UNIT per tablet Take 1 tablet by mouth daily.    . carboxymethylcellulose (REFRESH PLUS) 0.5 % SOLN Place 1 drop into both eyes 2 (two) times daily as needed (for dry eyes).    . carvedilol  (COREG) 6.25 MG tablet Take 1 tablet (6.25 mg total) by mouth 2 (two) times daily with a meal. 60 tablet 1  . Ergocalciferol (VITAMIN D2) 400 units TABS Take 400 Units by mouth daily.    . Estradiol (VAGIFEM) 10 MCG TABS vaginal tablet Place 1 tablet (10 mcg total) vaginally 2 (two) times a week. 24 tablet 3  . Multiple Vitamin (MULTIVITAMIN WITH MINERALS) TABS Take 1 tablet by mouth every evening.    . Omega-3 Fatty Acids (FISH OIL) 1000 MG CAPS Take 1 capsule by mouth 2 (two) times daily.    Marland Kitchen telmisartan (MICARDIS) 80 MG tablet Take 80 mg by mouth daily.    Marland Kitchen UNABLE TO FIND Macular supplement     No current facility-administered medications for this visit.     Family History  Problem Relation Age of Onset  . Colon cancer Mother   . Colon cancer Father   . Heart disease Father     ROS:  Pertinent items are noted in HPI.  Otherwise, a comprehensive ROS was negative.  Exam:   BP 136/80   Pulse 68   Temp (!) 97.1 F (36.2 C) (Skin)   Resp 16   Ht 4' 10.75" (1.492 m)   Wt 133 lb (60.3 kg)   LMP 10/16/1975 (Approximate)   BMI 27.09 kg/m  Height: 4' 10.75" (149.2 cm) Ht Readings from Last 3 Encounters:  07/22/19 4' 10.75" (1.492 m)  03/03/19 4' 11.5" (1.511 m)  08/05/18 4' 11.5" (1.511 m)    General appearance: alert, cooperative and appears stated age Head: Normocephalic, without obvious abnormality, atraumatic Neck: no adenopathy, supple, symmetrical, trachea midline and thyroid normal to inspection and palpation Lungs: clear to auscultation bilaterally Breasts: normal appearance, no masses or tenderness, No nipple retraction or dimpling, No nipple discharge or bleeding, No axillary or supraclavicular adenopathy, right mastectomy scarring, no change noted in scar area Heart: regular rate and rhythm Abdomen: soft, non-tender; no masses,  no organomegaly Extremities: extremities normal, atraumatic, no cyanosis or edema Skin: Skin color, texture, turgor normal. No rashes or  lesions Lymph nodes: Cervical, supraclavicular, and axillary nodes normal. No abnormal inguinal nodes palpated Neurologic: Grossly normal   Pelvic: External genitalia:  no lesions, atrophic appearance              Urethra:  normal appearing urethra with no masses, tenderness or lesions              Bartholin's and Skene's: normal                 Vagina:atrophic appearing vagina with normal color and normal discharge, no lesions              Cervix: absent              Pap taken: No. Bimanual  Exam:  Uterus:  uterus absent              Adnexa: normal adnexa and no mass, fullness, tenderness               Rectovaginal: Confirms               Anus:  normal sphincter tone, no lesions  Chaperone present: yes  A:  Well Woman with normal exam  Post menopausal no HRT s/p TVH ovaries retained  Atrophic vaginitis Vagifem working well request continuance  Occasional stress incontinence, but improved with Vagifem use and coconut oil  PCP management of hypertension, Vitamin D, osteoporosis.  Lung nodules continue under pulmonary management no change per patient  P:   Reviewed health and wellness pertinent to exam  Aware to call if vaginal bleeding or irritation with Vagifem use.  Risks/benefits/warnig signs given.  Rx Vagifem see order with instructions  Continue coconut oil use as needed for dryness and irritation  Continue MD follow up for other medical issues.  Pap smear: no   Reviewed health wellness regarding regular SBE and mammogram, diet/exercise as tolerated, calcium intake and Vitamin D. pap smear  return annually or prn  An After Visit Summary was printed and given to the patient.

## 2019-07-22 ENCOUNTER — Other Ambulatory Visit: Payer: Self-pay

## 2019-07-22 ENCOUNTER — Encounter: Payer: Self-pay | Admitting: Certified Nurse Midwife

## 2019-07-22 ENCOUNTER — Ambulatory Visit (INDEPENDENT_AMBULATORY_CARE_PROVIDER_SITE_OTHER): Payer: Medicare Other | Admitting: Certified Nurse Midwife

## 2019-07-22 VITALS — BP 136/80 | HR 68 | Temp 97.1°F | Resp 16 | Ht 58.75 in | Wt 133.0 lb

## 2019-07-22 DIAGNOSIS — Z853 Personal history of malignant neoplasm of breast: Secondary | ICD-10-CM

## 2019-07-22 DIAGNOSIS — Z01419 Encounter for gynecological examination (general) (routine) without abnormal findings: Secondary | ICD-10-CM | POA: Diagnosis not present

## 2019-07-22 DIAGNOSIS — Z8739 Personal history of other diseases of the musculoskeletal system and connective tissue: Secondary | ICD-10-CM | POA: Diagnosis not present

## 2019-07-22 DIAGNOSIS — N952 Postmenopausal atrophic vaginitis: Secondary | ICD-10-CM | POA: Diagnosis not present

## 2019-07-22 MED ORDER — ESTRADIOL 10 MCG VA TABS: 1.0000 | ORAL_TABLET | VAGINAL | 3 refills | Status: DC

## 2019-07-22 NOTE — Patient Instructions (Signed)
EXERCISE AND DIET:  We recommended that you start or continue a regular exercise program for good health. Regular exercise means any activity that makes your heart beat faster and makes you sweat.  We recommend exercising at least 30 minutes per day at least 3 days a week, preferably 4 or 5.  We also recommend a diet low in fat and sugar.  Inactivity, poor dietary choices and obesity can cause diabetes, heart attack, stroke, and kidney damage, among others.   ° °ALCOHOL AND SMOKING:  Women should limit their alcohol intake to no more than 7 drinks/beers/glasses of wine (combined, not each!) per week. Moderation of alcohol intake to this level decreases your risk of breast cancer and liver damage. And of course, no recreational drugs are part of a healthy lifestyle.  And absolutely no smoking or even second hand smoke. Most people know smoking can cause heart and lung diseases, but did you know it also contributes to weakening of your bones? Aging of your skin?  Yellowing of your teeth and nails? ° °CALCIUM AND VITAMIN D:  Adequate intake of calcium and Vitamin D are recommended.  The recommendations for exact amounts of these supplements seem to change often, but generally speaking 600 mg of calcium (either carbonate or citrate) and 800 units of Vitamin D per day seems prudent. Certain women may benefit from higher intake of Vitamin D.  If you are among these women, your doctor will have told you during your visit.   ° °PAP SMEARS:  Pap smears, to check for cervical cancer or precancers,  have traditionally been done yearly, although recent scientific advances have shown that most women can have pap smears less often.  However, every woman still should have a physical exam from her gynecologist every year. It will include a breast check, inspection of the vulva and vagina to check for abnormal growths or skin changes, a visual exam of the cervix, and then an exam to evaluate the size and shape of the uterus and  ovaries.  And after 83 years of age, a rectal exam is indicated to check for rectal cancers. We will also provide age appropriate advice regarding health maintenance, like when you should have certain vaccines, screening for sexually transmitted diseases, bone density testing, colonoscopy, mammograms, etc.  ° °MAMMOGRAMS:  All women over 40 years old should have a yearly mammogram. Many facilities now offer a "3D" mammogram, which may cost around $50 extra out of pocket. If possible,  we recommend you accept the option to have the 3D mammogram performed.  It both reduces the number of women who will be called back for extra views which then turn out to be normal, and it is better than the routine mammogram at detecting truly abnormal areas.   ° °COLONOSCOPY:  Colonoscopy to screen for colon cancer is recommended for all women at age 50.  We know, you hate the idea of the prep.  We agree, BUT, having colon cancer and not knowing it is worse!!  Colon cancer so often starts as a polyp that can be seen and removed at colonscopy, which can quite literally save your life!  And if your first colonoscopy is normal and you have no family history of colon cancer, most women don't have to have it again for 10 years.  Once every ten years, you can do something that may end up saving your life, right?  We will be happy to help you get it scheduled when you are ready.    Be sure to check your insurance coverage so you understand how much it will cost.  It may be covered as a preventative service at no cost, but you should check your particular policy.   ° ° ° °Atrophic Vaginitis °Atrophic vaginitis is a condition in which the tissues that line the vagina become dry and thin. This condition occurs in women who have stopped having their period. It is caused by a drop in a female hormone (estrogen). This hormone helps: °· To keep the vagina moist. °· To make a clear fluid. This clear fluid helps: °? To make the vagina ready for  sex. °? To protect the vagina from infection. °If the lining of the vagina is dry and thin, it may cause irritation, burning, or itchiness. It may also: °· Make sex painful. °· Make an exam of your vagina painful. °· Cause bleeding. °· Make you lose interest in sex. °· Cause a burning feeling when you pee (urinate). °· Cause a brown or yellow fluid to come from your vagina. °Some women do not have symptoms. °Follow these instructions at home: °Medicines °· Take over-the-counter and prescription medicines only as told by your doctor. °· Do not use herbs or other medicines unless your doctor says it is okay. °· Use medicines for for dryness. These include: °? Oils to make the vagina soft. °? Creams. °? Moisturizers. °General instructions °· Do not douche. °· Do not use products that can make your vagina dry. These include: °? Scented sprays. °? Scented tampons. °? Scented soaps. °· Sex can help increase blood flow and soften the tissue in the vagina. If it hurts to have sex: °? Tell your partner. °? Use products to make sex more comfortable. Use these only as told by your doctor. °Contact a doctor if you: °· Have discharge from the vagina that is different than usual. °· Have a bad smell coming from your vagina. °· Have new symptoms. °· Do not get better. °· Get worse. °Summary °· Atrophic vaginitis is a condition in which the lining of the vagina becomes dry and thin. °· This condition affects women who have stopped having their periods. °· Treatment may include using products that help make the vagina soft. °· Call a doctor if do not get better with treatment. °This information is not intended to replace advice given to you by your health care provider. Make sure you discuss any questions you have with your health care provider. °Document Released: 03/19/2008 Document Revised: 10/14/2017 Document Reviewed: 10/14/2017 °Elsevier Patient Education © 2020 [employer] Inc. ° °

## 2020-01-06 ENCOUNTER — Encounter: Payer: Self-pay | Admitting: Certified Nurse Midwife

## 2020-01-20 NOTE — Progress Notes (Signed)
Cardiology Office Note:   Date:  01/21/2020  NAME:  [name]    MRN: [id number] DOB:  Jun 12, 1934   PCP:  Marton Redwood, MD  Cardiologist:  Evalina Field, MD   Referring MD: Marton Redwood, MD   Chief Complaint  Patient presents with  . Shortness of Breath   History of Present Illness:   [name] is a 86 y.o. female with a hx of permanent Afib, HTN, cirrhosis, carcinoid lung tumor s/p resection who is being seen today for the evaluation of shortness of breath at the request of Marton Redwood, MD. Recent CT with dilated LV and biatrial enlargement. She reports she has been getting short of breath for 1 year. She reports she gets daily episodes where she just feels quite winded. It occurs with heavy exertion. She denies any chest pain or palpitations with it. She reports she sleeps upright and is done so for years. She does have some lower extremity edema and she wears compression stockings. She is never had a diagnosis of congestive heart failure. Regarding her atrial fibrillation she reports she was diagnosed with this after one of her hip procedures. She is remained in A. fib rate control without any limitations. She remains anticoagulated on Eliquis. She is tolerating this well. She does have a history of cirrhosis for unclear reasons. She is not a drinker. She is not diabetic. The suspicion is this is a fatty liver. She was started on Crestor by her primary care physician which is probably a good idea. Her blood pressure is well controlled today. She has no evidence of volume overload on examination. Her EKG simply demonstrates atrial fibrillation. Per the reports from her primary care physician office a chest x-ray was obtained in the office. Per Dr. Trena Platt interpretation there was no acute abnormality.  Problem List 1. Permanent Afib -CHADSVASC=4 2. Carcinoid lung tumor  3. Cirrhosis  4. Hyponatremia 5. HTN  Past Medical History: Past Medical History:  Diagnosis Date  .  Arthritis 1996  . Atrial fibrillation (HCC)    on coumadin  . Atrial tachycardia ([place])   . Cancer [facility]) 1988   breast cancer, R mastectomy, chemo  . 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 Diverticulitis 1999  . Femur fracture ([place]) 03/2013   left, non weight bearing for 2 1/2 weeks  . GERD (gastroesophageal reflux disease)   . Hemorrhoids 1991  . HTN (hypertension)   . Lymphedema of upper extremity    right arm  . Osteoporosis   . PONV (postoperative nausea and vomiting)   . Urinary, incontinence, stress female   . Vitamin D deficiency disease     Past Surgical History: Past Surgical History:  Procedure Laterality Date  . APPENDECTOMY    . BREAST BIOPSY  05/04/1987   Right Dr Annamaria Boots  . BREAST IMPLANT REMOVAL Right 06/05/10  . BUNIONECTOMY  1988  . carcinoid Right    Right lower lobectomy 2017  . COLONOSCOPY W/ POLYPECTOMY    . EYE SURGERY  5/14 ; 6/14   Cataract Surgery  . JOINT REPLACEMENT     2002 left; 2009 right Hip, rt knee 2012  . KNEE ARTHROPLASTY Right 2012   [facility]le  . LOBECTOMY Right 01/05/2016   Procedure: RIGHT LOWER LOBE LOBECTOMY;  Surgeon: Melrose Nakayama, MD;  Location: [place];  Service: Thoracic;  Laterality: Right;  . MASTECTOMY PARTIAL / LUMPECTOMY W/ AXILLARY LYMPHADENECTOMY  04/05/1987  Right - Dr Annamaria Boots  . RESECTION OF MEDIASTINAL MASS N/A 01/05/2016   Procedure: RESECTION OF PLEURAL MASS, right;  Surgeon: Melrose Nakayama, MD;  Location: [place];  Service: Thoracic;  Laterality: N/A;  . TONSILLECTOMY    . TOTAL HIP ARTHROPLASTY    . TUBAL LIGATION Bilateral 1970  . VAGINAL HYSTERECTOMY  1977   myomata  . VIDEO ASSISTED THORACOSCOPY (VATS)/WEDGE RESECTION Right 01/05/2016   Procedure: RIGHT VIDEO ASSISTED THORACOSCOPY (VATS)/WEDGE RESECTION;  Surgeon: Melrose Nakayama, MD;  Location: [place];  Service: Thoracic;  Laterality: Right;   Current Medications: Current Meds  Medication Sig  .  acetaminophen (TYLENOL) 500 MG tablet Take 2 tablets (1,000 mg total) by mouth every 6 (six) hours as needed for mild pain.  Marland Kitchen amLODipine (NORVASC) 10 MG tablet Take 10 mg by mouth daily.  Marland Kitchen apixaban (ELIQUIS) 5 MG TABS tablet Take 5 mg by mouth 2 (two) times daily.  . Calcium Carbonate-Vitamin D (CALTRATE 600+D) 600-400 MG-UNIT per tablet Take 1 tablet by mouth daily.  . carboxymethylcellulose (REFRESH PLUS) 0.5 % SOLN Place 1 drop into both eyes 2 (two) times daily as needed (for dry eyes).  . carvedilol (COREG) 6.25 MG tablet Take 1 tablet (6.25 mg total) by mouth 2 (two) times daily with a meal.  . Ergocalciferol (VITAMIN D2) 400 units TABS Take 400 Units by mouth daily.  . Estradiol (VAGIFEM) 10 MCG TABS vaginal tablet Place 1 tablet (10 mcg total) vaginally 2 (two) times a week.  . Multiple Vitamin (MULTIVITAMIN WITH MINERALS) TABS Take 1 tablet by mouth every evening.  . Omega-3 Fatty Acids (FISH OIL) 1000 MG CAPS Take 1 capsule by mouth 2 (two) times daily.  . rosuvastatin (CRESTOR) 10 MG tablet Take 10 mg by mouth at bedtime.  Marland Kitchen telmisartan (MICARDIS) 80 MG tablet Take 80 mg by mouth daily.  Marland Kitchen UNABLE TO FIND Macular supplement    Allergies:    Aspirin   Social History: Social History   Socioeconomic History  . Marital status: Widowed    Spouse name: Not on file  . Number of children: Not on file  . Years of education: Not on file  . Highest education level: Not on file  Occupational History  . Not on file  Tobacco Use  . Smoking status: Never Smoker  . Smokeless tobacco: Never Used  Substance and Sexual Activity  . Alcohol use: No    Alcohol/week: 0.0 standard drinks  . Drug use: No  . Sexual activity: Not Currently    Partners: Male    Birth control/protection: Surgical    Comment: hysterectomy  Other Topics Concern  . Not on file  Social History Narrative  . Not on file   Social Determinants of Health   Financial Resource Strain:   . Difficulty of Paying  Living Expenses:   Food Insecurity:   . Worried About Charity fundraiser in the Last Year:   . Arboriculturist in the Last Year:   Transportation Needs:   . Film/video editor (Medical):   Marland Kitchen Lack of Transportation (Non-Medical):   Physical Activity:   . Days of Exercise per Week:   . Minutes of Exercise per Session:   Stress:   . Feeling of Stress :   Social Connections:   . Frequency of Communication with Friends and Family:   . Frequency of Social Gatherings with Friends and Family:   . Attends Religious Services:   . Active Member of Clubs  or Organizations:   . Attends Archivist Meetings:   Marland Kitchen Marital Status:     Family History: The patient's family history includes Colon cancer in her father and mother; Heart disease in her father.  ROS:   All other ROS reviewed and negative. Pertinent positives noted in the HPI.     EKGs/Labs/Other Studies Reviewed:   The following studies were personally reviewed by me today:  EKG:  EKG is ordered today.  The ekg ordered today demonstrates atrial fibrillation, heart rate 67, left axis deviation, no evidence of prior infarction, and was personally reviewed by me.   Recent Labs: No results found for requested labs within last 8760 hours.   Recent Lipid Panel    Component Value Date/Time   CHOL 201 (HH) 04/09/2007 0915   TRIG 50 04/09/2007 0915   HDL 65.9 04/09/2007 0915   CHOLHDL 3.1 CALC 04/09/2007 0915   VLDL 10 04/09/2007 0915   LDLDIRECT 107.8 04/09/2007 0915    Physical Exam:   VS:  BP 130/68   Pulse 67   Ht 4' 10.5" (1.486 m)   Wt 137 lb (62.1 kg)   LMP 10/16/1975 (Approximate)   SpO2 97%   BMI 28.15 kg/m    Wt Readings from Last 3 Encounters:  01/21/20 137 lb (62.1 kg)  07/22/19 133 lb (60.3 kg)  03/03/19 132 lb (59.9 kg)    General: Well nourished, well developed, in no acute distress Heart: Atraumatic, normal size  Eyes: PEERLA, EOMI  Neck: Supple, no JVD Endocrine: No thryomegaly Cardiac:  Irregular rhythm, no murmurs rubs or gallops Lungs: Clear to auscultation bilaterally, no wheezing, rhonchi or rales  Abd: Soft, nontender, no hepatomegaly  Ext: Trace edema Musculoskeletal: No deformities, BUE and BLE strength normal and equal Skin: Warm and dry, no rashes   Neuro: Alert and oriented to person, place, time, and situation, CNII-XII grossly intact, no focal deficits  Psych: Normal mood and affect   ASSESSMENT:   [name] is a 86 y.o. female who presents for the following: 1. SOB (shortness of breath) on exertion   2. Permanent atrial fibrillation ([place])   3. Essential hypertension     PLAN:   1. SOB (shortness of breath) on exertion -She reports shortness of breath 1 year. No evidence of gross volume overload on examination. CT chest did demonstrate cardiomegaly and biatrial enlargement. We will check a BNP as well as an echocardiogram. She has no evidence of cardiovascular disease on my examination. Her rates are well controlled. This could be deconditioning or component of wearing a mask. She does report that her symptoms are worse with wearing a mask. Chest x-ray was obtained by her primary care physician office and was reported as clear. We will plan to see her back after the above work-up.  2. Permanent atrial fibrillation (HCC) -CHADSVASC=4 -rate controlled  -continue coreg 6.25 mg BID  3. Essential hypertension -Well-controlled today continue current medications.  Disposition: Return in about 1 month (around 02/20/2020).  Medication Adjustments/Labs and Tests Ordered: Current medicines are reviewed at length with the patient today.  Concerns regarding medicines are outlined above.  Orders Placed This Encounter  Procedures  . Brain natriuretic peptide  . ECHOCARDIOGRAM COMPLETE   No orders of the defined types were placed in this encounter.   Patient Instructions  Medication Instructions:  The current medical regimen is effective;  continue present  plan and medications.  *If you need a refill on your cardiac medications before your next  appointment, please call your pharmacy*   Lab Work: BNP today  If you have labs (blood work) drawn today and your tests are completely normal, you will receive your results only by: Marland Kitchen MyChart Message (if you have MyChart) OR . A paper copy in the mail If you have any lab test that is abnormal or we need to change your treatment, we will call you to review the results.   Testing/Procedures: Echocardiogram - Your physician has requested that you have an echocardiogram. Echocardiography is a painless test that uses sound waves to create images of your heart. It provides your doctor with information about the size and shape of your heart and how well your heart's chambers and valves are working. This procedure takes approximately one hour. There are no restrictions for this procedure. This will be performed at our [facility] location - [address].    Follow-Up: At [facility], you and your health needs are our priority.  As part of our continuing mission to provide you with exceptional heart care, we have created designated Provider Care Teams.  These Care Teams include your primary Cardiologist (physician) and Advanced Practice Providers (APPs -  Physician Assistants and Nurse Practitioners) who all work together to provide you with the care you need, when you need it.  We recommend signing up for the patient portal called "MyChart".  Sign up information is provided on this After Visit Summary.  MyChart is used to connect with patients for Virtual Visits (Telemedicine).  Patients are able to view lab/test results, encounter notes, upcoming appointments, etc.  Non-urgent messages can be sent to your provider as well.   To learn more about what you can do with MyChart, go to [email or web address].    Your next appointment:   1 month(s)  The format for your next appointment:   In  Person  Provider:   Eleonore Chiquito, MD      Signed, Addison Naegeli. Audie Box, [place]  [address] [place], [place] [address] [phone]  01/21/2020 4:59 PM

## 2020-01-21 ENCOUNTER — Ambulatory Visit (INDEPENDENT_AMBULATORY_CARE_PROVIDER_SITE_OTHER): Payer: Medicare Other | Admitting: Cardiovascular Disease

## 2020-01-21 ENCOUNTER — Encounter: Payer: Self-pay | Admitting: Cardiovascular Disease

## 2020-01-21 VITALS — BP 130/68 | HR 67 | Ht 58.5 in | Wt 137.0 lb

## 2020-01-21 DIAGNOSIS — I4821 Permanent atrial fibrillation: Secondary | ICD-10-CM

## 2020-01-21 DIAGNOSIS — R0602 Shortness of breath: Secondary | ICD-10-CM

## 2020-01-21 DIAGNOSIS — I10 Essential (primary) hypertension: Secondary | ICD-10-CM

## 2020-01-21 NOTE — Patient Instructions (Signed)
Medication Instructions:  The current medical regimen is effective;  continue present plan and medications.  *If you need a refill on your cardiac medications before your next appointment, please call your pharmacy*   Lab Work: BNP today  If you have labs (blood work) drawn today and your tests are completely normal, you will receive your results only by: Marland Kitchen MyChart Message (if you have MyChart) OR . A paper copy in the mail If you have any lab test that is abnormal or we need to change your treatment, we will call you to review the results.   Testing/Procedures: Echocardiogram - Your physician has requested that you have an echocardiogram. Echocardiography is a painless test that uses sound waves to create images of your heart. It provides your doctor with information about the size and shape of your heart and how well your heart's chambers and valves are working. This procedure takes approximately one hour. There are no restrictions for this procedure. This will be performed at our [facility] location - [address], [address].    Follow-Up: At [facility], you and your health needs are our priority.  As part of our continuing mission to provide you with exceptional heart care, we have created designated Provider Care Teams.  These Care Teams include your primary Cardiologist (physician) and Advanced Practice Providers (APPs -  Physician Assistants and Nurse Practitioners) who all work together to provide you with the care you need, when you need it.  We recommend signing up for the patient portal called "MyChart".  Sign up information is provided on this After Visit Summary.  MyChart is used to connect with patients for Virtual Visits (Telemedicine).  Patients are able to view lab/test results, encounter notes, upcoming appointments, etc.  Non-urgent messages can be sent to your provider as well.   To learn more about what you can do with MyChart, go to [email or web address].     Your next appointment:   1 month(s)  The format for your next appointment:   In Person  Provider:   Eleonore Chiquito, MD

## 2020-01-22 ENCOUNTER — Other Ambulatory Visit: Payer: Self-pay | Admitting: *Deleted

## 2020-01-22 ENCOUNTER — Telehealth: Payer: Self-pay | Admitting: Cardiovascular Disease

## 2020-01-22 DIAGNOSIS — R918 Other nonspecific abnormal finding of lung field: Secondary | ICD-10-CM

## 2020-01-22 LAB — BRAIN NATRIURETIC PEPTIDE: BNP: 173.4 pg/mL — ABNORMAL HIGH (ref 0.0–100.0)

## 2020-01-22 NOTE — Telephone Encounter (Signed)
Tried to call Mrs. Allard. No way to leave message. Elevated BNP. Needs to start lasix 20 mg daily.   [name] T. Audie Box, [place]  [address] [place], [place] [address] [phone]  4:15 PM

## 2020-01-25 MED ORDER — FUROSEMIDE 20 MG PO TABS: 20.0000 mg | ORAL_TABLET | Freq: Every day | ORAL | 1 refills | Status: DC

## 2020-01-25 NOTE — Telephone Encounter (Signed)
Thanks

## 2020-01-25 NOTE — Addendum Note (Signed)
Addended by: Caprice Beaver T on: 01/25/2020 01:55 PM   Modules accepted: Orders

## 2020-01-25 NOTE — Telephone Encounter (Signed)
Attempted to contact patient, LVM to call back to discuss results.  Left call back number.

## 2020-01-25 NOTE — Telephone Encounter (Signed)
Patient returned call-  Advised of results, sent in RX. Thanks!

## 2020-02-03 ENCOUNTER — Ambulatory Visit (HOSPITAL_COMMUNITY): Payer: Medicare Other | Attending: Cardiovascular Disease

## 2020-02-03 ENCOUNTER — Other Ambulatory Visit: Payer: Self-pay

## 2020-02-03 DIAGNOSIS — R0602 Shortness of breath: Secondary | ICD-10-CM | POA: Diagnosis present

## 2020-02-05 ENCOUNTER — Telehealth: Payer: Self-pay | Admitting: Cardiovascular Disease

## 2020-02-05 NOTE — Telephone Encounter (Signed)
Called Mrs. Kasperski. Has HFpEF. Improving with lasix. Will increase lasix to 40 mg daily for 3 days and then return to 20 mg daily. Will see her in early May. She is feeling better on lasix.   [name] T. Audie Box, [place]  [address] [place], [place] [address] [phone]  11:48 AM

## 2020-02-05 NOTE — Telephone Encounter (Signed)
  Patient has returned your call

## 2020-02-05 NOTE — Telephone Encounter (Signed)
Attempted to call [name]. She has heart failure with preserved ejection fraction. Need to see how she is doing on lasix. Left message as no answer.   [name] T. Audie Box, [place]  [address] [place], [place] [address] [phone]  10:59 AM

## 2020-02-21 NOTE — Progress Notes (Signed)
Cardiology Office Note:   Date:  02/22/2020  NAME:  [name]    MRN: [id number] DOB:  1934-08-12   PCP:  Marton Redwood, MD  Cardiologist:  Evalina Field, MD   Referring MD: Marton Redwood, MD   Chief Complaint  Patient presents with  . Congestive Heart Failure   History of Present Illness:   [name] is a 86 y.o. female with a hx of permanent Afib, HTN, cirrhosis, who presents for follow-up of HFpEF. Evaluated for SOB and had elevated BNP and dilated IVC. Started on lasix.  She reports she is noticed some decrease shortness of breath and starting Lasix.  She had an issue where she has gotten a little dizzy with it and she reports that she did drink some Gatorade with increase electrolytes.  I have advised against this this will increase her salt intake.  Heart rates have been well controlled.  There were issues with hyponatremia in the past on thiazide medications.  I have recommended a repeat BMP today.  She denies any chest pain or shortness of breath today.  She does have 1+ lower extremity edema.  She denies any chest pain with activity.  She reports that she does live in assisted living and able to walk to the dining room 2-3 times per day without any major limitations.  I encouraged her to increase her exercise for the time being.  She will follow with Dr. Brigitte Pulse in June.  Denies chest pain, shortness of breath, palpitations today.  Problem List 1. Permanent Afib -CHADSVASC=4 2. Carcinoid lung tumor  3. Cirrhosis  4. Hyponatremia 5. HTN 6. HFpEF  -EF 60-65% -SOB, BNP 173  Past Medical History: Past Medical History:  Diagnosis Date  . Arthritis 1996  . Atrial fibrillation (HCC)    on coumadin  . Atrial tachycardia ([place])   . Cancer [facility] 1988   breast cancer, R mastectomy, chemo  . 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 Diverticulitis 1999  . Femur fracture ([place]) 03/2013   left, non weight bearing for 2 1/2 weeks  .  GERD (gastroesophageal reflux disease)   . Hemorrhoids 1991  . HTN (hypertension)   . Lymphedema of upper extremity    right arm  . Osteoporosis   . PONV (postoperative nausea and vomiting)   . Urinary, incontinence, stress female   . Vitamin D deficiency disease     Past Surgical History: Past Surgical History:  Procedure Laterality Date  . APPENDECTOMY    . BREAST BIOPSY  05/04/1987   Right Dr Annamaria Boots  . BREAST IMPLANT REMOVAL Right 06/05/10  . BUNIONECTOMY  1988  . carcinoid Right    Right lower lobectomy 2017  . COLONOSCOPY W/ POLYPECTOMY    . EYE SURGERY  5/14 ; 6/14   Cataract Surgery  . JOINT REPLACEMENT     2002 left; 2009 right Hip, rt knee 2012  . KNEE ARTHROPLASTY Right 2012   [facility]le  . LOBECTOMY Right 01/05/2016   Procedure: RIGHT LOWER LOBE LOBECTOMY;  Surgeon: Melrose Nakayama, MD;  Location: [place];  Service: Thoracic;  Laterality: Right;  . MASTECTOMY PARTIAL / LUMPECTOMY W/ AXILLARY LYMPHADENECTOMY  04/05/1987   Right - Dr Annamaria Boots  . RESECTION OF MEDIASTINAL MASS N/A 01/05/2016   Procedure: RESECTION OF PLEURAL MASS, right;  Surgeon: Melrose Nakayama, MD;  Location: [place];  Service: Thoracic;  Laterality: N/A;  . TONSILLECTOMY    .  TOTAL HIP ARTHROPLASTY    . TUBAL LIGATION Bilateral 1970  . VAGINAL HYSTERECTOMY  1977   my[place]ata  . VIDEO ASSISTED THORACOSCOPY (VATS)/WEDGE RESECTION Right 01/05/2016   Procedure: RIGHT VIDEO ASSISTED THORACOSCOPY (VATS)/WEDGE RESECTION;  Surgeon: Melrose Nakayama, MD;  Location: [place];  Service: Thoracic;  Laterality: Right;    Current Medications: Current Meds  Medication Sig  . acetaminophen (TYLENOL) 500 MG tablet Take 2 tablets (1,000 mg total) by mouth every 6 (six) hours as needed for mild pain.  Marland Kitchen amLODipine (NORVASC) 10 MG tablet Take 10 mg by mouth daily.  Marland Kitchen apixaban (ELIQUIS) 5 MG TABS tablet Take 5 mg by mouth 2 (two) times daily.  . Calcium Carbonate-Vitamin D (CALTRATE 600+D) 600-400 MG-UNIT per tablet  Take 1 tablet by mouth daily.  . carboxymethylcellulose (REFRESH PLUS) 0.5 % SOLN Place 1 drop into both eyes 2 (two) times daily as needed (for dry eyes).  . carvedilol (COREG) 6.25 MG tablet Take 1 tablet (6.25 mg total) by mouth 2 (two) times daily with a meal.  . Ergocalciferol (VITAMIN D2) 400 units TABS Take 400 Units by mouth daily.  . Estradiol (VAGIFEM) 10 MCG TABS vaginal tablet Place 1 tablet (10 mcg total) vaginally 2 (two) times a week.  . furosemide (LASIX) 20 MG tablet Take 1 tablet (20 mg total) by mouth daily.  . Multiple Vitamin (MULTIVITAMIN WITH MINERALS) TABS Take 1 tablet by mouth every evening.  . Omega-3 Fatty Acids (FISH OIL) 1000 MG CAPS Take 1 capsule by mouth 2 (two) times daily.  . rosuvastatin (CRESTOR) 10 MG tablet Take 10 mg by mouth at bedtime.  Marland Kitchen telmisartan (MICARDIS) 80 MG tablet Take 80 mg by mouth daily.  Marland Kitchen UNABLE TO FIND Macular supplement  . [DISCONTINUED] furosemide (LASIX) 20 MG tablet Take 1 tablet (20 mg total) by mouth daily.     Allergies:    Aspirin   Social History: Social History   Socioeconomic History  . Marital status: Widowed    Spouse name: Not on file  . Number of children: Not on file  . Years of education: Not on file  . Highest education level: Not on file  Occupational History  . Not on file  Tobacco Use  . Smoking status: Never Smoker  . Smokeless tobacco: Never Used  Substance and Sexual Activity  . Alcohol use: No    Alcohol/week: 0.0 standard drinks  . Drug use: No  . Sexual activity: Not Currently    Partners: Male    Birth control/protection: Surgical    Comment: hysterectomy  Other Topics Concern  . Not on file  Social History Narrative  . Not on file   Social Determinants of Health   Financial Resource Strain:   . Difficulty of Paying Living Expenses:   Food Insecurity:   . Worried About Charity fundraiser in the Last Year:   . Arboriculturist in the Last Year:   Transportation Needs:   . Lexicographer (Medical):   Marland Kitchen Lack of Transportation (Non-Medical):   Physical Activity:   . Days of Exercise per Week:   . Minutes of Exercise per Session:   Stress:   . Feeling of Stress :   Social Connections:   . Frequency of Communication with Friends and Family:   . Frequency of Social Gatherings with Friends and Family:   . Attends Religious Services:   . Active Member of Clubs or Organizations:   . Attends Club or  Organization Meetings:   Marland Kitchen Marital Status:      Family History: The patient's family history includes Colon cancer in her father and mother; Heart disease in her father.  ROS:   All other ROS reviewed and negative. Pertinent positives noted in the HPI.     EKGs/Labs/Other Studies Reviewed:   The following studies were personally reviewed by me today:  TTE 02/03/2020 1. Left ventricular ejection fraction, by estimation, is 60 to 65%. The  left ventricle has normal function. The left ventricle has no regional  wall motion abnormalities. Left ventricular diastolic function could not  be evaluated.  2. Right ventricular systolic function is normal. The right ventricular  size is normal. There is severely elevated pulmonary artery systolic  pressure. The estimated right ventricular systolic pressure is 53.9 mmHg.  3. Left atrial size was moderately dilated.  4. Right atrial size was severely dilated.  5. The mitral valve is degenerative. Mild mitral valve regurgitation.  6. Tricuspid valve regurgitation is moderate.  7. The aortic valve is tricuspid. Aortic valve regurgitation is trivial.  Mild aortic valve sclerosis is present, with no evidence of aortic valve  stenosis.  8. The inferior vena cava is dilated in size with <50% respiratory  variability, suggesting right atrial pressure of 15 mmHg.    Recent Labs: 01/21/2020: BNP 173.4   Recent Lipid Panel    Component Value Date/Time   CHOL 201 (HH) 04/09/2007 0915   TRIG 50 04/09/2007 0915   HDL  65.9 04/09/2007 0915   CHOLHDL 3.1 CALC 04/09/2007 0915   VLDL 10 04/09/2007 0915   LDLDIRECT 107.8 04/09/2007 0915    Physical Exam:   VS:  BP 116/70   Pulse 76   Ht 4\' 11"  (1.499 m)   Wt 137 lb 12.8 oz (62.5 kg)   LMP 10/16/1975 (Approximate)   SpO2 92%   BMI 27.83 kg/m    Wt Readings from Last 3 Encounters:  02/22/20 137 lb 12.8 oz (62.5 kg)  01/21/20 137 lb (62.1 kg)  07/22/19 133 lb (60.3 kg)    General: Well nourished, well developed, in no acute distress Heart: Atraumatic, normal size  Eyes: PEERLA, EOMI  Neck: Supple, no JVD Endocrine: No thryomegaly Cardiac: Normal S1, S2; RRR; no murmurs, rubs, or gallops Lungs: Clear to auscultation bilaterally, no wheezing, rhonchi or rales  Abd: Soft, nontender, no hepatomegaly  Ext: No edema, pulses 2+ Musculoskeletal: No deformities, BUE and BLE strength normal and equal Skin: Warm and dry, no rashes   Neuro: Alert and oriented to person, place, time, and situation, CNII-XII grossly intact, no focal deficits  Psych: Normal mood and affect   ASSESSMENT:   [name] is a 86 y.o. female who presents for the following: 1. Chronic diastolic heart failure (HCC)   2. Pulmonary hypertension, unspecified (HCC)   3. Permanent atrial fibrillation ([place])   4. Essential hypertension     PLAN:   1. Chronic diastolic heart failure (HCC) 2. Pulmonary hypertension, unspecified ([place]) -She was seen a few weeks ago with shortness of breath and lower extreme edema.  BNP elevated at 170.  Echocardiogram in A. fib but noted to have moderately elevated pulmonary hypertension.  Overall she has heart failure with preserved ejection fraction.  I have recommended Lasix 20 mg daily.  I recommended for her watch her salt intake.  Her main symptom is shortness of breath.  She denies any chest pain or pressure.  I think the best treatment will be diuretic therapy  as well as blood pressure control.  BP is well controlled today.  We will plan to recheck  a BMP today just to make sure potassium is not low.  We will plan to see her back in 6 months.  We will send our note today to her primary care physician.  3. Permanent atrial fibrillation (HCC) --CHADSVASC=4 -Has had atrial fibrillation for a long time.  Severely dilated atria.  We will continue with Eliquis and rate control.  She is on carvedilol 6.25 mg twice daily.  4. Essential hypertension -Well-controlled today.  Disposition: Return in about 6 months (around 08/24/2020).  Medication Adjustments/Labs and Tests Ordered: Current medicines are reviewed at length with the patient today.  Concerns regarding medicines are outlined above.  Orders Placed This Encounter  Procedures  . Basic metabolic panel   Meds ordered this encounter  Medications  . furosemide (LASIX) 20 MG tablet    Sig: Take 1 tablet (20 mg total) by mouth daily.    Dispense:  90 tablet    Refill:  1    Patient Instructions  Medication Instructions:  The current medical regimen is effective;  continue present plan and medications.  *If you need a refill on your cardiac medications before your next appointment, please call your pharmacy*   Lab Work: BMET today   If you have labs (blood work) drawn today and your tests are completely normal, you will receive your results only by: Marland Kitchen MyChart Message (if you have MyChart) OR . A paper copy in the mail If you have any lab test that is abnormal or we need to change your treatment, we will call you to review the results.    Follow-Up: At [facility], you and your health needs are our priority.  As part of our continuing mission to provide you with exceptional heart care, we have created designated Provider Care Teams.  These Care Teams include your primary Cardiologist (physician) and Advanced Practice Providers (APPs -  Physician Assistants and Nurse Practitioners) who all work together to provide you with the care you need, when you need it.  We recommend  signing up for the patient portal called "MyChart".  Sign up information is provided on this After Visit Summary.  MyChart is used to connect with patients for Virtual Visits (Telemedicine).  Patients are able to view lab/test results, encounter notes, upcoming appointments, etc.  Non-urgent messages can be sent to your provider as well.   To learn more about what you can do with MyChart, go to [email or web address].    Your next appointment:   6 month(s)  The format for your next appointment:   In Person  Provider:   Eleonore Chiquito, MD        Time Spent with Patient: I have spent a total of 35 minutes with patient reviewing hospital notes, telemetry, EKGs, labs and examining the patient as well as establishing an assessment and plan that was discussed with the patient.  > 50% of time was spent in direct patient care.  Signed, Addison Naegeli. Audie Box, [place]  [address] [place],  [address] [phone]  02/22/2020 2:49 PM

## 2020-02-22 ENCOUNTER — Other Ambulatory Visit: Payer: Self-pay

## 2020-02-22 ENCOUNTER — Encounter: Payer: Self-pay | Admitting: Cardiovascular Disease

## 2020-02-22 ENCOUNTER — Ambulatory Visit (INDEPENDENT_AMBULATORY_CARE_PROVIDER_SITE_OTHER): Payer: Medicare Other | Admitting: Cardiovascular Disease

## 2020-02-22 VITALS — BP 116/70 | HR 76 | Ht 59.0 in | Wt 137.8 lb

## 2020-02-22 DIAGNOSIS — I4821 Permanent atrial fibrillation: Secondary | ICD-10-CM | POA: Diagnosis not present

## 2020-02-22 DIAGNOSIS — I5032 Chronic diastolic (congestive) heart failure: Secondary | ICD-10-CM

## 2020-02-22 DIAGNOSIS — I10 Essential (primary) hypertension: Secondary | ICD-10-CM

## 2020-02-22 DIAGNOSIS — I272 Pulmonary hypertension, unspecified: Secondary | ICD-10-CM

## 2020-02-22 MED ORDER — FUROSEMIDE 20 MG PO TABS: 20.0000 mg | ORAL_TABLET | Freq: Every day | ORAL | 1 refills | Status: DC

## 2020-02-22 NOTE — Patient Instructions (Signed)
Medication Instructions:  The current medical regimen is effective;  continue present plan and medications.  *If you need a refill on your cardiac medications before your next appointment, please call your pharmacy*   Lab Work: BMET today   If you have labs (blood work) drawn today and your tests are completely normal, you will receive your results only by: Marland Kitchen MyChart Message (if you have MyChart) OR . A paper copy in the mail If you have any lab test that is abnormal or we need to change your treatment, we will call you to review the results.    Follow-Up: At [facility], you and your health needs are our priority.  As part of our continuing mission to provide you with exceptional heart care, we have created designated Provider Care Teams.  These Care Teams include your primary Cardiologist (physician) and Advanced Practice Providers (APPs -  Physician Assistants and Nurse Practitioners) who all work together to provide you with the care you need, when you need it.  We recommend signing up for the patient portal called "MyChart".  Sign up information is provided on this After Visit Summary.  MyChart is used to connect with patients for Virtual Visits (Telemedicine).  Patients are able to view lab/test results, encounter notes, upcoming appointments, etc.  Non-urgent messages can be sent to your provider as well.   To learn more about what you can do with MyChart, go to [email or web address].    Your next appointment:   6 month(s)  The format for your next appointment:   In Person  Provider:   Eleonore Chiquito, MD

## 2020-02-23 LAB — BASIC METABOLIC PANEL
BUN/Creatinine Ratio: 15 (ref 12–28)
BUN: 13 mg/dL (ref 8–27)
CO2: 24 mmol/L (ref 20–29)
Calcium: 9.9 mg/dL (ref 8.7–10.3)
Chloride: 93 mmol/L — ABNORMAL LOW (ref 96–106)
Creatinine, Ser: 0.86 mg/dL (ref 0.57–1.00)
GFR calc Af Amer: 72 mL/min/{1.73_m2} (ref >59)
GFR calc non Af Amer: 63 mL/min/{1.73_m2} (ref >59)
Glucose: 100 mg/dL — ABNORMAL HIGH (ref 65–99)
Potassium: 4.9 mmol/L (ref 3.5–5.2)
Sodium: 133 mmol/L — ABNORMAL LOW (ref 134–144)

## 2020-03-03 ENCOUNTER — Ambulatory Visit
Admission: RE | Admit: 2020-03-03 | Discharge: 2020-03-03 | Disposition: A | Discharge status: Home / Self Care | Payer: Medicare Other | Source: Ambulatory Visit | Attending: Thoracic Surgery (Cardiothoracic Vascular Surgery) | Admitting: Thoracic Surgery (Cardiothoracic Vascular Surgery)

## 2020-03-03 DIAGNOSIS — R918 Other nonspecific abnormal finding of lung field: Secondary | ICD-10-CM

## 2020-03-08 ENCOUNTER — Ambulatory Visit (INDEPENDENT_AMBULATORY_CARE_PROVIDER_SITE_OTHER): Payer: Medicare Other | Admitting: Thoracic Surgery (Cardiothoracic Vascular Surgery)

## 2020-03-08 ENCOUNTER — Encounter: Payer: Self-pay | Admitting: Thoracic Surgery (Cardiothoracic Vascular Surgery)

## 2020-03-08 ENCOUNTER — Other Ambulatory Visit: Payer: Self-pay

## 2020-03-08 VITALS — BP 133/74 | HR 57 | Resp 20 | Ht 59.0 in | Wt 133.0 lb

## 2020-03-08 DIAGNOSIS — Z902 Acquired absence of lung [part of]: Secondary | ICD-10-CM | POA: Diagnosis not present

## 2020-03-08 DIAGNOSIS — R918 Other nonspecific abnormal finding of lung field: Secondary | ICD-10-CM | POA: Diagnosis not present

## 2020-03-08 DIAGNOSIS — C7A090 Malignant carcinoid tumor of the bronchus and lung: Secondary | ICD-10-CM

## 2020-03-08 NOTE — Progress Notes (Signed)
[address]       [place] [address]             [phone]       HPI: 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  Over the years we have been following her she has had some waxing and waning pulmonary nodules.  I last saw her in the office a year ago.  She was doing well at that time with no suspicious nodules on CT.  She recently saw Dr. Davina Poke of cardiology for chronic diastolic heart failure.  Her symptoms have improved significantly with medical management.  She is not having any chest pain, pressure, tightness.  Her shortness of breath has improved.  Past Medical History:  Diagnosis Date  . Arthritis 1996  . Atrial fibrillation (HCC)    on coumadin  . Atrial tachycardia ([place])   . Cancer [facility]) 1988   breast cancer, R mastectomy, chemo  . 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 Diverticulitis 1999  . Femur fracture ([place]) 03/2013   left, non weight bearing for 2 1/2 weeks  . GERD (gastroesophageal reflux disease)   . Hemorrhoids 1991  . HTN (hypertension)   . Lymphedema of upper extremity    right arm  . Osteoporosis   . PONV (postoperative nausea and vomiting)   . Urinary, incontinence, stress female   . Vitamin D deficiency disease     Current Outpatient Medications  Medication Sig Dispense Refill  . acetaminophen (TYLENOL) 500 MG tablet Take 2 tablets (1,000 mg total) by mouth every 6 (six) hours as needed for mild pain. 30 tablet 0  . amLODipine (NORVASC) 10 MG tablet Take 10 mg by mouth daily.    Marland Kitchen apixaban (ELIQUIS) 5 MG TABS tablet Take 5 mg by mouth 2 (two) times daily.    . Calcium Carbonate-Vitamin D (CALTRATE 600+D)  600-400 MG-UNIT per tablet Take 1 tablet by mouth daily.    . carboxymethylcellulose (REFRESH PLUS) 0.5 % SOLN Place 1 drop into both eyes 2 (two) times daily as needed (for dry eyes).    . carvedilol (COREG) 6.25 MG tablet Take 1 tablet (6.25 mg total) by mouth 2 (two) times daily with a meal. 60 tablet 1  . Ergocalciferol (VITAMIN D2) 400 units TABS Take 400 Units by mouth daily.    . Estradiol (VAGIFEM) 10 MCG TABS vaginal tablet Place 1 tablet (10 mcg total) vaginally 2 (two) times a week. 24 tablet 3  . furosemide (LASIX) 20 MG tablet Take 1 tablet (20 mg total) by mouth daily. 90 tablet 1  . Multiple Vitamin (MULTIVITAMIN WITH MINERALS) TABS Take 1 tablet by mouth every evening.    . Omega-3 Fatty Acids (FISH OIL) 1000 MG CAPS Take 1 capsule by mouth 2 (two) times daily.    . rosuvastatin (CRESTOR) 10 MG tablet Take 10 mg by mouth at bedtime.    Marland Kitchen telmisartan (MICARDIS) 80 MG tablet Take 80 mg by mouth daily.    Marland Kitchen UNABLE TO FIND Macular supplement     No current facility-administered medications for this visit.    Physical Exam BP 133/74   Pulse (!) 57   Resp 20   Ht  4\' 11"  (1.499 m)   Wt 133 lb (60.3 kg)   LMP 10/16/1975 (Approximate)   SpO2 94% Comment: RA  BMI 26.102 kg/m  86 year old woman in no acute distress Alert and oriented x3 with no focal deficits No cervical supraclavicular adenopathy Cardiac irregularly irregular Lungs diminished at right base, otherwise clear  Diagnostic Tests: CT CHEST WITHOUT CONTRAST  TECHNIQUE: Multidetector CT imaging of the chest was performed following the standard protocol without IV contrast.  COMPARISON:  CT scan 03/03/2019  FINDINGS: Cardiovascular: The heart is upper limits of normal in size and stable. No pericardial effusion. Stable tortuosity and calcification of the thoracic aorta and stable extensive three-vessel coronary artery calcifications.  Mediastinum/Nodes: No enlarged mediastinal or hilar lymph  nodes. Stable small scattered lymph nodes. The esophagus is grossly normal.  Lungs/Pleura: Stable surgical changes from a right lower lobe lobectomy. Chronic loculated right pleural fluid collections.  Stable mosaic pattern of lung attenuation most likely due to air trapping its no infiltrates or worrisome pulmonary lesions. Stable 4.5 mm right upper lobe pulmonary nodule on image number 38.  Other small sub 4 mm scattered pulmonary nodules are stable. Stable area of branching opacity in the right lower lung most consistent with chronic mucoid impaction. No masses identified.  Small scattered sub 3 mm nodules in the left lower lobe along with a small calcified granuloma.  Upper Abdomen: No significant upper abdominal findings. Stable advanced vascular calcifications. Stable right hepatic lobe cyst.  Musculoskeletal: No breast masses are identified. Surgical changes from right mastectomy. No supraclavicular or axillary adenopathy. Small scattered thyroid nodules are stable.  Stable degenerative changes involving the thoracic spine. Stable advanced degenerative changes involving both shoulders.  IMPRESSION: 1. Stable surgical changes from a right lower lobe lobectomy. 2. Chronic loculated right pleural fluid collections. 3. Stable mosaic pattern of lung attenuation most likely due to air trapping. 4. Stable small scattered pulmonary nodules. No new or worrisome pulmonary lesions. 5. No mediastinal or hilar mass or adenopathy. 6. Stable advanced atherosclerotic calcifications involving the aorta and coronary arteries. 7. Aortic atherosclerosis.  Aortic Atherosclerosis (ICD10-I70.0).  Aortic Atherosclerosis (ICD10-I70.0).   Electronically Signed   By: Marijo Sanes M.D.   On: 03/03/2020 11:02 I personally reviewed the CT images and concur with the findings noted above  Impression: [name] is an 86 year old woman who had a right lower lobectomy for a  stage Ia carcinoid tumor 4 years ago.  She is doing well with no evidence of recurrent disease.  We will plan to do another CT in 1 year.  Thoracic aortic atherosclerosis-on Crestor.  Persistent atrial fibrillation-on Coreg and Eliquis, managed by cardiology  Hypertension-blood pressure well controlled on current regimen  Plan: Return in 1 year with CT chest for 5-year follow-up  Melrose Nakayama, MD [facility] [phone]

## 2020-05-30 ENCOUNTER — Emergency Department (HOSPITAL_BASED_OUTPATIENT_CLINIC_OR_DEPARTMENT_OTHER): Payer: Medicare Other

## 2020-05-30 ENCOUNTER — Other Ambulatory Visit: Payer: Self-pay

## 2020-05-30 ENCOUNTER — Inpatient Hospital Stay (HOSPITAL_BASED_OUTPATIENT_CLINIC_OR_DEPARTMENT_OTHER)
Admission: EM | Admit: 2020-05-30 | Discharge: 2020-06-08 | DRG: 292 | Disposition: A | Discharge status: Skilled Nursing Facility | Payer: Medicare Other | Attending: Internal Medicine | Admitting: Internal Medicine

## 2020-05-30 ENCOUNTER — Encounter (HOSPITAL_BASED_OUTPATIENT_CLINIC_OR_DEPARTMENT_OTHER): Payer: Self-pay

## 2020-05-30 DIAGNOSIS — N390 Urinary tract infection, site not specified: Secondary | ICD-10-CM | POA: Diagnosis present

## 2020-05-30 DIAGNOSIS — E875 Hyperkalemia: Secondary | ICD-10-CM | POA: Diagnosis not present

## 2020-05-30 DIAGNOSIS — Z66 Do not resuscitate: Secondary | ICD-10-CM | POA: Diagnosis present

## 2020-05-30 DIAGNOSIS — Z800 Family history of malignant neoplasm of digestive organs: Secondary | ICD-10-CM

## 2020-05-30 DIAGNOSIS — J9601 Acute respiratory failure with hypoxia: Secondary | ICD-10-CM

## 2020-05-30 DIAGNOSIS — I110 Hypertensive heart disease with heart failure: Principal | ICD-10-CM | POA: Diagnosis present

## 2020-05-30 DIAGNOSIS — R739 Hyperglycemia, unspecified: Secondary | ICD-10-CM | POA: Diagnosis present

## 2020-05-30 DIAGNOSIS — Z79899 Other long term (current) drug therapy: Secondary | ICD-10-CM

## 2020-05-30 DIAGNOSIS — Z9011 Acquired absence of right breast and nipple: Secondary | ICD-10-CM

## 2020-05-30 DIAGNOSIS — B9620 Unspecified Escherichia coli [E. coli] as the cause of diseases classified elsewhere: Secondary | ICD-10-CM | POA: Diagnosis present

## 2020-05-30 DIAGNOSIS — Z9221 Personal history of antineoplastic chemotherapy: Secondary | ICD-10-CM

## 2020-05-30 DIAGNOSIS — E871 Hypo-osmolality and hyponatremia: Secondary | ICD-10-CM | POA: Diagnosis present

## 2020-05-30 DIAGNOSIS — Z90710 Acquired absence of both cervix and uterus: Secondary | ICD-10-CM

## 2020-05-30 DIAGNOSIS — I959 Hypotension, unspecified: Secondary | ICD-10-CM | POA: Diagnosis not present

## 2020-05-30 DIAGNOSIS — B952 Enterococcus as the cause of diseases classified elsewhere: Secondary | ICD-10-CM | POA: Diagnosis present

## 2020-05-30 DIAGNOSIS — Z96641 Presence of right artificial hip joint: Secondary | ICD-10-CM | POA: Diagnosis present

## 2020-05-30 DIAGNOSIS — Z20822 Contact with and (suspected) exposure to covid-19: Secondary | ICD-10-CM | POA: Diagnosis present

## 2020-05-30 DIAGNOSIS — R110 Nausea: Secondary | ICD-10-CM | POA: Diagnosis present

## 2020-05-30 DIAGNOSIS — I5033 Acute on chronic diastolic (congestive) heart failure: Secondary | ICD-10-CM

## 2020-05-30 DIAGNOSIS — Z902 Acquired absence of lung [part of]: Secondary | ICD-10-CM

## 2020-05-30 DIAGNOSIS — Z8249 Family history of ischemic heart disease and other diseases of the circulatory system: Secondary | ICD-10-CM

## 2020-05-30 DIAGNOSIS — S7002XA Contusion of left hip, initial encounter: Secondary | ICD-10-CM | POA: Diagnosis present

## 2020-05-30 DIAGNOSIS — I4510 Unspecified right bundle-branch block: Secondary | ICD-10-CM | POA: Diagnosis present

## 2020-05-30 DIAGNOSIS — Z7901 Long term (current) use of anticoagulants: Secondary | ICD-10-CM

## 2020-05-30 DIAGNOSIS — R001 Bradycardia, unspecified: Secondary | ICD-10-CM | POA: Diagnosis not present

## 2020-05-30 DIAGNOSIS — K5900 Constipation, unspecified: Secondary | ICD-10-CM | POA: Diagnosis not present

## 2020-05-30 DIAGNOSIS — I4821 Permanent atrial fibrillation: Secondary | ICD-10-CM | POA: Diagnosis present

## 2020-05-30 DIAGNOSIS — Z886 Allergy status to analgesic agent status: Secondary | ICD-10-CM

## 2020-05-30 DIAGNOSIS — Z96651 Presence of right artificial knee joint: Secondary | ICD-10-CM | POA: Diagnosis present

## 2020-05-30 DIAGNOSIS — I509 Heart failure, unspecified: Secondary | ICD-10-CM | POA: Diagnosis not present

## 2020-05-30 DIAGNOSIS — Z85110 Personal history of malignant carcinoid tumor of bronchus and lung: Secondary | ICD-10-CM

## 2020-05-30 DIAGNOSIS — Z853 Personal history of malignant neoplasm of breast: Secondary | ICD-10-CM

## 2020-05-30 DIAGNOSIS — I2721 Secondary pulmonary arterial hypertension: Secondary | ICD-10-CM | POA: Diagnosis present

## 2020-05-30 DIAGNOSIS — D649 Anemia, unspecified: Secondary | ICD-10-CM | POA: Diagnosis present

## 2020-05-30 DIAGNOSIS — M810 Age-related osteoporosis without current pathological fracture: Secondary | ICD-10-CM | POA: Diagnosis present

## 2020-05-30 DIAGNOSIS — B961 Klebsiella pneumoniae [K. pneumoniae] as the cause of diseases classified elsewhere: Secondary | ICD-10-CM | POA: Diagnosis present

## 2020-05-30 DIAGNOSIS — R7989 Other specified abnormal findings of blood chemistry: Secondary | ICD-10-CM | POA: Diagnosis not present

## 2020-05-30 DIAGNOSIS — K219 Gastro-esophageal reflux disease without esophagitis: Secondary | ICD-10-CM | POA: Diagnosis present

## 2020-05-30 DIAGNOSIS — W19XXXA Unspecified fall, initial encounter: Secondary | ICD-10-CM

## 2020-05-30 LAB — CBC WITH DIFFERENTIAL/PLATELET
Abs Immature Granulocytes: 0.03 10*3/uL (ref 0.00–0.07)
Basophils Absolute: 0 10*3/uL (ref 0.0–0.1)
Basophils Relative: 0 %
Eosinophils Absolute: 0 10*3/uL (ref 0.0–0.5)
Eosinophils Relative: 0 %
HCT: 34.3 % — ABNORMAL LOW (ref 36.0–46.0)
Hemoglobin: 11.3 g/dL — ABNORMAL LOW (ref 12.0–15.0)
Immature Granulocytes: 0 %
Lymphocytes Relative: 4 %
Lymphs Abs: 0.3 10*3/uL — ABNORMAL LOW (ref 0.7–4.0)
MCH: 30.9 pg (ref 26.0–34.0)
MCHC: 32.9 g/dL (ref 30.0–36.0)
MCV: 93.7 fL (ref 80.0–100.0)
Monocytes Absolute: 0.5 10*3/uL (ref 0.1–1.0)
Monocytes Relative: 7 %
Neutro Abs: 6.5 10*3/uL (ref 1.7–7.7)
Neutrophils Relative %: 89 %
Platelets: 196 10*3/uL (ref 150–400)
RBC: 3.66 MIL/uL — ABNORMAL LOW (ref 3.87–5.11)
RDW: 14.1 % (ref 11.5–15.5)
WBC: 7.4 10*3/uL (ref 4.0–10.5)
nRBC: 0 % (ref 0.0–0.2)

## 2020-05-30 LAB — BASIC METABOLIC PANEL
Anion gap: 12 (ref 5–15)
BUN: 9 mg/dL (ref 8–23)
CO2: 24 mmol/L (ref 22–32)
Calcium: 9.2 mg/dL (ref 8.9–10.3)
Chloride: 82 mmol/L — ABNORMAL LOW (ref 98–111)
Creatinine, Ser: 0.51 mg/dL (ref 0.44–1.00)
GFR calc Af Amer: >60 mL/min (ref >60)
GFR calc non Af Amer: >60 mL/min (ref >60)
Glucose, Bld: 126 mg/dL — ABNORMAL HIGH (ref 70–99)
Potassium: 3.8 mmol/L (ref 3.5–5.1)
Sodium: 118 mmol/L — CL (ref 135–145)

## 2020-05-30 LAB — BRAIN NATRIURETIC PEPTIDE: B Natriuretic Peptide: 402.7 pg/mL — ABNORMAL HIGH (ref 0.0–100.0)

## 2020-05-30 LAB — SARS CORONAVIRUS 2 BY RT PCR (HOSPITAL ORDER, PERFORMED IN ~~LOC~~ HOSPITAL LAB): SARS Coronavirus 2: NEGATIVE

## 2020-05-30 LAB — TROPONIN I (HIGH SENSITIVITY)
Troponin I (High Sensitivity): 7 ng/L (ref <18)
Troponin I (High Sensitivity): 7 ng/L (ref <18)

## 2020-05-30 MED ORDER — ALBUTEROL SULFATE (2.5 MG/3ML) 0.083% IN NEBU: 5.0000 mg | INHALATION_SOLUTION | Freq: Once | RESPIRATORY_TRACT | Status: DC

## 2020-05-30 MED ORDER — FENTANYL CITRATE (PF) 100 MCG/2ML IJ SOLN
50.0000 ug | Freq: Once | INTRAMUSCULAR | Status: AC
  Administered 2020-05-30: 50 ug via INTRAVENOUS
  Filled 2020-05-30: qty 2

## 2020-05-30 MED ORDER — ONDANSETRON HCL 4 MG/2ML IJ SOLN
4.0000 mg | Freq: Once | INTRAMUSCULAR | Status: AC
  Administered 2020-05-30: 4 mg via INTRAVENOUS
  Filled 2020-05-30: qty 2

## 2020-05-30 MED ORDER — FUROSEMIDE 10 MG/ML IJ SOLN
40.0000 mg | Freq: Once | INTRAMUSCULAR | Status: AC
  Administered 2020-05-30: 40 mg via INTRAVENOUS
  Filled 2020-05-30: qty 4

## 2020-05-30 NOTE — ED Notes (Signed)
Unable obtain blood for labs in triage.  VP stickx1 left forearm.

## 2020-05-30 NOTE — ED Provider Notes (Signed)
[facility] Emergency Department Provider Note MRN:  [id number]  Arrival date & time: 05/30/20     Chief Complaint   Weakness   History of Present Illness   [name] is a 86 y.o. year-old female with a history of A. fib, CHF presenting to the ED with chief complaint of weakness.  2 weeks of progressively worsening weakness, poor appetite, nausea, intermittent cough.  Lower extremity edema is slightly worse.  Dyspnea with exertion.  Denies chest pain.  No fever.  No abdominal pain, no vomiting or diarrhea.  Review of Systems  A complete 10 system review of systems was obtained and all systems are negative except as noted in the HPI and PMH.   Patient's Health History    Past Medical History:  Diagnosis Date  . Arthritis 1996  . Atrial fibrillation (HCC)    on coumadin  . Atrial tachycardia ([place])   . Cancer [facility]) 1988   breast cancer, R mastectomy, chemo  . 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 Diverticulitis 1999  . Femur fracture ([place]) 03/2013   left, non weight bearing for 2 1/2 weeks  . GERD (gastroesophageal reflux disease)   . Hemorrhoids 1991  . HTN (hypertension)   . Lymphedema of upper extremity    right arm  . Osteoporosis   . PONV (postoperative nausea and vomiting)   . Urinary, incontinence, stress female   . Vitamin D deficiency disease     Past Surgical History:  Procedure Laterality Date  . APPENDECTOMY    . BREAST BIOPSY  05/04/1987   Right Dr Annamaria Boots  . BREAST IMPLANT REMOVAL Right 06/05/10  . BUNIONECTOMY  1988  . carcinoid Right    Right lower lobectomy 2017  . COLONOSCOPY W/ POLYPECTOMY    . EYE SURGERY  5/14 ; 6/14   Cataract Surgery  . JOINT REPLACEMENT     2002 left; 2009 right Hip, rt knee 2012  . KNEE ARTHROPLASTY Right 2012   Elvina Sidle  . LOBECTOMY Right 01/05/2016   Procedure: RIGHT LOWER LOBE LOBECTOMY;  Surgeon: Melrose Nakayama, MD;  Location: [place];  Service: Thoracic;  Laterality: Right;  . MASTECTOMY PARTIAL / LUMPECTOMY W/ AXILLARY LYMPHADENECTOMY  04/05/1987   Right - Dr Annamaria Boots  . RESECTION OF MEDIASTINAL MASS N/A 01/05/2016   Procedure: RESECTION OF PLEURAL MASS, right;  Surgeon: Melrose Nakayama, MD;  Location: [place];  Service: Thoracic;  Laterality: N/A;  . TONSILLECTOMY    . TOTAL HIP ARTHROPLASTY    . TUBAL LIGATION Bilateral 1970  . VAGINAL HYSTERECTOMY  1977   myomata  . VIDEO ASSISTED THORACOSCOPY (VATS)/WEDGE RESECTION Right 01/05/2016   Procedure: RIGHT VIDEO ASSISTED THORACOSCOPY (VATS)/WEDGE RESECTION;  Surgeon: Melrose Nakayama, MD;  Location: [place];  Service: Thoracic;  Laterality: Right;    Family History  Problem Relation Age of Onset  . Colon cancer Mother   . Colon cancer Father   . Heart disease Father     Social History   Socioeconomic History  . Marital status: Widowed    Spouse name: Not on file  . Number of children: Not on file  . Years of education: Not on file  . Highest education level: Not on file  Occupational History  . Not on file  Tobacco Use  . Smoking status: Never Smoker  . Smokeless tobacco: Never Used  Vaping Use  . Vaping Use: Never used  Substance and Sexual Activity  . Alcohol use: No    Alcohol/week: 0.0 standard drinks  . Drug use: No  . Sexual activity: Not Currently    Partners: Male    Birth control/protection: Surgical    Comment: hysterectomy  Other Topics Concern  . Not on file  Social History Narrative  . Not on file   Social Determinants of Health   Financial Resource Strain:   . Difficulty of Paying Living Expenses:   Food Insecurity:   . Worried About Charity fundraiser in the Last Year:   . Arboriculturist in the Last Year:   Transportation Needs:   . Film/video editor (Medical):   Marland Kitchen Lack of Transportation (Non-Medical):   Physical Activity:   . Days of Exercise per Week:   . Minutes of Exercise per Session:   Stress:   . Feeling  of Stress :   Social Connections:   . Frequency of Communication with Friends and Family:   . Frequency of Social Gatherings with Friends and Family:   . Attends Religious Services:   . Active Member of Clubs or Organizations:   . Attends Archivist Meetings:   Marland Kitchen Marital Status:   Intimate Partner Violence:   . Fear of Current or Ex-Partner:   . Emotionally Abused:   Marland Kitchen Physically Abused:   . Sexually Abused:      Physical Exam   Vitals:   05/30/20 1244 05/30/20 1247  BP: 114/63   Pulse: 70   Resp: 20   Temp: 98.8 F (37.1 C)   SpO2: 92% 92%    CONSTITUTIONAL: Well-appearing, NAD NEURO:  Alert and oriented x 3, no focal deficits EYES:  eyes equal and reactive ENT/NECK:  no LAD, no JVD CARDIO: Regular rate, well-perfused, normal S1 and S2 PULM:  CTAB no wheezing or rhonchi GI/GU:  normal bowel sounds, non-distended, non-tender MSK/SPINE:  No gross deformities, 2+ pitting edema to bilateral lower extremities SKIN:  no rash, atraumatic PSYCH:  Appropriate speech and behavior  *Additional and/or pertinent findings included in MDM below  Diagnostic and Interventional Summary    EKG Interpretation  Date/Time:  Monday May 30 2020 13:07:56 EDT Ventricular Rate:  71 PR Interval:    QRS Duration: 95 QT Interval:  555 QTC Calculation: 616 R Axis:   42 Text Interpretation: Atrial fibrillation Borderline low voltage, extremity leads RSR' in V1 or V2, right VCD or RVH Nonspecific T abnrm, anterolateral leads Prolonged QT interval Baseline wander in lead(s) V2 Confirmed by Gerlene Fee [id number]) on 05/30/2020 2:00:38 PM      Labs Reviewed  CBC WITH DIFFERENTIAL/PLATELET - Abnormal; Notable for the following components:      Result Value   RBC 3.66 (*)    Hemoglobin 11.3 (*)    HCT 34.3 (*)    Lymphs Abs 0.3 (*)    All other components within normal limits  BASIC METABOLIC PANEL - Abnormal; Notable for the following components:   Sodium 118 (*)    Chloride 82  (*)    Glucose, Bld 126 (*)    All other components within normal limits  BRAIN NATRIURETIC PEPTIDE - Abnormal; Notable for the following components:   B Natriuretic Peptide 402.7 (*)    All other components within normal limits  SARS CORONAVIRUS 2 BY RT PCR (HOSPITAL ORDER, [facility])  TROPONIN I (HIGH SENSITIVITY)    DG Chest Portable 1 View  Final Result  Medications  furosemide (LASIX) injection 40 mg (has no administration in time range)     Procedures  /  Critical Care .Critical Care Performed by: Maudie Flakes, MD Authorized by: Maudie Flakes, MD   Critical care provider statement:    Critical care time (minutes):  45   Critical care was necessary to treat or prevent imminent or life-threatening deterioration of the following conditions:  Metabolic crisis (Critically low sodium levels)   Critical care was time spent personally by me on the following activities:  Discussions with consultants, evaluation of patient's response to treatment, examination of patient, ordering and performing treatments and interventions, ordering and review of laboratory studies, ordering and review of radiographic studies, pulse oximetry, re-evaluation of patient's condition, obtaining history from patient or surrogate and review of old charts    ED Course and Medical Decision Making  I have reviewed the triage vital signs, the nursing notes, and pertinent available records from the EMR.  Listed above are laboratory and imaging tests that I personally ordered, reviewed, and interpreted and then considered in my medical decision making (see below for details).  Patient presenting with vague symptoms of malaise, fatigue, weakness, dyspnea exertion, there was initial concern for possible COVID-19 infection though her test is negative.  Her x-ray showing pulmonary edema versus atypical infection, her BNP is elevated, and her sodium is critically low at 118.  This  constellation of findings would suggest hyponatremia due to CHF exacerbation.  Providing IV Lasix, will admit to hospitalist service.       Barth Kirks. [place] Small, [place] [email or web address]  Final Clinical Impressions(s) / ED Diagnoses     ICD-10-CM   1. Hyponatremia  E87.1   2. Acute on chronic congestive heart failure, unspecified heart failure type ([place])  I50.9     ED Discharge Orders    None       Discharge Instructions Discussed with and Provided to Patient:   Discharge Instructions   None       Maudie Flakes, MD 05/30/20 1524

## 2020-05-30 NOTE — ED Notes (Addendum)
startd to feel bad 2 weeks ago nauseated and not eating  , wasn't getting any better she states , has had a cough , pt has been vaccinated, felt weak and give out she states  Pt has had vaccine in feb

## 2020-05-30 NOTE — ED Triage Notes (Signed)
Pt c/o weak, dry cough, nausea, SOB x 1 week-NAD-to triage in w/c

## 2020-05-31 LAB — URINALYSIS, ROUTINE W REFLEX MICROSCOPIC
Glucose, UA: 100 mg/dL — AB
Ketones, ur: NEGATIVE mg/dL
Nitrite: POSITIVE — AB
Protein, ur: 30 mg/dL — AB
Specific Gravity, Urine: >1.03 — ABNORMAL HIGH (ref 1.005–1.030)
pH: 6 (ref 5.0–8.0)

## 2020-05-31 LAB — BASIC METABOLIC PANEL
Anion gap: 9 (ref 5–15)
BUN: 11 mg/dL (ref 8–23)
CO2: 26 mmol/L (ref 22–32)
Calcium: 8.3 mg/dL — ABNORMAL LOW (ref 8.9–10.3)
Chloride: 79 mmol/L — ABNORMAL LOW (ref 98–111)
Creatinine, Ser: 0.59 mg/dL (ref 0.44–1.00)
GFR calc Af Amer: >60 mL/min (ref >60)
GFR calc non Af Amer: >60 mL/min (ref >60)
Glucose, Bld: 154 mg/dL — ABNORMAL HIGH (ref 70–99)
Potassium: 3.7 mmol/L (ref 3.5–5.1)
Sodium: 114 mmol/L — CL (ref 135–145)

## 2020-05-31 LAB — URINALYSIS, MICROSCOPIC (REFLEX)

## 2020-05-31 MED ORDER — FUROSEMIDE 10 MG/ML IJ SOLN
40.0000 mg | Freq: Once | INTRAMUSCULAR | Status: AC
  Administered 2020-05-31: 40 mg via INTRAVENOUS
  Filled 2020-05-31: qty 4

## 2020-05-31 MED ORDER — APIXABAN 5 MG PO TABS
5.0000 mg | ORAL_TABLET | Freq: Two times a day (BID) | ORAL | Status: DC
  Administered 2020-05-31 – 2020-06-01 (×4): 5 mg via ORAL
  Filled 2020-05-31 (×3): qty 2
  Filled 2020-05-31: qty 1

## 2020-05-31 MED ORDER — DOCUSATE SODIUM 100 MG PO CAPS
100.0000 mg | ORAL_CAPSULE | Freq: Once | ORAL | Status: AC
  Administered 2020-05-31: 100 mg via ORAL
  Filled 2020-05-31: qty 1

## 2020-05-31 MED ORDER — FENTANYL CITRATE (PF) 100 MCG/2ML IJ SOLN
50.0000 ug | Freq: Once | INTRAMUSCULAR | Status: AC
  Administered 2020-05-31: 50 ug via INTRAVENOUS
  Filled 2020-05-31: qty 2

## 2020-05-31 NOTE — ED Notes (Signed)
Repositioned patient in bed; gave patient fresh warm blankets and emptied trash. Family at bedside.

## 2020-05-31 NOTE — ED Notes (Signed)
Suction canister changed and suction decreased to 8mmHG

## 2020-05-31 NOTE — ED Notes (Signed)
UA resulted, collected earlier.

## 2020-05-31 NOTE — ED Notes (Signed)
BMP for 1858 does not need to be collected; next draw will be at 0058 (06/01/20)

## 2020-05-31 NOTE — ED Notes (Signed)
Discussed with Avon Gully, MD via messaging system; he recommends continued diuresis and BMP q 6hrs

## 2020-06-01 DIAGNOSIS — Z7901 Long term (current) use of anticoagulants: Secondary | ICD-10-CM | POA: Diagnosis not present

## 2020-06-01 DIAGNOSIS — Z902 Acquired absence of lung [part of]: Secondary | ICD-10-CM | POA: Diagnosis not present

## 2020-06-01 DIAGNOSIS — N390 Urinary tract infection, site not specified: Secondary | ICD-10-CM | POA: Diagnosis present

## 2020-06-01 DIAGNOSIS — K219 Gastro-esophageal reflux disease without esophagitis: Secondary | ICD-10-CM | POA: Diagnosis present

## 2020-06-01 DIAGNOSIS — Z9221 Personal history of antineoplastic chemotherapy: Secondary | ICD-10-CM | POA: Diagnosis not present

## 2020-06-01 DIAGNOSIS — I5033 Acute on chronic diastolic (congestive) heart failure: Secondary | ICD-10-CM

## 2020-06-01 DIAGNOSIS — Z66 Do not resuscitate: Secondary | ICD-10-CM | POA: Diagnosis present

## 2020-06-01 DIAGNOSIS — W19XXXA Unspecified fall, initial encounter: Secondary | ICD-10-CM | POA: Diagnosis present

## 2020-06-01 DIAGNOSIS — M810 Age-related osteoporosis without current pathological fracture: Secondary | ICD-10-CM | POA: Diagnosis present

## 2020-06-01 DIAGNOSIS — R110 Nausea: Secondary | ICD-10-CM | POA: Diagnosis not present

## 2020-06-01 DIAGNOSIS — Z886 Allergy status to analgesic agent status: Secondary | ICD-10-CM | POA: Diagnosis not present

## 2020-06-01 DIAGNOSIS — Z20822 Contact with and (suspected) exposure to covid-19: Secondary | ICD-10-CM | POA: Diagnosis present

## 2020-06-01 DIAGNOSIS — D649 Anemia, unspecified: Secondary | ICD-10-CM | POA: Diagnosis present

## 2020-06-01 DIAGNOSIS — Z79899 Other long term (current) drug therapy: Secondary | ICD-10-CM | POA: Diagnosis not present

## 2020-06-01 DIAGNOSIS — Z853 Personal history of malignant neoplasm of breast: Secondary | ICD-10-CM | POA: Diagnosis not present

## 2020-06-01 DIAGNOSIS — I509 Heart failure, unspecified: Secondary | ICD-10-CM | POA: Diagnosis present

## 2020-06-01 DIAGNOSIS — Z96641 Presence of right artificial hip joint: Secondary | ICD-10-CM | POA: Diagnosis present

## 2020-06-01 DIAGNOSIS — E871 Hypo-osmolality and hyponatremia: Secondary | ICD-10-CM | POA: Diagnosis present

## 2020-06-01 DIAGNOSIS — Z8249 Family history of ischemic heart disease and other diseases of the circulatory system: Secondary | ICD-10-CM | POA: Diagnosis not present

## 2020-06-01 DIAGNOSIS — B9620 Unspecified Escherichia coli [E. coli] as the cause of diseases classified elsewhere: Secondary | ICD-10-CM | POA: Diagnosis present

## 2020-06-01 DIAGNOSIS — Z96651 Presence of right artificial knee joint: Secondary | ICD-10-CM | POA: Diagnosis present

## 2020-06-01 DIAGNOSIS — R739 Hyperglycemia, unspecified: Secondary | ICD-10-CM | POA: Diagnosis present

## 2020-06-01 DIAGNOSIS — I5031 Acute diastolic (congestive) heart failure: Secondary | ICD-10-CM | POA: Diagnosis not present

## 2020-06-01 DIAGNOSIS — I2721 Secondary pulmonary arterial hypertension: Secondary | ICD-10-CM | POA: Diagnosis present

## 2020-06-01 DIAGNOSIS — Z9011 Acquired absence of right breast and nipple: Secondary | ICD-10-CM | POA: Diagnosis not present

## 2020-06-01 DIAGNOSIS — I110 Hypertensive heart disease with heart failure: Secondary | ICD-10-CM | POA: Diagnosis present

## 2020-06-01 DIAGNOSIS — Z800 Family history of malignant neoplasm of digestive organs: Secondary | ICD-10-CM | POA: Diagnosis not present

## 2020-06-01 DIAGNOSIS — I4821 Permanent atrial fibrillation: Secondary | ICD-10-CM | POA: Diagnosis present

## 2020-06-01 DIAGNOSIS — S7002XA Contusion of left hip, initial encounter: Secondary | ICD-10-CM | POA: Diagnosis present

## 2020-06-01 DIAGNOSIS — I959 Hypotension, unspecified: Secondary | ICD-10-CM | POA: Diagnosis not present

## 2020-06-01 DIAGNOSIS — J9601 Acute respiratory failure with hypoxia: Secondary | ICD-10-CM | POA: Diagnosis present

## 2020-06-01 LAB — BASIC METABOLIC PANEL
Anion gap: 9 (ref 5–15)
Anion gap: 11 (ref 5–15)
Anion gap: 10 (ref 5–15)
BUN: 12 mg/dL (ref 8–23)
BUN: 11 mg/dL (ref 8–23)
BUN: 13 mg/dL (ref 8–23)
CO2: 26 mmol/L (ref 22–32)
CO2: 26 mmol/L (ref 22–32)
CO2: 26 mmol/L (ref 22–32)
Calcium: 8.2 mg/dL — ABNORMAL LOW (ref 8.9–10.3)
Calcium: 8.3 mg/dL — ABNORMAL LOW (ref 8.9–10.3)
Calcium: 8.2 mg/dL — ABNORMAL LOW (ref 8.9–10.3)
Chloride: 79 mmol/L — ABNORMAL LOW (ref 98–111)
Chloride: 80 mmol/L — ABNORMAL LOW (ref 98–111)
Chloride: 80 mmol/L — ABNORMAL LOW (ref 98–111)
Creatinine, Ser: 0.66 mg/dL (ref 0.44–1.00)
Creatinine, Ser: 0.56 mg/dL (ref 0.44–1.00)
Creatinine, Ser: 0.64 mg/dL (ref 0.44–1.00)
GFR calc Af Amer: >60 mL/min (ref >60)
GFR calc Af Amer: >60 mL/min (ref >60)
GFR calc Af Amer: >60 mL/min (ref >60)
GFR calc non Af Amer: >60 mL/min (ref >60)
GFR calc non Af Amer: >60 mL/min (ref >60)
GFR calc non Af Amer: >60 mL/min (ref >60)
Glucose, Bld: 112 mg/dL — ABNORMAL HIGH (ref 70–99)
Glucose, Bld: 109 mg/dL — ABNORMAL HIGH (ref 70–99)
Glucose, Bld: 112 mg/dL — ABNORMAL HIGH (ref 70–99)
Potassium: 4 mmol/L (ref 3.5–5.1)
Potassium: 4 mmol/L (ref 3.5–5.1)
Potassium: 4.4 mmol/L (ref 3.5–5.1)
Sodium: 114 mmol/L — CL (ref 135–145)
Sodium: 117 mmol/L — CL (ref 135–145)
Sodium: 116 mmol/L — CL (ref 135–145)

## 2020-06-01 LAB — URINALYSIS, ROUTINE W REFLEX MICROSCOPIC
Glucose, UA: NEGATIVE mg/dL
Ketones, ur: NEGATIVE mg/dL
Nitrite: POSITIVE — AB
Protein, ur: 30 mg/dL — AB
Specific Gravity, Urine: 1.025 (ref 1.005–1.030)
pH: 6 (ref 5.0–8.0)

## 2020-06-01 LAB — URINALYSIS, MICROSCOPIC (REFLEX)

## 2020-06-01 LAB — BRAIN NATRIURETIC PEPTIDE: B Natriuretic Peptide: 299.4 pg/mL — ABNORMAL HIGH (ref 0.0–100.0)

## 2020-06-01 LAB — MAGNESIUM: Magnesium: 1.7 mg/dL (ref 1.7–2.4)

## 2020-06-01 LAB — CK: Total CK: 53 U/L (ref 38–234)

## 2020-06-01 MED ORDER — IRBESARTAN 300 MG PO TABS
150.0000 mg | ORAL_TABLET | Freq: Every day | ORAL | Status: DC
  Filled 2020-06-01: qty 1

## 2020-06-01 MED ORDER — SODIUM CHLORIDE 0.9 % IV SOLN: Freq: Once | INTRAVENOUS | Status: AC

## 2020-06-01 MED ORDER — FUROSEMIDE 10 MG/ML IJ SOLN
40.0000 mg | Freq: Three times a day (TID) | INTRAMUSCULAR | Status: DC
  Administered 2020-06-01 – 2020-06-02 (×5): 40 mg via INTRAVENOUS
  Filled 2020-06-01 (×5): qty 4

## 2020-06-01 MED ORDER — ROSUVASTATIN CALCIUM 5 MG PO TABS
10.0000 mg | ORAL_TABLET | Freq: Every day | ORAL | Status: DC
  Administered 2020-06-01: 10 mg via ORAL
  Filled 2020-06-01: qty 2
  Filled 2020-06-01: qty 1
  Filled 2020-06-01: qty 2

## 2020-06-01 MED ORDER — POTASSIUM CHLORIDE CRYS ER 20 MEQ PO TBCR
40.0000 meq | EXTENDED_RELEASE_TABLET | Freq: Two times a day (BID) | ORAL | Status: DC
  Administered 2020-06-01 (×2): 40 meq via ORAL
  Filled 2020-06-01 (×2): qty 2

## 2020-06-01 MED ORDER — SODIUM CHLORIDE 0.9 % IV SOLN
1.0000 g | Freq: Once | INTRAVENOUS | Status: AC
  Administered 2020-06-01: 1 g via INTRAVENOUS
  Filled 2020-06-01: qty 10

## 2020-06-01 NOTE — ED Notes (Signed)
Given report to Ava RN on 3east

## 2020-06-01 NOTE — ED Notes (Signed)
Pt sound has wet cough, EDP aware and fluids stopped. Lungs clear to auscultation

## 2020-06-01 NOTE — ED Notes (Signed)
Report given to [facility] 

## 2020-06-01 NOTE — H&P (Signed)
History and Physical    [name]:[id number] DOB: [birth year] DOA: 05/30/2020  PCP: Marton Redwood, MD Patient coming from: [facility]  Chief Complaint: Generalized weakness  HPI: [name] is a 86 y.o. female with medical history significant of chronic diastolic CHF, permanent A. 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  Patient reports having generalized weakness and nausea for the past few weeks.  States she has not been eating much due to the nausea.  She has not vomited.  Denies abdominal pain or diarrhea.  Her last bowel movement was earlier today.  Also reports having cough, orthopnea, and bilateral lower extremity edema.  Denies chest pain.  Reports taking Lasix once daily at home.  States she is very careful about how much water she drinks as her doctor had previously told her that her sodium was low.  She has been vaccinated against Covid.  States she fell 2 weeks ago while going to the bathroom as she did not switch on the light.  Since the fall she is having pain and swelling in her left knee.  Denies head injury or losing consciousness from the fall.  Denies neck, back, or hip pain.  She is taking Tylenol twice daily which helps with the pain.  ED Course: Afebrile.  Slightly tachypneic.  On 2 L supplemental oxygen.  Work-up done on 8/16: WBC 7.4, hemoglobin 11.3, platelet 196K.  Sodium 118, potassium 3.8, chloride 82, bicarb 24, BUN 9, creatinine 0.5, glucose 126.  High-sensitivity troponin negative x2.  BNP 402.  Chest x-ray showing new bilateral perihilar interstitial and hazy airspace opacities concerning for pulmonary edema or  atypical infection.  Unchanged chronic small right pleural effusion.  SARS-CoV-2 PCR test negative.  Labs done 8/17: Sodium 115 (corrected for slight hyperglycemia).  UA with positive nitrite, small amount of leukocytes, 6-10 WBCs, and few bacteria.  Urine culture pending.  Labs done 8/18: Sodium 114 >117>116.  Repeat UA was done and showed positive nitrite, moderate amount of leukocytes, 11-20 WBCs, and many bacteria.  Repeat urine culture pending.  CK normal.  Magnesium normal.  BNP 299.  Patient was given Eliquis, Colace, fentanyl, Zofran, potassium supplementation, ceftriaxone, and IV Lasix 40 mg x 4 since 8/16.  Patient was started on normal saline infusion at 125 cc/h since 6:12 AM 8/18.  Review of Systems:  All systems reviewed and apart from history of presenting illness, are negative.  Past Medical History:  Diagnosis Date  . Arthritis 1996  . Atrial fibrillation (HCC)    on coumadin  . Atrial tachycardia ([place])   . Cancer [facility]) 1988   breast cancer, R mastectomy, chemo  . 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 Diverticulitis 1999  . Femur fracture ([place]) 03/2013   left, non weight bearing for 2 1/2 weeks  . GERD (gastroesophageal reflux disease)   . Hemorrhoids 1991  . HTN (hypertension)   . Lymphedema of upper extremity    right arm  . Osteoporosis   . PONV (postoperative nausea and vomiting)   . Urinary, incontinence, stress female   . Vitamin D deficiency disease     Past Surgical History:  Procedure Laterality  Date  . APPENDECTOMY    . BREAST BIOPSY  05/04/1987   Right Dr Annamaria Boots  . BREAST IMPLANT REMOVAL Right 06/05/10  . BUNIONECTOMY  1988  . carcinoid Right    Right lower lobectomy 2017  . COLONOSCOPY W/ POLYPECTOMY    . EYE SURGERY  5/14 ; 6/14   Cataract Surgery  . JOINT REPLACEMENT     2002 left; 2009 right Hip, rt knee 2012  . KNEE ARTHROPLASTY Right 2012   Elvina Sidle  . LOBECTOMY Right 01/05/2016    Procedure: RIGHT LOWER LOBE LOBECTOMY;  Surgeon: Melrose Nakayama, MD;  Location: [place];  Service: Thoracic;  Laterality: Right;  . MASTECTOMY PARTIAL / LUMPECTOMY W/ AXILLARY LYMPHADENECTOMY  04/05/1987   Right - Dr Annamaria Boots  . RESECTION OF MEDIASTINAL MASS N/A 01/05/2016   Procedure: RESECTION OF PLEURAL MASS, right;  Surgeon: Melrose Nakayama, MD;  Location: [place];  Service: Thoracic;  Laterality: N/A;  . TONSILLECTOMY    . TOTAL HIP ARTHROPLASTY    . TUBAL LIGATION Bilateral 1970  . VAGINAL HYSTERECTOMY  1977   myomata  . VIDEO ASSISTED THORACOSCOPY (VATS)/WEDGE RESECTION Right 01/05/2016   Procedure: RIGHT VIDEO ASSISTED THORACOSCOPY (VATS)/WEDGE RESECTION;  Surgeon: Melrose Nakayama, MD;  Location: [place];  Service: Thoracic;  Laterality: Right;     reports that she has never smoked. She has never used smokeless tobacco. She reports that she does not drink alcohol and does not use drugs.  Allergies  Allergen Reactions  . Aspirin Rash    Family History  Problem Relation Age of Onset  . Colon cancer Mother   . Colon cancer Father   . Heart disease Father     Prior to Admission medications   Medication Sig Start Date End Date Taking? Authorizing Provider  acetaminophen (TYLENOL) 500 MG tablet Take 2 tablets (1,000 mg total) by mouth every 6 (six) hours as needed for mild pain. 01/17/16   Nani Skillern, PA-C  amLODipine (NORVASC) 10 MG tablet Take 10 mg by mouth daily.    [provider]  apixaban (ELIQUIS) 5 MG TABS tablet Take 5 mg by mouth 2 (two) times daily.    [provider]  Calcium Carbonate-Vitamin D (CALTRATE 600+D) 600-400 MG-UNIT per tablet Take 1 tablet by mouth daily.    [provider]  carboxymethylcellulose (REFRESH PLUS) 0.5 % SOLN Place 1 drop into both eyes 2 (two) times daily as needed (for dry eyes).    [provider]  carvedilol (COREG) 6.25 MG tablet Take 1 tablet (6.25 mg total) by mouth 2 (two) times daily  with a meal. 01/17/16   Gold, Wilder Glade, PA-C  Ergocalciferol (VITAMIN D2) 400 units TABS Take 400 Units by mouth daily.    [provider]  Estradiol (VAGIFEM) 10 MCG TABS vaginal tablet Place 1 tablet (10 mcg total) vaginally 2 (two) times a week. 07/23/19   Regina Eck, CNM  furosemide (LASIX) 20 MG tablet Take 1 tablet (20 mg total) by mouth daily. 02/22/20 05/22/20  O'NealCassie Freer, MD  Multiple Vitamin (MULTIVITAMIN WITH MINERALS) TABS Take 1 tablet by mouth every evening.    [provider]  Omega-3 Fatty Acids (FISH OIL) 1000 MG CAPS Take 1 capsule by mouth 2 (two) times daily.    [provider]  rosuvastatin (CRESTOR) 10 MG tablet Take 10 mg by mouth at bedtime. 12/31/19   [provider]  telmisartan (MICARDIS) 80 MG tablet Take 80 mg by mouth  daily.    [provider]  UNABLE TO FIND Macular supplement    [provider]    Physical Exam: Vitals:   06/01/20 1756 06/01/20 1757 06/01/20 1859 06/01/20 2113  BP:   118/67 (!) 98/50  Pulse:   78 78  Resp: (!) 22 (!) 22 20 20   Temp: 98 F (36.7 C) 98 F (36.7 C) 98.4 F (36.9 C) 98.6 F (37 C)  TempSrc: Oral  Oral Oral  SpO2: 96% 96% 90% 99%  Weight:   64 kg   Height:   4\' 11"  (1.499 m)     Physical Exam Constitutional:      General: She is not in acute distress. HENT:     Head: Normocephalic and atraumatic.  Eyes:     Extraocular Movements: Extraocular movements intact.     Conjunctiva/sclera: Conjunctivae normal.  Cardiovascular:     Rate and Rhythm: Normal rate and regular rhythm.     Pulses: Normal pulses.  Pulmonary:     Effort: Pulmonary effort is normal. No respiratory distress.     Breath sounds: Rales present. No wheezing.     Comments: Mild bibasilar rales Abdominal:     General: Bowel sounds are normal. There is distension.     Palpations: Abdomen is soft.     Tenderness: There is no abdominal tenderness. There is no guarding or rebound.      Comments: Abdomen slightly distended but nontender to palpation  Musculoskeletal:        General: Swelling present.     Cervical back: Normal range of motion and neck supple.     Comments: Left knee appears swollen with limited range of motion +2 pitting edema bilateral lower extremities  Skin:    General: Skin is warm and dry.     Comments: Significant bruising noted to the left hip/lateral thigh region  Neurological:     General: No focal deficit present.     Mental Status: She is alert and oriented to person, place, and time.     Labs on Admission: I have personally reviewed following labs and imaging studies  CBC: Recent Labs  Lab 05/30/20 1249  WBC 7.4  NEUTROABS 6.5  HGB 11.3*  HCT 34.3*  MCV 93.7  PLT 163   Basic Metabolic Panel: Recent Labs  Lab 05/30/20 1249 05/31/20 1726 06/01/20 0336 06/01/20 0958 06/01/20 1649  NA 118* 114* 114* 117* 116*  K 3.8 3.7 4.0 4.0 4.4  CL 82* 79* 79* 80* 80*  CO2 24 26 26 26 26   GLUCOSE 126* 154* 112* 109* 112*  BUN 9 11 12 11 13   CREATININE 0.51 0.59 0.66 0.56 0.64  CALCIUM 9.2 8.3* 8.2* 8.3* 8.2*  MG  --   --   --  1.7  --    GFR: Estimated Creatinine Clearance: 43.3 mL/min (by C-G formula based on SCr of 0.64 mg/dL). Liver Function Tests: No results for input(s): AST, ALT, ALKPHOS, BILITOT, PROT, ALBUMIN in the last 168 hours. No results for input(s): LIPASE, AMYLASE in the last 168 hours. No results for input(s): AMMONIA in the last 168 hours. Coagulation Profile: No results for input(s): INR, PROTIME in the last 168 hours. Cardiac Enzymes: Recent Labs  Lab 06/01/20 0958  CKTOTAL 53   BNP (last 3 results) No results for input(s): PROBNP in the last 8760 hours. HbA1C: No results for input(s): HGBA1C in the last 72 hours. CBG: No results for input(s): GLUCAP in the last 168 hours. Lipid Profile: No  results for input(s): CHOL, HDL, LDLCALC, TRIG, CHOLHDL, LDLDIRECT in the last 72 hours. Thyroid Function  Tests: No results for input(s): TSH, T4TOTAL, FREET4, T3FREE, THYROIDAB in the last 72 hours. Anemia Panel: No results for input(s): VITAMINB12, FOLATE, FERRITIN, TIBC, IRON, RETICCTPCT in the last 72 hours. Urine analysis:    Component Value Date/Time   COLORURINE BROWN (A) 06/01/2020 0515   APPEARANCEUR HAZY (A) 06/01/2020 0515   LABSPEC 1.025 06/01/2020 0515   PHURINE 6.0 06/01/2020 0515   GLUCOSEU NEGATIVE 06/01/2020 0515   HGBUR LARGE (A) 06/01/2020 0515   BILIRUBINUR SMALL (A) 06/01/2020 0515   BILIRUBINUR n 07/23/2013 1043   KETONESUR NEGATIVE 06/01/2020 0515   PROTEINUR 30 (A) 06/01/2020 0515   UROBILINOGEN 0.2 04/23/2015 1100   NITRITE POSITIVE (A) 06/01/2020 0515   LEUKOCYTESUR MODERATE (A) 06/01/2020 0515    Radiological Exams on Admission: No results found.  EKG: Independently reviewed.  A. fib, rate controlled.  QTc 616.  Assessment/Plan Principal Problem:   Acute exacerbation of congestive heart failure (HCC) Active Problems:   Hyponatremia   Nausea   Acute heart failure (HCC)   Acute hypoxemic respiratory failure (HCC)   Acute hypoxemic respiratory failure secondary to acute exacerbation of chronic diastolic CHF: Presenting with complaints of cough, orthopnea, and bilateral lower extremity edema.  BNP elevated.  Chest x-ray with findings concerning for pulmonary edema.  Currently requiring 2 L supplemental oxygen. Echo done April 2021 showing normal LV systolic function with EF of 60 to 27%, diastolic function could not be evaluated, and severely elevated pulmonary artery systolic pressure. -Continue IV Lasix 40 mg every 8 hours.  Repeat echocardiogram.  Monitor intake and output, daily weights, and fluid restriction.  Continuous pulse ox, supplemental oxygen as needed to keep oxygen saturation above 92%.  Severe acute on chronic hyponatremia: Likely hypervolemic hyponatremia in the setting of decompensated CHF.  Sodium 118 two days ago.  It did drop to 114 but  improved to 116 with IV diuresis.  Labs from May 2021 showing sodium 133 and was low in the 120-130 range on previous labs from 2017 as well.  Patient with complaints of nausea.  No seizures or altered mental status. -Continue IV Lasix as mentioned above.  Fluid restriction.  Monitor BMP every 4 hours.  Check serum osmolarity.  Check urine sodium and urine osmolarity.  Nausea: Likely related to severe hyponatremia.  No episodes of vomiting.  No complaints of abdominal pain.  Patient states her last bowel movement was earlier today in the ED.  Abdomen slightly distended but bowel sounds present and nontender to palpation. -Scopolamine patch, Compazine as needed.  Check lipase and LFTs.  Generalized weakness/recent fall: Patient reports falling 2 weeks ago.  Noted to have significant bruising to her left hip/lateral thigh region.  She is complaining of left knee pain and the knee appears swollen with limited range of motion.  Patient denies any head injury or loss of consciousness from the fall.  Denies neck or back pain.  CK normal. -X-ray of left hip and knee.  Fall precautions.  PT and OT evaluation.  Permanent A. fib: Currently rate controlled. -Continue Coreg and Eliquis after pharmacy med rec is done  Normocytic anemia: Hemoglobin 11.3 and MCV 93.7.  Previous hemoglobin on labs done in 2017 was 13.3.  Patient is not endorsing any symptoms of GI bleed. -Check anemia panel, FOBT  UTI: No fever or leukocytosis.  UA done twice in the ED suggestive of infection. -Continue ceftriaxone.  Urine culture  pending.  QT prolongation on EKG -Cardiac monitoring.  Keep potassium above 4 and magnesium above 2.  Avoid QT prolonging drugs.  Repeat EKG in a.m.  DVT prophylaxis: Continue Eliquis after pharmacy med rec is done. Code Status: Patient wishes to be DNR. Family Communication: No family available at this time. Disposition Plan: Status is: Inpatient  Remains inpatient appropriate because:Persistent  severe electrolyte disturbances, IV treatments appropriate due to intensity of illness or inability to take PO and Inpatient level of care appropriate due to severity of illness   Dispo: The patient is from: Home              Anticipated d/c is to: SNF              Anticipated d/c date is: 3 days              Patient currently is not medically stable to d/c.  The medical decision making on this patient was of high complexity and the patient is at high risk for clinical deterioration, therefore this is a level 3 visit.  Shela Leff MD [facility]  If 7PM-7AM, please contact night-coverage [email or web address]  06/02/2020, 12:46 AM

## 2020-06-01 NOTE — Plan of Care (Signed)

## 2020-06-01 NOTE — ED Notes (Signed)
Lab to add on BMP and Mag

## 2020-06-01 NOTE — ED Notes (Signed)
BMP hemolyzed. Has to be reordered and redrawn.

## 2020-06-01 NOTE — ED Provider Notes (Addendum)
Patient has been boarding in the ED for admission.  She was diagnosed with acute exacerbation of congestive heart failure as well as hyponatremia.  Her sodium, which was 118 at presentation is now 114.  She has not been receiving any IV fluids.  Urinalysis had suggested a urinary tract infection and urine was sent for culture.  She had not been given an antibiotic.  Urinalysis done this morning, after her nurse became concerned that it looked dark, is more consistent with a urinary tract infection.  Rocephin 1 g IV was ordered.  We also started the patient on normal saline at 12mL/h as I am concerned she may becoming dehydrated. Will check CK due to dark urine to evaluate for rhabdomyolysis.  The patient states she is comfortable.  Her abdomen is nontender but palpation of the suprapubic area does reproduce a sensation of pressure.   Harvir Patry, Jenny Reichmann, MD 06/01/20 0555    Shanon Rosser, MD 06/01/20 0557    Shanon Rosser, MD 06/01/20 [phone]

## 2020-06-01 NOTE — ED Notes (Signed)
Unsuccessful blood draw x 2, right arm restricted

## 2020-06-02 ENCOUNTER — Inpatient Hospital Stay (HOSPITAL_COMMUNITY): Payer: Medicare Other

## 2020-06-02 DIAGNOSIS — I509 Heart failure, unspecified: Secondary | ICD-10-CM

## 2020-06-02 DIAGNOSIS — I5033 Acute on chronic diastolic (congestive) heart failure: Secondary | ICD-10-CM

## 2020-06-02 DIAGNOSIS — J9601 Acute respiratory failure with hypoxia: Secondary | ICD-10-CM

## 2020-06-02 DIAGNOSIS — I5031 Acute diastolic (congestive) heart failure: Secondary | ICD-10-CM

## 2020-06-02 DIAGNOSIS — E871 Hypo-osmolality and hyponatremia: Secondary | ICD-10-CM

## 2020-06-02 DIAGNOSIS — R110 Nausea: Secondary | ICD-10-CM

## 2020-06-02 LAB — BASIC METABOLIC PANEL
Anion gap: 11 (ref 5–15)
Anion gap: 9 (ref 5–15)
BUN: 13 mg/dL (ref 8–23)
BUN: 15 mg/dL (ref 8–23)
CO2: 27 mmol/L (ref 22–32)
CO2: 25 mmol/L (ref 22–32)
Calcium: 8.4 mg/dL — ABNORMAL LOW (ref 8.9–10.3)
Calcium: 8.2 mg/dL — ABNORMAL LOW (ref 8.9–10.3)
Chloride: 81 mmol/L — ABNORMAL LOW (ref 98–111)
Chloride: 85 mmol/L — ABNORMAL LOW (ref 98–111)
Creatinine, Ser: 0.94 mg/dL (ref 0.44–1.00)
Creatinine, Ser: 0.99 mg/dL (ref 0.44–1.00)
GFR calc Af Amer: >60 mL/min (ref >60)
GFR calc Af Amer: >60 mL/min (ref >60)
GFR calc non Af Amer: 56 mL/min — ABNORMAL LOW (ref >60)
GFR calc non Af Amer: 53 mL/min — ABNORMAL LOW (ref >60)
Glucose, Bld: 129 mg/dL — ABNORMAL HIGH (ref 70–99)
Glucose, Bld: 124 mg/dL — ABNORMAL HIGH (ref 70–99)
Potassium: 5.1 mmol/L (ref 3.5–5.1)
Potassium: 4.8 mmol/L (ref 3.5–5.1)
Sodium: 119 mmol/L — CL (ref 135–145)
Sodium: 119 mmol/L — CL (ref 135–145)

## 2020-06-02 LAB — IRON AND TIBC
Iron: 25 ug/dL — ABNORMAL LOW (ref 28–170)
Saturation Ratios: 11 % (ref 10.4–31.8)
TIBC: 232 ug/dL — ABNORMAL LOW (ref 250–450)
UIBC: 207 ug/dL

## 2020-06-02 LAB — TSH: TSH: 1.293 u[IU]/mL (ref 0.350–4.500)

## 2020-06-02 LAB — MAGNESIUM: Magnesium: 1.5 mg/dL — ABNORMAL LOW (ref 1.7–2.4)

## 2020-06-02 LAB — OSMOLALITY, URINE: Osmolality, Ur: 285 mOsm/kg — ABNORMAL LOW (ref 300–900)

## 2020-06-02 LAB — RETICULOCYTES
Immature Retic Fract: 11.2 % (ref 2.3–15.9)
RBC.: 3.66 MIL/uL — ABNORMAL LOW (ref 3.87–5.11)
Retic Count, Absolute: 102.1 10*3/uL (ref 19.0–186.0)
Retic Ct Pct: 2.8 % (ref 0.4–3.1)

## 2020-06-02 LAB — FOLATE: Folate: 20.3 ng/mL (ref >5.9)

## 2020-06-02 LAB — ECHOCARDIOGRAM COMPLETE
Height: 59 in
S' Lateral: 2.5 cm
Weight: 2194.02 oz

## 2020-06-02 LAB — SODIUM: Sodium: 122 mmol/L — ABNORMAL LOW (ref 135–145)

## 2020-06-02 LAB — LIPASE, BLOOD: Lipase: 52 U/L — ABNORMAL HIGH (ref 11–51)

## 2020-06-02 LAB — HEPATIC FUNCTION PANEL
ALT: 17 U/L (ref 0–44)
AST: 32 U/L (ref 15–41)
Albumin: 2.9 g/dL — ABNORMAL LOW (ref 3.5–5.0)
Alkaline Phosphatase: 67 U/L (ref 38–126)
Bilirubin, Direct: 0.4 mg/dL — ABNORMAL HIGH (ref 0.0–0.2)
Indirect Bilirubin: 1.3 mg/dL — ABNORMAL HIGH (ref 0.3–0.9)
Total Bilirubin: 1.7 mg/dL — ABNORMAL HIGH (ref 0.3–1.2)
Total Protein: 5.9 g/dL — ABNORMAL LOW (ref 6.5–8.1)

## 2020-06-02 LAB — URINE CULTURE

## 2020-06-02 LAB — VITAMIN B12: Vitamin B-12: 1179 pg/mL — ABNORMAL HIGH (ref 180–914)

## 2020-06-02 LAB — SODIUM, URINE, RANDOM: Sodium, Ur: 15 mmol/L

## 2020-06-02 LAB — OSMOLALITY
Osmolality: 251 mOsm/kg — ABNORMAL LOW (ref 275–295)
Osmolality: 254 mOsm/kg — ABNORMAL LOW (ref 275–295)

## 2020-06-02 LAB — FERRITIN: Ferritin: 227 ng/mL (ref 11–307)

## 2020-06-02 MED ORDER — SCOPOLAMINE 1 MG/3DAYS TD PT72
1.0000 | MEDICATED_PATCH | TRANSDERMAL | Status: DC
  Administered 2020-06-02 – 2020-06-07 (×3): 1.5 mg via TRANSDERMAL
  Filled 2020-06-02 (×3): qty 1

## 2020-06-02 MED ORDER — AMLODIPINE BESYLATE 10 MG PO TABS: 10.0000 mg | ORAL_TABLET | Freq: Every day | ORAL | Status: DC

## 2020-06-02 MED ORDER — APIXABAN 2.5 MG PO TABS
2.5000 mg | ORAL_TABLET | Freq: Two times a day (BID) | ORAL | Status: DC
  Administered 2020-06-02 – 2020-06-07 (×11): 2.5 mg via ORAL
  Filled 2020-06-02 (×11): qty 1

## 2020-06-02 MED ORDER — CHOLECALCIFEROL 10 MCG (400 UNIT) PO TABS
400.0000 [IU] | ORAL_TABLET | Freq: Every day | ORAL | Status: DC
  Administered 2020-06-02 – 2020-06-08 (×7): 400 [IU] via ORAL
  Filled 2020-06-02 (×8): qty 1

## 2020-06-02 MED ORDER — SODIUM CHLORIDE 0.9 % IV SOLN
250.0000 mL | INTRAVENOUS | Status: DC | PRN
  Administered 2020-06-03: 250 mL via INTRAVENOUS

## 2020-06-02 MED ORDER — ACETAMINOPHEN 325 MG PO TABS
650.0000 mg | ORAL_TABLET | ORAL | Status: DC | PRN
  Administered 2020-06-02 – 2020-06-07 (×7): 650 mg via ORAL
  Filled 2020-06-02 (×7): qty 2

## 2020-06-02 MED ORDER — CALCIUM CARBONATE-VITAMIN D 500-200 MG-UNIT PO TABS
1.0000 | ORAL_TABLET | Freq: Every day | ORAL | Status: DC
  Administered 2020-06-02 – 2020-06-08 (×7): 1 via ORAL
  Filled 2020-06-02 (×7): qty 1

## 2020-06-02 MED ORDER — MAGNESIUM SULFATE 2 GM/50ML IV SOLN
2.0000 g | Freq: Once | INTRAVENOUS | Status: AC
  Administered 2020-06-02: 2 g via INTRAVENOUS
  Filled 2020-06-02: qty 50

## 2020-06-02 MED ORDER — ADULT MULTIVITAMIN W/MINERALS CH
1.0000 | ORAL_TABLET | Freq: Every evening | ORAL | Status: DC
  Administered 2020-06-02 – 2020-06-05 (×4): 1 via ORAL
  Filled 2020-06-02 (×5): qty 1

## 2020-06-02 MED ORDER — TOLVAPTAN 15 MG PO TABS
7.5000 mg | ORAL_TABLET | Freq: Once | ORAL | Status: AC
  Administered 2020-06-02: 7.5 mg via ORAL
  Filled 2020-06-02: qty 1

## 2020-06-02 MED ORDER — PANTOPRAZOLE SODIUM 40 MG PO TBEC
40.0000 mg | DELAYED_RELEASE_TABLET | Freq: Every day | ORAL | Status: DC
  Administered 2020-06-02 – 2020-06-08 (×7): 40 mg via ORAL
  Filled 2020-06-02 (×7): qty 1

## 2020-06-02 MED ORDER — POLYVINYL ALCOHOL 1.4 % OP SOLN
1.0000 [drp] | OPHTHALMIC | Status: DC | PRN
  Filled 2020-06-02: qty 15

## 2020-06-02 MED ORDER — VITAMIN D2 10 MCG (400 UNIT) PO TABS: 400.0000 [IU] | ORAL_TABLET | Freq: Every day | ORAL | Status: DC

## 2020-06-02 MED ORDER — SODIUM CHLORIDE 0.9% FLUSH
3.0000 mL | Freq: Two times a day (BID) | INTRAVENOUS | Status: DC
  Administered 2020-06-02 – 2020-06-05 (×6): 3 mL via INTRAVENOUS

## 2020-06-02 MED ORDER — SODIUM CHLORIDE 0.9% FLUSH: 3.0000 mL | INTRAVENOUS | Status: DC | PRN

## 2020-06-02 MED ORDER — ESTRADIOL 10 MCG VA TABS
1.0000 | ORAL_TABLET | VAGINAL | Status: DC
  Filled 2020-06-02: qty 1

## 2020-06-02 MED ORDER — PROCHLORPERAZINE EDISYLATE 10 MG/2ML IJ SOLN
5.0000 mg | Freq: Four times a day (QID) | INTRAMUSCULAR | Status: DC | PRN
  Filled 2020-06-02: qty 1

## 2020-06-02 MED ORDER — CARVEDILOL 6.25 MG PO TABS
6.2500 mg | ORAL_TABLET | Freq: Two times a day (BID) | ORAL | Status: DC
  Administered 2020-06-02 – 2020-06-08 (×12): 6.25 mg via ORAL
  Filled 2020-06-02 (×14): qty 1

## 2020-06-02 MED ORDER — ACETAMINOPHEN 500 MG PO TABS: 1000.0000 mg | ORAL_TABLET | Freq: Four times a day (QID) | ORAL | Status: DC | PRN

## 2020-06-02 MED ORDER — SODIUM CHLORIDE 0.9 % IV SOLN
1.0000 g | INTRAVENOUS | Status: DC
  Administered 2020-06-03: 1 g via INTRAVENOUS
  Filled 2020-06-02 (×2): qty 10

## 2020-06-02 MED ORDER — CARBOXYMETHYLCELLULOSE SODIUM 0.5 % OP SOLN
1.0000 [drp] | Freq: Two times a day (BID) | OPHTHALMIC | Status: DC | PRN
  Filled 2020-06-02: qty 1

## 2020-06-02 NOTE — Consult Note (Addendum)
[facility] Renal Consultation Note  Requesting MD: Eleonore Chiquito, MD Indication for Consultation:  Hyponatremia   Chief complaint: shortness of breath and weakness  HPI:  [name] is a 86 y.o. female with a history of chronic diastolic CHF, afib on coumadin, pulm artery HTN, lung cancer s/p right lower lobectomy in 2017 who presented to the hospital with weakness and shortness of breath.  Covid test was negative.  She has received several doses of lasix 40 mg IV which is now set at every 8 hours; cardiology has recommended stopping Lasix per their consult today.  She states that her breathing is much better.  Still does have some abd bloating.  She has been on 2 L of oxygen reports that she is not on any oxygen at home.  She was found to be hyponatremic and nephrology is consulted for assistance with the same.  Sodium was 118 on presentation.  Nadir sodium of 114 on 8/17.  Her sodium is up to 119 on 06/02/20.  Last check 9:29 am and unchanged from check earlier this AM.  Home medications include lasix 20 mg daily.  She had 750 mL uop over 8/18 charted.  Doesn't appear that strict ins/outs are available.   Her daughter is at bedside.  Her daughter states that she had a hospitalization several years ago for hyponatremia and during that time they gave her fluids.  Note sodium was 133 on 02/2020 check here.  CXR on admission with unchanged chronic small right pleural effusion and new bilateral perihilar interstitial and hazy opacities concerning for edema or atypical infection.   PMHx:   Past Medical History:  Diagnosis Date  . Arthritis 1996  . Atrial fibrillation (HCC)    on coumadin  . Atrial tachycardia ([place])   . Cancer [facility]) 1988   breast cancer, R mastectomy, chemo  . 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 Diverticulitis 1999  . Femur fracture ([place]) 03/2013   left, non weight bearing for 2 1/2 weeks  . GERD (gastroesophageal  reflux disease)   . Hemorrhoids 1991  . HTN (hypertension)   . Lymphedema of upper extremity    right arm  . Osteoporosis   . PONV (postoperative nausea and vomiting)   . Urinary, incontinence, stress female   . Vitamin D deficiency disease     Past Surgical History:  Procedure Laterality Date  . APPENDECTOMY    . BREAST BIOPSY  05/04/1987   Right Dr Annamaria Boots  . BREAST IMPLANT REMOVAL Right 06/05/10  . BUNIONECTOMY  1988  . carcinoid Right    Right lower lobectomy 2017  . COLONOSCOPY W/ POLYPECTOMY    . EYE SURGERY  5/14 ; 6/14   Cataract Surgery  . JOINT REPLACEMENT     2002 left; 2009 right Hip, rt knee 2012  . KNEE ARTHROPLASTY Right 2012   [facility] Sidle  . LOBECTOMY Right 01/05/2016   Procedure: RIGHT LOWER LOBE LOBECTOMY;  Surgeon: Melrose Nakayama, MD;  Location: [place];  Service: Thoracic;  Laterality: Right;  . MASTECTOMY PARTIAL / LUMPECTOMY W/ AXILLARY LYMPHADENECTOMY  04/05/1987   Right - Dr Annamaria Boots  . RESECTION OF MEDIASTINAL MASS N/A 01/05/2016   Procedure: RESECTION OF PLEURAL MASS, right;  Surgeon: Melrose Nakayama, MD;  Location: [place];  Service: Thoracic;  Laterality: N/A;  . TONSILLECTOMY    . TOTAL HIP ARTHROPLASTY    . TUBAL LIGATION Bilateral 1970  . VAGINAL HYSTERECTOMY  1977   myomata  . VIDEO ASSISTED THORACOSCOPY (VATS)/WEDGE RESECTION Right 01/05/2016   Procedure: RIGHT VIDEO ASSISTED THORACOSCOPY (VATS)/WEDGE RESECTION;  Surgeon: Melrose Nakayama, MD;  Location: [place];  Service: Thoracic;  Laterality: Right;    Family Hx:  Family History  Problem Relation Age of Onset  . Colon cancer Mother   . Colon cancer Father   . Heart disease Father   They are not aware of any family history of hyponatremia  Social History:  reports that she has never smoked. She has never used smokeless tobacco. She reports that she does not drink alcohol and does not use drugs.  Allergies:  Allergies  Allergen Reactions  . Aspirin Rash    Medications: Prior  to Admission medications   Medication Sig Start Date End Date Taking? Authorizing Provider  acetaminophen (TYLENOL) 500 MG tablet Take 2 tablets (1,000 mg total) by mouth every 6 (six) hours as needed for mild pain. 01/17/16  Yes Lars Pinks M, PA-C  amLODipine (NORVASC) 10 MG tablet Take 10 mg by mouth daily.   Yes [provider]  apixaban (ELIQUIS) 5 MG TABS tablet Take 5 mg by mouth 2 (two) times daily.   Yes [provider]  Calcium Carbonate-Vitamin D (CALTRATE 600+D) 600-400 MG-UNIT per tablet Take 1 tablet by mouth daily.   Yes [provider]  carboxymethylcellulose (REFRESH PLUS) 0.5 % SOLN Place 1 drop into both eyes 2 (two) times daily as needed (for dry eyes).   Yes [provider]  carvedilol (COREG) 6.25 MG tablet Take 1 tablet (6.25 mg total) by mouth 2 (two) times daily with a meal. 01/17/16  Yes Gold, Wilder Glade, PA-C  Ergocalciferol (VITAMIN D2) 400 units TABS Take 400 Units by mouth daily.   Yes [provider]  Estradiol (VAGIFEM) 10 MCG TABS vaginal tablet Place 1 tablet (10 mcg total) vaginally 2 (two) times a week. 07/23/19  Yes Regina Eck, CNM  furosemide (LASIX) 20 MG tablet Take 1 tablet (20 mg total) by mouth daily. 02/22/20 06/02/20 Yes O'Neal, Cassie Freer, MD  Multiple Vitamin (MULTIVITAMIN WITH MINERALS) TABS Take 1 tablet by mouth every evening.   Yes [provider]  Omega-3 Fatty Acids (FISH OIL) 1000 MG CAPS Take 1 capsule by mouth 2 (two) times daily.   Yes [provider]  pantoprazole (PROTONIX) 40 MG tablet Take 40 mg by mouth daily. 05/18/20  Yes [provider]  rosuvastatin (CRESTOR) 10 MG tablet Take 10 mg by mouth at bedtime. 12/31/19  Yes [provider]  telmisartan (MICARDIS) 80 MG tablet Take 80 mg by mouth daily.   Yes [provider]  UNABLE TO FIND Place 2 drops into both eyes daily. Macular eye drops   Yes [provider]    I have reviewed the  patient's current medications.  Labs:  BMP Latest Ref Rng & Units 06/02/2020 06/02/2020 06/01/2020  Glucose 70 - 99 mg/dL 124(H) 129(H) 112(H)  BUN 8 - 23 mg/dL 15 13 13   Creatinine 0.44 - 1.00 mg/dL 0.99 0.94 0.64  BUN/Creat Ratio 12 - 28 - - -  Sodium 135 - 145 mmol/L 119(LL) 119(LL) 116(LL)  Potassium 3.5 - 5.1 mmol/L 4.8 5.1 4.4  Chloride 98 - 111 mmol/L 85(L) 81(L) 80(L)  CO2 22 - 32 mmol/L 25 27 26   Calcium 8.9 - 10.3 mg/dL 8.2(L) 8.4(L) 8.2(L)    Urinalysis    Component Value Date/Time   COLORURINE BROWN (A) 06/01/2020 0515   APPEARANCEUR  HAZY (A) 06/01/2020 0515   LABSPEC 1.025 06/01/2020 0515   PHURINE 6.0 06/01/2020 0515   GLUCOSEU NEGATIVE 06/01/2020 0515   HGBUR LARGE (A) 06/01/2020 0515   BILIRUBINUR SMALL (A) 06/01/2020 0515   BILIRUBINUR n 07/23/2013 1043   KETONESUR NEGATIVE 06/01/2020 0515   PROTEINUR 30 (A) 06/01/2020 0515   UROBILINOGEN 0.2 04/23/2015 1100   NITRITE POSITIVE (A) 06/01/2020 0515   LEUKOCYTESUR MODERATE (A) 06/01/2020 0515     ROS:  Pertinent items noted in HPI and remainder of comprehensive ROS otherwise negative.  Physical Exam: Vitals:   06/02/20 0048 06/02/20 0442  BP: (!) 107/59 (!) 103/58  Pulse: 85 72  Resp: 19 18  Temp: 98.9 F (37.2 C) 98.2 F (36.8 C)  SpO2: 100% 100%     General: Elderly HEENT: Normocephalic atraumatic Eyes: Extraocular movements intact sclera anicteric Neck: Supple trachea midline Heart: S1-S2 no rub appreciated Lungs: Unlabored on exam 2 L O2 per nasal cannula left basilar crackles and right lung occ wheeze Abdomen: Nontender soft and not overtly distended Extremities: No pitting edema appreciated.  No cyanosis or clubbing Skin: No rash on extremities exposed Neuro: Alert and oriented x3 provides history and follows commands Psych normal mood and affect  Assessment/Plan:  # Hyponatremia  - Hx fluid overload and admission with CHF which appears essentially resolved.  She is conversant and  pleasant and can respond to thirst - Discontinued Lasix  - Tolvaptan 7.5 mg PO once now (one-time dose) - Discontinued the fluid restriction from her diet order.  Will need to reassess on 8/20 24 hours after tolvaptan - Check sodium every 8 hours x 24 hours after tolvaptan and notify nephrology if sodium rises to 126 or higher tonight  - Note serum and urine osms and urine sodium are ordered and note that TSH is in process - Check cortisol in AM  - Note she has a history of malignancy - may need to consider CT chest if no improvement  # Acute on chronic diastolic CHF  - Resolved  - 2 liters oxygen on most recent charting and not on oxygen at home   - Stop lasix as above - Please obtain strict ins/outs and daily weights   # HTN  - acceptable control on regimen directed at afib and CHF   # Hx lung cancer  - lung cancer s/p right lower lobectomy in 2017 - as above may need to consider CT chest if no improvement  # UTI  - per primary team; on ceftriaxone   # Permanent a fib - on beta blocker; rate control per primary team     Claudia Desanctis 06/02/2020, 4:30 PM

## 2020-06-02 NOTE — Plan of Care (Signed)
  Problem: Clinical Measurements: Goal: Diagnostic test results will improve Outcome: Progressing Goal: Respiratory complications will improve Outcome: Progressing   

## 2020-06-02 NOTE — Plan of Care (Signed)

## 2020-06-02 NOTE — Progress Notes (Signed)
PROGRESS NOTE  [name] DPO:[id number] DOB: [birth year] DOA: 05/30/2020 PCP: Marton Redwood, MD   LOS: 1 day   Brief narrative: As per HPI,  [name] is a 86 y.o. female with medical history significant of chronic diastolic CHF, permanent A. 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  Patient reports having generalized weakness and nausea for the past few weeks.  She has been vaccinated against Covid.  States she fell 2 weeks ago while going to the bathroom as she did not switch on the light.  Since the fall she is having pain and swelling in her left knee.  In the ED,  Afebrile.  Slightly tachypneic.  On 2 L supplemental oxygen.  Chest x-ray showing new bilateral perihilar interstitial and hazy airspace opacities concerning for pulmonary edema or atypical infection.  Unchanged chronic small right pleural effusion.  SARS-CoV-2 PCR test negative. Sodium 114 >117>116.  Repeat UA was done and showed positive nitrite, moderate amount of leukocytes, 11-20 WBCs, and many bacteria.  CK normal.  Magnesium normal.  BNP 299.  Patient was then considered for admission to the hospital.  Assessment/Plan:  Principal Problem:   Acute exacerbation of congestive heart failure (HCC) Active Problems:   Hyponatremia   Nausea   Acute heart failure (HCC)   Acute hypoxemic respiratory failure (HCC)   Acute hypoxemic respiratory failure secondary to acute exacerbation of chronic diastolic CHF:   BNP elevated.  Chest x-ray with findings concerning for pulmonary edema.  Currently requiring 2 L supplemental oxygen. Echo done April 2021 showing normal LV  systolic function with EF of 60 to 65%, with severely elevated pulmonary artery systolic pressure.  Currently on IV Lasix 40 every 8hr.  Continue daily weights strict intake and output charting.  Severe acute symptomatic hyponatremia on chronic hyponatremia: Likely hypervolemic hyponatremia in the setting of decompensated CHF.  Labs from May 2021 showing sodium 133 and was low in the 120-130 range on previous labs from 2017 as well.    BMP closely.  On IV Lasix at this time.  Consult cardiology due to severe hyponatremia history of CHF and volume overload, patient might benefit from tolvaptan.  Check urinary osmolality, urinary sodium.  Serum osmolality is 251 and slightly low  Nausea: Likely related to severe hyponatremia. Scopolamine patch, Compazine as needed.    Lipase of 52.  Mild hypomagnesemia.  Will replace.  Check levels in a.m.  Generalized weakness/recent fall:  bruising to her left hip/lateral thigh region with left knee pain. CK normal. X-ray of left hip and knee reviewed with no acute fracture or dislocation but severe osteoarthritis of the left knee.  Signs of Total hip arthroplasty.   Continue fall precautions.  PT and OT evaluation.  Permanent A. fib: Currently rate controlled.  Resume Coreg and Eliquis   Normocytic anemia monitor closely.  Check CBC in a.m.  Iron level is 25.  Elevated vitamin B12 levels.  Occult blood pending.  E. coli UTI: Continue ceftriaxone IV.  Follow sensitivity  QT prolongation on EKG Telemetry monitor.  Check EKG in a.m.  Keep potassium above 4 and magnesium above 2.  We will try to avoid QT prolonging  medication  DVT prophylaxis: Resume  Eliquis  Code Status: DO NOT RESUSCITATE  Family Communication: I spoke with the patient's daughter at bedside and updated her about the clinical condition of the patient..  Status is: Inpatient  Remains inpatient appropriate because:Unsafe d/c plan, IV treatments appropriate due to intensity of illness or  inability to take PO and Inpatient level of care appropriate due to severity of illness   Dispo: The patient is from: Home              Anticipated d/c is to: SNF              Anticipated d/c date is: 2 to 3 days.              Patient currently is not medically stable to d/c.  Consultants:  Cardiology  Procedures:  None  Antibiotics:  . Rocephin IV 8/18>  Anti-infectives (From admission, onward)   Start     Dose/Rate Route Frequency Ordered Stop   06/02/20 0600  cefTRIAXone (ROCEPHIN) 1 g in sodium chloride 0.9 % 100 mL IVPB        1 g 200 mL/hr over 30 Minutes Intravenous Every 24 hours 06/02/20 0039     06/01/20 0600  cefTRIAXone (ROCEPHIN) 1 g in sodium chloride 0.9 % 100 mL IVPB        1 g 200 mL/hr over 30 Minutes Intravenous  Once 06/01/20 0552 06/01/20 0644     Subjective: Today, patient was seen and examined at bedside.  Patient complains of nausea, fatigue weakness.  She did have some heartburn and dyspepsia yesterday.  Pleasant mild shortness of breath.  Denies chest pain, fever or chills.  Objective: Vitals:   06/02/20 0048 06/02/20 0442  BP: (!) 107/59 (!) 103/58  Pulse: 85 72  Resp: 19 18  Temp: 98.9 F (37.2 C) 98.2 F (36.8 C)  SpO2: 100% 100%    Intake/Output Summary (Last 24 hours) at 06/02/2020 1339 Last data filed at 06/02/2020 0800 Gross per 24 hour  Intake 378.54 ml  Output 750 ml  Net -371.46 ml   Filed Weights   06/01/20 1859 06/02/20 0434  Weight: 64 kg 62.2 kg   Body mass index is 27.7 kg/m.   Physical Exam:  GENERAL: Patient is alert, awake and communicative not in obvious distress. HENT: No scleral pallor or icterus. Pupils equally reactive to light. Oral mucosa is moist NECK: is supple, no gross swelling noted. CHEST: Diminished breath sounds bilaterally.  Coarse breath sounds noted. CVS: S1 and S2 heard, no murmur.  Irregular rhythm. ABDOMEN: Soft, mildly distended, non-tender, bowel sounds are present. EXTREMITIES: Legs  with bilateral lower extremity edema++.  Left hip with bruise.  Left knee swollen. CNS: Cranial nerves are intact. No focal motor deficits. SKIN: warm and dry without rashes.  Data Review: I have personally reviewed the following laboratory data and studies,  CBC: Recent Labs  Lab 05/30/20 1249  WBC 7.4  NEUTROABS 6.5  HGB 11.3*  HCT 34.3*  MCV 93.7  PLT 314   Basic Metabolic Panel: Recent Labs  Lab 06/01/20 0336 06/01/20 0958 06/01/20 1649 06/02/20 0117 06/02/20 0925  NA 114* 117* 116* 119* 119*  K 4.0 4.0 4.4 5.1 4.8  CL 79* 80* 80* 81* 85*  CO2 26 26 26 27 25   GLUCOSE 112* 109* 112* 129* 124*  BUN 12 11 13 13 15   CREATININE 0.66 0.56 0.64 0.94 0.99  CALCIUM 8.2* 8.3* 8.2* 8.4* 8.2*  MG  --  1.7  --  1.5*  --  Liver Function Tests: Recent Labs  Lab 06/02/20 0117  AST 32  ALT 17  ALKPHOS 67  BILITOT 1.7*  PROT 5.9*  ALBUMIN 2.9*   Recent Labs  Lab 06/02/20 0117  LIPASE 52*   No results for input(s): AMMONIA in the last 168 hours. Cardiac Enzymes: Recent Labs  Lab 06/01/20 0958  CKTOTAL 53   BNP (last 3 results) Recent Labs    01/21/20 1502 05/30/20 1249 06/01/20 0958  BNP 173.4* 402.7* 299.4*    ProBNP (last 3 results) No results for input(s): PROBNP in the last 8760 hours.  CBG: No results for input(s): GLUCAP in the last 168 hours. Recent Results (from the past 240 hour(s))  SARS Coronavirus 2 by RT PCR (hospital order, performed in [facility] hospital lab) Nasopharyngeal Nasopharyngeal Swab     Status: None   Collection Time: 05/30/20  1:35 PM   Specimen: Nasopharyngeal Swab  Result Value Ref Range Status   SARS Coronavirus 2 NEGATIVE NEGATIVE Final    Comment: (NOTE) SARS-CoV-2 target nucleic acids are NOT DETECTED.  The SARS-CoV-2 RNA is generally detectable in upper and lower respiratory specimens during the acute phase of infection. The lowest concentration of SARS-CoV-2 viral copies this assay can detect is 250 copies /  mL. A negative result does not preclude SARS-CoV-2 infection and should not be used as the sole basis for treatment or other patient management decisions.  A negative result may occur with improper specimen collection / handling, submission of specimen other than nasopharyngeal swab, presence of viral mutation(s) within the areas targeted by this assay, and inadequate number of viral copies (<250 copies / mL). A negative result must be combined with clinical observations, patient history, and epidemiological information.  Fact Sheet for Patients:   StrictlyIdeas.no  Fact Sheet for Healthcare Providers: BankingDealers.co.za  This test is not yet approved or  cleared by the [place] FDA and has been authorized for detection and/or diagnosis of SARS-CoV-2 by FDA under an Emergency Use Authorization (EUA).  This EUA will remain in effect (meaning this test can be used) for the duration of the COVID-19 declaration under Section 564(b)(1) of the Act, 21 U.S.C. section 360bbb-3(b)(1), unless the authorization is terminated or revoked sooner.  Performed at [facility], [address], [place], [place] [address]   Urine culture     Status: Abnormal   Collection Time: 05/31/20  8:00 PM   Specimen: Urine, Random  Result Value Ref Range Status   Specimen Description   Final    URINE, RANDOM Performed at [facility], [address] [place], [place] [address]    Special Requests   Final    NONE Performed at [facility], [address] [place], [place] [address]    Culture MULTIPLE SPECIES PRESENT, SUGGEST RECOLLECTION (A)  Final   Report Status 06/02/2020 FINAL  Final  Urine culture     Status: Abnormal (Preliminary result)   Collection Time: 06/01/20  5:15 AM   Specimen: Urine, Clean Catch  Result Value Ref Range Status   Specimen Description   Final    URINE, CLEAN CATCH Performed at [facility], [address] [place], [place] [address]    Special Requests   Final    NONE Performed at [facility], [address] [place], [place] [address]    Culture (A)  Final    >=100,000 COLONIES/mL ESCHERICHIA COLI CULTURE REINCUBATED FOR  BETTER GROWTH Performed at [facility], [place] [address] [place], [place] [address]    Report Status PENDING  Incomplete     Studies: DG Knee 1-2 Views Left  Result Date: 06/02/2020 CLINICAL DATA:  Lower extremity swelling EXAM: LEFT KNEE - 1-2 VIEW COMPARISON:  None. FINDINGS: No acute fracture or dislocation. Severe osteoarthritis of the left knee most pronounced within the lateral and patellofemoral compartments. Moderate-sized knee joint effusion. Mineralized loose body posterior to the knee. Chondrocalcinosis. Mild soft tissue edema. IMPRESSION: 1. No acute fracture or dislocation. 2. Severe osteoarthritis of the left knee with moderate-sized knee joint effusion. Electronically Signed   By: Davina Poke D.O.   On: 06/02/2020 07:58   DG HIP UNILAT WITH PELVIS 2-3 VIEWS LEFT  Result Date: 06/02/2020 CLINICAL DATA:  Lower extremity edema EXAM: DG HIP (WITH OR WITHOUT PELVIS) 2-3V LEFT COMPARISON:  04/18/2013 FINDINGS: Status post left total hip arthroplasty. Arthroplasty components are in their expected alignment without evidence of periprosthetic lucency or fracture. Partially visualized right total hip arthroplasty, grossly intact. Bones are diffusely demineralized. Partially visualized lumbar spondylosis. IMPRESSION: Status post left total hip arthroplasty without evidence of hardware complication. Electronically Signed   By: Davina Poke D.O.   On: 06/02/2020 07:57      Flora Lipps, MD  [facility] 06/02/2020

## 2020-06-02 NOTE — Progress Notes (Signed)
  Echocardiogram 2D Echocardiogram has been performed.  [name] 06/02/2020, 3:34 PM

## 2020-06-02 NOTE — Consult Note (Signed)
Cardiology Consultation:   Patient ID: AVERLY ERICSON MRN: [id number]; DOB: 1934/06/08  Admit date: 05/30/2020 Date of Consult: 06/02/2020  Primary Care Provider: Marton Redwood, MD [facility] Cardiologist: Evalina Field, MD  [place] Electrophysiologist:  None    Patient Profile:   [name] is a 86 y.o. 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 Louanne Belton.  History of Present Illness:   [name] follows with Dr. Audie Box for the above cardiac issues. She has a long standing history of afib on Eliquis with CHADSVASC of 4. Coreg for rate control. Earlier this year she was seen for sob x 1 year. CT chest showed cardiomegaly with biatrial enlargement. BNP was found to be elevated and echo was ordered. Echo showed preserved EF with moderately dilated atrium, severely elevated pulmonary artery pressure, mild MR, mod TR. She was started on daily lasix. She was last seen 02/22/20 by Dr. Audie Box and had some LLE and pt was recommended to decrease her salt intake, but overall stable. He had previously taken patient off HCTZ for history of hyponatremia.  The patient presented to the ED 05/30/20 for generalized weakness and nausea for the last 2 weeks. Appetite was low. No vomiting or abd pain. Her daughter also noted sob. No chest pain. Reported limiting fluid and salt intake. She was taking lasix daily. In the ED she was afebrile and slightly tachypneic on 2L O2. Sodium came back at 118, labs otherwise were unremarkable. Hs troponin negative x 2. BNP 402. CXR showed new bilateral perihilar interstitial and hazy airspace opacities concerning for pulmonary edema or atypical infection, unchanged chronic right  pleural effusion. COVID negative. UA showed possible UTI and she was started on abx. Sodium continue to be low despite lasix and fluid restriction. Cardiology was consulted for CHF.    Past Medical History:  Diagnosis Date  . Arthritis 1996  . Atrial fibrillation (HCC)    on coumadin  . Atrial tachycardia ([place])   . Cancer [facility]) 1988   breast cancer, R mastectomy, chemo  . Carcinoid bronchial adenoma of right lung ([place]) 01/05/2016   Right lower lobectomy  . Diverticulitis 1999  . Femur fracture ([place]) 03/2013   left, non weight bearing for 2 1/2 weeks  . GERD (gastroesophageal reflux disease)   . Hemorrhoids 1991  . HTN (hypertension)   . Lymphedema of upper extremity    right arm  . Osteoporosis   . PONV (postoperative nausea and vomiting)   . Urinary, incontinence, stress female   . Vitamin D deficiency disease     Past Surgical History:  Procedure Laterality Date  . APPENDECTOMY    . BREAST BIOPSY  05/04/1987   Right Dr Annamaria Boots  . BREAST IMPLANT REMOVAL Right 06/05/10  . BUNIONECTOMY  1988  . carcinoid Right    Right lower lobectomy 2017  . COLONOSCOPY W/ POLYPECTOMY    . EYE SURGERY  5/14 ; 6/14   Cataract Surgery  . JOINT REPLACEMENT     2002 left; 2009 right Hip, rt knee 2012  . KNEE ARTHROPLASTY Right 2012   [facility]  . LOBECTOMY Right 01/05/2016   Procedure: RIGHT LOWER LOBE LOBECTOMY;  Surgeon: Melrose Nakayama, MD;  Location: [facility]  OR;  Service: Thoracic;  Laterality: Right;  . MASTECTOMY PARTIAL / LUMPECTOMY W/ AXILLARY LYMPHADENECTOMY  04/05/1987   Right - Dr Annamaria Boots  . RESECTION OF MEDIASTINAL MASS N/A 01/05/2016   Procedure: RESECTION OF PLEURAL MASS, right;  Surgeon: Melrose Nakayama, MD;  Location: [place];  Service: Thoracic;  Laterality: N/A;  . TONSILLECTOMY    . TOTAL HIP ARTHROPLASTY    . TUBAL LIGATION Bilateral 1970  . VAGINAL HYSTERECTOMY  1977   [place]  . VIDEO ASSISTED THORACOSCOPY (VATS)/WEDGE RESECTION Right 01/05/2016   Procedure: RIGHT  VIDEO ASSISTED THORACOSCOPY (VATS)/WEDGE RESECTION;  Surgeon: Melrose Nakayama, MD;  Location: [place];  Service: Thoracic;  Laterality: Right;     Home Medications:  Prior to Admission medications   Medication Sig Start Date End Date Taking? Authorizing Provider  acetaminophen (TYLENOL) 500 MG tablet Take 2 tablets (1,000 mg total) by mouth every 6 (six) hours as needed for mild pain. 01/17/16  Yes Lars Pinks M, PA-C  amLODipine (NORVASC) 10 MG tablet Take 10 mg by mouth daily.   Yes [provider]  apixaban (ELIQUIS) 5 MG TABS tablet Take 5 mg by mouth 2 (two) times daily.   Yes [provider]  Calcium Carbonate-Vitamin D (CALTRATE 600+D) 600-400 MG-UNIT per tablet Take 1 tablet by mouth daily.   Yes [provider]  carboxymethylcellulose (REFRESH PLUS) 0.5 % SOLN Place 1 drop into both eyes 2 (two) times daily as needed (for dry eyes).   Yes [provider]  carvedilol (COREG) 6.25 MG tablet Take 1 tablet (6.25 mg total) by mouth 2 (two) times daily with a meal. 01/17/16  Yes Gold, Wilder Glade, PA-C  Ergocalciferol (VITAMIN D2) 400 units TABS Take 400 Units by mouth daily.   Yes [provider]  Estradiol (VAGIFEM) 10 MCG TABS vaginal tablet Place 1 tablet (10 mcg total) vaginally 2 (two) times a week. 07/23/19  Yes Regina Eck, CNM  furosemide (LASIX) 20 MG tablet Take 1 tablet (20 mg total) by mouth daily. 02/22/20 06/02/20 Yes O'Neal, Cassie Freer, MD  Multiple Vitamin (MULTIVITAMIN WITH MINERALS) TABS Take 1 tablet by mouth every evening.   Yes [provider]  Omega-3 Fatty Acids (FISH OIL) 1000 MG CAPS Take 1 capsule by mouth 2 (two) times daily.   Yes [provider]  pantoprazole (PROTONIX) 40 MG tablet Take 40 mg by mouth daily. 05/18/20  Yes [provider]  rosuvastatin (CRESTOR) 10 MG tablet Take 10 mg by mouth at bedtime. 12/31/19  Yes [provider]  telmisartan (MICARDIS) 80 MG tablet Take  80 mg by mouth daily.   Yes [provider]  UNABLE TO FIND Place 2 drops into both eyes daily. Macular eye drops   Yes [provider]    Inpatient Medications: Scheduled Meds: . furosemide  40 mg Intravenous Q8H  . scopolamine  1 patch Transdermal Q72H  . sodium chloride flush  3 mL Intravenous Q12H   Continuous Infusions: . sodium chloride    . cefTRIAXone (ROCEPHIN)  IV     PRN Meds: sodium chloride, acetaminophen, prochlorperazine, sodium chloride flush  Allergies:    Allergies  Allergen Reactions  . Aspirin Rash    Social History:   Social History   Socioeconomic History  . Marital status: Widowed    Spouse name: Not on file  . Number of children: Not on file  . Years of education: Not on file  . Highest education level: Not on file  Occupational History  . Not on file  Tobacco Use  . Smoking status: Never Smoker  . Smokeless tobacco: Never Used  Vaping Use  . Vaping Use: Never used  Substance and Sexual Activity  . Alcohol use: No    Alcohol/week: 0.0 standard drinks  . Drug use: No  . Sexual activity: Not Currently    Partners: Male    Birth control/protection: Surgical    Comment: hysterectomy  Other Topics Concern  . Not on file  Social History Narrative  . Not on file   Social Determinants of Health   Financial Resource Strain:   . Difficulty of Paying Living Expenses: Not on file  Food Insecurity:   . Worried About Charity fundraiser in the Last Year: Not on file  . Ran Out of Food in the Last Year: Not on file  Transportation Needs:   . Lack of Transportation (Medical): Not on file  . Lack of Transportation (Non-Medical): Not on file  Physical Activity:   . Days of Exercise per Week: Not on file  . Minutes of Exercise per Session: Not on file  Stress:   . Feeling of Stress : Not on file  Social Connections:   . Frequency of Communication with Friends and Family: Not on file  . Frequency of Social Gatherings with  Friends and Family: Not on file  . Attends Religious Services: Not on file  . Active Member of Clubs or Organizations: Not on file  . Attends Archivist Meetings: Not on file  . Marital Status: Not on file  Intimate Partner Violence:   . Fear of Current or Ex-Partner: Not on file  . Emotionally Abused: Not on file  . Physically Abused: Not on file  . Sexually Abused: Not on file    Family History:   Family History  Problem Relation Age of Onset  . Colon cancer Mother   . Colon cancer Father   . Heart disease Father      ROS:  Please see the history of present illness.  All other ROS reviewed and negative.     Physical Exam/Data:   Vitals:   06/01/20 2113 06/02/20 0048 06/02/20 0434 06/02/20 0442  BP: (!) 98/50 (!) 107/59  (!) 103/58  Pulse: 78 85  72  Resp: 20 19  18   Temp: 98.6 F (37 C) 98.9 F (37.2 C)  98.2 F (36.8 C)  TempSrc: Oral Oral  Oral  SpO2: 99% 100%  100%  Weight:   62.2 kg   Height:        Intake/Output Summary (Last 24 hours) at 06/02/2020 1245 Last data filed at 06/02/2020 0800 Gross per 24 hour  Intake 378.54 ml  Output 750 ml  Net -371.46 ml   Last 3 Weights 06/02/2020 06/01/2020 03/08/2020  Weight (lbs) 137 lb 2 oz 141 lb 1.5 oz 133 lb  Weight (kg) 62.2 kg 64 kg 60.328 kg     Body mass index is 27.7 kg/m.  General:  Well nourished, well developed, in no acute distress HEENT: normal Lymph: no adenopathy Neck: no JVD Endocrine:  No thryomegaly Vascular: No carotid bruits; FA pulses 2+ bilaterally without bruits  Cardiac:  normal S1, S2; RRR; no murmur  Lungs:  Mild crackles at bases, wheezing Abd: soft, nontender, no hepatomegaly  Ext: no edema Musculoskeletal:  No deformities, BUE and BLE strength normal and equal Skin: warm and dry  Neuro:  CNs 2-12 intact, no focal abnormalities noted Psych:  Normal  affect   EKG:  The EKG was personally reviewed and demonstrates:  Afib 77 bpm Telemetry:  Telemetry was personally reviewed  and demonstrates:  Afib HR 70-80  Relevant CV Studies:  Echo 02/03/20  1. Left ventricular ejection fraction, by estimation, is 60 to 65%. The  left ventricle has normal function. The left ventricle has no regional  wall motion abnormalities. Left ventricular diastolic function could not  be evaluated.  2. Right ventricular systolic function is normal. The right ventricular  size is normal. There is severely elevated pulmonary artery systolic  pressure. The estimated right ventricular systolic pressure is 90.2 mmHg.  3. Left atrial size was moderately dilated.  4. Right atrial size was severely dilated.  5. The mitral valve is degenerative. Mild mitral valve regurgitation.  6. Tricuspid valve regurgitation is moderate.  7. The aortic valve is tricuspid. Aortic valve regurgitation is trivial.  Mild aortic valve sclerosis is present, with no evidence of aortic valve  stenosis.  8. The inferior vena cava is dilated in size with <50% respiratory  variability, suggesting right atrial pressure of 15 mmHg.    Laboratory Data:  High Sensitivity Troponin:   Recent Labs  Lab 05/30/20 1249 05/30/20 1631  TROPONINIHS 7 7     Chemistry Recent Labs  Lab 06/01/20 1649 06/02/20 0117 06/02/20 0925  NA 116* 119* 119*  K 4.4 5.1 4.8  CL 80* 81* 85*  CO2 26 27 25   GLUCOSE 112* 129* 124*  BUN 13 13 15   CREATININE 0.64 0.94 0.99  CALCIUM 8.2* 8.4* 8.2*  GFRNONAA >60 56* 53*  GFRAA >60 >60 >60  ANIONGAP 10 11 9     Recent Labs  Lab 06/02/20 0117  PROT 5.9*  ALBUMIN 2.9*  AST 32  ALT 17  ALKPHOS 67  BILITOT 1.7*   Hematology Recent Labs  Lab 05/30/20 1249 06/02/20 0925  WBC 7.4  --   RBC 3.66* 3.66*  HGB 11.3*  --   HCT 34.3*  --   MCV 93.7  --   MCH 30.9  --   MCHC 32.9  --   RDW 14.1  --   PLT 196  --    BNP Recent Labs  Lab 05/30/20 1249 06/01/20 0958  BNP 402.7* 299.4*    DDimer No results for input(s): DDIMER in the last 168  hours.   Radiology/Studies:  DG Knee 1-2 Views Left  Result Date: 06/02/2020 CLINICAL DATA:  Lower extremity swelling EXAM: LEFT KNEE - 1-2 VIEW COMPARISON:  None. FINDINGS: No acute fracture or dislocation. Severe osteoarthritis of the left knee most pronounced within the lateral and patellofemoral compartments. Moderate-sized knee joint effusion. Mineralized loose body posterior to the knee. Chondrocalcinosis. Mild soft tissue edema. IMPRESSION: 1. No acute fracture or dislocation. 2. Severe osteoarthritis of the left knee with moderate-sized knee joint effusion. Electronically Signed   By: Davina Poke D.O.   On: 06/02/2020 07:58   DG Chest Portable 1 View  Result Date: 05/30/2020 CLINICAL DATA:  Cough and shortness of breath for the past week. History of lung cancer. EXAM: PORTABLE CHEST 1 VIEW COMPARISON:  CT chest dated Mar 03, 2020. Chest x-ray dated July 17, 2016. FINDINGS: Stable cardiomegaly. New bilateral perihilar interstitial hazy airspace opacities. Postsurgical changes in the right lung. Unchanged chronic small right pleural effusion. No pneumothorax. No acute osseous abnormality. IMPRESSION: 1. New bilateral perihilar interstitial and hazy airspace opacities concerning for pulmonary edema or atypical infection. 2. Unchanged chronic small right pleural effusion. Electronically Signed  By: Titus Dubin M.D.   On: 05/30/2020 13:06   DG HIP UNILAT WITH PELVIS 2-3 VIEWS LEFT  Result Date: 06/02/2020 CLINICAL DATA:  Lower extremity edema EXAM: DG HIP (WITH OR WITHOUT PELVIS) 2-3V LEFT COMPARISON:  04/18/2013 FINDINGS: Status post left total hip arthroplasty. Arthroplasty components are in their expected alignment without evidence of periprosthetic lucency or fracture. Partially visualized right total hip arthroplasty, grossly intact. Bones are diffusely demineralized. Partially visualized lumbar spondylosis. IMPRESSION: Status post left total hip arthroplasty without evidence of  hardware complication. Electronically Signed   By: Davina Poke D.O.   On: 06/02/2020 07:57    Assessment and Plan:   Acute on chronic respiratory failure in the setting of acute on chronic CHF and known pulmonary HTN - Pt requiring 2L O2 in the ER. BNP 402 and CXR showed possible pulmonary edema vs atypical infection, unchanged Rt pleural effusion - started on IV lasix 4 mg Q8H - coreg continue. Has had some intermittent soft pressures - patient has been off thiazide for months given history of hyponatremia - Echo in April showed preserved EF with mild pulmonary hypertension, which likely contributing to sob - Net output -888mL however suspect this might be incomplete - BNP down to 299 - weight down 141lbs>137lbs - On exam pt with mild crackles but lower legs look euvolemic. Sob improving with diuresis  Hyponatremia - continues to be low despite IV lasix and fluid restriction - likely contributing to nausea - will check TSH - further work-up per IM. Can consider nephrology consult.   Permanent Afib - rate controlled on coreg - continue Eliquis  UTI - abx per IM    For questions or updates, please contact [place] Please consult [email or web address] for contact info under    Signed, [place] Ninfa Meeker, PA-C  06/02/2020 12:45 PM

## 2020-06-02 NOTE — Progress Notes (Signed)
Pt being transported off unit for echo at this time.

## 2020-06-02 NOTE — Progress Notes (Signed)
Notified MD of critical NA 119.

## 2020-06-02 NOTE — Evaluation (Signed)
Physical Therapy Evaluation Patient Details Name: [name] MRN: [id number] DOB: July 20, 1934 Today's Date: 06/02/2020   History of Present Illness  [name] is a 86 y.o. female with medical history significant of chronic diastolic CHF, permanent A. 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   Clinical Impression  Pt admitted with above diagnosis. Pt was able to ambulate with RW to door and back to bed with min guard assist. Pt on Lasix so limitied distance.  Feel that pt is weak and would benefit from short term Rehab prior to d/c home. Will follow acutely.  Pt currently with functional limitations due to the deficits listed below (see PT Problem List). Pt will benefit from skilled PT to increase their independence and safety with mobility to allow discharge to the venue listed below.      Follow Up Recommendations SNF;Supervision/Assistance - 24 hour    Equipment Recommendations  Rolling walker with 5" wheels    Recommendations for Other Services       Precautions / Restrictions Precautions Precautions: Fall Restrictions Other Position/Activity Restrictions: L knee pain at rest from fall 2 weeks ago, x-ray clear      Mobility  Bed Mobility Overal bed mobility: Needs Assistance Bed Mobility: Supine to Sit;Sit to Supine     Supine to sit: Min assist Sit to supine: Min assist   General bed mobility comments: steadying assist and light assist to advance LLE on and off bed  Transfers Overall transfer level: Needs assistance Equipment used: Rolling walker (2 wheeled);1 person hand held assist Transfers: Sit to/from  [facility] Sit to Stand: Min assist Stand pivot transfers: Min assist;Min guard       General transfer comment: Min assist to pivot to 3N1 and urinated while there.  Pivoted to 3N1 without RW and needing min assist. Min guard pivot with RW.  Placed Depends on for the walk so that pt will not have incontinence.   Ambulation/Gait Ambulation/Gait assistance: Min guard Gait Distance (Feet): 35 Feet Assistive device: Rolling walker (2 wheeled) Gait Pattern/deviations: Step-through pattern;Decreased stride length;Trunk flexed   Gait velocity interpretation: <1.31 ft/sec, indicative of household ambulator General Gait Details: Pt able to walk to door and back with RW wtih fair safety.  Fatigues quickly. Pt did not wet the Depends in this short distance.  Removed the Depends and called nurse to come and address purewick once pt was back in bed.   Stairs            Wheelchair Mobility    Modified Rankin (Stroke Patients Only)       Balance Overall balance assessment: Needs assistance;History of Falls Sitting-balance support: Bilateral upper extremity supported;Feet supported Sitting balance-[name]: Fair     Standing balance support: Bilateral upper extremity supported;During functional activity Standing balance-[name]: Poor Standing balance comment: Needs UE support on RW>                              Pertinent Vitals/Pain Pain Assessment: Faces Faces Pain Scale: Hurts even more Pain Location: L knee, right shoulder Pain Descriptors / Indicators: Sore;Aching Pain Intervention(s): Limited activity within patient's tolerance;Monitored during session;Repositioned  Home Living Family/patient expects to be discharged to:: Private residence Living Arrangements: Alone   Type of Home: Independent living facility Home Access: Level entry     Home Layout: One level Home Equipment: Clinical cytogeneticist - standard;Grab bars -  tub/shower Additional Comments: family could stay stay a couple    Prior Function Level of Independence: Independent               Hand Dominance   Dominant Hand: Right    Extremity/Trunk Assessment   Upper Extremity Assessment Upper Extremity Assessment: Defer to OT evaluation    Lower Extremity Assessment Lower Extremity Assessment: Generalized weakness    Cervical / Trunk Assessment Cervical / Trunk Assessment: Normal  Communication   Communication: No difficulties  Cognition Arousal/Alertness: Awake/alert Behavior During Therapy: Flat affect Overall Cognitive Status: Within Functional Limits for tasks assessed                                        General Comments General comments (skin integrity, edema, etc.): Removed O2 and desat to 87% therefore repleaced O2 at 2L.  >90% with 2 L in place.     Exercises     Assessment/Plan    PT Assessment Patient needs continued PT services  PT Problem List Decreased activity tolerance;Decreased balance;Decreased mobility;Decreased knowledge of use of DME;Decreased safety awareness;Decreased knowledge of precautions;Cardiopulmonary status limiting activity       PT Treatment Interventions DME instruction;Gait training;Functional mobility training;Therapeutic activities;Therapeutic exercise;Balance training;Patient/family education    PT Goals (Current goals can be found in the Care Plan section)  Acute Rehab PT Goals Patient Stated Goal: regain independence and strength PT Goal Formulation: With patient Time For Goal Achievement: 06/16/20 Potential to Achieve Goals: Good    Frequency Min 3X/week   Barriers to discharge        Co-evaluation               AM-PAC PT "6 Clicks" Mobility  Outcome Measure Help needed turning from your back to your side while in a flat bed without using bedrails?: A Little Help needed moving from lying on your back to sitting on the side of a flat bed  without using bedrails?: A Little Help needed moving to and from a bed to a chair (including a wheelchair)?: A Little Help needed standing up from a chair using your arms (e.g., wheelchair or bedside chair)?: A Little Help needed to walk in hospital room?: A Little Help needed climbing 3-5 steps with a railing? : A Little 6 Click Score: 18    End of Session Equipment Utilized During Treatment: Gait belt;Oxygen Activity Tolerance: Patient limited by fatigue Patient left: with call bell/phone within reach;in bed;with bed alarm set Nurse Communication: Mobility status PT Visit Diagnosis: Unsteadiness on feet (R26.81);Muscle weakness (generalized) (M62.81)    Time: 8099-8338 PT Time Calculation (min) (ACUTE ONLY): 28 min   Charges:   PT Evaluation $PT Eval Moderate Complexity: 1 Mod PT Treatments $Gait Training: 8-22 mins        Thayer Inabinet W,PT Acute Rehabilitation Services Pager:  [phone]  Office:  [place] 06/02/2020, 1:57 PM

## 2020-06-02 NOTE — Progress Notes (Signed)
Clarified order with Dr Royce Macadamia. No fluid restrictions at this time.

## 2020-06-02 NOTE — Evaluation (Signed)
Occupational Therapy Evaluation Patient Details Name: [name] MRN: [id number] DOB: Sep 03, 1934 Today's Date: 06/02/2020    History of Present Illness [name] is a 86 y.o. female with medical history significant of chronic diastolic CHF, permanent A. 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    Clinical Impression   Pt admitted with the above diagnoses and presents with below problem list. Pt will benefit from continued acute OT to address the below listed deficits and maximize independence with basic ADLs prior to d/c to venue below. At baseline, pt is independent with ADLs. Pt is currently mod A with functional transfers and UB/LB ADLs. Session limited by constant urinary incontinence (currently on Lasix). Pt completed SPT to access [facility] this session.     Follow Up Recommendations  Supervision/Assistance - 24 hour;SNF; pt reports she is at a [place] currently in [place] but able to go to ALF/SNF area if needed at d/c. [facility].     Equipment Recommendations  Other (comment) (defer to next venue)    Recommendations for Other Services PT consult     Precautions / Restrictions Precautions Precautions: Fall Restrictions Weight Bearing Restrictions: No Other Position/Activity Restrictions: L knee pain at rest from fall 2 weeks ago, x-ray clear      Mobility Bed Mobility Overal bed mobility: Needs Assistance Bed Mobility: Supine to Sit;Sit to Supine     Supine to sit: Min assist Sit to supine: Min assist   General bed mobility comments: steadying assist and light assist to advance LLE on and off bed  Transfers Overall  transfer level: Needs assistance Equipment used: 1 person hand held assist Transfers: Sit to/from [facility] Sit to Stand: Mod assist Stand pivot transfers: Mod assist       General transfer comment: mod A to steady. Pt seeking BUE support. May benefit for rw for now.     Balance Overall balance assessment: Needs assistance;History of Falls Sitting-balance support: Bilateral upper extremity supported;Feet supported Sitting balance-[name]: Fair Sitting balance - Comments: completed anterior pericare in sitting/lateral leans   Standing balance support: Bilateral upper extremity supported;During functional activity Standing balance-[name]: Poor Standing balance comment: seeking external support. likely would benefit from rw for now                           ADL either performed or assessed with clinical judgement   ADL Overall ADL's : Needs assistance/impaired Eating/Feeding: Set up;Sitting   Grooming: Sitting;Minimal assistance Grooming Details (indicate cue type and reason): min A in seated position and/or full grooming session Upper Body Bathing: Sitting;Moderate assistance   Lower Body Bathing: Moderate assistance;Sit to/from stand   Upper Body Dressing : Moderate assistance;Sitting   Lower Body Dressing: Moderate assistance;Sit to/from stand   Toilet Transfer: Moderate assistance;Stand-pivot;BSC Toilet Transfer Details (indicate cue type and reason): +1 HHA and external support of rails Toileting- Clothing Manipulation and Hygiene: Moderate assistance;Sit to/from stand         General ADL Comments: Pt completed bed mobility, SPT, pericare, sat up on BSC at supervision level for about 10 minutes then back to bed. Constant urinary incontinence (on Lasix) a limiting factor  Vision Patient Visual Report: No change from baseline Additional Comments: does not wear eyeglasses since cataract surgery     Perception     Praxis       Pertinent Vitals/Pain Pain Assessment: Faces Faces Pain Scale: Hurts even more Pain Location: L knee Pain Descriptors / Indicators: Sore;Aching Pain Intervention(s): Limited activity within patient's tolerance;Monitored during session;Repositioned     Hand Dominance Right   Extremity/Trunk Assessment Upper Extremity Assessment Upper Extremity Assessment: Generalized weakness   Lower Extremity Assessment Lower Extremity Assessment: Defer to PT evaluation       Communication Communication Communication: No difficulties   Cognition Arousal/Alertness: Awake/alert Behavior During Therapy: Flat affect Overall Cognitive Status: Within Functional Limits for tasks assessed                                     General Comments  On 2L throughout session.    Exercises     Shoulder Instructions      Home Living Family/patient expects to be discharged to:: Private residence Living Arrangements: Alone   Type of Home: Independent living facility Home Access: Level entry     Home Layout: One level     Bathroom Shower/Tub: Occupational psychologist: Handicapped height     Home Equipment: Clinical cytogeneticist - standard;Grab bars - tub/shower   Additional Comments: family could stay stay a couple      Prior Functioning/Environment Level of Independence: Independent                 OT Problem List: Impaired balance (sitting and/or standing);Decreased strength;Decreased activity tolerance;Decreased knowledge of use of DME or AE;Decreased knowledge of precautions;Pain      OT Treatment/Interventions: Self-care/ADL training;Therapeutic exercise;Energy conservation;DME and/or AE instruction;Therapeutic activities;Patient/family education;Balance training    OT Goals(Current goals can be found in the care plan section) Acute Rehab OT Goals Patient Stated Goal: regain independence and strength OT Goal Formulation: With patient/family Time For Goal  Achievement: 06/16/20 Potential to Achieve Goals: Good ADL Goals Pt Will Perform Grooming: standing;with min guard assist Pt Will Perform Upper Body Bathing: with set-up;sitting Pt Will Perform Lower Body Bathing: with min guard assist;sit to/from stand Pt Will Transfer to Toilet: with min guard assist;ambulating Pt Will Perform Toileting - Clothing Manipulation and hygiene: with min guard assist;sit to/from stand Additional ADL Goal #1: Pt will complete bed mobility at supervision level to prepare for OOB ADLs.  OT Frequency: Min 2X/week   Barriers to D/C:            Co-evaluation              AM-PAC OT "6 Clicks" Daily Activity     Outcome Measure Help from another person eating meals?: None Help from another person taking care of personal grooming?: A Little Help from another person toileting, which includes using toliet, bedpan, or urinal?: A Lot Help from another person bathing (including washing, rinsing, drying)?: A Lot Help from another person to put on and taking off regular upper body clothing?: A Little Help from another person to put on and taking off regular lower body clothing?: A Lot 6 Click Score: 16   End of Session Equipment Utilized During Treatment: Oxygen Nurse Communication: Mobility status  Activity Tolerance: Patient limited by fatigue Patient left: in bed;with call bell/phone within reach;with family/visitor present;with bed alarm set  OT Visit Diagnosis: Unsteadiness on feet (R26.81);Pain;Muscle weakness (generalized) (M62.81);History of  falling (Z91.81) Pain - Right/Left: Left Pain - part of body: Knee                Time: 4461-9012 OT Time Calculation (min): 52 min Charges:  OT General Charges $OT Visit: 1 Visit OT Evaluation $OT Eval Low Complexity: 1 Low OT Treatments $Self Care/Home Management : 23-37 mins  Tyrone Schimke, OT Acute Rehabilitation Services Pager: [phone] Office: [phone]   Hortencia Pilar 06/02/2020,  11:27 AM

## 2020-06-02 NOTE — Plan of Care (Signed)
  Problem: Education: Goal: Ability to demonstrate management of disease process will improve Outcome: Progressing   Problem: Education: Goal: Knowledge of General Education information will improve Description: Including pain rating scale, medication(s)/side effects and non-pharmacologic comfort measures Outcome: Progressing   Problem: Cardiac: Goal: Ability to achieve and maintain adequate cardiopulmonary perfusion will improve Outcome: Progressing   Problem: Clinical Measurements: Goal: Respiratory complications will improve Outcome: Progressing

## 2020-06-03 LAB — CBC
HCT: 32.6 % — ABNORMAL LOW (ref 36.0–46.0)
Hemoglobin: 10.7 g/dL — ABNORMAL LOW (ref 12.0–15.0)
MCH: 30.7 pg (ref 26.0–34.0)
MCHC: 32.8 g/dL (ref 30.0–36.0)
MCV: 93.7 fL (ref 80.0–100.0)
Platelets: 189 10*3/uL (ref 150–400)
RBC: 3.48 MIL/uL — ABNORMAL LOW (ref 3.87–5.11)
RDW: 14.4 % (ref 11.5–15.5)
WBC: 8.2 10*3/uL (ref 4.0–10.5)
nRBC: 0 % (ref 0.0–0.2)

## 2020-06-03 LAB — URINE CULTURE: Culture: >=100000 — AB

## 2020-06-03 LAB — COMPREHENSIVE METABOLIC PANEL
ALT: 17 U/L (ref 0–44)
AST: 23 U/L (ref 15–41)
Albumin: 2.5 g/dL — ABNORMAL LOW (ref 3.5–5.0)
Alkaline Phosphatase: 59 U/L (ref 38–126)
Anion gap: 7 (ref 5–15)
BUN: 22 mg/dL (ref 8–23)
CO2: 27 mmol/L (ref 22–32)
Calcium: 8.2 mg/dL — ABNORMAL LOW (ref 8.9–10.3)
Chloride: 84 mmol/L — ABNORMAL LOW (ref 98–111)
Creatinine, Ser: 1.14 mg/dL — ABNORMAL HIGH (ref 0.44–1.00)
GFR calc Af Amer: 51 mL/min — ABNORMAL LOW (ref >60)
GFR calc non Af Amer: 44 mL/min — ABNORMAL LOW (ref >60)
Glucose, Bld: 119 mg/dL — ABNORMAL HIGH (ref 70–99)
Potassium: 4.5 mmol/L (ref 3.5–5.1)
Sodium: 118 mmol/L — CL (ref 135–145)
Total Bilirubin: 0.9 mg/dL (ref 0.3–1.2)
Total Protein: 5.3 g/dL — ABNORMAL LOW (ref 6.5–8.1)

## 2020-06-03 LAB — MAGNESIUM: Magnesium: 2 mg/dL (ref 1.7–2.4)

## 2020-06-03 LAB — BRAIN NATRIURETIC PEPTIDE: B Natriuretic Peptide: 282.4 pg/mL — ABNORMAL HIGH (ref 0.0–100.0)

## 2020-06-03 LAB — BASIC METABOLIC PANEL
Anion gap: 9 (ref 5–15)
BUN: 26 mg/dL — ABNORMAL HIGH (ref 8–23)
CO2: 27 mmol/L (ref 22–32)
Calcium: 8.3 mg/dL — ABNORMAL LOW (ref 8.9–10.3)
Chloride: 81 mmol/L — ABNORMAL LOW (ref 98–111)
Creatinine, Ser: 1.24 mg/dL — ABNORMAL HIGH (ref 0.44–1.00)
GFR calc Af Amer: 47 mL/min — ABNORMAL LOW (ref >60)
GFR calc non Af Amer: 40 mL/min — ABNORMAL LOW (ref >60)
Glucose, Bld: 157 mg/dL — ABNORMAL HIGH (ref 70–99)
Potassium: 4.5 mmol/L (ref 3.5–5.1)
Sodium: 117 mmol/L — CL (ref 135–145)

## 2020-06-03 LAB — PHOSPHORUS: Phosphorus: 3.5 mg/dL (ref 2.5–4.6)

## 2020-06-03 LAB — SODIUM: Sodium: 120 mmol/L — ABNORMAL LOW (ref 135–145)

## 2020-06-03 LAB — CORTISOL-AM, BLOOD: Cortisol - AM: 14.9 ug/dL (ref 6.7–22.6)

## 2020-06-03 MED ORDER — UREA 15 G PO PACK
15.0000 g | PACK | Freq: Two times a day (BID) | ORAL | Status: DC
  Administered 2020-06-03 – 2020-06-04 (×3): 15 g via ORAL
  Filled 2020-06-03 (×5): qty 1

## 2020-06-03 MED ORDER — TOLVAPTAN 15 MG PO TABS
15.0000 mg | ORAL_TABLET | Freq: Once | ORAL | Status: AC
  Administered 2020-06-03: 15 mg via ORAL
  Filled 2020-06-03: qty 1

## 2020-06-03 MED ORDER — NON FORMULARY: 15.0000 g | Freq: Two times a day (BID) | Status: DC

## 2020-06-03 NOTE — NC FL2 (Signed)
[place] LEVEL OF CARE SCREENING TOOL     IDENTIFICATION  Patient Name: [name] Birthdate: 03-Jan-1934 Sex: female Admission Date (Current Location): 05/30/2020  [facility] and [place] Number:      Facility and Address:  [facility]. [facility], [place] [address], [place], [place] [address]      Provider Number: [id number]  Attending Physician Name and Address:  Flora Lipps, MD  Relative Name and Phone Number:       Current Level of Care: Hospital Recommended Level of Care: [place] Prior Approval Number:    Date Approved/Denied:   PASRR Number: [id number]  Discharge Plan:      Current Diagnoses: Patient Active Problem List   Diagnosis Date Noted  . Acute hypoxemic respiratory failure ([place]) 06/02/2020  . Acute heart failure ([place]) 06/01/2020  . Acute exacerbation of congestive heart failure ([place]) 05/30/2020  . Malignant neoplasm of upper-outer quadrant of right female breast ([place]) 07/09/2016  . Abdominal pain 06/02/2016  . Nausea 06/02/2016  . 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 Sinus pause 01/17/2016  . Essential hypertension   . Carcinoid bronchial adenoma of right lung ([place]) 01/05/2016  . Long term current use of anticoagulant therapy 05/09/2013  . Osteoporosis 05/09/2013  . Constipation 05/08/2013  . Hip fracture, left ([place]) 04/19/2013  . Hyponatremia 04/19/2013  . Atrial fibrillation, chronic ([place]) 04/19/2013  . Lung nodule 04/19/2013  . Cellulitis of arm, right 06/05/2011  . OSTEOARTHRITIS 01/13/2007  . OSTEOPENIA 01/13/2007    Orientation RESPIRATION BLADDER Height & Weight     Self, Time, Situation, Place  O2 (2L [place]) Continent Weight: 138 lb 3.7 oz (62.7 kg) Height:  4\' 11"  (149.9 cm)  BEHAVIORAL SYMPTOMS/MOOD NEUROLOGICAL BOWEL NUTRITION STATUS      Continent    AMBULATORY STATUS COMMUNICATION OF NEEDS Skin   Limited Assist Verbally Normal                        Personal Care Assistance Level of Assistance  Bathing, Dressing Bathing Assistance: Limited assistance   Dressing Assistance: Limited assistance     Functional Limitations Info  Hearing   Hearing Info: Impaired      SPECIAL CARE FACTORS FREQUENCY  PT (By licensed PT), OT (By licensed OT)                    Contractures Contractures Info: Not present    Additional Factors Info  Code Status Code Status Info: DNR             Current Medications (06/03/2020):  This is the current hospital active medication list Current Facility-Administered Medications  Medication Dose Route Frequency Provider Last Rate Last Admin  . 0.9 %  sodium chloride infusion  250 mL Intravenous PRN Shela Leff, MD 10 mL/hr at 06/03/20 0658 250 mL at 06/03/20 0658  . acetaminophen (TYLENOL) tablet 650 mg  650 mg Oral Q4H PRN Shela Leff, MD   650 mg at 06/02/20 2302  . apixaban (ELIQUIS) tablet 2.5 mg  2.5 mg Oral BID Pokhrel, Laxman, MD   2.5 mg at 06/03/20 0825  . calcium-vitamin D (OSCAL WITH D) 500-200 MG-UNIT per tablet 1 tablet  1 tablet Oral Daily Pokhrel, Laxman, MD   1 tablet at 06/03/20 0827  . carvedilol (COREG) tablet 6.25 mg  6.25 mg Oral BID WC Pokhrel, Laxman, MD   6.25 mg at 06/03/20 0710  . cefTRIAXone (ROCEPHIN) 1 g  in sodium chloride 0.9 % 100 mL IVPB  1 g Intravenous Q24H Shela Leff, MD 200 mL/hr at 06/03/20 0709 1 g at 06/03/20 0709  . cholecalciferol (VITAMIN D3) tablet 400 Units  400 Units Oral Daily Donnamae Jude, RPH   400 Units at 06/02/20 1612  . multivitamin with minerals tablet 1 tablet  1 tablet Oral QPM Pokhrel, Laxman, MD   1 tablet at 06/02/20 1612  . pantoprazole (PROTONIX) EC tablet 40 mg  40 mg Oral Daily Pokhrel, Laxman, MD   40 mg at 06/03/20 0827  . polyvinyl alcohol (LIQUIFILM TEARS) 1.4 % ophthalmic solution 1 drop  1 drop Both Eyes PRN Blenda Nicely, RPH      . prochlorperazine (COMPAZINE) injection 5 mg  5 mg Intravenous Q6H PRN  Shela Leff, MD      . scopolamine (TRANSDERM-SCOP) 1 MG/3DAYS 1.5 mg  1 patch Transdermal Q72H Shela Leff, MD   1.5 mg at 06/02/20 0325  . sodium chloride flush (NS) 0.9 % injection 3 mL  3 mL Intravenous Q12H Shela Leff, MD   3 mL at 06/02/20 2054  . sodium chloride flush (NS) 0.9 % injection 3 mL  3 mL Intravenous PRN Shela Leff, MD      . tolvaptan (SAMSCA) tablet 15 mg  15 mg Oral Once Reesa Chew, MD         Discharge Medications: Please see discharge summary for a list of discharge medications.  Relevant Imaging Results:  Relevant Lab Results:   Additional Information SS# [id number]  Amador Cunas, [place]

## 2020-06-03 NOTE — TOC Initial Note (Signed)
Transition of Care [facility]) - Initial/Assessment Note    Patient Details  Name: [name] MRN: [id number] Date of Birth: 1934/01/27  [facility]) CM/SW Contact:    Amador Cunas, [place] Phone Number: 06/03/2020, 9:54 AM  Clinical Narrative:  Spoke with pt and pt's dtr re PT recommendation for SNF. Pt admitted from [facility] at [facility] and is agreeable to SNF for STR at [facility] prior to returning to [place]. Referral made to [facility] and voicemail left for [place] in admissions. [place] auth submitted. SW will provide updates as available.   Wandra Feinstein, MSW, LCSW [phone] (coverage)           Expected Discharge Plan: [place] Barriers to Discharge: Continued Medical Work up, Ship broker   Patient Goals and CMS Choice   CMS Medicare.gov Compare Post Acute Care list provided to:: Patient Choice offered to / list presented to : Patient  Expected Discharge Plan and Services Expected Discharge Plan: [place] Choice: [place] Living arrangements for the past 2 months: [place]                                      Prior Living Arrangements/Services Living arrangements for the past 2 months: [place] Lives with:: Self Patient language and need for interpreter reviewed:: No        Need for Family Participation in Patient Care: Yes (Comment) Care giver support system in place?: Yes (comment)   Criminal Activity/Legal Involvement Pertinent to Current Situation/Hospitalization: No - Comment as needed  Activities of Daily Living      Permission Sought/Granted Permission sought to share information with : Chartered certified accountant granted to share information with : Yes, Verbal Permission Granted              Emotional Assessment       Orientation: : Oriented to Place, Oriented to   Time, Oriented to Situation, Oriented to Self Alcohol / Substance Use: Not Applicable    Admission diagnosis:  Acute heart failure (HCC) [I50.9] Hyponatremia [E87.1] Acute exacerbation of congestive heart failure ([place]) [I50.9] Acute on chronic congestive heart failure, unspecified heart failure type [facility]) [I50.9] Patient Active Problem List   Diagnosis Date Noted  . Acute hypoxemic respiratory failure ([place]) 06/02/2020  . Acute heart failure ([place]) 06/01/2020  . Acute exacerbation of congestive heart failure ([place]) 05/30/2020  . Malignant neoplasm of upper-outer quadrant of right female breast ([place]) 07/09/2016  . Abdominal pain 06/02/2016  . Nausea 06/02/2016  . 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 Sinus pause 01/17/2016  . Essential hypertension   . Carcinoid bronchial adenoma of right lung ([place]) 01/05/2016  . Long term current use of anticoagulant therapy 05/09/2013  . Osteoporosis 05/09/2013  . Constipation 05/08/2013  . Hip fracture, left ([place]) 04/19/2013  . Hyponatremia 04/19/2013  . Atrial fibrillation, chronic ([place]) 04/19/2013  . Lung nodule 04/19/2013  . Cellulitis of arm, right 06/05/2011  . OSTEOARTHRITIS 01/13/2007  . OSTEOPENIA 01/13/2007   PCP:  Marton Redwood, MD Pharmacy:   [id number] - [place], [place] [address] Phone: [phone] Fax: [phone]     Social Determinants of Health (SDOH) Interventions    Readmission Risk Interventions No flowsheet data found.

## 2020-06-03 NOTE — NC FL2 (Signed)
[place] LEVEL OF CARE SCREENING TOOL     IDENTIFICATION  Patient Name: [name] Birthdate: [birth year] Sex: female Admission Date (Current Location): 05/30/2020  [facility] and [place] Number:      Facility and Address:  [facility] [place]. [facility], [place] [address] [place], [place] [address]      Provider Number: [id number]  Attending Physician Name and Address:  Flora Lipps, MD  Relative Name and Phone Number:       Current Level of Care: Hospital Recommended Level of Care: [place] Prior Approval Number:    Date Approved/Denied:   PASRR Number: [id number]  Discharge Plan:      Current Diagnoses: Patient Active Problem List   Diagnosis Date Noted  . Acute hypoxemic respiratory failure ([place]) 06/02/2020  . Acute heart failure ([place]) 06/01/2020  . Acute exacerbation of congestive heart failure ([place]) 05/30/2020  . Malignant neoplasm of upper-outer quadrant of right female breast ([place]) 07/09/2016  . Abdominal pain 06/02/2016  . Nausea 06/02/2016  . 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 Sinus pause 01/17/2016  . Essential hypertension   . Carcinoid bronchial adenoma of right lung ([place]) 01/05/2016  . Long term current use of anticoagulant therapy 05/09/2013  . Osteoporosis 05/09/2013  . Constipation 05/08/2013  . Hip fracture, left ([place]) 04/19/2013  . Hyponatremia 04/19/2013  . Atrial fibrillation, chronic ([place]) 04/19/2013  . Lung nodule 04/19/2013  . Cellulitis of arm, right 06/05/2011  . OSTEOARTHRITIS 01/13/2007  . OSTEOPENIA 01/13/2007    Orientation RESPIRATION BLADDER Height & Weight     Self, Time, Situation, Place  O2 (2L [place]) Continent Weight: 138 lb 3.7 oz (62.7 kg) Height:  4\' 11"  (149.9 cm)  BEHAVIORAL SYMPTOMS/MOOD NEUROLOGICAL BOWEL NUTRITION STATUS      Continent    AMBULATORY STATUS COMMUNICATION OF NEEDS Skin   Limited Assist Verbally Normal                        Personal Care Assistance Level of Assistance  Bathing, Dressing Bathing Assistance: Limited assistance   Dressing Assistance: Limited assistance     Functional Limitations Info  Hearing   Hearing Info: Impaired      SPECIAL CARE FACTORS FREQUENCY  PT (By licensed PT), OT (By licensed OT)                    Contractures Contractures Info: Not present    Additional Factors Info  Code Status Code Status Info: DNR             Current Medications (06/03/2020):  This is the current hospital active medication list Current Facility-Administered Medications  Medication Dose Route Frequency Provider Last Rate Last Admin  . 0.9 %  sodium chloride infusion  250 mL Intravenous PRN Shela Leff, MD 10 mL/hr at 06/03/20 0658 250 mL at 06/03/20 0658  . acetaminophen (TYLENOL) tablet 650 mg  650 mg Oral Q4H PRN Shela Leff, MD   650 mg at 06/02/20 2302  . apixaban (ELIQUIS) tablet 2.5 mg  2.5 mg Oral BID Pokhrel, Laxman, MD   2.5 mg at 06/03/20 0825  . calcium-vitamin D (OSCAL WITH D) 500-200 MG-UNIT per tablet 1 tablet  1 tablet Oral Daily Pokhrel, Laxman, MD   1 tablet at 06/03/20 0827  . carvedilol (COREG) tablet 6.25 mg  6.25 mg Oral BID WC Pokhrel, Laxman, MD   6.25 mg at 06/03/20 0710  . cefTRIAXone (ROCEPHIN) 1 g  in sodium chloride 0.9 % 100 mL IVPB  1 g Intravenous Q24H Shela Leff, MD 200 mL/hr at 06/03/20 0709 1 g at 06/03/20 0709  . cholecalciferol (VITAMIN D3) tablet 400 Units  400 Units Oral Daily Donnamae Jude, RPH   400 Units at 06/02/20 1612  . multivitamin with minerals tablet 1 tablet  1 tablet Oral QPM Pokhrel, Laxman, MD   1 tablet at 06/02/20 1612  . pantoprazole (PROTONIX) EC tablet 40 mg  40 mg Oral Daily Pokhrel, Laxman, MD   40 mg at 06/03/20 0827  . polyvinyl alcohol (LIQUIFILM TEARS) 1.4 % ophthalmic solution 1 drop  1 drop Both Eyes PRN Blenda Nicely, RPH      . prochlorperazine (COMPAZINE) injection 5 mg  5 mg Intravenous Q6H PRN  Shela Leff, MD      . scopolamine (TRANSDERM-SCOP) 1 MG/3DAYS 1.5 mg  1 patch Transdermal Q72H Shela Leff, MD   1.5 mg at 06/02/20 0325  . sodium chloride flush (NS) 0.9 % injection 3 mL  3 mL Intravenous Q12H Shela Leff, MD   3 mL at 06/02/20 2054  . sodium chloride flush (NS) 0.9 % injection 3 mL  3 mL Intravenous PRN Shela Leff, MD      . tolvaptan (SAMSCA) tablet 15 mg  15 mg Oral Once Reesa Chew, MD         Discharge Medications: Please see discharge summary for a list of discharge medications.  Relevant Imaging Results:  Relevant Lab Results:   Additional Information SS# [id number]  Amador Cunas, [place]

## 2020-06-03 NOTE — Progress Notes (Addendum)
PROGRESS NOTE  [name]:[id number] DOB: August 24, 1934 DOA: 05/30/2020 PCP: Marton Redwood, MD   LOS: 2 days   Brief narrative: As per HPI,  [name] is a 86 y.o. female with medical history significant of chronic diastolic CHF, permanent A. 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  Patient reports having generalized weakness and nausea for the past few weeks.  She has been vaccinated against Covid.  States she fell 2 weeks ago while going to the bathroom as she did not switch on the light.  Since the fall she is having pain and swelling in her left knee.  In the ED,  Afebrile.  Slightly tachypneic.  On 2 L supplemental oxygen.  Chest x-ray showing new bilateral perihilar interstitial and hazy airspace opacities concerning for pulmonary edema or atypical infection.  Unchanged chronic small right pleural effusion.  SARS-CoV-2 PCR test negative. Sodium 114 >117>116.  Repeat UA was done and showed positive nitrite, moderate amount of leukocytes, 11-20 WBCs, and many bacteria.  CK normal.  Magnesium normal.  BNP 299.  Patient was then considered for admission to the hospital.  Assessment/Plan:  Principal Problem:   Acute exacerbation of congestive heart failure (HCC) Active Problems:   Hyponatremia   Nausea   Acute heart failure (HCC)   Acute hypoxemic respiratory failure (HCC)   Acute hypoxemic respiratory failure secondary to acute exacerbation of chronic diastolic CHF:   BNP was elevated.  Chest x-ray with findings concerning for pulmonary edema. on requiring 2 L supplemental oxygen. Echo done April 2021 showing normal LV systolic  function with EF of 60 to 65%, with severely elevated pulmonary artery systolic pressure.  Initially received Lasix which has been discontinued.  Currently euvolemic as per cardiology.  Continue daily weights, strict intake and output charting.  Severe acute symptomatic hyponatremia on chronic hyponatremia:  Labs from May 2021 showing sodium 133 and was low in the 120-130 range on previous labs from 2017 as well.    BMP closely.  Cardiology recommended nephrology evaluation.  Patient has been seen by nephrology.  Received 1 dose of tolvaptan yesterday at 7.5 mg..  Cortisol of 14.9.  Magnesium 2.0.  Phosphorus 3.5.  Urine osmolality 285 and serum osmolality 254.  Urinary sodium of 15.  TSH was 1.2.  Sodium is still 118 after tolvaptan x1 dose.  Follow nephrology recommendations.  Patient will be receiving a repeat and increased dose of tolvaptan today at 15 mg.  History of carcinoid of the lung resection in 2017.   Nausea: Likely related to severe hyponatremia.  Currently on scopolamine patch, Compazine as needed.    Lipase of 52.  Mild hypomagnesemia.  Improved.  Monitor magnesium level in a.m.  Generalized weakness/recent fall:  bruising to her left hip/lateral thigh region with left knee pain. CK normal. X-ray of left hip and knee reviewed with no acute fracture or dislocation but severe osteoarthritis of the left knee with total hip arthroplasty.   Continue fall precautions.  Physical therapy recommended skilled nursing facility placement on discharge.  Permanent A. fib: Currently rate controlled. on Coreg and Eliquis   Normocytic anemia monitor closely.  Check CBC in a.m.  Iron level is 25.  Elevated  vitamin B12 levels.  Occult blood pending.  UTI: On Rocephin.  E. coli and Enterococcus isolated.  Patient does not have much urinary symptoms.  Discontinue IV antibiotics today.  QT prolongation on EKG  Goal is to keep potassium above 4 and magnesium above 2.  Currently potassium of 4.5 and  magnesium of 2.0.  Avoid QT prolonging medication.  Telemetry monitor.   DVT prophylaxis: apixaban (ELIQUIS) tablet 2.5 mg Start: 06/02/20 1445  Code Status: DO NOT RESUSCITATE  Family Communication: Spoke with the daughter at bedside.  Status is: Inpatient  Remains inpatient appropriate because:Unsafe d/c plan, IV treatments appropriate due to intensity of illness or inability to take PO and Inpatient level of care appropriate due to severity of illness, hyponatremia,   Dispo: The patient is from: Home              Anticipated d/c is to: SNF              Anticipated d/c date is: 2 to 3 days.              Patient currently is not medically stable to d/c.  Consultants:  Cardiology  Procedures:  None  Antibiotics:  . Rocephin IV 8/18> 8/21  Anti-infectives (From admission, onward)   Start     Dose/Rate Route Frequency Ordered Stop   06/02/20 0600  cefTRIAXone (ROCEPHIN) 1 g in sodium chloride 0.9 % 100 mL IVPB        1 g 200 mL/hr over 30 Minutes Intravenous Every 24 hours 06/02/20 0039     06/01/20 0600  cefTRIAXone (ROCEPHIN) 1 g in sodium chloride 0.9 % 100 mL IVPB        1 g 200 mL/hr over 30 Minutes Intravenous  Once 06/01/20 0552 06/01/20 0644     Subjective: Today, patient complains of mild epigastric discomfort and nausea with weakness.  Denies fever, chills or rigor.  Objective: Vitals:   06/03/20 0700 06/03/20 0734  BP: (!) 91/45 (!) 106/56  Pulse:  65  Resp:  16  Temp:  98.3 F (36.8 C)  SpO2:  99%    Intake/Output Summary (Last 24 hours) at 06/03/2020 0840 Last data filed at 06/02/2020 2147 Gross per 24 hour  Intake 480 ml  Output 350 ml  Net 130 ml   Filed Weights   06/01/20 1859 06/02/20 0434 06/03/20 0340  Weight: 64 kg 62.2 kg 62.7 kg   Body mass index is 27.92 kg/m.   Physical Exam: GENERAL: Patient is alert, awake and communicative not in obvious distress. HENT: No scleral pallor or icterus. Pupils equally reactive to light. Oral  mucosa is moist NECK: is supple, no gross swelling noted. CHEST: Diminished breath sounds bilaterally.  Coarse breath sounds noted. CVS: S1 and S2 heard, no murmur.  Irregular rhythm. ABDOMEN: Soft, mildly distended, non-tender, bowel sounds are present. EXTREMITIES: Legs with bilateral lower extremity edema++.  Left hip with bruise.  Left knee swollen. CNS: Cranial nerves are intact. No focal motor deficits. SKIN: warm and dry without rashes.  Data Review: I have personally reviewed the following laboratory data and studies,  CBC: Recent Labs  Lab 05/30/20 1249 06/03/20 0132  WBC 7.4 8.2  NEUTROABS 6.5  --   HGB 11.3* 10.7*  HCT 34.3* 32.6*  MCV 93.7 93.7  PLT 196 035   Basic Metabolic Panel: Recent Labs  Lab 06/01/20 0958 06/01/20 0958 06/01/20 1649 06/02/20 0117 06/02/20 0925 06/02/20 1430 06/03/20 0132  NA 117*   < >  116* 119* 119* 122* 118*  K 4.0  --  4.4 5.1 4.8  --  4.5  CL 80*  --  80* 81* 85*  --  84*  CO2 26  --  26 27 25   --  27  GLUCOSE 109*  --  112* 129* 124*  --  119*  BUN 11  --  13 13 15   --  22  CREATININE 0.56  --  0.64 0.94 0.99  --  1.14*  CALCIUM 8.3*  --  8.2* 8.4* 8.2*  --  8.2*  MG 1.7  --   --  1.5*  --   --  2.0  PHOS  --   --   --   --   --   --  3.5   < > = values in this interval not displayed.   Liver Function Tests: Recent Labs  Lab 06/02/20 0117 06/03/20 0132  AST 32 23  ALT 17 17  ALKPHOS 67 59  BILITOT 1.7* 0.9  PROT 5.9* 5.3*  ALBUMIN 2.9* 2.5*   Recent Labs  Lab 06/02/20 0117  LIPASE 52*   No results for input(s): AMMONIA in the last 168 hours. Cardiac Enzymes: Recent Labs  Lab 06/01/20 0958  CKTOTAL 53   BNP (last 3 results) Recent Labs    01/21/20 1502 05/30/20 1249 06/01/20 0958  BNP 173.4* 402.7* 299.4*    ProBNP (last 3 results) No results for input(s): PROBNP in the last 8760 hours.  CBG: No results for input(s): GLUCAP in the last 168 hours. Recent Results (from the past 240 hour(s))  SARS  Coronavirus 2 by RT PCR (hospital order, performed in [facility] hospital lab) Nasopharyngeal Nasopharyngeal Swab     Status: None   Collection Time: 05/30/20  1:35 PM   Specimen: Nasopharyngeal Swab  Result Value Ref Range Status   SARS Coronavirus 2 NEGATIVE NEGATIVE Final    Comment: (NOTE) SARS-CoV-2 target nucleic acids are NOT DETECTED.  The SARS-CoV-2 RNA is generally detectable in upper and lower respiratory specimens during the acute phase of infection. The lowest concentration of SARS-CoV-2 viral copies this assay can detect is 250 copies / mL. A negative result does not preclude SARS-CoV-2 infection and should not be used as the sole basis for treatment or other patient management decisions.  A negative result may occur with improper specimen collection / handling, submission of specimen other than nasopharyngeal swab, presence of viral mutation(s) within the areas targeted by this assay, and inadequate number of viral copies (<250 copies / mL). A negative result must be combined with clinical observations, patient history, and epidemiological information.  Fact Sheet for Patients:   StrictlyIdeas.no  Fact Sheet for Healthcare Providers: BankingDealers.co.za  This test is not yet approved or  cleared by the [place] FDA and has been authorized for detection and/or diagnosis of SARS-CoV-2 by FDA under an Emergency Use Authorization (EUA).  This EUA will remain in effect (meaning this test can be used) for the duration of the COVID-19 declaration under Section 564(b)(1) of the Act, 21 U.S.C. section 360bbb-3(b)(1), unless the authorization is terminated or revoked sooner.  Performed at [facility], [address] [place], [place] [address]   Urine culture     Status: Abnormal   Collection Time: 05/31/20  8:00 PM   Specimen: Urine, Random  Result Value Ref Range Status   Specimen Description   Final     URINE, RANDOM Performed at [facility],  [place], [place] [address]    Special Requests   Final    NONE Performed at [facility], [address] [place], [place] [address]    Culture MULTIPLE SPECIES PRESENT, SUGGEST RECOLLECTION (A)  Final   Report Status 06/02/2020 FINAL  Final  Urine culture     Status: Abnormal   Collection Time: 06/01/20  5:15 AM   Specimen: Urine, Clean Catch  Result Value Ref Range Status   Specimen Description   Final    URINE, CLEAN CATCH Performed at [facility], [address] [place], [place] [address]    Special Requests   Final    NONE Performed at [facility], [address] [place], [place] [address]    Culture (A)  Final    >=100,000 COLONIES/mL ESCHERICHIA COLI 50,000 COLONIES/mL ENTEROCOCCUS FAECALIS VANCOMYCIN RESISTANT ENTEROCOCCUS ISOLATED    Report Status 06/03/2020 FINAL  Final   Organism ID, Bacteria ESCHERICHIA COLI (A)  Final   Organism ID, Bacteria ENTEROCOCCUS FAECALIS (A)  Final      Susceptibility   Escherichia coli - MIC*    AMPICILLIN <=2 SENSITIVE Sensitive     CEFAZOLIN <=4 SENSITIVE Sensitive     CEFTRIAXONE <=0.25 SENSITIVE Sensitive     CIPROFLOXACIN <=0.25 SENSITIVE Sensitive     GENTAMICIN <=1 SENSITIVE Sensitive     IMIPENEM <=0.25 SENSITIVE Sensitive     NITROFURANTOIN <=16 SENSITIVE Sensitive     TRIMETH/SULFA <=20 SENSITIVE Sensitive     AMPICILLIN/SULBACTAM <=2 SENSITIVE Sensitive     PIP/TAZO <=4 SENSITIVE Sensitive     * >=100,000 COLONIES/mL ESCHERICHIA COLI   Enterococcus faecalis - MIC*    AMPICILLIN <=2 SENSITIVE Sensitive     NITROFURANTOIN <=16 SENSITIVE Sensitive     VANCOMYCIN >=32 RESISTANT Resistant     * 50,000 COLONIES/mL ENTEROCOCCUS FAECALIS     Studies: DG Knee 1-2 Views Left  Result Date: 06/02/2020 CLINICAL DATA:  Lower extremity swelling EXAM: LEFT KNEE - 1-2 VIEW COMPARISON:  None. FINDINGS: No acute fracture or  dislocation. Severe osteoarthritis of the left knee most pronounced within the lateral and patellofemoral compartments. Moderate-sized knee joint effusion. Mineralized loose body posterior to the knee. Chondrocalcinosis. Mild soft tissue edema. IMPRESSION: 1. No acute fracture or dislocation. 2. Severe osteoarthritis of the left knee with moderate-sized knee joint effusion. Electronically Signed   By: Davina Poke D.O.   On: 06/02/2020 07:58   ECHOCARDIOGRAM COMPLETE  Result Date: 06/02/2020    ECHOCARDIOGRAM REPORT   Patient Name:   KAMONI DEPREE Date of Exam: 06/02/2020 Medical Rec #:  [id number]     Height:       59.0 in Accession #:    [id number]    Weight:       137.1 lb Date of Birth:  01-14-1934     BSA:          1.571 m Patient Age:    86 years      BP:           103/58 mmHg Patient Gender: F             HR:           75 bpm. Exam Location:  Inpatient Procedure: 2D Echo Indications:    acute diastolic CHF 998.33  History:        Patient has prior history of Echocardiogram examinations, most  recent 02/03/2020. CHF, Arrythmias:Atrial Fibrillation; Risk                 Factors:Hypertension.  Sonographer:    Johny Chess Referring Phys: [id number] [place]  1. Left ventricular ejection fraction, by estimation, is 60 to 65%. The left ventricle has normal function. The left ventricle has no regional wall motion abnormalities. There is mild left ventricular hypertrophy. Left ventricular diastolic parameters are indeterminate.  2. Right ventricular systolic function is normal. The right ventricular size is normal. There is normal pulmonary artery systolic pressure.  3. Left atrial size was moderately dilated.  4. Right atrial size was severely dilated.  5. The mitral valve is abnormal. Mild mitral valve regurgitation.  6. Tricuspid valve regurgitation is mild to moderate.  7. The aortic valve is abnormal. Aortic valve regurgitation is mild. Mild aortic valve sclerosis is  present, with no evidence of aortic valve stenosis. Comparison(s): The left ventricular function is unchanged. FINDINGS  Left Ventricle: Left ventricular ejection fraction, by estimation, is 60 to 65%. The left ventricle has normal function. The left ventricle has no regional wall motion abnormalities. The left ventricular internal cavity size was normal in size. There is  mild left ventricular hypertrophy. Left ventricular diastolic parameters are indeterminate. Right Ventricle: The right ventricular size is normal. Right vetricular wall thickness was not assessed. Right ventricular systolic function is normal. There is normal pulmonary artery systolic pressure. The tricuspid regurgitant velocity is 2.85 m/s, and with an assumed right atrial pressure of 3 mmHg, the estimated right ventricular systolic pressure is 41.3 mmHg. Left Atrium: Left atrial size was moderately dilated. Right Atrium: Right atrial size was severely dilated. Pericardium: There is no evidence of pericardial effusion. Mitral Valve: The mitral valve is abnormal. There is mild thickening of the mitral valve leaflet(s). Mild mitral annular calcification. Mild mitral valve regurgitation. Tricuspid Valve: The tricuspid valve is normal in structure. Tricuspid valve regurgitation is mild to moderate. Aortic Valve: The aortic valve is abnormal. Aortic valve regurgitation is mild. Mild aortic valve sclerosis is present, with no evidence of aortic valve stenosis. Pulmonic Valve: The pulmonic valve was not well visualized. Pulmonic valve regurgitation is trivial. Aorta: The aortic root and ascending aorta are structurally normal, with no evidence of dilitation. IAS/Shunts: No atrial level shunt detected by color flow Doppler.  LEFT VENTRICLE PLAX 2D LVIDd:         4.00 cm LVIDs:         2.50 cm LV PW:         0.70 cm LV IVS:        0.80 cm LVOT diam:     1.80 cm LV SV:         41 LV SV Index:   26 LVOT Area:     2.54 cm  IVC IVC diam: 1.70 cm LEFT ATRIUM              Index       RIGHT ATRIUM           Index LA diam:        4.00 cm 2.55 cm/m  RA Area:     17.40 cm LA Vol (A2C):   60.8 ml 38.70 ml/m RA Volume:   42.10 ml  26.80 ml/m LA Vol (A4C):   59.7 ml 38.00 ml/m LA Biplane Vol: 62.7 ml 39.91 ml/m  AORTIC VALVE LVOT Vmax:   90.70 cm/s LVOT Vmean:  58.600 cm/s LVOT VTI:    0.163 m  AORTA Ao Root diam: 2.60 cm Ao Asc diam:  3.00 cm TRICUSPID VALVE TR Peak grad:   32.5 mmHg TR Vmax:        285.00 cm/s  SHUNTS Systemic VTI:  0.16 m Systemic Diam: 1.80 cm Dorris Carnes MD Electronically signed by Dorris Carnes MD Signature Date/Time: 06/02/2020/4:21:19 PM    Final    DG HIP UNILAT WITH PELVIS 2-3 VIEWS LEFT  Result Date: 06/02/2020 CLINICAL DATA:  Lower extremity edema EXAM: DG HIP (WITH OR WITHOUT PELVIS) 2-3V LEFT COMPARISON:  04/18/2013 FINDINGS: Status post left total hip arthroplasty. Arthroplasty components are in their expected alignment without evidence of periprosthetic lucency or fracture. Partially visualized right total hip arthroplasty, grossly intact. Bones are diffusely demineralized. Partially visualized lumbar spondylosis. IMPRESSION: Status post left total hip arthroplasty without evidence of hardware complication. Electronically Signed   By: Davina Poke D.O.   On: 06/02/2020 07:57      Flora Lipps, MD  [facility] 06/03/2020

## 2020-06-03 NOTE — Progress Notes (Signed)
Sodium level still low at 118, provider notified.

## 2020-06-03 NOTE — Progress Notes (Signed)
Nephrology Follow-Up Consult note   Assessment/Recommendations: [name] is a/an 86 y.o. female with a past medical history significant for diastolic heart failure, atrial fibrillation, pulmonary hypertension, lung cancer status post right lower lobectomy, admitted for shortness of breath complicated by hyponatremia.     # Hyponatremia: Likely secondary to heart failure. Urine sodium 15 and urine osmolality 285. Minimal response to tolvaptan and 7.5 yesterday provement to 122 but back down to 118 today. Cortisol and TSH unremarkable -Tolvaptan 15 mg today -Continue to monitor sodium every 8 hours -Call nephrology if sodium rises to greater than 126 on labs -Monitor urine output and intake -Consider CT chest to look for possible recurrent malignancy although SIADH seems less likely based on urine sodium alone -We will consider initiation of urea at this afternoon or tomorrow if the patient's sodium fails to improve  #Elevated creatinine: Creatinine slightly elevated to 1.1 today. We will continue to monitor  # Acute on chronic diastolic CHF  -Mostly improved at this time. Holding. - Please obtain strict ins/outs and daily weights   # HTN  -Blood pressure somewhat low with therapy directed at atrial fibrillation and heart failure  It atrial fibrillation and heart failure  # Hx lung cancer  - lung cancer s/p right lower lobectomy in 2017 - as above may need to consider CT chest if no improvement  # UTI: Enterococcus and E. coli. May want to consider switching to Augmentin given Enterococcus. Notified primary team  # Permanent a fib - on beta blocker; rate control per primary team    Recommendations conveyed to primary service.    [facility] 06/03/2020 12:02 PM  ___________________________________________________________  CC: Hyponatremia  Interval History/Subjective: Patient is stable over the past 24 hours. She says that today she  mainly just has fatigue. Denies significant shortness of breath. Some edema is persistent. Sodium 118 today.   Medications:  Current Facility-Administered Medications  Medication Dose Route Frequency Provider Last Rate Last Admin  . 0.9 %  sodium chloride infusion  250 mL Intravenous PRN Shela Leff, MD 10 mL/hr at 06/03/20 0658 250 mL at 06/03/20 0658  . acetaminophen (TYLENOL) tablet 650 mg  650 mg Oral Q4H PRN Shela Leff, MD   650 mg at 06/02/20 2302  . apixaban (ELIQUIS) tablet 2.5 mg  2.5 mg Oral BID Pokhrel, Laxman, MD   2.5 mg at 06/03/20 0825  . calcium-vitamin D (OSCAL WITH D) 500-200 MG-UNIT per tablet 1 tablet  1 tablet Oral Daily Pokhrel, Laxman, MD   1 tablet at 06/03/20 0827  . carvedilol (COREG) tablet 6.25 mg  6.25 mg Oral BID WC Pokhrel, Laxman, MD   6.25 mg at 06/03/20 0710  . cefTRIAXone (ROCEPHIN) 1 g in sodium chloride 0.9 % 100 mL IVPB  1 g Intravenous Q24H Shela Leff, MD 200 mL/hr at 06/03/20 0709 1 g at 06/03/20 0709  . cholecalciferol (VITAMIN D3) tablet 400 Units  400 Units Oral Daily Donnamae Jude, RPH   400 Units at 06/03/20 1016  . multivitamin with minerals tablet 1 tablet  1 tablet Oral QPM Pokhrel, Laxman, MD   1 tablet at 06/02/20 1612  . pantoprazole (PROTONIX) EC tablet 40 mg  40 mg Oral Daily Pokhrel, Laxman, MD   40 mg at 06/03/20 0827  . polyvinyl alcohol (LIQUIFILM TEARS) 1.4 % ophthalmic solution 1 drop  1 drop Both Eyes PRN Blenda Nicely, RPH      . prochlorperazine (COMPAZINE) injection 5 mg  5 mg Intravenous Q6H PRN Shela Leff, MD      . scopolamine (TRANSDERM-SCOP) 1 MG/3DAYS 1.5 mg  1 patch Transdermal Q72H Shela Leff, MD   1.5 mg at 06/02/20 0325  . sodium chloride flush (NS) 0.9 % injection 3 mL  3 mL Intravenous Q12H Shela Leff, MD   3 mL at 06/02/20 2054  . sodium chloride flush (NS) 0.9 % injection 3 mL  3 mL Intravenous PRN Shela Leff, MD          Review of Systems: 10 systems reviewed  and negative except per interval history/subjective  Physical Exam: Vitals:   06/03/20 0734 06/03/20 1141  BP: (!) 106/56 101/63  Pulse: 65 61  Resp: 16 16  Temp: 98.3 F (36.8 C) 97.8 F (36.6 C)  SpO2: 99% 99%   Total I/O In: 230 [P.O.:230] Out: -   Intake/Output Summary (Last 24 hours) at 06/03/2020 1202 Last data filed at 06/03/2020 1142 Gross per 24 hour  Intake 590 ml  Output 350 ml  Net 240 ml   Constitutional: Elderly, no acute distress ENMT: ears and nose without scars or lesions, MMM CV: normal rate, nonpitting edema present in the bilateral lower extremities Respiratory: clear to auscultation, normal work of breathing Gastrointestinal: soft, non-tender, no palpable masses or hernias Skin: no visible lesions or rashes Psych: alert, judgement/insight appropriate, appropriate mood and affect   Test Results I personally reviewed new and old clinical labs and radiology tests Lab Results  Component Value Date   NA 118 (LL) 06/03/2020   K 4.5 06/03/2020   CL 84 (L) 06/03/2020   CO2 27 06/03/2020   BUN 22 06/03/2020   CREATININE 1.14 (H) 06/03/2020   CALCIUM 8.2 (L) 06/03/2020   ALBUMIN 2.5 (L) 06/03/2020   PHOS 3.5 06/03/2020

## 2020-06-03 NOTE — Progress Notes (Signed)
Progress Note  Patient Name: [name] Date of Encounter: 06/03/2020  [facility] Cardiologist: Evalina Field, MD   Subjective   Pt feeling tired. No chest pain. Stomach is distended, last BM was Wednesday. No chest pain. Sodium slowly improving. Some bradycardia overnight.   Inpatient Medications    Scheduled Meds: . apixaban  2.5 mg Oral BID  . calcium-vitamin D  1 tablet Oral Daily  . carvedilol  6.25 mg Oral BID WC  . cholecalciferol  400 Units Oral Daily  . multivitamin with minerals  1 tablet Oral QPM  . pantoprazole  40 mg Oral Daily  . scopolamine  1 patch Transdermal Q72H  . sodium chloride flush  3 mL Intravenous Q12H  . tolvaptan  15 mg Oral Once   Continuous Infusions: . sodium chloride 250 mL (06/03/20 0658)  . cefTRIAXone (ROCEPHIN)  IV 1 g (06/03/20 0709)   PRN Meds: sodium chloride, acetaminophen, polyvinyl alcohol, prochlorperazine, sodium chloride flush   Vital Signs    Vitals:   06/03/20 0030 06/03/20 0340 06/03/20 0700 06/03/20 0734  BP: 106/64 (!) 90/53 (!) 91/45 (!) 106/56  Pulse: 67 65  65  Resp: 18 18  16   Temp: 97.7 F (36.5 C) (!) 97.5 F (36.4 C)  98.3 F (36.8 C)  TempSrc: Oral Oral  Oral  SpO2: 94% 98%  99%  Weight:  62.7 kg    Height:        Intake/Output Summary (Last 24 hours) at 06/03/2020 0947 Last data filed at 06/02/2020 2147 Gross per 24 hour  Intake 480 ml  Output 350 ml  Net 130 ml   Last 3 Weights 06/03/2020 06/02/2020 06/01/2020  Weight (lbs) 138 lb 3.7 oz 137 lb 2 oz 141 lb 1.5 oz  Weight (kg) 62.7 kg 62.2 kg 64 kg      Telemetry    Afib HR 60s, bradycardia overnight - Personally Reviewed  ECG    No new - Personally Reviewed  Physical Exam   GEN: No acute distress.   Neck: No JVD Cardiac: Irreg Irreg, no murmurs, rubs, or gallops.  Respiratory: Clear to auscultation bilaterally. GI: Soft, nontender, non-distended  MS: No edema; No deformity. Neuro:  Nonfocal  Psych: Normal affect   Labs      High Sensitivity Troponin:   Recent Labs  Lab 05/30/20 1249 05/30/20 1631  TROPONINIHS 7 7      Chemistry Recent Labs  Lab 06/02/20 0117 06/02/20 0117 06/02/20 0925 06/02/20 1430 06/03/20 0132  NA 119*   < > 119* 122* 118*  K 5.1  --  4.8  --  4.5  CL 81*  --  85*  --  84*  CO2 27  --  25  --  27  GLUCOSE 129*  --  124*  --  119*  BUN 13  --  15  --  22  CREATININE 0.94  --  0.99  --  1.14*  CALCIUM 8.4*  --  8.2*  --  8.2*  PROT 5.9*  --   --   --  5.3*  ALBUMIN 2.9*  --   --   --  2.5*  AST 32  --   --   --  23  ALT 17  --   --   --  17  ALKPHOS 67  --   --   --  59  BILITOT 1.7*  --   --   --  0.9  GFRNONAA 56*  --  53*  --  44*  GFRAA >60  --  >60  --  51*  ANIONGAP 11  --  9  --  7   < > = values in this interval not displayed.     Hematology Recent Labs  Lab 05/30/20 1249 06/02/20 0925 06/03/20 0132  WBC 7.4  --  8.2  RBC 3.66* 3.66* 3.48*  HGB 11.3*  --  10.7*  HCT 34.3*  --  32.6*  MCV 93.7  --  93.7  MCH 30.9  --  30.7  MCHC 32.9  --  32.8  RDW 14.1  --  14.4  PLT 196  --  189    BNP Recent Labs  Lab 05/30/20 1249 06/01/20 0958  BNP 402.7* 299.4*     DDimer No results for input(s): DDIMER in the last 168 hours.   Radiology    DG Knee 1-2 Views Left  Result Date: 06/02/2020 CLINICAL DATA:  Lower extremity swelling EXAM: LEFT KNEE - 1-2 VIEW COMPARISON:  None. FINDINGS: No acute fracture or dislocation. Severe osteoarthritis of the left knee most pronounced within the lateral and patellofemoral compartments. Moderate-sized knee joint effusion. Mineralized loose body posterior to the knee. Chondrocalcinosis. Mild soft tissue edema. IMPRESSION: 1. No acute fracture or dislocation. 2. Severe osteoarthritis of the left knee with moderate-sized knee joint effusion. Electronically Signed   By: Davina Poke D.O.   On: 06/02/2020 07:58   ECHOCARDIOGRAM COMPLETE  Result Date: 06/02/2020    ECHOCARDIOGRAM REPORT   Patient Name:   [name]  Date of Exam: 06/02/2020 Medical Rec #:  [id number]     Height:       59.0 in Accession #:    [id number]    Weight:       137.1 lb Date of Birth:  [birth year]     BSA:          1.571 m Patient Age:    86 years      BP:           103/58 mmHg Patient Gender: F             HR:           75 bpm. Exam Location:  Inpatient Procedure: 2D Echo Indications:    acute diastolic CHF 235.57  History:        Patient has prior history of Echocardiogram examinations, most                 recent 02/03/2020. CHF, Arrythmias:Atrial Fibrillation; Risk                 Factors:Hypertension.  Sonographer:    Johny Chess Referring Phys: [id number] [place]  1. Left ventricular ejection fraction, by estimation, is 60 to 65%. The left ventricle has normal function. The left ventricle has no regional wall motion abnormalities. There is mild left ventricular hypertrophy. Left ventricular diastolic parameters are indeterminate.  2. Right ventricular systolic function is normal. The right ventricular size is normal. There is normal pulmonary artery systolic pressure.  3. Left atrial size was moderately dilated.  4. Right atrial size was severely dilated.  5. The mitral valve is abnormal. Mild mitral valve regurgitation.  6. Tricuspid valve regurgitation is mild to moderate.  7. The aortic valve is abnormal. Aortic valve regurgitation is mild. Mild aortic valve sclerosis is present, with no evidence of aortic valve stenosis. Comparison(s): The left ventricular function is unchanged. FINDINGS  Left Ventricle: Left ventricular ejection  fraction, by estimation, is 60 to 65%. The left ventricle has normal function. The left ventricle has no regional wall motion abnormalities. The left ventricular internal cavity size was normal in size. There is  mild left ventricular hypertrophy. Left ventricular diastolic parameters are indeterminate. Right Ventricle: The right ventricular size is normal. Right vetricular wall thickness was not  assessed. Right ventricular systolic function is normal. There is normal pulmonary artery systolic pressure. The tricuspid regurgitant velocity is 2.85 m/s, and with an assumed right atrial pressure of 3 mmHg, the estimated right ventricular systolic pressure is 20.2 mmHg. Left Atrium: Left atrial size was moderately dilated. Right Atrium: Right atrial size was severely dilated. Pericardium: There is no evidence of pericardial effusion. Mitral Valve: The mitral valve is abnormal. There is mild thickening of the mitral valve leaflet(s). Mild mitral annular calcification. Mild mitral valve regurgitation. Tricuspid Valve: The tricuspid valve is normal in structure. Tricuspid valve regurgitation is mild to moderate. Aortic Valve: The aortic valve is abnormal. Aortic valve regurgitation is mild. Mild aortic valve sclerosis is present, with no evidence of aortic valve stenosis. Pulmonic Valve: The pulmonic valve was not well visualized. Pulmonic valve regurgitation is trivial. Aorta: The aortic root and ascending aorta are structurally normal, with no evidence of dilitation. IAS/Shunts: No atrial level shunt detected by color flow Doppler.  LEFT VENTRICLE PLAX 2D LVIDd:         4.00 cm LVIDs:         2.50 cm LV PW:         0.70 cm LV IVS:        0.80 cm LVOT diam:     1.80 cm LV SV:         41 LV SV Index:   26 LVOT Area:     2.54 cm  IVC IVC diam: 1.70 cm LEFT ATRIUM             Index       RIGHT ATRIUM           Index LA diam:        4.00 cm 2.55 cm/m  RA Area:     17.40 cm LA Vol (A2C):   60.8 ml 38.70 ml/m RA Volume:   42.10 ml  26.80 ml/m LA Vol (A4C):   59.7 ml 38.00 ml/m LA Biplane Vol: 62.7 ml 39.91 ml/m  AORTIC VALVE LVOT Vmax:   90.70 cm/s LVOT Vmean:  58.600 cm/s LVOT VTI:    0.163 m  AORTA Ao Root diam: 2.60 cm Ao Asc diam:  3.00 cm TRICUSPID VALVE TR Peak grad:   32.5 mmHg TR Vmax:        285.00 cm/s  SHUNTS Systemic VTI:  0.16 m Systemic Diam: 1.80 cm Dorris Carnes MD Electronically signed by Dorris Carnes  MD Signature Date/Time: 06/02/2020/4:21:19 PM    Final    DG HIP UNILAT WITH PELVIS 2-3 VIEWS LEFT  Result Date: 06/02/2020 CLINICAL DATA:  Lower extremity edema EXAM: DG HIP (WITH OR WITHOUT PELVIS) 2-3V LEFT COMPARISON:  04/18/2013 FINDINGS: Status post left total hip arthroplasty. Arthroplasty components are in their expected alignment without evidence of periprosthetic lucency or fracture. Partially visualized right total hip arthroplasty, grossly intact. Bones are diffusely demineralized. Partially visualized lumbar spondylosis. IMPRESSION: Status post left total hip arthroplasty without evidence of hardware complication. Electronically Signed   By: Davina Poke D.O.   On: 06/02/2020 07:57    Cardiac Studies   Echo 02/03/20  1. Left ventricular ejection  fraction, by estimation, is 60 to 65%. The  left ventricle has normal function. The left ventricle has no regional  wall motion abnormalities. Left ventricular diastolic function could not  be evaluated.  2. Right ventricular systolic function is normal. The right ventricular  size is normal. There is severely elevated pulmonary artery systolic  pressure. The estimated right ventricular systolic pressure is 44.9 mmHg.  3. Left atrial size was moderately dilated.  4. Right atrial size was severely dilated.  5. The mitral valve is degenerative. Mild mitral valve regurgitation.  6. Tricuspid valve regurgitation is moderate.  7. The aortic valve is tricuspid. Aortic valve regurgitation is trivial.  Mild aortic valve sclerosis is present, with no evidence of aortic valve  stenosis.  8. The inferior vena cava is dilated in size with <50% respiratory  variability, suggesting right atrial pressure of 15 mmHg.    Patient Profile     86 y.o. 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  Assessment & Plan    Acute on chronic respiratory failure in the setting of acute on chronic CHF and known pulmonary HTN - Pt requiring 2L O2 in the ER. BNP 402 and CXR showed possible pulmonary edema vs atypical infection, unchanged Rt pleural effusion - started on IV lasix 4 mg Q8H>>disconitnued 8/19 - patient reported she has been off thiazide for months given history of hyponatremia - Echo in April showed preserved EF with mild pulmonary hypertension, which likely contributing to sob - BNP down to 299. I/Os not complete. Weight 138lbs today - Euvolemic on exam   Hyponatremia - continues to be low despite IV lasix and fluid restriction - likely contributing to nausea - normal TSH - Nephrology following>>tolvaptan administered - Na 122>118 today  Permanent Afib - rate controlled on coreg - continue Eliquis  UTI - abx per IM    For questions or updates, please contact [facility] Please consult [email or web address] for contact info under        Signed, [name] Huhta Ninfa Meeker, PA-C  06/03/2020, 9:47 AM

## 2020-06-04 ENCOUNTER — Inpatient Hospital Stay: Payer: Self-pay

## 2020-06-04 LAB — BASIC METABOLIC PANEL
Anion gap: 15 (ref 5–15)
Anion gap: 10 (ref 5–15)
Anion gap: 12 (ref 5–15)
Anion gap: 11 (ref 5–15)
BUN: 40 mg/dL — ABNORMAL HIGH (ref 8–23)
BUN: 37 mg/dL — ABNORMAL HIGH (ref 8–23)
BUN: 54 mg/dL — ABNORMAL HIGH (ref 8–23)
BUN: 54 mg/dL — ABNORMAL HIGH (ref 8–23)
CO2: 18 mmol/L — ABNORMAL LOW (ref 22–32)
CO2: 26 mmol/L (ref 22–32)
CO2: 21 mmol/L — ABNORMAL LOW (ref 22–32)
CO2: 26 mmol/L (ref 22–32)
Calcium: 8.8 mg/dL — ABNORMAL LOW (ref 8.9–10.3)
Calcium: 8.8 mg/dL — ABNORMAL LOW (ref 8.9–10.3)
Calcium: 8.7 mg/dL — ABNORMAL LOW (ref 8.9–10.3)
Calcium: 9.4 mg/dL (ref 8.9–10.3)
Chloride: 84 mmol/L — ABNORMAL LOW (ref 98–111)
Chloride: 81 mmol/L — ABNORMAL LOW (ref 98–111)
Chloride: 82 mmol/L — ABNORMAL LOW (ref 98–111)
Chloride: 80 mmol/L — ABNORMAL LOW (ref 98–111)
Creatinine, Ser: 1.15 mg/dL — ABNORMAL HIGH (ref 0.44–1.00)
Creatinine, Ser: 1.16 mg/dL — ABNORMAL HIGH (ref 0.44–1.00)
Creatinine, Ser: 1.12 mg/dL — ABNORMAL HIGH (ref 0.44–1.00)
Creatinine, Ser: 1.28 mg/dL — ABNORMAL HIGH (ref 0.44–1.00)
GFR calc Af Amer: 51 mL/min — ABNORMAL LOW (ref >60)
GFR calc Af Amer: 50 mL/min — ABNORMAL LOW (ref >60)
GFR calc Af Amer: 53 mL/min — ABNORMAL LOW (ref >60)
GFR calc Af Amer: 45 mL/min — ABNORMAL LOW (ref >60)
GFR calc non Af Amer: 44 mL/min — ABNORMAL LOW (ref >60)
GFR calc non Af Amer: 44 mL/min — ABNORMAL LOW (ref >60)
GFR calc non Af Amer: 45 mL/min — ABNORMAL LOW (ref >60)
GFR calc non Af Amer: 39 mL/min — ABNORMAL LOW (ref >60)
Glucose, Bld: 102 mg/dL — ABNORMAL HIGH (ref 70–99)
Glucose, Bld: 107 mg/dL — ABNORMAL HIGH (ref 70–99)
Glucose, Bld: 141 mg/dL — ABNORMAL HIGH (ref 70–99)
Glucose, Bld: 133 mg/dL — ABNORMAL HIGH (ref 70–99)
Potassium: 5.1 mmol/L (ref 3.5–5.1)
Potassium: 4.7 mmol/L (ref 3.5–5.1)
Potassium: 4.9 mmol/L (ref 3.5–5.1)
Potassium: 4.9 mmol/L (ref 3.5–5.1)
Sodium: 117 mmol/L — CL (ref 135–145)
Sodium: 117 mmol/L — CL (ref 135–145)
Sodium: 115 mmol/L — CL (ref 135–145)
Sodium: 117 mmol/L — CL (ref 135–145)

## 2020-06-04 LAB — CBC
HCT: 33.6 % — ABNORMAL LOW (ref 36.0–46.0)
Hemoglobin: 11.2 g/dL — ABNORMAL LOW (ref 12.0–15.0)
MCH: 31.2 pg (ref 26.0–34.0)
MCHC: 33.3 g/dL (ref 30.0–36.0)
MCV: 93.6 fL (ref 80.0–100.0)
Platelets: 219 10*3/uL (ref 150–400)
RBC: 3.59 MIL/uL — ABNORMAL LOW (ref 3.87–5.11)
RDW: 14.1 % (ref 11.5–15.5)
WBC: 9.5 10*3/uL (ref 4.0–10.5)
nRBC: 0 % (ref 0.0–0.2)

## 2020-06-04 LAB — OSMOLALITY: Osmolality: 267 mOsm/kg — ABNORMAL LOW (ref 275–295)

## 2020-06-04 LAB — OSMOLALITY, URINE: Osmolality, Ur: 232 mOsm/kg — ABNORMAL LOW (ref 300–900)

## 2020-06-04 LAB — MAGNESIUM: Magnesium: 2.2 mg/dL (ref 1.7–2.4)

## 2020-06-04 MED ORDER — POLYETHYLENE GLYCOL 3350 17 G PO PACK: 17.0000 g | PACK | Freq: Every day | ORAL | Status: DC

## 2020-06-04 MED ORDER — BISACODYL 5 MG PO TBEC: 5.0000 mg | DELAYED_RELEASE_TABLET | Freq: Every day | ORAL | Status: DC | PRN

## 2020-06-04 MED ORDER — SODIUM CHLORIDE 3 % IV SOLN
INTRAVENOUS | Status: DC
  Filled 2020-06-04 (×2): qty 500

## 2020-06-04 MED ORDER — TOLVAPTAN 15 MG PO TABS
30.0000 mg | ORAL_TABLET | Freq: Once | ORAL | Status: AC
  Administered 2020-06-04: 30 mg via ORAL
  Filled 2020-06-04: qty 2

## 2020-06-04 MED ORDER — ENSURE ENLIVE PO LIQD
237.0000 mL | Freq: Two times a day (BID) | ORAL | Status: DC
  Administered 2020-06-04 – 2020-06-07 (×8): 237 mL via ORAL

## 2020-06-04 MED ORDER — POLYETHYLENE GLYCOL 3350 17 G PO PACK
17.0000 g | PACK | Freq: Every day | ORAL | Status: DC
  Administered 2020-06-04 – 2020-06-05 (×2): 17 g via ORAL
  Filled 2020-06-04 (×3): qty 1

## 2020-06-04 MED ORDER — BISACODYL 10 MG RE SUPP
10.0000 mg | Freq: Every day | RECTAL | Status: DC | PRN
  Administered 2020-06-04 – 2020-06-05 (×2): 10 mg via RECTAL
  Filled 2020-06-04 (×2): qty 1

## 2020-06-04 NOTE — Treatment Plan (Signed)
Na decreased to 115 despite aggressive medical therapy Will start hypertonic saline 50cc/hr and continue urea. Na checks q4hrs for now. Goal correction no greater than 123 in 24 hours.

## 2020-06-04 NOTE — Progress Notes (Signed)
Sodium level still 117, provider notified.

## 2020-06-04 NOTE — Plan of Care (Signed)
  Problem: Education: Goal: Ability to demonstrate management of disease process will improve Outcome: Progressing   Problem: Activity: Goal: Capacity to carry out activities will improve Outcome: Progressing   

## 2020-06-04 NOTE — Progress Notes (Signed)
PROGRESS NOTE    [name]  WCB:[id number] DOB: 04/08/1934 DOA: 05/30/2020 PCP: Marton Redwood, MD    Brief Narrative:  Patient was admitted to the hospital with a working diagnosis of acute hypoxic respiratory failure secondary to acute on chronic diastolic heart failure.  86 year old female with past medical history for diastolic heart failure, chronic atrial fibrillation, pulmonary hypertension, breast cancer, carcinoid bronchial adenoma status post lobectomy, right upper extremity lymphedema, hypertension and GERD.  Patient reported generalized weakness cough dyspnea and worsening lower extremity edema for about 2 days.  Positive cough, and orthopnea.  On her initial physical examination blood pressure 118/67, 98/50, heart rate 78, respiratory 20, temperature 98.4, oxygen saturation 96%.  Pulmonary Rales bilateral bases, heart S1-S2, present rhythmic, abdomen distended, positive lower extremity edema. Sodium 118, potassium 3.8, chloride 82, bicarb 24, glucose 126, BUN 9, creatinine 0.5 white count 7.4, hemoglobin 11.3, hematocrit 34.3, platelets 196.  SARS COVID-19 negative.  Urinalysis specific gravity greater than 1.030, urine osmolality 285, urine sodium 15.  Chest radiograph with right rotation, positive right pleural effusion small to medium size.  EKG 71 bpm, normal axis, right bundle branch block, manually corrected QTC 480, atrial fibrillation rhythm, no significant ST segment or T wave changes.   Assessment & Plan:   Principal Problem:   Acute exacerbation of congestive heart failure (HCC) Active Problems:   Hyponatremia   Nausea   Acute heart failure (HCC)   Acute hypoxemic respiratory failure ([place])   1. Acute on chronic diastolic heart failure exacerbation. Patient with improved volume, no dyspnea at rest. Not documented urine output. Systolic blood pressure 616 mmHg. Serum cr at 1,12.  Continue holding furosemide for now.   2. Hyponatremia/ SIADH/ Patient with  persistent hyponatremia, now down to 115. Patient with poor oral intake.  Sp tolvaptan and on Urea pack 15 g bid. Fluid restriction 1000 ml per day.  Follow up Na this pm is down to 115. Patient may need high sodium load with hypertonic saline. Will follow with nephrology recommendations.   Will order a PICC line, patient will need frequent BMP.  3. Chronic atrial fibrillation. Continue rate control with carvedilol and anticoagulation with apixaban.   4. Constipation. Continue miralx and as needed dulcolax.    Patient continue to be at high risk for   Status is: Inpatient  Remains inpatient appropriate because:IV treatments appropriate due to intensity of illness or inability to take PO   Dispo: The patient is from: Home              Anticipated d/c is to: Home              Anticipated d/c date is: 2 days              Patient currently is not medically stable to d/c.  DVT prophylaxis: Apixaban.   Code Status:   dnr  Family Communication: I spoke with patient's husband at the bedside, we talked in detail about patient's condition, plan of care and prognosis and all questions were addressed.      Consultants:   Nephrology     Subjective: Patient continue to be very weak and deconditioned, no nausea or vomiting, no chest pain or dyspnea. Continue with constipation.   Objective: Vitals:   06/04/20 0012 06/04/20 0349 06/04/20 0954 06/04/20 1235  BP: (!) 104/59 116/68 (!) 126/58 (!) 100/55  Pulse: (!) 53 69 70 61  Resp: 19 19 20 18   Temp: 97.7 F (36.5 C) 98 F (  36.7 C) (!) 97.3 F (36.3 C) 97.8 F (36.6 C)  TempSrc: Oral Oral Oral Oral  SpO2: 93% 99% 100% 98%  Weight: 63.9 kg     Height:        Intake/Output Summary (Last 24 hours) at 06/04/2020 1545 Last data filed at 06/04/2020 0900 Gross per 24 hour  Intake 987.9 ml  Output --  Net 987.9 ml   Filed Weights   06/02/20 0434 06/03/20 0340 06/04/20 0012  Weight: 62.2 kg 62.7 kg 63.9 kg    Examination:    General: deconditioned and ill looking appearing  Neurology: Awake and alert, non focal  E ENT: no pallor, no icterus, oral mucosa moist Cardiovascular: No JVD. S1-S2 present, rhythmic, no gallops, rubs, or murmurs. Trace lower extremity edema. Pulmonary: positive breath sounds bilaterally, adequate air movement, no wheezing, rhonchi or rales. Gastrointestinal. Abdomen soft and non tender Musculoskeletal: no joint deformities     Data Reviewed: I have personally reviewed following labs and imaging studies  CBC: Recent Labs  Lab 05/30/20 1249 06/03/20 0132  WBC 7.4 8.2  NEUTROABS 6.5  --   HGB 11.3* 10.7*  HCT 34.3* 32.6*  MCV 93.7 93.7  PLT 196 500   Basic Metabolic Panel: Recent Labs  Lab 06/01/20 0958 06/01/20 1649 06/02/20 0117 06/02/20 0117 06/02/20 0925 06/02/20 1430 06/03/20 0132 06/03/20 1109 06/03/20 1908 06/04/20 0237 06/04/20 0804  NA 117*   < > 119*   < > 119*   < > 118* 120* 117* 117* 117*  K 4.0   < > 5.1   < > 4.8  --  4.5  --  4.5 5.1 4.7  CL 80*   < > 81*   < > 85*  --  84*  --  81* 84* 81*  CO2 26   < > 27   < > 25  --  27  --  27 18* 26  GLUCOSE 109*   < > 129*   < > 124*  --  119*  --  157* 102* 107*  BUN 11   < > 13   < > 15  --  22  --  26* 40* 37*  CREATININE 0.56   < > 0.94   < > 0.99  --  1.14*  --  1.24* 1.15* 1.16*  CALCIUM 8.3*   < > 8.4*   < > 8.2*  --  8.2*  --  8.3* 8.8* 8.8*  MG 1.7  --  1.5*  --   --   --  2.0  --   --  2.2  --   PHOS  --   --   --   --   --   --  3.5  --   --   --   --    < > = values in this interval not displayed.   GFR: Estimated Creatinine Clearance: 29.9 mL/min (A) (by C-G formula based on SCr of 1.16 mg/dL (H)). Liver Function Tests: Recent Labs  Lab 06/02/20 0117 06/03/20 0132  AST 32 23  ALT 17 17  ALKPHOS 67 59  BILITOT 1.7* 0.9  PROT 5.9* 5.3*  ALBUMIN 2.9* 2.5*   Recent Labs  Lab 06/02/20 0117  LIPASE 52*   No results for input(s): AMMONIA in the last 168 hours. Coagulation  Profile: No results for input(s): INR, PROTIME in the last 168 hours. Cardiac Enzymes: Recent Labs  Lab 06/01/20 0958  CKTOTAL 53   BNP (last 3 results)  No results for input(s): PROBNP in the last 8760 hours. HbA1C: No results for input(s): HGBA1C in the last 72 hours. CBG: No results for input(s): GLUCAP in the last 168 hours. Lipid Profile: No results for input(s): CHOL, HDL, LDLCALC, TRIG, CHOLHDL, LDLDIRECT in the last 72 hours. Thyroid Function Tests: Recent Labs    06/02/20 0925  TSH 1.293   Anemia Panel: Recent Labs    06/02/20 0925  VITAMINB12 1,179*  FOLATE 20.3  FERRITIN 227  TIBC 232*  IRON 25*  RETICCTPCT 2.8      Radiology Studies: I have reviewed all of the imaging during this hospital visit personally     Scheduled Meds: . apixaban  2.5 mg Oral BID  . calcium-vitamin D  1 tablet Oral Daily  . carvedilol  6.25 mg Oral BID WC  . cholecalciferol  400 Units Oral Daily  . feeding supplement (ENSURE ENLIVE)  237 mL Oral BID BM  . multivitamin with minerals  1 tablet Oral QPM  . pantoprazole  40 mg Oral Daily  . scopolamine  1 patch Transdermal Q72H  . sodium chloride flush  3 mL Intravenous Q12H  . Urea  15 g Oral BID   Continuous Infusions: . sodium chloride 250 mL (06/03/20 0658)     LOS: 3 days        Kenidi Elenbaas Gerome Apley, MD

## 2020-06-04 NOTE — Progress Notes (Signed)
Nephrology Follow-Up Consult note   Assessment/Recommendations: [name] is a/an 86 y.o. female with a past medical history significant for diastolic heart failure, atrial fibrillation, pulmonary hypertension, lung cancer status post right lower lobectomy, admitted for shortness of breath complicated by hyponatremia.     # Hyponatremia: Likely secondary to heart failure based on previous evaluation. Urine sodium 15 and urine osmolality 285.  Cortisol and TSH unremarkable.  Sodium fluctuating in the afternoons but failed to have significant improvement. -Increase tolvaptan to 30 mg today -Continue urea 15 g twice daily as tolerated -Continue to monitor sodium every 8 hours -If she fails to improve by this afternoon or goes back down tomorrow morning likely will repeat urine testing to ensure this is not look like SIADH and likely start her on hypertonic saline -Call nephrology if sodium rises to greater than 126 on labs before tomorrow morning -Monitor urine output and intake -Consider CT chest to look for possible recurrent malignancy although SIADH seems less likely based on urine sodium alone  #Elevated creatinine: Creatinine remains slightly elevated today.  1.16.  We will continue to monitor.  # Acute on chronic diastolic CHF  -Mostly improved at this time. Holding furosemide - Please obtain strict ins/outs and daily weights   # HTN  -Blood pressure somewhat low with therapy directed at atrial fibrillation and heart failure   # Hx lung cancer  - lung cancer s/p right lower lobectomy in 2017 - as above may need to consider CT chest if no improvement  # UTI: Enterococcus and E. Coli isolated on culture.  Discussed with primary and due to lack of symptoms no antibiotic therapy at this time  # Permanent a fib - on beta blocker; rate control per primary team    Recommendations conveyed to primary service.    [facility] 06/04/2020 10:28  AM  ___________________________________________________________  CC: Hyponatremia  Interval History/Subjective: Worsening sodium yesterday evening.  Started urea.  Sodium persistently low this morning.  Patient has had worsening abdominal pain over the past 24 hours.  She has not had a bowel movement since Wednesday.  She is trying a medication to help with her constipation.  She denies significant changes in her mental status.  She does feel thirsty.  Difficult time drinking the urea last night.   Medications:  Current Facility-Administered Medications  Medication Dose Route Frequency Provider Last Rate Last Admin  . 0.9 %  sodium chloride infusion  250 mL Intravenous PRN Shela Leff, MD 10 mL/hr at 06/03/20 0658 250 mL at 06/03/20 0658  . acetaminophen (TYLENOL) tablet 650 mg  650 mg Oral Q4H PRN Shela Leff, MD   650 mg at 06/04/20 0358  . apixaban (ELIQUIS) tablet 2.5 mg  2.5 mg Oral BID Pokhrel, Laxman, MD   2.5 mg at 06/04/20 0901  . bisacodyl (DULCOLAX) EC tablet 5 mg  5 mg Oral Daily PRN Arrien, Jimmy Picket, MD      . bisacodyl (DULCOLAX) suppository 10 mg  10 mg Rectal Daily PRN Reesa Chew, MD   10 mg at 06/04/20 1001  . calcium-vitamin D (OSCAL WITH D) 500-200 MG-UNIT per tablet 1 tablet  1 tablet Oral Daily Pokhrel, Laxman, MD   1 tablet at 06/04/20 0901  . carvedilol (COREG) tablet 6.25 mg  6.25 mg Oral BID WC Pokhrel, Laxman, MD   6.25 mg at 06/04/20 0947  . cholecalciferol (VITAMIN D3) tablet 400 Units  400 Units Oral Daily Donnamae Jude, [facility]  400 Units at 06/04/20 0901  . feeding supplement (ENSURE ENLIVE) (ENSURE ENLIVE) liquid 237 mL  237 mL Oral BID BM Arrien, Jimmy Picket, MD   237 mL at 06/04/20 5170  . multivitamin with minerals tablet 1 tablet  1 tablet Oral QPM Pokhrel, Laxman, MD   1 tablet at 06/03/20 1744  . pantoprazole (PROTONIX) EC tablet 40 mg  40 mg Oral Daily Pokhrel, Laxman, MD   40 mg at 06/04/20 0901  . polyvinyl alcohol  (LIQUIFILM TEARS) 1.4 % ophthalmic solution 1 drop  1 drop Both Eyes PRN Blenda Nicely, RPH      . prochlorperazine (COMPAZINE) injection 5 mg  5 mg Intravenous Q6H PRN Shela Leff, MD      . scopolamine (TRANSDERM-SCOP) 1 MG/3DAYS 1.5 mg  1 patch Transdermal Q72H Shela Leff, MD   1.5 mg at 06/02/20 0325  . sodium chloride flush (NS) 0.9 % injection 3 mL  3 mL Intravenous Q12H Shela Leff, MD   3 mL at 06/04/20 0902  . sodium chloride flush (NS) 0.9 % injection 3 mL  3 mL Intravenous PRN Shela Leff, MD      . Urea PACK 15 g  15 g Oral BID Joselyn Glassman A, RPH   15 g at 06/04/20 0174      Review of Systems: 10 systems reviewed and negative except per interval history/subjective  Physical Exam: Vitals:   06/04/20 0349 06/04/20 0954  BP: 116/68 (!) 126/58  Pulse: 69 70  Resp: 19 20  Temp: 98 F (36.7 C) (!) 97.3 F (36.3 C)  SpO2: 99% 100%   Total I/O In: 240 [P.O.:240] Out: -   Intake/Output Summary (Last 24 hours) at 06/04/2020 1028 Last data filed at 06/04/2020 0900 Gross per 24 hour  Intake 1517.9 ml  Output --  Net 1517.9 ml   Constitutional: Elderly, no acute distress ENMT: ears and nose without scars or lesions, MMM CV: normal rate, nonpitting edema present in the bilateral lower extremities Respiratory: Bilateral chest rise, normal work of breathing Gastrointestinal: Mild distention, pain with palpation diffusely Skin: no visible lesions or rashes Psych: alert, judgement/insight appropriate, appropriate mood and affect   Test Results I personally reviewed new and old clinical labs and radiology tests Lab Results  Component Value Date   NA 117 (LL) 06/04/2020   K 4.7 06/04/2020   CL 81 (L) 06/04/2020   CO2 26 06/04/2020   BUN 37 (H) 06/04/2020   CREATININE 1.16 (H) 06/04/2020   CALCIUM 8.8 (L) 06/04/2020   ALBUMIN 2.5 (L) 06/03/2020   PHOS 3.5 06/03/2020

## 2020-06-04 NOTE — Progress Notes (Signed)
Sodium Basic metabolic panel Collected: 06/04/20 1520  Result status: Final  Resulting lab: [facility]  Reference range: 135 - 145 mmol/L  Value: 115Low Panic   Comment: CRITICAL RESULT CALLED TO, READ BACK BY AND VERIFIED WITH:  B.Ghassan Coggeshall,RN 06/04/2020 1622 DAVISB   *Additional information available - comment   Dr.Arrien notified of above reported Dr.Peeples had put in orders for bmp q 4 hr and hypertonic saline,and picc line placement.

## 2020-06-05 LAB — BASIC METABOLIC PANEL
Anion gap: 8 (ref 5–15)
Anion gap: 11 (ref 5–15)
Anion gap: 17 — ABNORMAL HIGH (ref 5–15)
Anion gap: 10 (ref 5–15)
BUN: 57 mg/dL — ABNORMAL HIGH (ref 8–23)
BUN: 54 mg/dL — ABNORMAL HIGH (ref 8–23)
BUN: 53 mg/dL — ABNORMAL HIGH (ref 8–23)
BUN: 49 mg/dL — ABNORMAL HIGH (ref 8–23)
CO2: 25 mmol/L (ref 22–32)
CO2: 24 mmol/L (ref 22–32)
CO2: 17 mmol/L — ABNORMAL LOW (ref 22–32)
CO2: 26 mmol/L (ref 22–32)
Calcium: 8.7 mg/dL — ABNORMAL LOW (ref 8.9–10.3)
Calcium: 9 mg/dL (ref 8.9–10.3)
Calcium: 8.8 mg/dL — ABNORMAL LOW (ref 8.9–10.3)
Calcium: 9.2 mg/dL (ref 8.9–10.3)
Chloride: 83 mmol/L — ABNORMAL LOW (ref 98–111)
Chloride: 87 mmol/L — ABNORMAL LOW (ref 98–111)
Chloride: 93 mmol/L — ABNORMAL LOW (ref 98–111)
Chloride: 93 mmol/L — ABNORMAL LOW (ref 98–111)
Creatinine, Ser: 1.16 mg/dL — ABNORMAL HIGH (ref 0.44–1.00)
Creatinine, Ser: 1.06 mg/dL — ABNORMAL HIGH (ref 0.44–1.00)
Creatinine, Ser: 1.12 mg/dL — ABNORMAL HIGH (ref 0.44–1.00)
Creatinine, Ser: 1.17 mg/dL — ABNORMAL HIGH (ref 0.44–1.00)
GFR calc Af Amer: 50 mL/min — ABNORMAL LOW (ref >60)
GFR calc Af Amer: 56 mL/min — ABNORMAL LOW (ref >60)
GFR calc Af Amer: 53 mL/min — ABNORMAL LOW (ref >60)
GFR calc Af Amer: 50 mL/min — ABNORMAL LOW (ref >60)
GFR calc non Af Amer: 44 mL/min — ABNORMAL LOW (ref >60)
GFR calc non Af Amer: 49 mL/min — ABNORMAL LOW (ref >60)
GFR calc non Af Amer: 45 mL/min — ABNORMAL LOW (ref >60)
GFR calc non Af Amer: 43 mL/min — ABNORMAL LOW (ref >60)
Glucose, Bld: 141 mg/dL — ABNORMAL HIGH (ref 70–99)
Glucose, Bld: 104 mg/dL — ABNORMAL HIGH (ref 70–99)
Glucose, Bld: 121 mg/dL — ABNORMAL HIGH (ref 70–99)
Glucose, Bld: 138 mg/dL — ABNORMAL HIGH (ref 70–99)
Potassium: 5.2 mmol/L — ABNORMAL HIGH (ref 3.5–5.1)
Potassium: 5.1 mmol/L (ref 3.5–5.1)
Potassium: 5.9 mmol/L — ABNORMAL HIGH (ref 3.5–5.1)
Potassium: 5.8 mmol/L — ABNORMAL HIGH (ref 3.5–5.1)
Sodium: 116 mmol/L — CL (ref 135–145)
Sodium: 122 mmol/L — ABNORMAL LOW (ref 135–145)
Sodium: 127 mmol/L — ABNORMAL LOW (ref 135–145)
Sodium: 129 mmol/L — ABNORMAL LOW (ref 135–145)

## 2020-06-05 LAB — SODIUM: Sodium: 124 mmol/L — ABNORMAL LOW (ref 135–145)

## 2020-06-05 LAB — SODIUM, URINE, RANDOM: Sodium, Ur: <10 mmol/L

## 2020-06-05 MED ORDER — SODIUM POLYSTYRENE SULFONATE 15 GM/60ML PO SUSP
30.0000 g | Freq: Once | ORAL | Status: AC
  Administered 2020-06-05: 30 g via ORAL
  Filled 2020-06-05: qty 120

## 2020-06-05 MED ORDER — SODIUM CHLORIDE 1 G PO TABS
1.0000 g | ORAL_TABLET | Freq: Three times a day (TID) | ORAL | Status: DC
  Administered 2020-06-05: 1 g via ORAL
  Filled 2020-06-05 (×2): qty 1

## 2020-06-05 MED ORDER — SODIUM CHLORIDE 1 G PO TABS
1.0000 g | ORAL_TABLET | Freq: Three times a day (TID) | ORAL | Status: DC
  Filled 2020-06-05: qty 1

## 2020-06-05 MED ORDER — POLYETHYLENE GLYCOL 3350 17 G PO PACK
17.0000 g | PACK | Freq: Every day | ORAL | Status: DC | PRN
  Administered 2020-06-07: 17 g via ORAL
  Filled 2020-06-05: qty 1

## 2020-06-05 MED ORDER — BISACODYL 5 MG PO TBEC: 5.0000 mg | DELAYED_RELEASE_TABLET | Freq: Every day | ORAL | Status: DC | PRN

## 2020-06-05 MED ORDER — DEXTROSE 5 % IV SOLN: INTRAVENOUS | Status: AC

## 2020-06-05 NOTE — Progress Notes (Signed)
Patient BP this evening is low 86/54 pt asymptomatic MD notified via text no new order given will continue to monitor the patient.

## 2020-06-05 NOTE — Treatment Plan (Signed)
Na slightly above goal at 124. Retimed Na tablets to tomorrow morning. F/u repeat Na 5pm and adjust treatment as needed.

## 2020-06-05 NOTE — Progress Notes (Signed)
Secure chat and phone conversation with Dr Cathlean Sauer re PICC placement for hypertonic solution, which has been cancelled. New order to hold on PICC placement for now. Will recheck labs this pm and if trending down, will reorder for lab draws.

## 2020-06-05 NOTE — Progress Notes (Signed)
PROGRESS NOTE    [name]  HYQ:[id number] DOB: [birth year] DOA: 05/30/2020 PCP: Marton Redwood, MD    Brief Narrative:  Patient was admitted to the hospital with a working diagnosis of acute hypoxic respiratory failure secondary to acute on chronic diastolic heart failure.  86 year old female with past medical history for diastolic heart failure, chronic atrial fibrillation, pulmonary hypertension, breast cancer, carcinoid bronchial adenoma status post lobectomy, right upper extremity lymphedema, hypertension and GERD.  Patient reported generalized weakness cough dyspnea and worsening lower extremity edema for about 2 days.  Positive cough, and orthopnea.  On her initial physical examination blood pressure 118/67, 98/50, heart rate 78, respiratory 20, temperature 98.4, oxygen saturation 96%.  Pulmonary Rales bilateral bases, heart S1-S2, present rhythmic, abdomen distended, positive lower extremity edema. Sodium 118, potassium 3.8, chloride 82, bicarb 24, glucose 126, BUN 9, creatinine 0.5 white count 7.4, hemoglobin 11.3, hematocrit 34.3, platelets 196.  SARS COVID-19 negative.  Urinalysis specific gravity greater than 1.030, urine osmolality 285, urine sodium 15.  Chest radiograph with right rotation, positive right pleural effusion small to medium size.  EKG 71 bpm, normal axis, right bundle branch block, manually corrected QTC 480, atrial fibrillation rhythm, no significant ST segment or T wave changes.    Assessment & Plan:   Principal Problem:   Acute exacerbation of congestive heart failure (HCC) Active Problems:   Hyponatremia   Nausea   Acute heart failure (HCC)   Acute hypoxemic respiratory failure ([place])   1. Acute on chronic diastolic heart failure exacerbation. Documented urine output over last 24 H is 625 ml. Clinically her volume is stable.  Continue to hold on diuresis for now.   2. Hyponatremia/ SIADH. Patient received hypertonic saline overnight with improvement  of hyponatremia, this am at 124. Her urine osmolality is 232 and urine Na less than 10. Her volume seems to be euvolemic today.   Plan to check BMP at 17:00, continue close monitoring of Na, target increase 8 to 10 meq in 24 H.  Patient not tolerating urea po but able to take sodium tablets. Continue fluid restriction 1000 ml per 24 H.   3. Chronic atrial fibrillation. On carvedilol for rate control. Continue anticoagulation with apixaban.   4. Constipation. Positive bowel movements, will change to prn miralx.   Patient continue to be at high risk for worsening electrolyte abnormalities.    Status is: Inpatient  Remains inpatient appropriate because:Inpatient level of care appropriate due to severity of illness   Dispo: The patient is from: Home              Anticipated d/c is to: Home              Anticipated d/c date is: 2 days              Patient currently is not medically stable to d/c.   DVT prophylaxis: apixaban   Code Status:   dnr   Family Communication:  I spoke with patient's son at the bedside, we talked in detail about patient's condition, plan of care and prognosis and all questions were addressed.     Consultants:   Nephrology   P  Subjective: Patient is feeling better, but not yet back to baseline, continue to be very weak and deconditioned, no nausea or vomiting, no chest pain. Positive bowel movement.  Objective: Vitals:   06/04/20 1235 06/04/20 2034 06/05/20 0131 06/05/20 0350  BP: (!) 100/55 (!) 91/40 111/61 (!) 101/50  Pulse: 61 (!) 56  70 62  Resp: 18 18 18 18   Temp: 97.8 F (36.6 C) 98.5 F (36.9 C) 97.7 F (36.5 C) (!) 97.3 F (36.3 C)  TempSrc: Oral Oral Oral Oral  SpO2: 98% 96% 93% 92%  Weight:   64.8 kg   Height:        Intake/Output Summary (Last 24 hours) at 06/05/2020 0825 Last data filed at 06/05/2020 0357 Gross per 24 hour  Intake 720 ml  Output 625 ml  Net 95 ml   Filed Weights   06/03/20 0340 06/04/20 0012 06/05/20 0131    Weight: 62.7 kg 63.9 kg 64.8 kg    Examination:   General: Not in pain or dyspnea, deconditioned and ill looking appearing  Neurology: Awake and alert, non focal  E ENT: mild pallor, no icterus, oral mucosa moist Cardiovascular: No JVD. S1-S2 present, rhythmic, no gallops, rubs, or murmurs. ++ non pitting bilateral lower extremity edema. Pulmonary: positive breath sounds bilaterally, decreased breaths sounds at bases with no rhonchi or rales. Gastrointestinal. Abdomen soft and non tender Skin. No rashes Musculoskeletal: no joint deformities     Data Reviewed: I have personally reviewed following labs and imaging studies  CBC: Recent Labs  Lab 05/30/20 1249 06/03/20 0132 06/04/20 1520  WBC 7.4 8.2 9.5  NEUTROABS 6.5  --   --   HGB 11.3* 10.7* 11.2*  HCT 34.3* 32.6* 33.6*  MCV 93.7 93.7 93.6  PLT 196 189 914   Basic Metabolic Panel: Recent Labs  Lab 06/01/20 0958 06/01/20 1649 06/02/20 0117 06/02/20 0925 06/03/20 0132 06/03/20 1109 06/04/20 0237 06/04/20 0237 06/04/20 0804 06/04/20 1520 06/04/20 1904 06/04/20 2312 06/05/20 0552  NA 117*   < > 119*   < > 118*   < > 117*   < > 117* 115* 117* 116* 122*  K 4.0   < > 5.1   < > 4.5   < > 5.1   < > 4.7 4.9 4.9 5.2* 5.1  CL 80*   < > 81*   < > 84*   < > 84*   < > 81* 82* 80* 83* 87*  CO2 26   < > 27   < > 27   < > 18*   < > 26 21* 26 25 24   GLUCOSE 109*   < > 129*   < > 119*   < > 102*   < > 107* 141* 133* 141* 104*  BUN 11   < > 13   < > 22   < > 40*   < > 37* 54* 54* 57* 54*  CREATININE 0.56   < > 0.94   < > 1.14*   < > 1.15*   < > 1.16* 1.12* 1.28* 1.16* 1.06*  CALCIUM 8.3*   < > 8.4*   < > 8.2*   < > 8.8*   < > 8.8* 8.7* 9.4 8.7* 9.0  MG 1.7  --  1.5*  --  2.0  --  2.2  --   --   --   --   --   --   PHOS  --   --   --   --  3.5  --   --   --   --   --   --   --   --    < > = values in this interval not displayed.   GFR: Estimated Creatinine Clearance: 32.9 mL/min (A) (by C-G formula based on SCr of 1.06 mg/dL  (  H)). Liver Function Tests: Recent Labs  Lab 06/02/20 0117 06/03/20 0132  AST 32 23  ALT 17 17  ALKPHOS 67 59  BILITOT 1.7* 0.9  PROT 5.9* 5.3*  ALBUMIN 2.9* 2.5*   Recent Labs  Lab 06/02/20 0117  LIPASE 52*   No results for input(s): AMMONIA in the last 168 hours. Coagulation Profile: No results for input(s): INR, PROTIME in the last 168 hours. Cardiac Enzymes: Recent Labs  Lab 06/01/20 0958  CKTOTAL 53   BNP (last 3 results) No results for input(s): PROBNP in the last 8760 hours. HbA1C: No results for input(s): HGBA1C in the last 72 hours. CBG: No results for input(s): GLUCAP in the last 168 hours. Lipid Profile: No results for input(s): CHOL, HDL, LDLCALC, TRIG, CHOLHDL, LDLDIRECT in the last 72 hours. Thyroid Function Tests: Recent Labs    06/02/20 0925  TSH 1.293   Anemia Panel: Recent Labs    06/02/20 0925  VITAMINB12 1,179*  FOLATE 20.3  FERRITIN 227  TIBC 232*  IRON 25*  RETICCTPCT 2.8      Radiology Studies: I have reviewed all of the imaging during this hospital visit personally     Scheduled Meds: . apixaban  2.5 mg Oral BID  . calcium-vitamin D  1 tablet Oral Daily  . carvedilol  6.25 mg Oral BID WC  . cholecalciferol  400 Units Oral Daily  . feeding supplement (ENSURE ENLIVE)  237 mL Oral BID BM  . multivitamin with minerals  1 tablet Oral QPM  . pantoprazole  40 mg Oral Daily  . polyethylene glycol  17 g Oral Daily  . scopolamine  1 patch Transdermal Q72H  . sodium chloride flush  3 mL Intravenous Q12H  . Urea  15 g Oral BID   Continuous Infusions: . sodium chloride 250 mL (06/03/20 0658)     LOS: 4 days        Keondra Haydu Gerome Apley, MD

## 2020-06-05 NOTE — Progress Notes (Signed)
Nephrology Follow-Up Consult note   Assessment/Recommendations: [name] is a/an 86 y.o. female with a past medical history significant for diastolic heart failure, atrial fibrillation, pulmonary hypertension, lung cancer status post right lower lobectomy, admitted for shortness of breath complicated by hyponatremia.     # Hyponatremia: Likely secondary to heart failure based on previous evaluation. Urine sodium 15 and urine osmolality 285.  Cortisol and TSH unremarkable.  Repeat osmolality and sodium checks continue to be consistent with heart failure as the cause.  Failed tolvaptan and started hypertonic on 8/21.  Held at this time -Continue to monitor sodium every 6 hours -Goal correction is 123 x 5 p.m. today -If sodium is greater than 123 would consider the use of D5 water to lower to this level -Goal correction will then be 129 x 5 p.m. on 8/23 -Urea stopped and started on salt tabs per primary; would consider urea in the future given its optimal use in CHF associated hyponatremia -I will continue to monitor sodiums and restart hypertonic this afternoon if patient has not reached goal -Can continue salt tabs for now as primary team ordered in attempt to avoid ongoing hypertonic -Monitor urine output and intake -Consider CT chest to look for possible recurrent malignancy although SIADH seems less likely based on urine sodium alone  #Elevated creatinine: Creatinine fluctuating but slightly improved today to 1.06.  Likely cardiorenal syndrome.  Continue to monitor  # Acute on chronic diastolic CHF  -Mostly improved at this time. Holding furosemide - Please obtain strict ins/outs and daily weights   # HTN  -Blood pressure somewhat low with therapy directed at atrial fibrillation and heart failure.  Continue to monitor  # Hx lung cancer  - lung cancer s/p right lower lobectomy in 2017 - as above may need to consider CT chest if no improvement  # UTI: Enterococcus and E. Coli  isolated on culture.  No symptoms.  No treatment  # Permanent a fib - on beta blocker; rate control per primary team    Recommendations conveyed to primary service.    [facility] 06/05/2020 10:14 AM  ___________________________________________________________  CC: Hyponatremia  Interval History/Subjective: Started on hypertonic saline yesterday.  Sodium corrected to 122 this morning.  Urine sodium yesterday less than 10 continues to be consistent with heart failure associated hyponatremia.  Hypertonic stopped this morning by primary team.  Urea also appears to be stopped.  Salt tabs started by primary team.   Medications:  Current Facility-Administered Medications  Medication Dose Route Frequency Provider Last Rate Last Admin  . 0.9 %  sodium chloride infusion  250 mL Intravenous PRN Shela Leff, MD 10 mL/hr at 06/03/20 0658 250 mL at 06/03/20 0658  . acetaminophen (TYLENOL) tablet 650 mg  650 mg Oral Q4H PRN Shela Leff, MD   650 mg at 06/04/20 0358  . apixaban (ELIQUIS) tablet 2.5 mg  2.5 mg Oral BID Pokhrel, Laxman, MD   2.5 mg at 06/04/20 2333  . bisacodyl (DULCOLAX) EC tablet 5 mg  5 mg Oral Daily PRN Arrien, Jimmy Picket, MD      . bisacodyl (DULCOLAX) suppository 10 mg  10 mg Rectal Daily PRN Reesa Chew, MD   10 mg at 06/04/20 1001  . calcium-vitamin D (OSCAL WITH D) 500-200 MG-UNIT per tablet 1 tablet  1 tablet Oral Daily Pokhrel, Laxman, MD   1 tablet at 06/04/20 0901  . carvedilol (COREG) tablet 6.25 mg  6.25 mg Oral BID WC Pokhrel,  Laxman, MD   6.25 mg at 06/05/20 0703  . cholecalciferol (VITAMIN D3) tablet 400 Units  400 Units Oral Daily Donnamae Jude, RPH   400 Units at 06/04/20 0901  . feeding supplement (ENSURE ENLIVE) (ENSURE ENLIVE) liquid 237 mL  237 mL Oral BID BM Arrien, Jimmy Picket, MD   237 mL at 06/04/20 1709  . multivitamin with minerals tablet 1 tablet  1 tablet Oral QPM Pokhrel, Laxman, MD   1  tablet at 06/04/20 1709  . pantoprazole (PROTONIX) EC tablet 40 mg  40 mg Oral Daily Pokhrel, Laxman, MD   40 mg at 06/04/20 0901  . polyethylene glycol (MIRALAX / GLYCOLAX) packet 17 g  17 g Oral Daily Tawni Millers, MD   17 g at 06/04/20 1742  . polyvinyl alcohol (LIQUIFILM TEARS) 1.4 % ophthalmic solution 1 drop  1 drop Both Eyes PRN Blenda Nicely, RPH      . prochlorperazine (COMPAZINE) injection 5 mg  5 mg Intravenous Q6H PRN Shela Leff, MD      . scopolamine (TRANSDERM-SCOP) 1 MG/3DAYS 1.5 mg  1 patch Transdermal Q72H Shela Leff, MD   1.5 mg at 06/04/20 2335  . sodium chloride flush (NS) 0.9 % injection 3 mL  3 mL Intravenous Q12H Shela Leff, MD   3 mL at 06/04/20 2341  . sodium chloride flush (NS) 0.9 % injection 3 mL  3 mL Intravenous PRN Shela Leff, MD      . sodium chloride tablet 1 g  1 g Oral TID WC Arrien, Jimmy Picket, MD          Review of Systems: 10 systems reviewed and negative except per interval history/subjective  Physical Exam: Vitals:   06/05/20 0350 06/05/20 0855  BP: (!) 101/50 (!) 96/54  Pulse: 62 65  Resp: 18   Temp: (!) 97.3 F (36.3 C) 98.5 F (36.9 C)  SpO2: 92% 100%   Total I/O In: 240 [P.O.:240] Out: -   Intake/Output Summary (Last 24 hours) at 06/05/2020 1014 Last data filed at 06/05/2020 0900 Gross per 24 hour  Intake 720 ml  Output 625 ml  Net 95 ml   Constitutional: Elderly, no acute distress ENMT: ears and nose without scars or lesions, MMM CV: normal rate, nonpitting edema present in the bilateral lower extremities Respiratory: Bilateral chest rise, normal work of breathing Gastrointestinal: Mild distention, pain with palpation diffusely Skin: no visible lesions or rashes Psych: alert, judgement/insight appropriate, appropriate mood and affect   Test Results I personally reviewed new and old clinical labs and radiology tests Lab Results  Component Value Date   NA 122 (L) 06/05/2020    K 5.1 06/05/2020   CL 87 (L) 06/05/2020   CO2 24 06/05/2020   BUN 54 (H) 06/05/2020   CREATININE 1.06 (H) 06/05/2020   CALCIUM 9.0 06/05/2020   ALBUMIN 2.5 (L) 06/03/2020   PHOS 3.5 06/03/2020

## 2020-06-05 NOTE — TOC Progression Note (Addendum)
Transition of Care [facility]) - Progression Note    Patient Details  Name: [name] MRN: [id number] Date of Birth: 10-23-1933  Transition of Care [facility] CM/SW [place], [place] Phone Number: 06/05/2020, 12:21 PM  Clinical Narrative:     8/22 3:12pm-CSW received insurance authorization for patient. Occupational hygienist number is # [id number] [place] [id number]. Next review date is 8/25.  Patient has SNF bed at [facility]. Insurance authorization has been approved.  CSW will continue to follow.   CSW spoke with [facility] and they are still reviewing clinicals to determine insurance approval. [employer] will call CSW back after they review clinicals.   Pending insurance authorization.  CSW will continue to follow  Expected Discharge Plan: [place] Barriers to Discharge: Continued Medical Work up, Ship broker  Expected Discharge Plan and Services Expected Discharge Plan: [place] Choice: [place] arrangements for the past 2 months: [place]                                       Social Determinants of Health (SDOH) Interventions    Readmission Risk Interventions No flowsheet data found.

## 2020-06-06 LAB — BASIC METABOLIC PANEL
Anion gap: 10 (ref 5–15)
BUN: 36 mg/dL — ABNORMAL HIGH (ref 8–23)
CO2: 30 mmol/L (ref 22–32)
Calcium: 9.3 mg/dL (ref 8.9–10.3)
Chloride: 93 mmol/L — ABNORMAL LOW (ref 98–111)
Creatinine, Ser: 0.99 mg/dL (ref 0.44–1.00)
GFR calc Af Amer: >60 mL/min (ref >60)
GFR calc non Af Amer: 53 mL/min — ABNORMAL LOW (ref >60)
Glucose, Bld: 115 mg/dL — ABNORMAL HIGH (ref 70–99)
Potassium: 4.7 mmol/L (ref 3.5–5.1)
Sodium: 133 mmol/L — ABNORMAL LOW (ref 135–145)

## 2020-06-06 LAB — SODIUM: Sodium: 129 mmol/L — ABNORMAL LOW (ref 135–145)

## 2020-06-06 MED ORDER — FOSFOMYCIN TROMETHAMINE 3 G PO PACK
3.0000 g | PACK | Freq: Once | ORAL | Status: AC
  Administered 2020-06-06: 3 g via ORAL
  Filled 2020-06-06: qty 3

## 2020-06-06 MED ORDER — DEXTROSE 5 % IV SOLN: INTRAVENOUS | Status: DC

## 2020-06-06 MED ORDER — SODIUM POLYSTYRENE SULFONATE 15 GM/60ML PO SUSP
30.0000 g | Freq: Once | ORAL | Status: AC
  Administered 2020-06-06: 30 g via ORAL
  Filled 2020-06-06: qty 120

## 2020-06-06 MED ORDER — SIMETHICONE 80 MG PO CHEW
80.0000 mg | CHEWABLE_TABLET | Freq: Two times a day (BID) | ORAL | Status: DC | PRN
  Administered 2020-06-06: 80 mg via ORAL
  Filled 2020-06-06: qty 1

## 2020-06-06 NOTE — Progress Notes (Signed)
Physical Therapy Treatment Patient Details Name: [name] MRN: [id number] DOB: 1934-01-07 Today's Date: 06/06/2020    History of Present Illness [name] is a 86 y.o. female with medical history significant of chronic diastolic CHF, permanent A. 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     PT Comments    Pt admitted with above diagnosis. Pt was able to ambulate with RW with min guard assist with good safety overall. Pt Did need O2 at 2L with activity.  See below. Pt currently with functional limitations due to balance and endurance deficits. Pt will benefit from skilled PT to increase their independence and safety with mobility to allow discharge to the venue listed below.    91% on RA rest Supine BP 104/66, 58 bpm Sitting 113/61, 68 bpm Desat to 86% on RA with activity Pt O2 on 2L with activity was 94%.    Follow Up Recommendations  SNF;Supervision/Assistance - 24 hour     Equipment Recommendations  Rolling walker with 5" wheels    Recommendations for Other Services       Precautions / Restrictions Precautions Precautions: Fall Restrictions Weight Bearing Restrictions: No    Mobility  Bed Mobility Overal bed mobility: Needs Assistance Bed Mobility: Supine to Sit;Sit to Supine     Supine to sit: Min guard     General bed mobility comments: No assist needed.   Transfers Overall transfer level: Needs assistance Equipment used: Rolling walker (2 wheeled);1 person hand held assist Transfers: Sit to/from [facility] Sit to Stand: Min guard         General transfer comment: Min guard assist  with cues for hand placement only.  Ambulation/Gait Ambulation/Gait assistance: Min guard Gait Distance (Feet): 125 Feet Assistive device: Rolling walker (2 wheeled) Gait Pattern/deviations: Step-through pattern;Decreased stride length;Trunk flexed;Wide base of support   Gait velocity interpretation: <1.31 ft/sec, indicative of household ambulator General Gait Details: PT min guard assist with  RW wtih fair safety.  Fatigues quickly but did incr distance today.  Did need O2 with ambulation.  See ABove.    Stairs             Wheelchair Mobility    Modified Rankin (Stroke Patients Only)       Balance Overall balance assessment: Needs assistance;History of Falls Sitting-balance support: Feet supported;No upper extremity supported Sitting balance-[name]: Fair     Standing balance support: Bilateral upper extremity supported;During functional activity Standing balance-[name]: Poor Standing balance comment: Needs UE support on RW                             Cognition Arousal/Alertness: Awake/alert Behavior During Therapy: Flat affect Overall Cognitive Status: Within Functional Limits for tasks assessed                                        Exercises      General Comments        Pertinent Vitals/Pain Pain Assessment: No/denies pain    Home Living  Prior Function            PT Goals (current goals can now be found in the care plan section) Acute Rehab PT Goals Patient Stated Goal: regain independence and strength Progress towards PT goals: Progressing toward goals    Frequency    Min 3X/week      PT Plan Current plan remains appropriate    Co-evaluation              AM-PAC PT "6 Clicks" Mobility   Outcome Measure  Help needed turning from your back to your side while in a flat bed without using bedrails?: A Little Help needed moving from lying on your back to sitting on  the side of a flat bed without using bedrails?: A Little Help needed moving to and from a bed to a chair (including a wheelchair)?: A Little Help needed standing up from a chair using your arms (e.g., wheelchair or bedside chair)?: A Little Help needed to walk in hospital room?: A Little Help needed climbing 3-5 steps with a railing? : A Little 6 Click Score: 18    End of Session Equipment Utilized During Treatment: Gait belt;Oxygen Activity Tolerance: Patient limited by fatigue Patient left: with call bell/phone within reach;in chair;with chair alarm set Nurse Communication: Mobility status PT Visit Diagnosis: Unsteadiness on feet (R26.81);Muscle weakness (generalized) (M62.81)     Time: 7544-9201 PT Time Calculation (min) (ACUTE ONLY): 36 min  Charges:  $Gait Training: 23-37 mins                     Tram Wrenn W,PT [place] Pager:  [phone]  Office:  [place] 06/06/2020, 11:00 AM

## 2020-06-06 NOTE — Progress Notes (Signed)
[facility] Progress Note   [name] is a/an 86 y.o. female with a past medical history significant for diastolic heart failure, atrial fibrillation, pulmonary hypertension, lung cancer status post right lower lobectomy, admitted for shortness of breath complicated by hyponatremia.   Assessment/ Plan:   1. Hyponatremia - Patient's Na goal was 124 yesterday evening, but elevated to 133. D5W infusion started this morning. Will recheck serum Na this afternoon at 3 pm. Goal Na for today is 130.  - Continue D5W 50cc/hr for 5 hours  - Recheck serum Na this afternoon.   2. Acute on Chronic diastolic CHF: - Strict I&O  - Fluid restricted diet - Hold furosemide.   3. HTN - Patient is normotensive today with elevated pulse pressure does have evidence of mild aortic regurg on recent echo and advanced aortic calcifications on recent CT. - Continue to monitor.   4. Hx of lung cancer - chest CT showed chronic Rt loculated pleural effusion, stable scattered pulmonary nodules. No adenopathy   5. Asymptomatic bacteruria: - Continue to monitor.   6. Permanent A fib: - Rate control with Beta blocker per primary team.   Subjective:    Patient doing well today. She is asymptomatic from her changes in serum Na.    Objective:   BP (!) 122/55 (BP Location: Right Arm)    Pulse 76    Temp 98.8 F (37.1 C) (Oral)    Resp 18    Ht 4\' 11"  (1.499 m)    Wt 64.4 kg Comment: scale b   LMP 10/16/1975 (Approximate)    SpO2 97%    BMI 28.66 kg/m   Intake/Output Summary (Last 24 hours) at 06/06/2020 0942 Last data filed at 06/06/2020 0858 Gross per 24 hour  Intake 1080 ml  Output 1650 ml  Net -570 ml   Weight change: -0.408 kg  Physical Exam: Physical Exam Constitutional:      Appearance: Normal appearance.  HENT:     Head: Normocephalic and atraumatic.  Eyes:     Extraocular Movements: Extraocular movements intact.  Cardiovascular:     Rate and Rhythm: Normal rate.     Pulses:  Normal pulses.     Heart sounds: Normal heart sounds.  Pulmonary:     Effort: Pulmonary effort is normal.     Breath sounds: Normal breath sounds.  Abdominal:     General: Bowel sounds are normal.     Palpations: Abdomen is soft.     Tenderness: There is abdominal tenderness (minimal diffuse tinderness).  Musculoskeletal:        General: Normal range of motion.     Cervical back: Normal range of motion.     Right lower leg: Edema present.     Left lower leg: Edema present.  Skin:    General: Skin is warm and dry.  Neurological:     Mental Status: She is alert and oriented to person, place, and time. Mental status is at baseline.  Psychiatric:        Mood and Affect: Mood normal.      Imaging: [place] EKG SITE RITE  Result Date: 06/04/2020 If Site Rite image not attached, placement could not be confirmed due to current cardiac rhythm.   Labs: BMET Recent Labs  Lab 06/03/20 0132 06/03/20 1109 06/04/20 1520 06/04/20 1520 06/04/20 1904 06/04/20 2312 06/05/20 0552 06/05/20 1051 06/05/20 1614 06/05/20 2203 06/06/20 0755  NA 118*   < > 115*   < > 117* 116* 122* 124*  127* 129* 133*  K 4.5   < > 4.9  --  4.9 5.2* 5.1  --  5.9* 5.8* 4.7  CL 84*   < > 82*  --  80* 83* 87*  --  93* 93* 93*  CO2 27   < > 21*  --  26 25 24   --  17* 26 30  GLUCOSE 119*   < > 141*  --  133* 141* 104*  --  121* 138* 115*  BUN 22   < > 54*  --  54* 57* 54*  --  53* 49* 36*  CREATININE 1.14*   < > 1.12*  --  1.28* 1.16* 1.06*  --  1.12* 1.17* 0.99  CALCIUM 8.2*   < > 8.7*  --  9.4 8.7* 9.0  --  8.8* 9.2 9.3  PHOS 3.5  --   --   --   --   --   --   --   --   --   --    < > = values in this interval not displayed.   CBC Recent Labs  Lab 05/30/20 1249 06/03/20 0132 06/04/20 1520  WBC 7.4 8.2 9.5  NEUTROABS 6.5  --   --   HGB 11.3* 10.7* 11.2*  HCT 34.3* 32.6* 33.6*  MCV 93.7 93.7 93.6  PLT 196 189 219    Medications:     apixaban  2.5 mg Oral BID   calcium-vitamin D  1 tablet Oral  Daily   carvedilol  6.25 mg Oral BID WC   cholecalciferol  400 Units Oral Daily   feeding supplement (ENSURE ENLIVE)  237 mL Oral BID BM   pantoprazole  40 mg Oral Daily   scopolamine  1 patch Transdermal Q72H      Marianna Payment, D.O. [facility], PGY-2 Pager: [phone], Phone: [phone] Date 06/06/2020 Time 10:21 AM

## 2020-06-06 NOTE — Progress Notes (Signed)
SATURATION QUALIFICATIONS: (This note is used to comply with regulatory documentation for home oxygen)  Patient Saturations on Room Air at Rest = 91%  Patient Saturations on Room Air while Ambulating = 86%  Patient Saturations on 2 Liters of oxygen while Ambulating = 94%  Please briefly explain why patient needs home oxygen:Pt needed 2LO2 with activity to maintain sats >90%.   Rana Hochstein W,PT Acute Rehabilitation Services Pager:  [phone]  Office:  [phone]

## 2020-06-06 NOTE — Progress Notes (Signed)
Afternoon Na level 129. Dc dextrose IV.  Repeat lab in am.  Katheran James, MD [facility].

## 2020-06-06 NOTE — Progress Notes (Signed)
PROGRESS NOTE    [name]  WER:[id number]  DOB: 02/19/1934  DOA: 05/30/2020 PCP: Marton Redwood, MD Outpatient Specialists:   Hospital course:  86 year old female with HFpEF, PAF, SIADH, pulmonary hypertension, ocular adenoma status post lobectomy was admitted on 05/30/2020 with shortness of breath thought to be secondary to decompensated heart failure.  On examination she was noted to have significant hyponatremia to 118.  Urine osmolality and urine sodium was consistent with decompensated heart failure/intravascular volume depletion with avid sodium retention state.  Nephrology has been following.  Subjective:  Patient was seen in conjunction with her attentive son who is at bedside.  Patient herself has no acute complaints other than being bothered by the multiple blood draws due to her hyponatremia.  Denies any shortness of breath or chest pain.  Denies confusion.  Notes she has been able to walk to the bathroom without difficulty.  Patient denies any dysuria or abdominal pain.   Objective: Vitals:   06/05/20 1950 06/06/20 0337 06/06/20 1105 06/06/20 1648  BP: (!) 98/45 (!) 122/55 108/63 (!) 123/59  Pulse: (!) 56 76 (!) 53 (!) 57  Resp: 18 18 17 16   Temp: 98.9 F (37.2 C) 98.8 F (37.1 C) 98.3 F (36.8 C) (!) 97.4 F (36.3 C)  TempSrc: Oral Oral Oral Oral  SpO2: 94% 97% 99% 100%  Weight:  64.4 kg    Height:        Intake/Output Summary (Last 24 hours) at 06/06/2020 1755 Last data filed at 06/06/2020 1400 Gross per 24 hour  Intake 1061.67 ml  Output 1450 ml  Net -388.33 ml   Filed Weights   06/04/20 0012 06/05/20 0131 06/06/20 0337  Weight: 63.9 kg 64.8 kg 64.4 kg     Exam:  General: Elderly patient looking stated age with attentive son at bedside in no acute distress. Eyes: sclera anicteric, conjuctiva mild injection bilaterally CVS: S1-S2, regular  Respiratory:  decreased air entry bilaterally secondary to decreased inspiratory effort, rales at bases    GI: NABS, soft, NT  LE: No edema.  Neuro: A/O x 3, Moving all extremities equally with normal strength, CN 3-12 intact, grossly nonfocal.  Psych: patient is logical and coherent, judgement and insight appear normal, mood and affect appropriate to situation.   Assessment & Plan:   Hyponatremia Much appreciate ongoing renal consultation Sodium was initially aggressively repleted was 133 this morning however is back to 129 this afternoon. Will decrease the number of blood draws as much as possible.  Acute decompensation of HFpEF Had previously been diuresing well however her diuresis is presently on hold due to hypotension. Patient denies any shortness of breath and is oxygenating well  Atrial fibrillation Rate is well controlled on carvedilol Secondary stroke prevention with apixaban  UTI Patient was noted to have an abnormal UA with greater than 100 K E. coli and 50 KE faecalis. Patient states she does have asymptomatic bacteriuria at baseline Faecalis came back as resistant to vancomycin She is asymptomatic Discussed with ID, will treat with 3 g fosfomycin x1 today   DVT prophylaxis: Apixaban Code Status: DNR Family Communication: Son was at bedside Disposition Plan:   Patient is from: Home  Anticipated Discharge Location: Home  Barriers to Discharge: Hyponatremia  Is patient medically stable for Discharge: Not yet   Consultants:  Nephrology  Procedures:  N/A  Antimicrobials:  Patient completed a course of ceftriaxone  Fosfomycin today   Data Reviewed:  Basic Metabolic Panel: Recent Labs  Lab 06/01/20 [phone]  06/01/20 1649 06/02/20 0117 06/02/20 0925 06/03/20 0132 06/03/20 1109 06/04/20 0237 06/04/20 0804 06/04/20 2312 06/04/20 2312 06/05/20 0552 06/05/20 0552 06/05/20 1051 06/05/20 1614 06/05/20 2203 06/06/20 0755 06/06/20 1543  NA 117*   < > 119*   < > 118*   < > 117*   < > 116*   < > 122*   < > 124* 127* 129* 133* 129*  K 4.0   < > 5.1    < > 4.5   < > 5.1   < > 5.2*  --  5.1  --   --  5.9* 5.8* 4.7  --   CL 80*   < > 81*   < > 84*   < > 84*   < > 83*  --  87*  --   --  93* 93* 93*  --   CO2 26   < > 27   < > 27   < > 18*   < > 25  --  24  --   --  17* 26 30  --   GLUCOSE 109*   < > 129*   < > 119*   < > 102*   < > 141*  --  104*  --   --  121* 138* 115*  --   BUN 11   < > 13   < > 22   < > 40*   < > 57*  --  54*  --   --  53* 49* 36*  --   CREATININE 0.56   < > 0.94   < > 1.14*   < > 1.15*   < > 1.16*  --  1.06*  --   --  1.12* 1.17* 0.99  --   CALCIUM 8.3*   < > 8.4*   < > 8.2*   < > 8.8*   < > 8.7*  --  9.0  --   --  8.8* 9.2 9.3  --   MG 1.7  --  1.5*  --  2.0  --  2.2  --   --   --   --   --   --   --   --   --   --   PHOS  --   --   --   --  3.5  --   --   --   --   --   --   --   --   --   --   --   --    < > = values in this interval not displayed.   Liver Function Tests: Recent Labs  Lab 06/02/20 0117 06/03/20 0132  AST 32 23  ALT 17 17  ALKPHOS 67 59  BILITOT 1.7* 0.9  PROT 5.9* 5.3*  ALBUMIN 2.9* 2.5*   Recent Labs  Lab 06/02/20 0117  LIPASE 52*   No results for input(s): AMMONIA in the last 168 hours. CBC: Recent Labs  Lab 06/03/20 0132 06/04/20 1520  WBC 8.2 9.5  HGB 10.7* 11.2*  HCT 32.6* 33.6*  MCV 93.7 93.6  PLT 189 219   Cardiac Enzymes: Recent Labs  Lab 06/01/20 0958  CKTOTAL 53   BNP (last 3 results) No results for input(s): PROBNP in the last 8760 hours. CBG: No results for input(s): GLUCAP in the last 168 hours.  Recent Results (from the past 240 hour(s))  SARS Coronavirus 2 by RT PCR (hospital order, performed in [facility]  hospital lab) Nasopharyngeal Nasopharyngeal Swab     Status: None   Collection Time: 05/30/20  1:35 PM   Specimen: Nasopharyngeal Swab  Result Value Ref Range Status   SARS Coronavirus 2 NEGATIVE NEGATIVE Final    Comment: (NOTE) SARS-CoV-2 target nucleic acids are NOT DETECTED.  The SARS-CoV-2 RNA is generally detectable in upper and  lower respiratory specimens during the acute phase of infection. The lowest concentration of SARS-CoV-2 viral copies this assay can detect is 250 copies / mL. A negative result does not preclude SARS-CoV-2 infection and should not be used as the sole basis for treatment or other patient management decisions.  A negative result may occur with improper specimen collection / handling, submission of specimen other than nasopharyngeal swab, presence of viral mutation(s) within the areas targeted by this assay, and inadequate number of viral copies (<250 copies / mL). A negative result must be combined with clinical observations, patient history, and epidemiological information.  Fact Sheet for Patients:   StrictlyIdeas.no  Fact Sheet for Healthcare Providers: BankingDealers.co.za  This test is not yet approved or  cleared by the [place] FDA and has been authorized for detection and/or diagnosis of SARS-CoV-2 by FDA under an Emergency Use Authorization (EUA).  This EUA will remain in effect (meaning this test can be used) for the duration of the COVID-19 declaration under Section 564(b)(1) of the Act, 21 U.S.C. section 360bbb-3(b)(1), unless the authorization is terminated or revoked sooner.  Performed at [facility], [address] [place], [place] [address]   Urine culture     Status: Abnormal   Collection Time: 05/31/20  8:00 PM   Specimen: Urine, Random  Result Value Ref Range Status   Specimen Description   Final    URINE, RANDOM Performed at [facility], [address] [place], [place] [address]    Special Requests   Final    NONE Performed at [facility], [address] [place], [place] [address]    Culture MULTIPLE SPECIES PRESENT, SUGGEST RECOLLECTION (A)  Final   Report Status 06/02/2020 FINAL  Final  Urine culture     Status: Abnormal   Collection Time: 06/01/20  5:15 AM    Specimen: Urine, Clean Catch  Result Value Ref Range Status   Specimen Description   Final    URINE, CLEAN CATCH Performed at [facility], [address] [place], [place] [address]    Special Requests   Final    NONE Performed at [facility], [address] [place], [place] [address]    Culture (A)  Final    >=100,000 COLONIES/mL ESCHERICHIA COLI 50,000 COLONIES/mL ENTEROCOCCUS FAECALIS VANCOMYCIN RESISTANT ENTEROCOCCUS ISOLATED    Report Status 06/03/2020 FINAL  Final   Organism ID, Bacteria ESCHERICHIA COLI (A)  Final   Organism ID, Bacteria ENTEROCOCCUS FAECALIS (A)  Final      Susceptibility   Escherichia coli - MIC*    AMPICILLIN <=2 SENSITIVE Sensitive     CEFAZOLIN <=4 SENSITIVE Sensitive     CEFTRIAXONE <=0.25 SENSITIVE Sensitive     CIPROFLOXACIN <=0.25 SENSITIVE Sensitive     GENTAMICIN <=1 SENSITIVE Sensitive     IMIPENEM <=0.25 SENSITIVE Sensitive     NITROFURANTOIN <=16 SENSITIVE Sensitive     TRIMETH/SULFA <=20 SENSITIVE Sensitive     AMPICILLIN/SULBACTAM <=2 SENSITIVE Sensitive     PIP/TAZO <=4 SENSITIVE Sensitive     * >=100,000 COLONIES/mL ESCHERICHIA COLI  Enterococcus faecalis - MIC*    AMPICILLIN <=2 SENSITIVE Sensitive     NITROFURANTOIN <=16 SENSITIVE Sensitive     VANCOMYCIN >=32 RESISTANT Resistant     * 50,000 COLONIES/mL ENTEROCOCCUS FAECALIS      Studies: No results found.   Scheduled Meds: . apixaban  2.5 mg Oral BID  . calcium-vitamin D  1 tablet Oral Daily  . carvedilol  6.25 mg Oral BID WC  . cholecalciferol  400 Units Oral Daily  . feeding supplement (ENSURE ENLIVE)  237 mL Oral BID BM  . pantoprazole  40 mg Oral Daily  . scopolamine  1 patch Transdermal Q72H   Continuous Infusions:  Principal Problem:   Acute exacerbation of congestive heart failure (HCC) Active Problems:   Hyponatremia   Nausea   Acute heart failure ([place])   Acute hypoxemic respiratory failure ([place])     Alexys Lobello Tublu  Caroleen Stoermer, [facility]  If 7PM-7AM, please contact night-coverage [email or web address] Password [facility] 06/06/2020, 5:55 PM    LOS: 5 days

## 2020-06-07 LAB — RENAL FUNCTION PANEL
Albumin: 2.6 g/dL — ABNORMAL LOW (ref 3.5–5.0)
Anion gap: 10 (ref 5–15)
BUN: 27 mg/dL — ABNORMAL HIGH (ref 8–23)
CO2: 29 mmol/L (ref 22–32)
Calcium: 8.9 mg/dL (ref 8.9–10.3)
Chloride: 91 mmol/L — ABNORMAL LOW (ref 98–111)
Creatinine, Ser: 0.87 mg/dL (ref 0.44–1.00)
GFR calc Af Amer: >60 mL/min (ref >60)
GFR calc non Af Amer: >60 mL/min (ref >60)
Glucose, Bld: 102 mg/dL — ABNORMAL HIGH (ref 70–99)
Phosphorus: 4 mg/dL (ref 2.5–4.6)
Potassium: 5 mmol/L (ref 3.5–5.1)
Sodium: 130 mmol/L — ABNORMAL LOW (ref 135–145)

## 2020-06-07 LAB — SARS CORONAVIRUS 2 BY RT PCR (HOSPITAL ORDER, PERFORMED IN ~~LOC~~ HOSPITAL LAB): SARS Coronavirus 2: NEGATIVE

## 2020-06-07 MED ORDER — SIMETHICONE 80 MG PO CHEW
80.0000 mg | CHEWABLE_TABLET | Freq: Four times a day (QID) | ORAL | Status: DC | PRN
  Administered 2020-06-07: 80 mg via ORAL
  Filled 2020-06-07 (×2): qty 1

## 2020-06-07 MED ORDER — ALUM & MAG HYDROXIDE-SIMETH 200-200-20 MG/5ML PO SUSP: 30.0000 mL | Freq: Four times a day (QID) | ORAL | Status: DC | PRN

## 2020-06-07 MED ORDER — FUROSEMIDE 20 MG PO TABS
20.0000 mg | ORAL_TABLET | Freq: Every day | ORAL | Status: DC
  Administered 2020-06-07 – 2020-06-08 (×2): 20 mg via ORAL
  Filled 2020-06-07 (×2): qty 1

## 2020-06-07 MED ORDER — APIXABAN 5 MG PO TABS
5.0000 mg | ORAL_TABLET | Freq: Two times a day (BID) | ORAL | Status: DC
  Administered 2020-06-07 – 2020-06-08 (×2): 5 mg via ORAL
  Filled 2020-06-07 (×2): qty 1

## 2020-06-07 NOTE — Social Work (Signed)
8/24: Spoke with Katie @[facility], she stated they do have a rehab bed available for Pt once she is medically stable.  Insurance auth good through 8/25.

## 2020-06-07 NOTE — Progress Notes (Signed)
PROGRESS NOTE    [name]  WNI:[id number]  DOB: [birth year]  DOA: 05/30/2020 PCP: Marton Redwood, MD Outpatient Specialists:   Hospital course:  86 year old female with HFpEF, PAF, SIADH, pulmonary hypertension, ocular adenoma status post lobectomy was admitted on 05/30/2020 with shortness of breath thought to be secondary to decompensated heart failure.  On examination she was noted to have significant hyponatremia to 118.  Urine osmolality and urine sodium was consistent with decompensated heart failure/intravascular volume depletion with avid sodium retention state.  Nephrology has been following.  Subjective:  Patient states that she is feeling okay. Notes she is worried about going back to her assisted living facility as she is still unable to bathe and dress herself without assistance. She would like to go to rehab at [facility] which is where she lives in assisted living. Notes she feels quite weak although she was able to walk down the hallway without oxygen without difficulty yesterday and today.  Patient son was at bedside throughout and he agrees. He notes that they were able to pay for rehab if warranted. They would like for [facility] to [facility] to [facility] to [facility] her strength before she goes back to assisted living.   Objective: Vitals:   06/07/20 0419 06/07/20 0755 06/07/20 1142 06/07/20 1500  BP: 128/64 (!) 115/52 (!) 107/57 116/71  Pulse: 72 65 71 77  Resp: 18 17 16 16   Temp: 98 F (36.7 C) 98.8 F (37.1 C) 98.2 F (36.8 C) 98.9 F (37.2 C)  TempSrc: Oral Oral Oral Oral  SpO2: 98% 92% 97% 92%  Weight:      Height:        Intake/Output Summary (Last 24 hours) at 06/07/2020 1846 Last data filed at 06/07/2020 1330 Gross per 24 hour  Intake 1320 ml  Output 400 ml  Net 920 ml   Filed Weights   06/05/20 0131 06/06/20 0337 06/07/20 0200  Weight: 64.8 kg 64.4 kg 65.5 kg     Exam:  General: Elderly patient looking stated age with attentive son at bedside  in no acute distress. Eyes: sclera anicteric, conjuctiva mild injection bilaterally CVS: S1-S2, regular  Respiratory:  decreased air entry bilaterally secondary to decreased inspiratory effort, rales at bases  GI: NABS, soft, NT  LE: No edema.  Neuro: A/O x 3, Moving all extremities equally with normal strength, CN 3-12 intact, grossly nonfocal.  Psych: patient is logical and coherent, judgement and insight appear normal, mood and affect appropriate to situation.   Assessment & Plan:   Hyponatremia Sodium stable at 130 today Discussed with renal and with family  Re disposition. Will keep patient for one more day and start oral Lasix. If sodium remains acceptable, patient is to discharge [facility] to [facility] if a bed is available as noted above. Much appreciate ongoing renal consultation  Acute decompensation of HFpEF Patient has been started back on Lasix 20 mg orally daily Patient denies any shortness of breath and is oxygenating well on room air even with ambulation.  Atrial fibrillation Rate is well controlled on carvedilol Secondary stroke prevention with apixaban  UTI Patient was noted to have an abnormal UA with greater than 100 K E. coli and 50 KE faecalis. Patient states she does have asymptomatic bacteriuria at baseline E Faecalis came back as resistant to vancomycin She is asymptomatic Discussed with ID, she was treated with 3 g fosfomycin x1    DVT prophylaxis: Apixaban Code Status: DNR Family Communication: Son was at  bedside Disposition Plan:   Patient is from: Home  Anticipated Discharge Location: Home  Barriers to Discharge: Hyponatremia  Is patient medically stable for Discharge: Not yet   Consultants:  Nephrology  Procedures:  N/A  Antimicrobials:  Patient completed a course of ceftriaxone  Fosfomycin today   Data Reviewed:  Basic Metabolic Panel: Recent Labs  Lab 06/01/20 0958 06/01/20 1649 06/02/20 0117 06/02/20 0925  06/03/20 0132 06/03/20 1109 06/04/20 0237 06/04/20 0804 06/05/20 0552 06/05/20 1051 06/05/20 1614 06/05/20 2203 06/06/20 0755 06/06/20 1543 06/07/20 1020  NA 117*   < > 119*   < > 118*   < > 117*   < > 122*   < > 127* 129* 133* 129* 130*  K 4.0   < > 5.1   < > 4.5   < > 5.1   < > 5.1  --  5.9* 5.8* 4.7  --  5.0  CL 80*   < > 81*   < > 84*   < > 84*   < > 87*  --  93* 93* 93*  --  91*  CO2 26   < > 27   < > 27   < > 18*   < > 24  --  17* 26 30  --  29  GLUCOSE 109*   < > 129*   < > 119*   < > 102*   < > 104*  --  121* 138* 115*  --  102*  BUN 11   < > 13   < > 22   < > 40*   < > 54*  --  53* 49* 36*  --  27*  CREATININE 0.56   < > 0.94   < > 1.14*   < > 1.15*   < > 1.06*  --  1.12* 1.17* 0.99  --  0.87  CALCIUM 8.3*   < > 8.4*   < > 8.2*   < > 8.8*   < > 9.0  --  8.8* 9.2 9.3  --  8.9  MG 1.7  --  1.5*  --  2.0  --  2.2  --   --   --   --   --   --   --   --   PHOS  --   --   --   --  3.5  --   --   --   --   --   --   --   --   --  4.0   < > = values in this interval not displayed.   Liver Function Tests: Recent Labs  Lab 06/02/20 0117 06/03/20 0132 06/07/20 1020  AST 32 23  --   ALT 17 17  --   ALKPHOS 67 59  --   BILITOT 1.7* 0.9  --   PROT 5.9* 5.3*  --   ALBUMIN 2.9* 2.5* 2.6*   Recent Labs  Lab 06/02/20 0117  LIPASE 52*   No results for input(s): AMMONIA in the last 168 hours. CBC: Recent Labs  Lab 06/03/20 0132 06/04/20 1520  WBC 8.2 9.5  HGB 10.7* 11.2*  HCT 32.6* 33.6*  MCV 93.7 93.6  PLT 189 219   Cardiac Enzymes: Recent Labs  Lab 06/01/20 0958  CKTOTAL 53   BNP (last 3 results) No results for input(s): PROBNP in the last 8760 hours. CBG: No results for input(s): GLUCAP in the last 168 hours.  Recent Results (from the  past 240 hour(s))  SARS Coronavirus 2 by RT PCR (hospital order, performed in [facility] hospital lab) Nasopharyngeal Nasopharyngeal Swab     Status: None   Collection Time: 05/30/20  1:35 PM   Specimen: Nasopharyngeal Swab    Result Value Ref Range Status   SARS Coronavirus 2 NEGATIVE NEGATIVE Final    Comment: (NOTE) SARS-CoV-2 target nucleic acids are NOT DETECTED.  The SARS-CoV-2 RNA is generally detectable in upper and lower respiratory specimens during the acute phase of infection. The lowest concentration of SARS-CoV-2 viral copies this assay can detect is 250 copies / mL. A negative result does not preclude SARS-CoV-2 infection and should not be used as the sole basis for treatment or other patient management decisions.  A negative result may occur with improper specimen collection / handling, submission of specimen other than nasopharyngeal swab, presence of viral mutation(s) within the areas targeted by this assay, and inadequate number of viral copies (<250 copies / mL). A negative result must be combined with clinical observations, patient history, and epidemiological information.  Fact Sheet for Patients:   StrictlyIdeas.no  Fact Sheet for Healthcare Providers: BankingDealers.co.za  This test is not yet approved or  cleared by the [place] FDA and has been authorized for detection and/or diagnosis of SARS-CoV-2 by FDA under an Emergency Use Authorization (EUA).  This EUA will remain in effect (meaning this test can be used) for the duration of the COVID-19 declaration under Section 564(b)(1) of the Act, 21 U.S.C. section 360bbb-3(b)(1), unless the authorization is terminated or revoked sooner.  Performed at [facility], [address] [place], [place] [address]   Urine culture     Status: Abnormal   Collection Time: 05/31/20  8:00 PM   Specimen: Urine, Random  Result Value Ref Range Status   Specimen Description   Final    URINE, RANDOM Performed at [facility], [address] [place], [place] [address]    Special Requests   Final    NONE Performed at [facility], [address] [place], [place] [address]    Culture MULTIPLE SPECIES PRESENT, SUGGEST RECOLLECTION (A)  Final   Report Status 06/02/2020 FINAL  Final  Urine culture     Status: Abnormal   Collection Time: 06/01/20  5:15 AM   Specimen: Urine, Clean Catch  Result Value Ref Range Status   Specimen Description   Final    URINE, CLEAN CATCH Performed at [facility], [address] [place], [place] [address]    Special Requests   Final    NONE Performed at [facility], [address] [place], [place] [address]    Culture (A)  Final    >=100,000 COLONIES/mL ESCHERICHIA COLI 50,000 COLONIES/mL ENTEROCOCCUS FAECALIS VANCOMYCIN RESISTANT ENTEROCOCCUS ISOLATED    Report Status 06/03/2020 FINAL  Final   Organism ID, Bacteria ESCHERICHIA COLI (A)  Final   Organism ID, Bacteria ENTEROCOCCUS FAECALIS (A)  Final      Susceptibility   Escherichia coli - MIC*    AMPICILLIN <=2 SENSITIVE Sensitive     CEFAZOLIN <=4 SENSITIVE Sensitive     CEFTRIAXONE <=0.25 SENSITIVE Sensitive     CIPROFLOXACIN <=0.25 SENSITIVE Sensitive     GENTAMICIN <=1 SENSITIVE Sensitive     IMIPENEM <=0.25 SENSITIVE Sensitive     NITROFURANTOIN <=16 SENSITIVE Sensitive     TRIMETH/SULFA <=20 SENSITIVE Sensitive     AMPICILLIN/SULBACTAM <=2 SENSITIVE Sensitive  PIP/TAZO <=4 SENSITIVE Sensitive     * >=100,000 COLONIES/mL ESCHERICHIA COLI   Enterococcus faecalis - MIC*    AMPICILLIN <=2 SENSITIVE Sensitive     NITROFURANTOIN <=16 SENSITIVE Sensitive     VANCOMYCIN >=32 RESISTANT Resistant     * 50,000 COLONIES/mL ENTEROCOCCUS FAECALIS  SARS Coronavirus 2 by RT PCR (hospital order, performed in [facility] hospital lab) Nasopharyngeal Nasopharyngeal Swab     Status: None   Collection Time: 06/07/20 12:28 PM   Specimen: Nasopharyngeal Swab  Result Value Ref Range Status   SARS Coronavirus 2 NEGATIVE NEGATIVE Final    Comment: (NOTE) SARS-CoV-2 target nucleic acids are NOT DETECTED.  The SARS-CoV-2 RNA is  generally detectable in upper and lower respiratory specimens during the acute phase of infection. The lowest concentration of SARS-CoV-2 viral copies this assay can detect is 250 copies / mL. A negative result does not preclude SARS-CoV-2 infection and should not be used as the sole basis for treatment or other patient management decisions.  A negative result may occur with improper specimen collection / handling, submission of specimen other than nasopharyngeal swab, presence of viral mutation(s) within the areas targeted by this assay, and inadequate number of viral copies (<250 copies / mL). A negative result must be combined with clinical observations, patient history, and epidemiological information.  Fact Sheet for Patients:   StrictlyIdeas.no  Fact Sheet for Healthcare Providers: BankingDealers.co.za  This test is not yet approved or  cleared by the [place] FDA and has been authorized for detection and/or diagnosis of SARS-CoV-2 by FDA under an Emergency Use Authorization (EUA).  This EUA will remain in effect (meaning this test can be used) for the duration of the COVID-19 declaration under Section 564(b)(1) of the Act, 21 U.S.C. section 360bbb-3(b)(1), unless the authorization is terminated or revoked sooner.  Performed at [facility], [place] [address], [place],  [address]       Studies: No results found.   Scheduled Meds:  apixaban  5 mg Oral BID   calcium-vitamin D  1 tablet Oral Daily   carvedilol  6.25 mg Oral BID WC   cholecalciferol  400 Units Oral Daily   feeding supplement (ENSURE ENLIVE)  237 mL Oral BID BM   furosemide  20 mg Oral Daily   pantoprazole  40 mg Oral Daily   scopolamine  1 patch Transdermal Q72H   Continuous Infusions:  Principal Problem:   Acute exacerbation of congestive heart failure (HCC) Active Problems:   Hyponatremia   Nausea   Acute heart failure  ([place])   Acute hypoxemic respiratory failure ([place])     Charlies Rayburn Tublu Kamilah Correia, [facility]  If 7PM-7AM, please contact night-coverage [email or web address] Password [facility] 06/07/2020, 6:46 PM    LOS: 6 days

## 2020-06-07 NOTE — Progress Notes (Signed)
[facility] Progress Note   [name] a/an 86 y.o.femalewith a past medical history significant for diastolic heart failure, atrial fibrillation, pulmonary hypertension, lung cancer status post right lower lobectomy, admitted for shortness of breath complicated by hyponatremia.   Assessment/ Plan:   1. Hyponatremia - Patient's Na yesterday evening was 129, which was inline with our goal of correcting the Na to 130. Currently waiting on morning labs to access serum NA and goal correction today. Patient denies any symptoms of hyponatremia nor is she experiencing any symptoms concerning for ODS. She states that she has been able to get out of bed this morning several time without balance problems. She denies tremor, hallucinations, or confusion. She is eating well and does not appear overtly hypervolemic on exam. I suspect her Na will normalize today. - Recheck Na this morning.  2. Acute on Chronic diastolic CHF - does not appear overtly hypervolemic on exam. She denies chest pain, shortness of breath, or orthopnea.  - Strict I&O  - Fluid and salt restricted diet - Hold furosemide.   3. HTN - Patient remains normotensive today with elevated pulse pressure. - Continue to monitor.   4. Hx of lung cancer - repeat CT without concerning findings.   5. UTI - patient had a urine culture positive for Klebsiella faecalis that is vancomycin resistant. She has a history of asymptomatic bacteruria. Given one dose fosfomycin 3g per ID.   6. Permanent A fib: - Rate control with Beta blocker per primary team.   Subjective:    Patient states that she is doing well today. She was able to get up twice this morning. Once to sit in her chair and another time to use the bedside commode. She admits to some generalized weakness, but attributes this to her hospital stay.She denies focal weakness or worsening weakness, ataxia, tremor, balance problems, confusion, or hallucinations. She  denies, chest pain, SHOB, or orthopnea.    Objective:   BP 128/64 (BP Location: Left Arm)   Pulse 72   Temp 98 F (36.7 C) (Oral)   Resp 18   Ht 4\' 11"  (1.499 m)   Wt 65.5 kg   LMP 10/16/1975 (Approximate)   SpO2 98%   BMI 29.17 kg/m   Intake/Output Summary (Last 24 hours) at 06/07/2020 0753 Last data filed at 06/07/2020 0700 Gross per 24 hour  Intake 1721.67 ml  Output 500 ml  Net 1221.67 ml   Weight change: 1.134 kg  Physical Exam: Physical Exam Constitutional:      Appearance: Normal appearance.  HENT:     Head: Normocephalic and atraumatic.  Eyes:     Extraocular Movements: Extraocular movements intact.  Cardiovascular:     Rate and Rhythm: Normal rate.     Pulses: Normal pulses.     Heart sounds: Normal heart sounds.  Pulmonary:     Effort: Pulmonary effort is normal.     Breath sounds: Normal breath sounds.  Abdominal:     General: Bowel sounds are normal.     Palpations: Abdomen is soft.     Tenderness: There is no abdominal tenderness.  Musculoskeletal:        General: Normal range of motion.     Cervical back: Normal range of motion.     Right lower leg: No edema.     Left lower leg: No edema.  Skin:    General: Skin is warm and dry.  Neurological:     Mental Status: She is alert and oriented  to person, place, and time. Mental status is at baseline.  Psychiatric:        Mood and Affect: Mood normal.     Imaging: No results found.  Labs: BMET Recent Labs  Lab 06/03/20 0132 06/03/20 1109 06/04/20 1520 06/04/20 1520 06/04/20 1904 06/04/20 1904 06/04/20 2312 06/05/20 0552 06/05/20 1051 06/05/20 1614 06/05/20 2203 06/06/20 0755 06/06/20 1543  NA 118*   < > 115*   < > 117*   < > 116* 122* 124* 127* 129* 133* 129*  K 4.5   < > 4.9  --  4.9  --  5.2* 5.1  --  5.9* 5.8* 4.7  --   CL 84*   < > 82*  --  80*  --  83* 87*  --  93* 93* 93*  --   CO2 27   < > 21*  --  26  --  25 24  --  17* 26 30  --   GLUCOSE 119*   < > 141*  --  133*  --   141* 104*  --  121* 138* 115*  --   BUN 22   < > 54*  --  54*  --  57* 54*  --  53* 49* 36*  --   CREATININE 1.14*   < > 1.12*  --  1.28*  --  1.16* 1.06*  --  1.12* 1.17* 0.99  --   CALCIUM 8.2*   < > 8.7*  --  9.4  --  8.7* 9.0  --  8.8* 9.2 9.3  --   PHOS 3.5  --   --   --   --   --   --   --   --   --   --   --   --    < > = values in this interval not displayed.   CBC Recent Labs  Lab 06/03/20 0132 06/04/20 1520  WBC 8.2 9.5  HGB 10.7* 11.2*  HCT 32.6* 33.6*  MCV 93.7 93.6  PLT 189 219    Medications:    . apixaban  2.5 mg Oral BID  . calcium-vitamin D  1 tablet Oral Daily  . carvedilol  6.25 mg Oral BID WC  . cholecalciferol  400 Units Oral Daily  . feeding supplement (ENSURE ENLIVE)  237 mL Oral BID BM  . pantoprazole  40 mg Oral Daily  . scopolamine  1 patch Transdermal Q72H      Marianna Payment, D.O. [facility], PGY-2 Pager: [phone], Phone: [phone] Date 06/07/2020 Time 8:29 AM

## 2020-06-07 NOTE — Progress Notes (Signed)
8/24: CSW met with Pt and bedside, confirmed plan was still the same, paged Dr to see when Pt was going to be cleared for dc.

## 2020-06-07 NOTE — Progress Notes (Signed)
Patient complaining of abdominal 'fullness and tightness'. Abdomen is firm to palpation and has noticeable air bubble under skin along abdomen. MD made aware and simethicone ordered and given. Will continue to monitor

## 2020-06-07 NOTE — Progress Notes (Signed)
SATURATION QUALIFICATIONS: (This note is used to comply with regulatory documentation for home oxygen)  Patient Saturations on Room Air at Rest = 92%  Patient Saturations on Room Air while Ambulating =96%  Patient walked ~100 ft on room air with no shortness of breath. Will keep patient on RA unless experiences SOB or desaturation.

## 2020-06-08 LAB — RENAL FUNCTION PANEL
Albumin: 3 g/dL — ABNORMAL LOW (ref 3.5–5.0)
Anion gap: 12 (ref 5–15)
BUN: 28 mg/dL — ABNORMAL HIGH (ref 8–23)
CO2: 30 mmol/L (ref 22–32)
Calcium: 9.3 mg/dL (ref 8.9–10.3)
Chloride: 87 mmol/L — ABNORMAL LOW (ref 98–111)
Creatinine, Ser: 1.06 mg/dL — ABNORMAL HIGH (ref 0.44–1.00)
GFR calc Af Amer: 56 mL/min — ABNORMAL LOW (ref >60)
GFR calc non Af Amer: 49 mL/min — ABNORMAL LOW (ref >60)
Glucose, Bld: 107 mg/dL — ABNORMAL HIGH (ref 70–99)
Phosphorus: 4.2 mg/dL (ref 2.5–4.6)
Potassium: 4.7 mmol/L (ref 3.5–5.1)
Sodium: 129 mmol/L — ABNORMAL LOW (ref 135–145)

## 2020-06-08 MED ORDER — BISACODYL 5 MG PO TBEC: 5.0000 mg | DELAYED_RELEASE_TABLET | Freq: Every day | ORAL | Status: DC | PRN

## 2020-06-08 MED ORDER — ENSURE ENLIVE PO LIQD
237.0000 mL | Freq: Two times a day (BID) | ORAL | Status: DC
Start: 2020-06-08 — End: 2020-07-14

## 2020-06-08 MED ORDER — POLYETHYLENE GLYCOL 3350 17 G PO PACK: 17.0000 g | PACK | Freq: Every day | ORAL | Status: DC | PRN

## 2020-06-08 NOTE — Progress Notes (Signed)
AVS printed and packet placed at nurse's station for transport. Tele and IV removed. Tolerated well.

## 2020-06-08 NOTE — Plan of Care (Signed)
Problem: Education: Goal: Ability to demonstrate management of disease process will improve 06/08/2020 1404 by Mickie Kay, RN Outcome: Adequate for Discharge 06/08/2020 1403 by Mickie Kay, RN Outcome: Progressing Goal: Ability to verbalize understanding of medication therapies will improve 06/08/2020 1404 by Mickie Kay, RN Outcome: Adequate for Discharge 06/08/2020 1403 by Mickie Kay, RN Outcome: Progressing Goal: Individualized Educational Video(s) 06/08/2020 1404 by Mickie Kay, RN Outcome: Adequate for Discharge 06/08/2020 1403 by Mickie Kay, RN Outcome: Progressing   Problem: Activity: Goal: Capacity to carry out activities will improve 06/08/2020 1404 by Mickie Kay, RN Outcome: Adequate for Discharge 06/08/2020 1403 by Mickie Kay, RN Outcome: Progressing   Problem: Cardiac: Goal: Ability to achieve and maintain adequate cardiopulmonary perfusion will improve 06/08/2020 1404 by Mickie Kay, RN Outcome: Adequate for Discharge 06/08/2020 1403 by Mickie Kay, RN Outcome: Progressing   Problem: Education: Goal: Knowledge of General Education information will improve Description: Including pain rating scale, medication(s)/side effects and non-pharmacologic comfort measures 06/08/2020 1404 by Mickie Kay, RN Outcome: Adequate for Discharge 06/08/2020 1403 by Mickie Kay, RN Outcome: Progressing   Problem: Health Behavior/Discharge Planning: Goal: Ability to manage health-related needs will improve 06/08/2020 1404 by Mickie Kay, RN Outcome: Adequate for Discharge 06/08/2020 1403 by Mickie Kay, RN Outcome: Progressing   Problem: Clinical Measurements: Goal: Ability to maintain clinical measurements within normal limits will improve 06/08/2020 1404 by Mickie Kay, RN Outcome: Adequate for Discharge 06/08/2020 1403 by Mickie Kay, RN Outcome: Progressing Goal: Will remain free from  infection 06/08/2020 1404 by Mickie Kay, RN Outcome: Adequate for Discharge 06/08/2020 1403 by Mickie Kay, RN Outcome: Progressing Goal: Diagnostic test results will improve 06/08/2020 1404 by Mickie Kay, RN Outcome: Adequate for Discharge 06/08/2020 1403 by Mickie Kay, RN Outcome: Progressing Goal: Respiratory complications will improve 06/08/2020 1404 by Mickie Kay, RN Outcome: Adequate for Discharge 06/08/2020 1403 by Mickie Kay, RN Outcome: Progressing Goal: Cardiovascular complication will be avoided 06/08/2020 1404 by Mickie Kay, RN Outcome: Adequate for Discharge 06/08/2020 1403 by Mickie Kay, RN Outcome: Progressing   Problem: Activity: Goal: Risk for activity intolerance will decrease 06/08/2020 1404 by Mickie Kay, RN Outcome: Adequate for Discharge 06/08/2020 1403 by Mickie Kay, RN Outcome: Progressing   Problem: Nutrition: Goal: Adequate nutrition will be maintained 06/08/2020 1404 by Mickie Kay, RN Outcome: Adequate for Discharge 06/08/2020 1403 by Mickie Kay, RN Outcome: Progressing   Problem: Coping: Goal: Level of anxiety will decrease 06/08/2020 1404 by Mickie Kay, RN Outcome: Adequate for Discharge 06/08/2020 1403 by Mickie Kay, RN Outcome: Progressing   Problem: Elimination: Goal: Will not experience complications related to bowel motility 06/08/2020 1404 by Mickie Kay, RN Outcome: Adequate for Discharge 06/08/2020 1403 by Mickie Kay, RN Outcome: Progressing Goal: Will not experience complications related to urinary retention 06/08/2020 1404 by Mickie Kay, RN Outcome: Adequate for Discharge 06/08/2020 1403 by Mickie Kay, RN Outcome: Progressing   Problem: Pain Managment: Goal: General experience of comfort will improve 06/08/2020 1404 by Mickie Kay, RN Outcome: Adequate for Discharge 06/08/2020 1403 by Mickie Kay, RN Outcome: Progressing    Problem: Safety: Goal: Ability to remain free from injury will improve 06/08/2020 1404 by Mickie Kay, RN Outcome: Adequate for Discharge 06/08/2020 1403 by Mickie Kay, RN Outcome: Progressing   Problem: Skin Integrity: Goal: Risk for impaired skin integrity  will decrease 06/08/2020 1404 by Mickie Kay, RN Outcome: Adequate for Discharge 06/08/2020 1403 by Mickie Kay, RN Outcome: Progressing

## 2020-06-08 NOTE — Progress Notes (Signed)
First attempt to call report for d/c , left message  Will try again

## 2020-06-08 NOTE — Progress Notes (Signed)
Report just given to Jillyn Hidden RN

## 2020-06-08 NOTE — Progress Notes (Signed)
[facility] Progress Note   [name] a/an 86 y.o.femalewith a past medical history significant for diastolic heart failure, atrial fibrillation, pulmonary hypertension, lung cancer status post right lower lobectomy, admitted for shortness of breath complicated by hyponatremia.  Assessment/ Plan:   1.Hyponatremia -Patients Na this morning is stable at 129. She denies any symptoms of hyponatremia or ODS. Patient did have a mild increase in Cr that does not meet criteria for AKI. Patient apparently has chronic hyponatremia that is usually around 130.  - Continue fluid and salt restricted diet.  - Continue lasix  2. Acute on Chronic diastolic CHF - does not appear overtly hypervolemic on exam. She denies chest pain, shortness of breath, or orthopnea.  - Strict I&O  - Fluid and salt restricted diet - Hold furosemide.   3. HTN- Patient remains normotensive today with elevated pulse pressure. - Continue to monitor.   4. Hx of lung cancer- repeat CT without concerning findings.  5.UTI - patient had a urine culture positive for Klebsiella faecalis that is vancomycin resistant. She has a history of asymptomatic bacteruria. Given one dose fosfomycin 3g per ID.   6. Permanent A fib: - Rate control with Beta blocker per primary team  Subjective:   Patient states that she is feeling a little but better today. She does admit to abdominal bloating and lower extremity edema up to her hips. She states that her breathing has improved. She denies chest pain, but does get palpitations when she lies flat, which is not new for her.    Objective:   BP 123/72 (BP Location: Left Arm)   Pulse 82   Temp 98.1 F (36.7 C) (Oral)   Resp 18   Ht 4\' 11"  (1.499 m)   Wt 66.3 kg   LMP 10/16/1975 (Approximate)   SpO2 92%   BMI 29.51 kg/m   Intake/Output Summary (Last 24 hours) at 06/08/2020 0920 Last data filed at 06/08/2020 0747 Gross per 24 hour  Intake 1020 ml  Output 2  ml  Net 1018 ml   Weight change: 0.771 kg  Physical Exam: Physical Exam Constitutional:      Appearance: Normal appearance.  HENT:     Head: Normocephalic and atraumatic.  Eyes:     Extraocular Movements: Extraocular movements intact.  Cardiovascular:     Rate and Rhythm: Normal rate.     Pulses: Normal pulses.     Heart sounds: Normal heart sounds.  Pulmonary:     Effort: Pulmonary effort is normal. No respiratory distress.     Breath sounds: Wheezing (upper lung field) and rales present.  Chest:     Chest wall: No tenderness.  Abdominal:     General: Bowel sounds are normal.     Palpations: Abdomen is soft.     Tenderness: There is no abdominal tenderness.  Musculoskeletal:        General: No tenderness. Normal range of motion.     Cervical back: Normal range of motion.     Right lower leg: Edema (2-3+ to hips) present.     Left lower leg: Edema (2-3+ to hips) present.  Skin:    General: Skin is warm and dry.  Neurological:     Mental Status: She is alert and oriented to person, place, and time. Mental status is at baseline.  Psychiatric:        Mood and Affect: Mood normal.      Imaging: No results found.  Labs: [employer]  06/03/20 0132 06/03/20 1109 06/04/20 2312 06/04/20 2312 06/05/20 0552 06/05/20 0552 06/05/20 1051 06/05/20 1614 06/05/20 2203 06/06/20 0755 06/06/20 1543 06/07/20 1020 06/08/20 0719  NA 118*   < > 116*   < > 122*   < > 124* 127* 129* 133* 129* 130* 129*  K 4.5   < > 5.2*  --  5.1  --   --  5.9* 5.8* 4.7  --  5.0 4.7  CL 84*   < > 83*  --  87*  --   --  93* 93* 93*  --  91* 87*  CO2 27   < > 25  --  24  --   --  17* 26 30  --  29 30  GLUCOSE 119*   < > 141*  --  104*  --   --  121* 138* 115*  --  102* 107*  BUN 22   < > 57*  --  54*  --   --  53* 49* 36*  --  27* 28*  CREATININE 1.14*   < > 1.16*  --  1.06*  --   --  1.12* 1.17* 0.99  --  0.87 1.06*  CALCIUM 8.2*   < > 8.7*  --  9.0  --   --  8.8* 9.2 9.3  --  8.9 9.3   PHOS 3.5  --   --   --   --   --   --   --   --   --   --  4.0 4.2   < > = values in this interval not displayed.   CBC Recent Labs  Lab 06/03/20 0132 06/04/20 1520  WBC 8.2 9.5  HGB 10.7* 11.2*  HCT 32.6* 33.6*  MCV 93.7 93.6  PLT 189 219    Medications:    . apixaban  5 mg Oral BID  . calcium-vitamin D  1 tablet Oral Daily  . carvedilol  6.25 mg Oral BID WC  . cholecalciferol  400 Units Oral Daily  . feeding supplement (ENSURE ENLIVE)  237 mL Oral BID BM  . furosemide  20 mg Oral Daily  . pantoprazole  40 mg Oral Daily  . scopolamine  1 patch Transdermal Q72H    Marianna Payment, D.O. [facility], PGY-2 Pager: [phone], Phone: [phone] Date 06/08/2020 Time 9:58 AM

## 2020-06-08 NOTE — TOC Transition Note (Signed)
Transition of Care [facility]) - CM/SW Discharge Note   Patient Details  Name: [name] MRN: [id number] Date of Birth: April 05, 1934  [facility] CM/SW Contact:  [address], [place] Phone Number: 06/08/2020, 2:28 PM   Clinical Narrative:    Patient will DC [id number] [place] Anticipated DC date: 06/08/20 Family notified: Yes, Lesia Sago  Transport by: Corey Harold   Per MD patient ready for DC to . RN to call report prior to discharge ([phone] ask for [facility] Nurse). RN, patient, patient's family, and facility notified of DC. Discharge Summary and FL2 sent to facility. DC packet on chart. Ambulance transport requested for patient. DNR signed and included in DC packet.   CSW will sign off for now as social work intervention is no longer needed. Please consult [place] again if new needs arise.     Final next level of care: Skilled Nursing Facility Barriers to Discharge: Barriers Resolved   Patient Goals and CMS Choice   CMS Medicare.gov Compare Post Acute Care list provided to:: Patient Choice offered to / list presented to : Patient  Discharge Placement PASRR number recieved: 06/03/20            Patient chooses bed at: [facility] Patient to be transferred to facility by: [place] Name of family member notified: Jude Linck Patient and family notified of of transfer: 06/08/20  Discharge Plan and Services     Post Acute Care Choice: [place]                               Social Determinants of Health (SDOH) Interventions     Readmission Risk Interventions No flowsheet data found.

## 2020-06-08 NOTE — Discharge Summary (Signed)
Physician Discharge Summary  [name] WOE:[id number] DOB: [birth year] DOA: 05/30/2020  PCP: Marton Redwood, MD  Admit date: 05/30/2020 Discharge date: 06/08/2020  Admitted From: Home  Discharge disposition: SNF   Recommendations for Outpatient Follow-Up:   . Follow up with your primary care provider at the skilled nursing facility in 3 to 5 days . Check CBC, BMP, magnesium on 06/13/20 . Follow-up with nephrology Dr. Carolin Sicks as scheduled by the nephrology clinic.  If worsening hyponatremia, please inform Dr. Carolin Sicks about it. . Patient advised fluid restriction 1200 mL/day. . Losartan has been discontinued including amlodipine. closely monitor patient has been continued on coreg.   Discharge Diagnosis:   Principal Problem:   Acute exacerbation of congestive heart failure (HCC) Active Problems:   Hyponatremia   Nausea   Acute heart failure ([place])   Acute hypoxemic respiratory failure ([place])   Discharge Condition: Improved.  Diet recommendation:  heart healthy.  Fluid restriction 1200 mL.  Wound care: None.  Code status: DNR   History of Present Illness:   86 year old female with HFpEF, PAF, SIADH, pulmonary hypertension, ocular adenoma status post lobectomy was admitted on 05/30/2020 with shortness of breath thought to be secondary to decompensated heart failure.  On examination she was noted to have significant hyponatremia to 118.  Urine osmolality and urine sodium was consistent with decompensated heart failure/intravascular volume depletion with avid sodium retention state.  Nephrology has been following.  Hospital Course:   Following conditions were addressed during hospitalization as listed below,  Hyponatremia Sodium has remained stable at 129.  Patient was seen by nephrology during hospitalization and received tolvaptan, hypertonic solution and fluid restriction.  Spoke with nephrology prior to discharge.  Plan is to continue fluid restriction and low-dose  Lasix on discharge.  Patient will be followed up with nephrology as outpatient.  Will need BMP as outpatient in 3 to 5 days.  Acute decompensation of HFpEF Improved at this time.  Not on supplemental oxygen.  Has been started back on 20 mg of Lasix daily.  Off amlodipine and Micardis at this time.  Atrial fibrillation Rate is well controlled on carvedilol. Secondary stroke prevention with apixaban.  UTI Urinalysis with E. coli and Enterococcus faecalis.  Received 3 g fosfomycin x1   Disposition.  At this time, patient is stable for disposition nursing facility.  Spoke with the patient's son at bedside regarding plan for disposition and follow-up.  Medical Consultants:    Nephrology  Procedures:    None Subjective:   Today, patient feels okay.  Denies any nausea vomiting.  Son at bedside.  Reports she is eating better with good bowel movements.  Denies dyspnea chest pain.  Discharge Exam:   Vitals:   06/08/20 0828 06/08/20 1156  BP: 123/72 117/67  Pulse: 82 69  Resp: 18 18  Temp: 98.1 F (36.7 C) 98.5 F (36.9 C)  SpO2: 92% 95%   Vitals:   06/08/20 0319 06/08/20 0752 06/08/20 0828 06/08/20 1156  BP: 113/65 131/73 123/72 117/67  Pulse: 72  82 69  Resp: 17  18 18   Temp: 97.9 F (36.6 C)  98.1 F (36.7 C) 98.5 F (36.9 C)  TempSrc: Oral  Oral Oral  SpO2: 95%  92% 95%  Weight:      Height:        General: Alert awake, not in obvious distress HENT: pupils equally reacting to light,  No scleral pallor or icterus noted. Oral mucosa is moist.  Chest:    Diminished breath  sounds bilaterally.  CVS: S1 &S2 heard. No murmur.  Regular rate and rhythm. Abdomen: Soft, nontender, nondistended.  Bowel sounds are heard.   Extremities: No cyanosis, clubbing but bilateral lower extremity pitting edema noted.  Peripheral pulses are palpable. Psych: Alert, awake and oriented, normal mood CNS:  No cranial nerve deficits.  Power equal in all extremities.   Skin: Warm and dry.   No rashes noted.  The results of significant diagnostics from this hospitalization (including imaging, microbiology, ancillary and laboratory) are listed below for reference.     Diagnostic Studies:   DG Chest Portable 1 View  Result Date: 05/30/2020 CLINICAL DATA:  Cough and shortness of breath for the past week. History of lung cancer. EXAM: PORTABLE CHEST 1 VIEW COMPARISON:  CT chest dated Mar 03, 2020. Chest x-ray dated July 17, 2016. FINDINGS: Stable cardiomegaly. New bilateral perihilar interstitial hazy airspace opacities. Postsurgical changes in the right lung. Unchanged chronic small right pleural effusion. No pneumothorax. No acute osseous abnormality. IMPRESSION: 1. New bilateral perihilar interstitial and hazy airspace opacities concerning for pulmonary edema or atypical infection. 2. Unchanged chronic small right pleural effusion. Electronically Signed   By: Titus Dubin M.D.   On: 05/30/2020 13:06    Labs:   Basic Metabolic Panel: Recent Labs  Lab 06/02/20 0117 06/02/20 0925 06/03/20 0132 06/03/20 1109 06/04/20 0237 06/04/20 0804 06/05/20 1614 06/05/20 1614 06/05/20 2203 06/05/20 2203 06/06/20 0755 06/06/20 0755 06/06/20 1543 06/07/20 1020 06/08/20 0719  NA 119*   < > 118*   < > 117*   < > 127*   < > 129*  --  133*  --  129* 130* 129*  K 5.1   < > 4.5   < > 5.1   < > 5.9*   < > 5.8*   < > 4.7   < >  --  5.0 4.7  CL 81*   < > 84*   < > 84*   < > 93*  --  93*  --  93*  --   --  91* 87*  CO2 27   < > 27   < > 18*   < > 17*  --  26  --  30  --   --  29 30  GLUCOSE 129*   < > 119*   < > 102*   < > 121*  --  138*  --  115*  --   --  102* 107*  BUN 13   < > 22   < > 40*   < > 53*  --  49*  --  36*  --   --  27* 28*  CREATININE 0.94   < > 1.14*   < > 1.15*   < > 1.12*  --  1.17*  --  0.99  --   --  0.87 1.06*  CALCIUM 8.4*   < > 8.2*   < > 8.8*   < > 8.8*  --  9.2  --  9.3  --   --  8.9 9.3  MG 1.5*  --  2.0  --  2.2  --   --   --   --   --   --   --   --   --   --    PHOS  --   --  3.5  --   --   --   --   --   --   --   --   --   --  4.0 4.2   < > = values in this interval not displayed.   GFR Estimated Creatinine Clearance: 33.3 mL/min (A) (by C-G formula based on SCr of 1.06 mg/dL (H)). Liver Function Tests: Recent Labs  Lab 06/02/20 0117 06/03/20 0132 06/07/20 1020 06/08/20 0719  AST 32 23  --   --   ALT 17 17  --   --   ALKPHOS 67 59  --   --   BILITOT 1.7* 0.9  --   --   PROT 5.9* 5.3*  --   --   ALBUMIN 2.9* 2.5* 2.6* 3.0*   Recent Labs  Lab 06/02/20 0117  LIPASE 52*   No results for input(s): AMMONIA in the last 168 hours. Coagulation profile No results for input(s): INR, PROTIME in the last 168 hours.  CBC: Recent Labs  Lab 06/03/20 0132 06/04/20 1520  WBC 8.2 9.5  HGB 10.7* 11.2*  HCT 32.6* 33.6*  MCV 93.7 93.6  PLT 189 219   Cardiac Enzymes: No results for input(s): CKTOTAL, CKMB, CKMBINDEX, TROPONINI in the last 168 hours. BNP: Invalid input(s): POCBNP CBG: No results for input(s): GLUCAP in the last 168 hours. D-Dimer No results for input(s): DDIMER in the last 72 hours. Hgb A1c No results for input(s): HGBA1C in the last 72 hours. Lipid Profile No results for input(s): CHOL, HDL, LDLCALC, TRIG, CHOLHDL, LDLDIRECT in the last 72 hours. Thyroid function studies No results for input(s): TSH, T4TOTAL, T3FREE, THYROIDAB in the last 72 hours.  Invalid input(s): FREET3 Anemia work up No results for input(s): VITAMINB12, FOLATE, FERRITIN, TIBC, IRON, RETICCTPCT in the last 72 hours. Microbiology Recent Results (from the past 240 hour(s))  SARS Coronavirus 2 by RT PCR (hospital order, performed in [facility] hospital lab) Nasopharyngeal Nasopharyngeal Swab     Status: None   Collection Time: 05/30/20  1:35 PM   Specimen: Nasopharyngeal Swab  Result Value Ref Range Status   SARS Coronavirus 2 NEGATIVE NEGATIVE Final    Comment: (NOTE) SARS-CoV-2 target nucleic acids are NOT DETECTED.  The SARS-CoV-2 RNA is  generally detectable in upper and lower respiratory specimens during the acute phase of infection. The lowest concentration of SARS-CoV-2 viral copies this assay can detect is 250 copies / mL. A negative result does not preclude SARS-CoV-2 infection and should not be used as the sole basis for treatment or other patient management decisions.  A negative result may occur with improper specimen collection / handling, submission of specimen other than nasopharyngeal swab, presence of viral mutation(s) within the areas targeted by this assay, and inadequate number of viral copies (<250 copies / mL). A negative result must be combined with clinical observations, patient history, and epidemiological information.  Fact Sheet for Patients:   StrictlyIdeas.no  Fact Sheet for Healthcare Providers: BankingDealers.co.za  This test is not yet approved or  cleared by the [place] FDA and has been authorized for detection and/or diagnosis of SARS-CoV-2 by FDA under an Emergency Use Authorization (EUA).  This EUA will remain in effect (meaning this test can be used) for the duration of the COVID-19 declaration under Section 564(b)(1) of the Act, 21 U.S.C. section 360bbb-3(b)(1), unless the authorization is terminated or revoked sooner.  Performed at [facility], [address] [place], [place] [address]   Urine culture     Status: Abnormal   Collection Time: 05/31/20  8:00 PM   Specimen: Urine, Random  Result Value Ref Range Status   Specimen Description  Final    URINE, RANDOM Performed at [facility], [address] [place], [place] [address]    Special Requests   Final    NONE Performed at [facility], [address] [place], [place] [address]    Culture MULTIPLE SPECIES PRESENT, SUGGEST RECOLLECTION (A)  Final   Report Status 06/02/2020 FINAL  Final  Urine culture     Status: Abnormal    Collection Time: 06/01/20  5:15 AM   Specimen: Urine, Clean Catch  Result Value Ref Range Status   Specimen Description   Final    URINE, CLEAN CATCH Performed at [facility], [address] [place], [place] [address]    Special Requests   Final    NONE Performed at [facility], [address] [place], [place] [address]    Culture (A)  Final    >=100,000 COLONIES/mL ESCHERICHIA COLI 50,000 COLONIES/mL ENTEROCOCCUS FAECALIS VANCOMYCIN RESISTANT ENTEROCOCCUS ISOLATED    Report Status 06/03/2020 FINAL  Final   Organism ID, Bacteria ESCHERICHIA COLI (A)  Final   Organism ID, Bacteria ENTEROCOCCUS FAECALIS (A)  Final      Susceptibility   Escherichia coli - MIC*    AMPICILLIN <=2 SENSITIVE Sensitive     CEFAZOLIN <=4 SENSITIVE Sensitive     CEFTRIAXONE <=0.25 SENSITIVE Sensitive     CIPROFLOXACIN <=0.25 SENSITIVE Sensitive     GENTAMICIN <=1 SENSITIVE Sensitive     IMIPENEM <=0.25 SENSITIVE Sensitive     NITROFURANTOIN <=16 SENSITIVE Sensitive     TRIMETH/SULFA <=20 SENSITIVE Sensitive     AMPICILLIN/SULBACTAM <=2 SENSITIVE Sensitive     PIP/TAZO <=4 SENSITIVE Sensitive     * >=100,000 COLONIES/mL ESCHERICHIA COLI   Enterococcus faecalis - MIC*    AMPICILLIN <=2 SENSITIVE Sensitive     NITROFURANTOIN <=16 SENSITIVE Sensitive     VANCOMYCIN >=32 RESISTANT Resistant     * 50,000 COLONIES/mL ENTEROCOCCUS FAECALIS  SARS Coronavirus 2 by RT PCR (hospital order, performed in [place] hospital lab) Nasopharyngeal Nasopharyngeal Swab     Status: None   Collection Time: 06/07/20 12:28 PM   Specimen: Nasopharyngeal Swab  Result Value Ref Range Status   SARS Coronavirus 2 NEGATIVE NEGATIVE Final    Comment: (NOTE) SARS-CoV-2 target nucleic acids are NOT DETECTED.  The SARS-CoV-2 RNA is generally detectable in upper and lower respiratory specimens during the acute phase of infection. The lowest concentration of SARS-CoV-2 viral copies this assay can detect  is 250 copies / mL. A negative result does not preclude SARS-CoV-2 infection and should not be used as the sole basis for treatment or other patient management decisions.  A negative result may occur with improper specimen collection / handling, submission of specimen other than nasopharyngeal swab, presence of viral mutation(s) within the areas targeted by this assay, and inadequate number of viral copies (<250 copies / mL). A negative result must be combined with clinical observations, patient history, and epidemiological information.  Fact Sheet for Patients:   StrictlyIdeas.no  Fact Sheet for Healthcare Providers: BankingDealers.co.za  This test is not yet approved or  cleared by the [place] FDA and has been authorized for detection and/or diagnosis of SARS-CoV-2 by FDA under an Emergency Use Authorization (EUA).  This EUA will remain in effect (meaning this test can be used) for the duration of the COVID-19 declaration under Section 564(b)(1) of the Act, 21 U.S.C. section 360bbb-3(b)(1), unless the authorization is terminated or revoked sooner.  Performed at [facility], [place] [address], [place], [place] [address]      Discharge Instructions:   Discharge Instructions    Call MD for:  difficulty breathing, headache or visual disturbances   Complete by: As directed    Call MD for:  persistant dizziness or light-headedness   Complete by: As directed    Diet Heart   Complete by: As directed    Fluid restriction 1200 mL/day   Discharge instructions   Complete by: As directed    Follow-up with your primary care provider at the skilled nursing facility in 3 to 5 days.  Please check BMP on 06/13/2020 and communicate with nephrology.  Nephrology clinic to follow the patient as outpatient.   Increase activity slowly   Complete by: As directed      Allergies as of 06/08/2020      Reactions   Aspirin Rash        Medication List    STOP taking these medications   amLODipine 10 MG tablet Commonly known as: NORVASC   telmisartan 80 MG tablet Commonly known as: MICARDIS   UNABLE TO FIND     TAKE these medications   acetaminophen 500 MG tablet Commonly known as: TYLENOL Take 2 tablets (1,000 mg total) by mouth every 6 (six) hours as needed for mild pain.   bisacodyl 5 MG EC tablet Commonly known as: DULCOLAX Take 1 tablet (5 mg total) by mouth daily as needed for severe constipation.   Caltrate 600+D 600-400 MG-UNIT tablet Generic drug: Calcium Carbonate-Vitamin D Take 1 tablet by mouth daily.   carboxymethylcellulose 0.5 % Soln Commonly known as: REFRESH PLUS Place 1 drop into both eyes 2 (two) times daily as needed (for dry eyes).   carvedilol 6.25 MG tablet Commonly known as: COREG Take 1 tablet (6.25 mg total) by mouth 2 (two) times daily with a meal.   Eliquis 5 MG Tabs tablet Generic drug: apixaban Take 5 mg by mouth 2 (two) times daily.   Estradiol 10 MCG Tabs vaginal tablet Commonly known as: Vagifem Place 1 tablet (10 mcg total) vaginally 2 (two) times a week.   feeding supplement (ENSURE ENLIVE) Liqd Take 237 mLs by mouth 2 (two) times daily between meals.   Fish Oil 1000 MG Caps Take 1 capsule by mouth 2 (two) times daily.   furosemide 20 MG tablet Commonly known as: LASIX Take 1 tablet (20 mg total) by mouth daily.   multivitamin with minerals Tabs tablet Take 1 tablet by mouth every evening.   pantoprazole 40 MG tablet Commonly known as: PROTONIX Take 40 mg by mouth daily.   polyethylene glycol 17 g packet Commonly known as: MIRALAX / GLYCOLAX Take 17 g by mouth daily as needed for moderate constipation.   rosuvastatin 10 MG tablet Commonly known as: CRESTOR Take 10 mg by mouth at bedtime.   Vitamin D2 10 MCG (400 UNIT) Tabs Take 400 Units by mouth daily.       Contact information for follow-up providers    Rosita Fire, MD Follow up.    Specialties: Nephrology, Internal Medicine Why: office to call Contact information: [place] [address] [phone]            Contact information for after-discharge care    Destination    [facility] .   Service: Skilled Chiropodist information: [place] [phone]  Time coordinating discharge: 39 minutes  Signed:  Geza Beranek  [facility] 06/08/2020, 12:23 PM

## 2020-06-08 NOTE — Progress Notes (Signed)
Physical Therapy Treatment Patient Details Name: [name] MRN: [id number] DOB: 08-Feb-1934 Today's Date: 06/08/2020    History of Present Illness [name] is a 86 y.o. female with medical history significant of chronic diastolic CHF, permanent A. 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     PT Comments    Pt admitted with above diagnosis. Pt was able to ambulate with RW with min guard assist with sats >90% on RA.  Progressing slowly as she cannot incr distance but her DOE is better today.  She still  Needs cues for pursed lip breathing.  Pt currently with functional limitations due to the deficits listed below (see PT Problem List). Pt will benefit from skilled PT to increase their independence and safety with mobility to allow discharge to the venue listed below.     Follow Up Recommendations  SNF;Supervision/Assistance - 24 hour     Equipment Recommendations  Rolling walker with 5" wheels    Recommendations for Other Services       Precautions / Restrictions Precautions Precautions: Fall Restrictions Weight Bearing Restrictions: No    Mobility  Bed Mobility               General bed mobility comments: OOB in chair at beginning and end of session  Transfers Overall transfer level: Needs assistance Equipment used: Rolling walker (2 wheeled);1 person hand held assist Transfers: Sit to/from Stand;Stand Pivot Transfers Sit to Stand: Min guard         General transfer comment: declined transfers at this time due to recent activity with PT  Ambulation/Gait Ambulation/Gait assistance: Min guard Gait Distance  (Feet): 130 Feet Assistive device: Rolling walker (2 wheeled) Gait Pattern/deviations: Step-through pattern;Decreased stride length;Trunk flexed;Wide base of support   Gait velocity interpretation: <1.31 ft/sec, indicative of household ambulator General Gait Details: PT min guard assist with  RW wtih fair safety.  Fatigues quickly but did incr distance today. No O2 needed with ambulation.  Did need cues fro pursed lip breathing.    Stairs             Wheelchair Mobility    Modified Rankin (Stroke Patients Only)       Balance Overall balance assessment: Needs assistance;History of Falls Sitting-balance support: Feet supported;No upper extremity supported Sitting balance-[name]: Fair     Standing balance support: Bilateral upper extremity supported;During functional activity Standing balance-[name]: Poor Standing balance comment: Needs UE support on RW                             Cognition Arousal/Alertness: Awake/alert Behavior During Therapy: WFL for tasks assessed/performed Overall Cognitive Status: Within Functional Limits for tasks assessed                                        Exercises      General Comments General comments (skin integrity, edema, etc.): son present throughout session      Pertinent Vitals/Pain Pain Assessment: Faces Faces Pain Scale: Hurts a little bit Pain Location: L knee, right shoulder Pain Descriptors /  Indicators: Sore;Aching Pain Intervention(s): Limited activity within patient's tolerance;Monitored during session    Home Living                      Prior Function            PT Goals (current goals can now be found in the care plan section) Acute Rehab PT Goals Patient Stated Goal: regain independence and strength Progress towards PT goals: Progressing toward goals    Frequency    Min 2X/week      PT Plan Current plan remains appropriate;Frequency needs to be updated     Co-evaluation              AM-PAC PT "6 Clicks" Mobility   Outcome Measure  Help needed turning from your back to your side while in a flat bed without using bedrails?: A Little Help needed moving from lying on your back to sitting on the side of a flat bed without using bedrails?: A Little Help needed moving to and from a bed to a chair (including a wheelchair)?: A Little Help needed standing up from a chair using your arms (e.g., wheelchair or bedside chair)?: A Little Help needed to walk in hospital room?: A Little Help needed climbing 3-5 steps with a railing? : A Little 6 Click Score: 18    End of Session Equipment Utilized During Treatment: Gait belt;Oxygen Activity Tolerance: Patient limited by fatigue Patient left: with call bell/phone within reach;in chair;with chair alarm set Nurse Communication: Mobility status PT Visit Diagnosis: Unsteadiness on feet (R26.81);Muscle weakness (generalized) (M62.81)     Time: 2482-5003 PT Time Calculation (min) (ACUTE ONLY): 19 min  Charges:  $Gait Training: 8-22 mins                     Jadrian Bulman W,PT [place] Pager:  [phone]  Office:  [place] 06/08/2020, 2:09 PM

## 2020-06-08 NOTE — Progress Notes (Signed)
Occupational Therapy Treatment Patient Details Name: [name] MRN: [id number] DOB: November 24, 1933 Today's Date: 06/08/2020    History of present illness [name] is a 86 y.o. female with medical history significant of chronic diastolic CHF, permanent A. 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    OT comments  Pt progressing towards OT goals this session. Recent PT session with mobility, and planned dc later today - session focused on education for AE education. Long handle shoe horn, grabber/reacher, long handle sponge, sock donner. Pt familiar with these tools from previous TKA, THA. Son present throughout session. Current recommendations are appropriate.    Follow Up Recommendations  SNF    Equipment Recommendations  Other (comment) (defer to next venue - recommended AE for LB ADL)    Recommendations for Other Services      Precautions / Restrictions Precautions Precautions: Fall Restrictions Weight Bearing Restrictions: No       Mobility Bed Mobility               General bed mobility comments: OOB in chair at beginning and end of session  Transfers                 General transfer comment: declined transfers at this time due to recent activity with PT    Balance Overall balance assessment: Needs assistance;History of Falls Sitting-balance support: Feet supported;No upper extremity supported Sitting balance-[name]: Fair                                     ADL either performed or assessed with clinical judgement   ADL Overall ADL's : Needs assistance/impaired              Lower Body Bathing: Min guard;Sitting/lateral leans;With adaptive equipment;Cueing for compensatory techniques Lower Body Bathing Details (indicate cue type and reason): educated in adaptive equipment (long handle sponge)  for LB bathing     Lower Body Dressing: Minimal assistance;Cueing for compensatory techniques;With adaptive equipment Lower Body Dressing Details (indicate cue type and reason): educated on sock donner, long handle shoe horn, and grabber/reacher. Pt familiar from previous TKA and THA               General ADL Comments: session focused on AE education for LB ADL     Vision       Perception     Praxis      Cognition Arousal/Alertness: Awake/alert Behavior During Therapy: WFL for tasks assessed/performed Overall Cognitive Status: Within Functional Limits for tasks assessed                                          Exercises     Shoulder Instructions       General Comments son present throughout session    Pertinent Vitals/ Pain       Pain Assessment: Faces Faces Pain Scale: Hurts a little bit Pain Location: L knee, right shoulder Pain Descriptors / Indicators: Sore;Aching Pain Intervention(s): Limited activity within patient's tolerance;Monitored during session  Home Living                                          Prior Functioning/Environment              Frequency  Min 2X/week        Progress Toward Goals  OT Goals(current goals can now be found in the care plan section)  Progress towards OT goals: Progressing toward goals  Acute Rehab OT Goals Patient Stated Goal: regain independence and strength OT Goal Formulation: With patient/family Time For Goal Achievement: 06/16/20 Potential to Achieve Goals: Good  Plan Discharge plan remains appropriate    Co-evaluation                 AM-PAC OT "6 Clicks" Daily Activity     Outcome Measure   Help from another person eating meals?:  None Help from another person taking care of personal grooming?: A Little Help from another person toileting, which includes using toliet, bedpan, or urinal?: A Little Help from another person bathing (including washing, rinsing, drying)?: A Little Help from another person to put on and taking off regular upper body clothing?: A Little Help from another person to put on and taking off regular lower body clothing?: A Lot 6 Click Score: 18    End of Session Equipment Utilized During Treatment: Oxygen  OT Visit Diagnosis: Unsteadiness on feet (R26.81);Pain;Muscle weakness (generalized) (M62.81);History of falling (Z91.81) Pain - Right/Left: Left Pain - part of body: Knee   Activity Tolerance Patient tolerated treatment well   Patient Left in chair;with call bell/phone within reach;with family/visitor present   Nurse Communication Mobility status (ok to have chicken noodle soup for lunch)        Time: 6387-5643 OT Time Calculation (min): 15 min  Charges: OT General Charges $OT Visit: 1 Visit OT Treatments $Self Care/Home Management : 8-22 mins  Jesse Sans OTR/L Acute Rehabilitation Services Pager: [phone] Office: [place] 06/08/2020, 12:59 PM

## 2020-06-09 ENCOUNTER — Encounter: Payer: Self-pay | Admitting: Nurse Practitioner

## 2020-06-09 ENCOUNTER — Non-Acute Institutional Stay (SKILLED_NURSING_FACILITY): Payer: Medicare Other | Admitting: Nurse Practitioner

## 2020-06-09 DIAGNOSIS — I5033 Acute on chronic diastolic (congestive) heart failure: Secondary | ICD-10-CM | POA: Diagnosis not present

## 2020-06-09 DIAGNOSIS — K219 Gastro-esophageal reflux disease without esophagitis: Secondary | ICD-10-CM

## 2020-06-09 DIAGNOSIS — K5901 Slow transit constipation: Secondary | ICD-10-CM | POA: Diagnosis not present

## 2020-06-09 DIAGNOSIS — N952 Postmenopausal atrophic vaginitis: Secondary | ICD-10-CM | POA: Insufficient documentation

## 2020-06-09 DIAGNOSIS — I482 Chronic atrial fibrillation, unspecified: Secondary | ICD-10-CM

## 2020-06-09 DIAGNOSIS — R58 Hemorrhage, not elsewhere classified: Secondary | ICD-10-CM | POA: Insufficient documentation

## 2020-06-09 DIAGNOSIS — E871 Hypo-osmolality and hyponatremia: Secondary | ICD-10-CM

## 2020-06-09 DIAGNOSIS — I10 Essential (primary) hypertension: Secondary | ICD-10-CM

## 2020-06-09 NOTE — Assessment & Plan Note (Signed)
Stable, continue Pantoprazole.  

## 2020-06-09 NOTE — Assessment & Plan Note (Signed)
Hospitalized 05/30/20-06/08/20 for acute on CHF, on low dose of Furosemide,  hyponatremia, 129 upon dc, on 1200cc/day fluid restriction, f/u BMP 06/13/20, pending Nephrology, eGFR 49 06/08/20 chronic edema BLE, takes Furosemide, EF 60-65%

## 2020-06-09 NOTE — Assessment & Plan Note (Signed)
No BM>2 days, prn Bisacodyl is not adequate, will add MiraLax qd. Observe.

## 2020-06-09 NOTE — Assessment & Plan Note (Signed)
Heart rte is in control, continue Coreg ,Eliquis.

## 2020-06-09 NOTE — Progress Notes (Signed)
Location:   [facility] Room Number: 17 Place of Service:  SNF (31) Provider: Marlana Latus NP  Marton Redwood, MD  Patient Care Team: Marton Redwood, MD as PCP - General (Internal Medicine) O'Neal, Cassie Freer, MD as PCP - Cardiology (Cardiology) Megan Salon, MD (Gynecology) Eston Esters, MD (Inactive) (Hematology and Oncology) Laurence Spates, MD (Inactive) (Gastroenterology)  Extended Emergency Contact Information Primary Emergency Contact: Filippini,[name] Address: [address]          [place], [place] [address] [name] Phone: [phone] Relation: Son Secondary Emergency Contact: Epifanio Lesches, [place] Phone: [phone] Mobile Phone: [phone] Relation: Daughter  Code Status: DNR Goals of care: Advanced Directive information Advanced Directives 05/30/2020  Does Patient Have a Medical Advance Directive? Yes  Type of Advance Directive -  Does patient want to make changes to medical advance directive? -  Copy of [place] in Chart? -  Would patient like information on creating a medical advance directive? -  Pre-existing out of facility DNR order (yellow form or pink MOST form) -     Chief Complaint  Patient presents with   Acute Visit    Medication review    HPI:  Pt is a 86 y.o. female seen today for an acute visit for medication review  Hospitalized 05/30/20-06/08/20 for acute on CHF, on low dose of Furosemide,  hyponatremia, 129 upon dc, on 1200cc/day fluid restriction, f/u BMP 06/13/20, pending Nephrology, eGFR 49 06/08/20  HTN, takes Coreg, Rosuvastatin  Afib, takes Coreg, Eliquis  CHF, chronic edema BLE, takes Furosemide, EF 60-65%  Constipation, no BM > 2days, prn MiraLax, Bisacodyl.   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   Right lower lobectomy   Diverticulitis 1999   Femur fracture ([place]) 03/2013   left, non weight bearing for 2 1/2 weeks   GERD (gastroesophageal reflux disease)    Hemorrhoids 1991   HTN (hypertension)    Lymphedema of upper extremity    right arm   Osteoporosis    PONV (postoperative nausea and vomiting)    Urinary, incontinence, stress female    Vitamin D deficiency disease    Past Surgical History:  Procedure Laterality Date   APPENDECTOMY     BREAST BIOPSY  05/04/1987   Right Dr Annamaria Boots   BREAST IMPLANT REMOVAL Right 06/05/10   BUNIONECTOMY  1988   carcinoid Right    Right lower lobectomy 2017   COLONOSCOPY W/ POLYPECTOMY     EYE SURGERY  5/14 ; 6/14   Cataract Surgery   JOINT REPLACEMENT     2002 left; 2009 right Hip, rt knee 2012   KNEE ARTHROPLASTY Right 2012   [place] Right 01/05/2016   Procedure: RIGHT LOWER LOBE LOBECTOMY;  Surgeon: Melrose Nakayama, MD;  Location: [facility];  Service: Thoracic;  Laterality: Right;   MASTECTOMY PARTIAL / LUMPECTOMY W/ AXILLARY LYMPHADENECTOMY  04/05/1987   Right - Dr Annamaria Boots   RESECTION OF MEDIASTINAL MASS N/A 01/05/2016   Procedure: RESECTION OF PLEURAL MASS, right;  Surgeon: Melrose Nakayama, MD;  Location: [facility];  Service: Thoracic;  Laterality: N/A;   TONSILLECTOMY     TOTAL HIP ARTHROPLASTY     TUBAL LIGATION Bilateral 1970   VAGINAL HYSTERECTOMY  1977   myomata   VIDEO ASSISTED THORACOSCOPY (VATS)/WEDGE RESECTION Right 01/05/2016   Procedure: RIGHT VIDEO ASSISTED THORACOSCOPY (VATS)/WEDGE RESECTION;  Surgeon: Melrose Nakayama, MD;  Location: [facility];  Service: Thoracic;  Laterality: Right;    Allergies  Allergen Reactions   Aspirin Rash    Allergies as of 06/09/2020      Reactions   Aspirin Rash      Medication  List       Accurate as of June 09, 2020 11:59 PM. If you have any questions, ask your nurse or doctor.        acetaminophen 500 MG tablet Commonly known as: TYLENOL Take 2 tablets (1,000 mg total) by mouth every 6 (six) hours as needed for mild pain.   bisacodyl 5 MG EC tablet Commonly known as: DULCOLAX Take 1 tablet (5 mg total) by mouth daily as needed for severe constipation.   Caltrate 600+D 600-400 MG-UNIT tablet Generic drug: Calcium Carbonate-Vitamin D Take 1 tablet by mouth daily.   carboxymethylcellulose 0.5 % Soln Commonly known as: REFRESH PLUS Place 1 drop into both eyes 2 (two) times daily as needed (for dry eyes).   carvedilol 6.25 MG tablet Commonly known as: COREG Take 1 tablet (6.25 mg total) by mouth 2 (two) times daily with a meal.   Eliquis 5 MG Tabs tablet Generic drug: apixaban Take 5 mg by mouth 2 (two) times daily.   Estradiol 10 MCG Tabs vaginal tablet Commonly known as: Vagifem Place 1 tablet (10 mcg total) vaginally 2 (two) times a week.   feeding supplement (ENSURE ENLIVE) Liqd Take 237 mLs by mouth 2 (two) times daily between meals.   Fish Oil 1000 MG Caps Take 1 capsule by mouth 2 (two) times daily.   furosemide 20 MG tablet Commonly known as: LASIX Take 1 tablet (20 mg total) by mouth daily.   multivitamin with minerals Tabs tablet Take 1 tablet by mouth every evening.   pantoprazole 40 MG tablet Commonly known as: PROTONIX Take 40 mg by mouth daily.   polyethylene glycol 17 g packet Commonly known as: MIRALAX / GLYCOLAX Take 17 g by mouth daily as needed for moderate constipation.   rosuvastatin 10 MG tablet Commonly known as: CRESTOR Take 10 mg by mouth at bedtime.   Vitamin D2 10 MCG (400 UNIT) Tabs Take 400 Units by mouth daily.       Review of Systems  Constitutional: Negative for activity change, appetite change and fever.  HENT: Positive for hearing loss. Negative for congestion, trouble swallowing and voice  change.   Eyes: Negative for visual disturbance.  Respiratory: Negative for cough, shortness of breath and wheezing.   Gastrointestinal: Negative for abdominal distention, abdominal pain, constipation, nausea and vomiting.  Genitourinary: Positive for frequency. Negative for difficulty urinating, dysuria and urgency.  Musculoskeletal: Positive for arthralgias and gait problem.  Skin: Positive for color change.       Lateral left thigh bruise. Left forearm bruise.   Neurological: Negative for dizziness, speech difficulty, weakness and headaches.  Psychiatric/Behavioral: Negative for behavioral problems, hallucinations and sleep disturbance. The patient is not nervous/anxious.     Immunization History  Administered Date(s) Administered   Influenza-Unspecified 06/30/2016   Pneumococcal Polysaccharide-23 12/16/2001   Td 12/16/2001   Tdap 10/15/2009   Zoster 07/02/2006  Pertinent  Health Maintenance Due  Topic Date Due   PNA vac Low Risk Adult (2 of 2 - PCV13) 12/17/2002   INFLUENZA VACCINE  05/15/2020   DEXA SCAN  Completed   No flowsheet data found. Functional Status Survey:    Vitals:   06/09/20 1328  BP: 140/80  Pulse: 86  Resp: 20  Temp: 98.4 F (36.9 C)  SpO2: 91%  Weight: 145 lb (65.8 kg)  Height: 4\' 11"  (1.499 m)   Body mass index is 29.29 kg/m. Physical Exam Vitals and nursing note reviewed.  Constitutional:      General: She is not in acute distress.    Appearance: Normal appearance. She is not ill-appearing, toxic-appearing or diaphoretic.  HENT:     Head: Normocephalic and atraumatic.     Mouth/Throat:     Mouth: Mucous membranes are moist.  Eyes:     Extraocular Movements: Extraocular movements intact.     Conjunctiva/sclera: Conjunctivae normal.     Pupils: Pupils are equal, round, and reactive to light.  Cardiovascular:     Rate and Rhythm: Normal rate and regular rhythm.     Heart sounds: No murmur heard.   Pulmonary:     Effort:  Pulmonary effort is normal.     Breath sounds: No wheezing, rhonchi or rales.  Abdominal:     General: Bowel sounds are normal. There is no distension.     Palpations: Abdomen is soft.     Tenderness: There is no abdominal tenderness. There is no right CVA tenderness, left CVA tenderness, guarding or rebound.  Musculoskeletal:     Cervical back: Normal range of motion and neck supple.     Right lower leg: Edema present.     Left lower leg: Edema present.     Comments: Moderate   Skin:    Findings: Bruising present.     Comments: Large bruise lateral left thigh, multiple ecchymoses left forearm  Neurological:     General: No focal deficit present.     Mental Status: She is alert and oriented to person, place, and time. Mental status is at baseline.     Motor: No weakness.     Coordination: Coordination normal.     Gait: Gait abnormal.  Psychiatric:        Mood and Affect: Mood normal.        Behavior: Behavior normal.        Thought Content: Thought content normal.        Judgment: Judgment normal.     Labs reviewed: Recent Labs    06/02/20 0117 06/02/20 0925 06/03/20 0132 06/03/20 1109 06/04/20 0237 06/04/20 0804 06/06/20 0755 06/06/20 0755 06/06/20 1543 06/07/20 1020 06/08/20 0719  NA 119*   < > 118*   < > 117*   < > 133*   < > 129* 130* 129*  K 5.1   < > 4.5   < > 5.1   < > 4.7  --   --  5.0 4.7  CL 81*   < > 84*   < > 84*   < > 93*  --   --  91* 87*  CO2 27   < > 27   < > 18*   < > 30  --   --  29 30  GLUCOSE 129*   < > 119*   < > 102*   < > 115*  --   --  102* 107*  BUN 13   < >  22   < > 40*   < > 36*  --   --  27* 28*  CREATININE 0.94   < > 1.14*   < > 1.15*   < > 0.99  --   --  0.87 1.06*  CALCIUM 8.4*   < > 8.2*   < > 8.8*   < > 9.3  --   --  8.9 9.3  MG 1.5*  --  2.0  --  2.2  --   --   --   --   --   --   PHOS  --   --  3.5  --   --   --   --   --   --  4.0 4.2   < > = values in this interval not displayed.   Recent Labs    06/02/20 0117 06/02/20 0117  06/03/20 0132 06/07/20 1020 06/08/20 0719  AST 32  --  23  --   --   ALT 17  --  17  --   --   ALKPHOS 67  --  59  --   --   BILITOT 1.7*  --  0.9  --   --   PROT 5.9*  --  5.3*  --   --   ALBUMIN 2.9*   < > 2.5* 2.6* 3.0*   < > = values in this interval not displayed.   Recent Labs    05/30/20 1249 06/03/20 0132 06/04/20 1520  WBC 7.4 8.2 9.5  NEUTROABS 6.5  --   --   HGB 11.3* 10.7* 11.2*  HCT 34.3* 32.6* 33.6*  MCV 93.7 93.7 93.6  PLT 196 189 219   Lab Results  Component Value Date   TSH 1.293 06/02/2020   No results found for: HGBA1C Lab Results  Component Value Date   CHOL 201 (HH) 04/09/2007   HDL 65.9 04/09/2007   LDLDIRECT 107.8 04/09/2007   TRIG 50 04/09/2007   CHOLHDL 3.1 CALC 04/09/2007    Significant Diagnostic Results in last 30 days:  DG Knee 1-2 Views Left  Result Date: 06/02/2020 CLINICAL DATA:  Lower extremity swelling EXAM: LEFT KNEE - 1-2 VIEW COMPARISON:  None. FINDINGS: No acute fracture or dislocation. Severe osteoarthritis of the left knee most pronounced within the lateral and patellofemoral compartments. Moderate-sized knee joint effusion. Mineralized loose body posterior to the knee. Chondrocalcinosis. Mild soft tissue edema. IMPRESSION: 1. No acute fracture or dislocation. 2. Severe osteoarthritis of the left knee with moderate-sized knee joint effusion. Electronically Signed   By: Davina Poke D.O.   On: 06/02/2020 07:58   DG Chest Portable 1 View  Result Date: 05/30/2020 CLINICAL DATA:  Cough and shortness of breath for the past week. History of lung cancer. EXAM: PORTABLE CHEST 1 VIEW COMPARISON:  CT chest dated Mar 03, 2020. Chest x-ray dated July 17, 2016. FINDINGS: Stable cardiomegaly. New bilateral perihilar interstitial hazy airspace opacities. Postsurgical changes in the right lung. Unchanged chronic small right pleural effusion. No pneumothorax. No acute osseous abnormality. IMPRESSION: 1. New bilateral perihilar interstitial and  hazy airspace opacities concerning for pulmonary edema or atypical infection. 2. Unchanged chronic small right pleural effusion. Electronically Signed   By: Titus Dubin M.D.   On: 05/30/2020 13:06   ECHOCARDIOGRAM COMPLETE  Result Date: 06/02/2020    ECHOCARDIOGRAM REPORT   Patient Name:   [name] Date of Exam: 06/02/2020 Medical Rec #:  [id number]     Height:  59.0 in Accession #:    [id number]    Weight:       137.1 lb Date of Birth:  10-07-1934     BSA:          1.571 m Patient Age:    86 years      BP:           103/58 mmHg Patient Gender: F             HR:           75 bpm. Exam Location:  Inpatient Procedure: 2D Echo Indications:    acute diastolic CHF 846.65  History:        Patient has prior history of Echocardiogram examinations, most                 recent 02/03/2020. CHF, Arrythmias:Atrial Fibrillation; Risk                 Factors:Hypertension.  Sonographer:    Johny Chess Referring Phys: [id number] [place]  1. Left ventricular ejection fraction, by estimation, is 60 to 65%. The left ventricle has normal function. The left ventricle has no regional wall motion abnormalities. There is mild left ventricular hypertrophy. Left ventricular diastolic parameters are indeterminate.  2. Right ventricular systolic function is normal. The right ventricular size is normal. There is normal pulmonary artery systolic pressure.  3. Left atrial size was moderately dilated.  4. Right atrial size was severely dilated.  5. The mitral valve is abnormal. Mild mitral valve regurgitation.  6. Tricuspid valve regurgitation is mild to moderate.  7. The aortic valve is abnormal. Aortic valve regurgitation is mild. Mild aortic valve sclerosis is present, with no evidence of aortic valve stenosis. Comparison(s): The left ventricular function is unchanged. FINDINGS  Left Ventricle: Left ventricular ejection fraction, by estimation, is 60 to 65%. The left ventricle has normal function. The left  ventricle has no regional wall motion abnormalities. The left ventricular internal cavity size was normal in size. There is  mild left ventricular hypertrophy. Left ventricular diastolic parameters are indeterminate. Right Ventricle: The right ventricular size is normal. Right vetricular wall thickness was not assessed. Right ventricular systolic function is normal. There is normal pulmonary artery systolic pressure. The tricuspid regurgitant velocity is 2.85 m/s, and with an assumed right atrial pressure of 3 mmHg, the estimated right ventricular systolic pressure is 77.9 mmHg. Left Atrium: Left atrial size was moderately dilated. Right Atrium: Right atrial size was severely dilated. Pericardium: There is no evidence of pericardial effusion. Mitral Valve: The mitral valve is abnormal. There is mild thickening of the mitral valve leaflet(s). Mild mitral annular calcification. Mild mitral valve regurgitation. Tricuspid Valve: The tricuspid valve is normal in structure. Tricuspid valve regurgitation is mild to moderate. Aortic Valve: The aortic valve is abnormal. Aortic valve regurgitation is mild. Mild aortic valve sclerosis is present, with no evidence of aortic valve stenosis. Pulmonic Valve: The pulmonic valve was not well visualized. Pulmonic valve regurgitation is trivial. Aorta: The aortic root and ascending aorta are structurally normal, with no evidence of dilitation. IAS/Shunts: No atrial level shunt detected by color flow Doppler.  LEFT VENTRICLE PLAX 2D LVIDd:         4.00 cm LVIDs:         2.50 cm LV PW:         0.70 cm LV IVS:        0.80 cm LVOT diam:     1.80 cm LV  SV:         41 LV SV Index:   26 LVOT Area:     2.54 cm  IVC IVC diam: 1.70 cm LEFT ATRIUM             Index       RIGHT ATRIUM           Index LA diam:        4.00 cm 2.55 cm/m  RA Area:     17.40 cm LA Vol (A2C):   60.8 ml 38.70 ml/m RA Volume:   42.10 ml  26.80 ml/m LA Vol (A4C):   59.7 ml 38.00 ml/m LA Biplane Vol: 62.7 ml 39.91  ml/m  AORTIC VALVE LVOT Vmax:   90.70 cm/s LVOT Vmean:  58.600 cm/s LVOT VTI:    0.163 m  AORTA Ao Root diam: 2.60 cm Ao Asc diam:  3.00 cm TRICUSPID VALVE TR Peak grad:   32.5 mmHg TR Vmax:        285.00 cm/s  SHUNTS Systemic VTI:  0.16 m Systemic Diam: 1.80 cm Dorris Carnes MD Electronically signed by Dorris Carnes MD Signature Date/Time: 06/02/2020/4:21:19 PM    Final    DG HIP UNILAT WITH PELVIS 2-3 VIEWS LEFT  Result Date: 06/02/2020 CLINICAL DATA:  Lower extremity edema EXAM: DG HIP (WITH OR WITHOUT PELVIS) 2-3V LEFT COMPARISON:  04/18/2013 FINDINGS: Status post left total hip arthroplasty. Arthroplasty components are in their expected alignment without evidence of periprosthetic lucency or fracture. Partially visualized right total hip arthroplasty, grossly intact. Bones are diffusely demineralized. Partially visualized lumbar spondylosis. IMPRESSION: Status post left total hip arthroplasty without evidence of hardware complication. Electronically Signed   By: Davina Poke D.O.   On: 06/02/2020 07:57   [place] EKG SITE RITE  Result Date: 06/04/2020 If Site Rite image not attached, placement could not be confirmed due to current cardiac rhythm.   Assessment/Plan: Slow transit constipation No BM>2 days, prn Bisacodyl is not adequate, will add MiraLax qd. Observe.   Multiple ecchymoses of thigh Lateral left thigh from trauma, no s/s of developing cellulitis, some ecchymotic spots left fore arm.    Acute exacerbation of congestive heart failure ([place]) Hospitalized 05/30/20-06/08/20 for acute on CHF, on low dose of Furosemide,  hyponatremia, 129 upon dc, on 1200cc/day fluid restriction, f/u BMP 06/13/20, pending Nephrology, eGFR 49 06/08/20 chronic edema BLE, takes Furosemide, EF 60-65%   Essential hypertension Blood pressure is controlled, continue Coreg, Rosuvastatin  Atrial fibrillation, chronic (HCC) Heart rte is in control, continue Coreg ,Eliquis.   GERD (gastroesophageal reflux  disease) Stable, continue Pantoprazole.   Atrophic vaginitis Continue vaginal Estradiol.     Family/ staff Communication: plan of care reviewed with the patient and charge nurse.   Labs/tests ordered:  Pending CBC, CMP  Time spend 35 minutes.

## 2020-06-09 NOTE — Assessment & Plan Note (Signed)
Blood pressure is controlled, continue Coreg, Rosuvastatin

## 2020-06-09 NOTE — Assessment & Plan Note (Signed)
Lateral left thigh from trauma, no s/s of developing cellulitis, some ecchymotic spots left fore arm.

## 2020-06-09 NOTE — Assessment & Plan Note (Signed)
Continue vaginal Estradiol.

## 2020-06-10 ENCOUNTER — Encounter: Payer: Self-pay | Admitting: Nurse Practitioner

## 2020-06-13 ENCOUNTER — Encounter: Payer: Self-pay | Admitting: Nurse Practitioner

## 2020-06-13 ENCOUNTER — Non-Acute Institutional Stay (SKILLED_NURSING_FACILITY): Payer: Medicare Other | Admitting: Nurse Practitioner

## 2020-06-13 DIAGNOSIS — I10 Essential (primary) hypertension: Secondary | ICD-10-CM | POA: Diagnosis not present

## 2020-06-13 DIAGNOSIS — K219 Gastro-esophageal reflux disease without esophagitis: Secondary | ICD-10-CM

## 2020-06-13 DIAGNOSIS — R0789 Other chest pain: Secondary | ICD-10-CM | POA: Diagnosis not present

## 2020-06-13 DIAGNOSIS — I5033 Acute on chronic diastolic (congestive) heart failure: Secondary | ICD-10-CM

## 2020-06-13 DIAGNOSIS — E871 Hypo-osmolality and hyponatremia: Secondary | ICD-10-CM

## 2020-06-13 DIAGNOSIS — N952 Postmenopausal atrophic vaginitis: Secondary | ICD-10-CM

## 2020-06-13 DIAGNOSIS — I482 Chronic atrial fibrillation, unspecified: Secondary | ICD-10-CM | POA: Diagnosis not present

## 2020-06-13 DIAGNOSIS — C7A090 Malignant carcinoid tumor of the bronchus and lung: Secondary | ICD-10-CM

## 2020-06-13 DIAGNOSIS — J9601 Acute respiratory failure with hypoxia: Secondary | ICD-10-CM

## 2020-06-13 DIAGNOSIS — K5901 Slow transit constipation: Secondary | ICD-10-CM

## 2020-06-13 NOTE — Progress Notes (Signed)
Location:   [facility] Room Number: 17 Place of Service:  SNF (31) Provider: Marlana Latus NP  Marton Redwood, MD  Patient Care Team: Marton Redwood, MD as PCP - General (Internal Medicine) O'Neal, Cassie Freer, MD as PCP - Cardiology (Cardiology) Megan Salon, MD (Gynecology) Eston Esters, MD (Inactive) (Hematology and Oncology) Laurence Spates, MD (Inactive) (Gastroenterology)  Extended Emergency Contact Information Primary Emergency Contact: Brickey,[name] Address: [address]          [place], [place] [address] [name] of [place] Phone: [phone] Relation: Son Secondary Emergency Contact: [name],[name] Address: [address]          [place], [place] [address] [name] of [place] Phone: [phone] Mobile Phone: [phone] Relation: Daughter  Code Status: DNR Goals of care: Advanced Directive information Advanced Directives 05/30/2020  Does Patient Have a Medical Advance Directive? Yes  Type of Advance Directive -  Does patient want to make changes to medical advance directive? -  Copy of [place] in Chart? -  Would patient like information on creating a medical advance directive? -  Pre-existing out of facility DNR order (yellow form or pink MOST form) -     Chief Complaint  Patient presents with  . Acute Visit    c/o feeling like a band wrapped around her chest 06/12/20     HPI:  Pt is a 86 y.o. female seen today for an acute visit for  feeling like a band wrapped around her chest 06/12/20. Denied chest pain, palpitation, HA, change of vision, nausea, vomiting, dysuria, abd pain, or constipation. Afebrile.     Hospitalized 05/30/20-06/08/20 for acute on CHF, on low dose of Furosemide.   Hyponatremia, 129 upon dc, on 1200cc/day fluid restriction, f/u BMP 06/13/20, pending Nephrology, eGFR 49 06/08/20             HTN, takes Coreg, Rosuvastatin             Afib, takes Coreg, Eliquis              CHF, chronic edema BLE, takes Furosemide, EF 60-65%             Constipation, takes MiraLax, Bisacodyl.              Atrophic vaginitis, takes Estradiol vaginal tab             GERD takes Pantoprazole  Past Medical History:  Diagnosis Date  . Arthritis 1996  . Atrial fibrillation (HCC)    on coumadin  . Atrial tachycardia ([place])   . Cancer [facility]) 1988   breast cancer, R mastectomy, chemo  . 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 Diverticulitis 1999  . Femur fracture ([place]) 03/2013   left, non weight bearing for 2 1/2 weeks  . GERD (gastroesophageal reflux disease)   . Hemorrhoids 1991  . HTN (hypertension)   . Lymphedema of upper extremity    right arm  . Osteoporosis   . PONV (postoperative nausea and vomiting)   . Urinary, incontinence, stress female   . Vitamin D deficiency disease    Past Surgical History:  Procedure Laterality Date  . APPENDECTOMY    . BREAST BIOPSY  05/04/1987   Right Dr Annamaria Boots  . BREAST IMPLANT REMOVAL Right 06/05/10  . BUNIONECTOMY  1988  . carcinoid Right    Right lower lobectomy 2017  . COLONOSCOPY W/ POLYPECTOMY    . EYE  SURGERY  5/14 ; 6/14   Cataract Surgery  . JOINT REPLACEMENT     2002 left; 2009 right Hip, rt knee 2012  . KNEE ARTHROPLASTY Right 2012   [facility]  . LOBECTOMY Right 01/05/2016   Procedure: RIGHT LOWER LOBE LOBECTOMY;  Surgeon: Melrose Nakayama, MD;  Location: [facility];  Service: Thoracic;  Laterality: Right;  . MASTECTOMY PARTIAL / LUMPECTOMY W/ AXILLARY LYMPHADENECTOMY  04/05/1987   Right - Dr Annamaria Boots  . RESECTION OF MEDIASTINAL MASS N/A 01/05/2016   Procedure: RESECTION OF PLEURAL MASS, right;  Surgeon: Melrose Nakayama, MD;  Location: [facility];  Service: Thoracic;  Laterality: N/A;  . TONSILLECTOMY    . TOTAL HIP ARTHROPLASTY    . TUBAL LIGATION Bilateral 1970  . VAGINAL HYSTERECTOMY  1977   [place]  . VIDEO ASSISTED THORACOSCOPY (VATS)/WEDGE RESECTION Right 01/05/2016    Procedure: RIGHT VIDEO ASSISTED THORACOSCOPY (VATS)/WEDGE RESECTION;  Surgeon: Melrose Nakayama, MD;  Location: [place];  Service: Thoracic;  Laterality: Right;    Allergies  Allergen Reactions  . Aspirin Rash    Allergies as of 06/13/2020      Reactions   Aspirin Rash      Medication List       Accurate as of June 13, 2020 11:59 PM. If you have any questions, ask your nurse or doctor.        acetaminophen 500 MG tablet Commonly known as: TYLENOL Take 2 tablets (1,000 mg total) by mouth every 6 (six) hours as needed for mild pain.   bisacodyl 5 MG EC tablet Commonly known as: DULCOLAX Take 1 tablet (5 mg total) by mouth daily as needed for severe constipation.   Caltrate 600+D 600-400 MG-UNIT tablet Generic drug: Calcium Carbonate-Vitamin D Take 1 tablet by mouth daily.   carboxymethylcellulose 0.5 % Soln Commonly known as: REFRESH PLUS Place 1 drop into both eyes 2 (two) times daily as needed (for dry eyes).   carvedilol 6.25 MG tablet Commonly known as: COREG Take 1 tablet (6.25 mg total) by mouth 2 (two) times daily with a meal.   Eliquis 5 MG Tabs tablet Generic drug: apixaban Take 5 mg by mouth 2 (two) times daily.   Estradiol 10 MCG Tabs vaginal tablet Commonly known as: Vagifem Place 1 tablet (10 mcg total) vaginally 2 (two) times a week.   feeding supplement (ENSURE ENLIVE) Liqd Take 237 mLs by mouth 2 (two) times daily between meals.   Fish Oil 1000 MG Caps Take 1 capsule by mouth 2 (two) times daily.   furosemide 20 MG tablet Commonly known as: LASIX Take 1 tablet (20 mg total) by mouth daily.   multivitamin with minerals Tabs tablet Take 1 tablet by mouth every evening.   pantoprazole 40 MG tablet Commonly known as: PROTONIX Take 40 mg by mouth daily.   polyethylene glycol 17 g packet Commonly known as: MIRALAX / GLYCOLAX Take 17 g by mouth daily as needed for moderate constipation.   rosuvastatin 10 MG tablet Commonly known as:  CRESTOR Take 10 mg by mouth at bedtime.   Vitamin D2 10 MCG (400 UNIT) Tabs Take 400 Units by mouth daily.       Review of Systems  Constitutional: Negative for appetite change, fatigue and fever.  HENT: Positive for hearing loss. Negative for congestion, trouble swallowing and voice change.   Eyes: Negative for visual disturbance.  Respiratory: Positive for cough and chest tightness. Negative for shortness of breath and wheezing.  C/o a band feeling around chest under breasts. Hx of right lower lobectomy 2017. Chronic cough remains the same.   Gastrointestinal: Negative for abdominal pain, constipation, nausea and vomiting.  Genitourinary: Positive for frequency. Negative for difficulty urinating, dysuria and urgency.  Musculoskeletal: Positive for arthralgias and gait problem.  Skin: Positive for color change.       Lateral left thigh bruise. Left forearm bruise.   Neurological: Negative for dizziness, speech difficulty, weakness and headaches.  Psychiatric/Behavioral: Negative for behavioral problems, hallucinations and sleep disturbance. The patient is not nervous/anxious.     Immunization History  Administered Date(s) Administered  . Influenza-Unspecified 06/30/2016  . Pneumococcal Polysaccharide-23 12/16/2001  . Td 12/16/2001  . Tdap 10/15/2009  . Zoster 07/02/2006   Pertinent  Health Maintenance Due  Topic Date Due  . PNA vac Low Risk Adult (2 of 2 - PCV13) 12/17/2002  . INFLUENZA VACCINE  05/15/2020  . DEXA SCAN  Completed   No flowsheet data found. Functional Status Survey:    Vitals:   06/13/20 1448  BP: 122/76  Pulse: 79  Resp: 20  Temp: (!) 97.2 F (36.2 C)  SpO2: 96%  Weight: 145 lb 14.4 oz (66.2 kg)  Height: $Remove'4\' 11"'vneWbmi$  (1.499 m)   Body mass index is 29.47 kg/m. Physical Exam Vitals and nursing note reviewed.  Constitutional:      General: She is not in acute distress.    Appearance: Normal appearance. She is not ill-appearing, toxic-appearing  or diaphoretic.  HENT:     Head: Normocephalic and atraumatic.     Mouth/Throat:     Mouth: Mucous membranes are moist.  Eyes:     Extraocular Movements: Extraocular movements intact.     Conjunctiva/sclera: Conjunctivae normal.     Pupils: Pupils are equal, round, and reactive to light.  Cardiovascular:     Rate and Rhythm: Normal rate. Rhythm irregular.     Heart sounds: No murmur heard.   Pulmonary:     Breath sounds: Wheezing present. No rhonchi or rales.     Comments: Mild, expiratory wheezes right lower lung. O2. S/p right lung lobectomy 2017. Right lumpectomy 30 years ago Chest:     Chest wall: No tenderness.  Abdominal:     General: Bowel sounds are normal. There is no distension.     Palpations: Abdomen is soft.     Tenderness: There is no abdominal tenderness. There is no right CVA tenderness, left CVA tenderness, guarding or rebound.  Musculoskeletal:     Cervical back: Normal range of motion and neck supple.     Right lower leg: Edema present.     Left lower leg: Edema present.     Comments: Moderate   Skin:    Findings: Bruising present.     Comments: Large bruise lateral left thigh, multiple ecchymoses left forearm  Neurological:     General: No focal deficit present.     Mental Status: She is alert and oriented to person, place, and time. Mental status is at baseline.     Motor: No weakness.     Coordination: Coordination normal.     Gait: Gait abnormal.  Psychiatric:        Mood and Affect: Mood normal.        Behavior: Behavior normal.        Thought Content: Thought content normal.        Judgment: Judgment normal.     Labs reviewed: Recent Labs    06/02/20 0117 06/02/20 0925 06/03/20  0132 06/03/20 1109 06/04/20 0237 06/04/20 0804 06/06/20 0755 06/06/20 0755 06/06/20 1543 06/07/20 1020 06/08/20 0719  NA 119*   < > 118*   < > 117*   < > 133*   < > 129* 130* 129*  K 5.1   < > 4.5   < > 5.1   < > 4.7  --   --  5.0 4.7  CL 81*   < > 84*   < >  84*   < > 93*  --   --  91* 87*  CO2 27   < > 27   < > 18*   < > 30  --   --  29 30  GLUCOSE 129*   < > 119*   < > 102*   < > 115*  --   --  102* 107*  BUN 13   < > 22   < > 40*   < > 36*  --   --  27* 28*  CREATININE 0.94   < > 1.14*   < > 1.15*   < > 0.99  --   --  0.87 1.06*  CALCIUM 8.4*   < > 8.2*   < > 8.8*   < > 9.3  --   --  8.9 9.3  MG 1.5*  --  2.0  --  2.2  --   --   --   --   --   --   PHOS  --   --  3.5  --   --   --   --   --   --  4.0 4.2   < > = values in this interval not displayed.   Recent Labs    06/02/20 0117 06/02/20 0117 06/03/20 0132 06/07/20 1020 06/08/20 0719  AST 32  --  23  --   --   ALT 17  --  17  --   --   ALKPHOS 67  --  59  --   --   BILITOT 1.7*  --  0.9  --   --   PROT 5.9*  --  5.3*  --   --   ALBUMIN 2.9*   < > 2.5* 2.6* 3.0*   < > = values in this interval not displayed.   Recent Labs    05/30/20 1249 06/03/20 0132 06/04/20 1520  WBC 7.4 8.2 9.5  NEUTROABS 6.5  --   --   HGB 11.3* 10.7* 11.2*  HCT 34.3* 32.6* 33.6*  MCV 93.7 93.7 93.6  PLT 196 189 219   Lab Results  Component Value Date   TSH 1.293 06/02/2020   No results found for: HGBA1C Lab Results  Component Value Date   CHOL 201 (HH) 04/09/2007   HDL 65.9 04/09/2007   LDLDIRECT 107.8 04/09/2007   TRIG 50 04/09/2007   CHOLHDL 3.1 CALC 04/09/2007    Significant Diagnostic Results in last 30 days:  DG Knee 1-2 Views Left  Result Date: 06/02/2020 CLINICAL DATA:  Lower extremity swelling EXAM: LEFT KNEE - 1-2 VIEW COMPARISON:  None. FINDINGS: No acute fracture or dislocation. Severe osteoarthritis of the left knee most pronounced within the lateral and patellofemoral compartments. Moderate-sized knee joint effusion. Mineralized loose body posterior to the knee. Chondrocalcinosis. Mild soft tissue edema. IMPRESSION: 1. No acute fracture or dislocation. 2. Severe osteoarthritis of the left knee with moderate-sized knee joint effusion. Electronically Signed   By: Duanne Guess D.O.   On:  06/02/2020 07:58   DG Chest Portable 1 View  Result Date: 05/30/2020 CLINICAL DATA:  Cough and shortness of breath for the past week. History of lung cancer. EXAM: PORTABLE CHEST 1 VIEW COMPARISON:  CT chest dated Mar 03, 2020. Chest x-ray dated July 17, 2016. FINDINGS: Stable cardiomegaly. New bilateral perihilar interstitial hazy airspace opacities. Postsurgical changes in the right lung. Unchanged chronic small right pleural effusion. No pneumothorax. No acute osseous abnormality. IMPRESSION: 1. New bilateral perihilar interstitial and hazy airspace opacities concerning for pulmonary edema or atypical infection. 2. Unchanged chronic small right pleural effusion. Electronically Signed   By: Obie Dredge M.D.   On: 05/30/2020 13:06   ECHOCARDIOGRAM COMPLETE  Result Date: 06/02/2020    ECHOCARDIOGRAM REPORT   Patient Name:   EMRY TOBIN Date of Exam: 06/02/2020 Medical Rec #:  [id number]     Height:       59.0 in Accession #:    [id number]    Weight:       137.1 lb Date of Birth:  June 15, 1934     BSA:          1.571 m Patient Age:    85 years      BP:           103/58 mmHg Patient Gender: F             HR:           75 bpm. Exam Location:  Inpatient Procedure: 2D Echo Indications:    acute diastolic CHF 428.31  History:        Patient has prior history of Echocardiogram examinations, most                 recent 02/03/2020. CHF, Arrythmias:Atrial Fibrillation; Risk                 Factors:Hypertension.  Sonographer:    Delcie Roch Referring Phys: [id number] VASUNDHRA RATHORE IMPRESSIONS  1. Left ventricular ejection fraction, by estimation, is 60 to 65%. The left ventricle has normal function. The left ventricle has no regional wall motion abnormalities. There is mild left ventricular hypertrophy. Left ventricular diastolic parameters are indeterminate.  2. Right ventricular systolic function is normal. The right ventricular size is normal. There is normal pulmonary artery systolic  pressure.  3. Left atrial size was moderately dilated.  4. Right atrial size was severely dilated.  5. The mitral valve is abnormal. Mild mitral valve regurgitation.  6. Tricuspid valve regurgitation is mild to moderate.  7. The aortic valve is abnormal. Aortic valve regurgitation is mild. Mild aortic valve sclerosis is present, with no evidence of aortic valve stenosis. Comparison(s): The left ventricular function is unchanged. FINDINGS  Left Ventricle: Left ventricular ejection fraction, by estimation, is 60 to 65%. The left ventricle has normal function. The left ventricle has no regional wall motion abnormalities. The left ventricular internal cavity size was normal in size. There is  mild left ventricular hypertrophy. Left ventricular diastolic parameters are indeterminate. Right Ventricle: The right ventricular size is normal. Right vetricular wall thickness was not assessed. Right ventricular systolic function is normal. There is normal pulmonary artery systolic pressure. The tricuspid regurgitant velocity is 2.85 m/s, and with an assumed right atrial pressure of 3 mmHg, the estimated right ventricular systolic pressure is 35.5 mmHg. Left Atrium: Left atrial size was moderately dilated. Right Atrium: Right atrial size was severely dilated. Pericardium: There is no evidence of pericardial effusion. Mitral Valve: The mitral valve is abnormal.  There is mild thickening of the mitral valve leaflet(s). Mild mitral annular calcification. Mild mitral valve regurgitation. Tricuspid Valve: The tricuspid valve is normal in structure. Tricuspid valve regurgitation is mild to moderate. Aortic Valve: The aortic valve is abnormal. Aortic valve regurgitation is mild. Mild aortic valve sclerosis is present, with no evidence of aortic valve stenosis. Pulmonic Valve: The pulmonic valve was not well visualized. Pulmonic valve regurgitation is trivial. Aorta: The aortic root and ascending aorta are structurally normal, with no  evidence of dilitation. IAS/Shunts: No atrial level shunt detected by color flow Doppler.  LEFT VENTRICLE PLAX 2D LVIDd:         4.00 cm LVIDs:         2.50 cm LV PW:         0.70 cm LV IVS:        0.80 cm LVOT diam:     1.80 cm LV SV:         41 LV SV Index:   26 LVOT Area:     2.54 cm  IVC IVC diam: 1.70 cm LEFT ATRIUM             Index       RIGHT ATRIUM           Index LA diam:        4.00 cm 2.55 cm/m  RA Area:     17.40 cm LA Vol (A2C):   60.8 ml 38.70 ml/m RA Volume:   42.10 ml  26.80 ml/m LA Vol (A4C):   59.7 ml 38.00 ml/m LA Biplane Vol: 62.7 ml 39.91 ml/m  AORTIC VALVE LVOT Vmax:   90.70 cm/s LVOT Vmean:  58.600 cm/s LVOT VTI:    0.163 m  AORTA Ao Root diam: 2.60 cm Ao Asc diam:  3.00 cm TRICUSPID VALVE TR Peak grad:   32.5 mmHg TR Vmax:        285.00 cm/s  SHUNTS Systemic VTI:  0.16 m Systemic Diam: 1.80 cm Dorris Carnes MD Electronically signed by Dorris Carnes MD Signature Date/Time: 06/02/2020/4:21:19 PM    Final    DG HIP UNILAT WITH PELVIS 2-3 VIEWS LEFT  Result Date: 06/02/2020 CLINICAL DATA:  Lower extremity edema EXAM: DG HIP (WITH OR WITHOUT PELVIS) 2-3V LEFT COMPARISON:  04/18/2013 FINDINGS: Status post left total hip arthroplasty. Arthroplasty components are in their expected alignment without evidence of periprosthetic lucency or fracture. Partially visualized right total hip arthroplasty, grossly intact. Bones are diffusely demineralized. Partially visualized lumbar spondylosis. IMPRESSION: Status post left total hip arthroplasty without evidence of hardware complication. Electronically Signed   By: Davina Poke D.O.   On: 06/02/2020 07:57   [place] EKG SITE RITE  Result Date: 06/04/2020 If Site Rite image not attached, placement could not be confirmed due to current cardiac rhythm.   Assessment/Plan: Sensation of chest pressure feeling like a band wrapped around her chest 06/12/20. Denied chest pain, palpitation, HA, change of vision, nausea, vomiting, dysuria, abd pain, or  constipation. Afebrile. Will update BNP, CBC/diff, CMP/eGFR, EKG, CXR to evaluate further.      Acute exacerbation of congestive heart failure ([place]) Hospitalized 05/30/20-06/08/20 for acute on CHF, on low dose of Furosemide. chronic edema BLE, takes Furosemide, EF 60-65%. Feels more swelling legs, even if tight around chest, the patient feels her abd is swelling too. Will weight 2x/wk.  Extra Furosemide $RemoveBeforeDE'20mg'[id number]$  po x1    Atrial fibrillation, chronic (HCC) Heart rate is in control, continue Coreg, Eliquis.   Essential hypertension Blood  pressure is controlled, continue Coreg.   GERD (gastroesophageal reflux disease) Stable, continue Pantoprazole.   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 CXR to evaluate further. Extra Furosemide $RemoveBeforeDE'20mg'[id number]$  x1 for increased edema in setting of c/o a band like pressure wrapped around chest under breast.     Family/ staff Communication: plan of care reviewed with the patient and charge nurse.   Labs/tests ordered:  CBC/diff, CMP/eGFR, CXR, EKG  Time spend 35 minutes.

## 2020-06-13 NOTE — Assessment & Plan Note (Addendum)
feeling like a band wrapped around her chest 06/12/20. Denied chest pain, palpitation, HA, change of vision, nausea, vomiting, dysuria, abd pain, or constipation. Afebrile. Will update BNP, CBC/diff, CMP/eGFR, EKG, CXR to evaluate further.

## 2020-06-13 NOTE — Assessment & Plan Note (Signed)
Stable, continue Pantoprazole.  

## 2020-06-13 NOTE — Assessment & Plan Note (Signed)
Heart rate is in control, continue Coreg, Eliquis.

## 2020-06-13 NOTE — Assessment & Plan Note (Signed)
Right lower lobectomy 12/2015

## 2020-06-13 NOTE — Assessment & Plan Note (Addendum)
Hospitalized 05/30/20-06/08/20 for acute on CHF, on low dose of Furosemide. chronic edema BLE, takes Furosemide, EF 60-65%. Feels more swelling legs, even if tight around chest, the patient feels her abd is swelling too. Will weight 2x/wk.  Extra Furosemide 20mg  po x1

## 2020-06-13 NOTE — Assessment & Plan Note (Signed)
Stable, continue Estradiol.  

## 2020-06-13 NOTE — Assessment & Plan Note (Signed)
Blood pressure is controlled, continue Coreg.

## 2020-06-13 NOTE — Assessment & Plan Note (Addendum)
Stable, last BM today, continue MiraLax, bisacodyl

## 2020-06-13 NOTE — Assessment & Plan Note (Signed)
O2 2lpm via [place], DuoNeb q8hr x 72 hours. CXR to evaluate further. Extra Furosemide 20mg  x1 for increased edema in setting of c/o a band like pressure wrapped around chest under breast.

## 2020-06-13 NOTE — Assessment & Plan Note (Signed)
129 upon hospital dc, on 1200cc/day fluid restriction, f/u BMP 06/13/20, pending Nephrology, eGFR 49 06/08/20

## 2020-06-14 ENCOUNTER — Non-Acute Institutional Stay (SKILLED_NURSING_FACILITY): Payer: Medicare Other | Admitting: Internal Medicine

## 2020-06-14 ENCOUNTER — Encounter: Payer: Self-pay | Admitting: Internal Medicine

## 2020-06-14 ENCOUNTER — Encounter: Payer: Self-pay | Admitting: Nurse Practitioner

## 2020-06-14 DIAGNOSIS — C7A090 Malignant carcinoid tumor of the bronchus and lung: Secondary | ICD-10-CM

## 2020-06-14 DIAGNOSIS — I482 Chronic atrial fibrillation, unspecified: Secondary | ICD-10-CM | POA: Diagnosis not present

## 2020-06-14 DIAGNOSIS — E871 Hypo-osmolality and hyponatremia: Secondary | ICD-10-CM

## 2020-06-14 DIAGNOSIS — I5033 Acute on chronic diastolic (congestive) heart failure: Secondary | ICD-10-CM

## 2020-06-14 DIAGNOSIS — N952 Postmenopausal atrophic vaginitis: Secondary | ICD-10-CM | POA: Diagnosis not present

## 2020-06-14 LAB — CBC AND DIFFERENTIAL
HCT: 31 — AB (ref 36–46)
Hemoglobin: 10 — AB (ref 12.0–16.0)
Neutrophils Absolute: 4799
Platelets: 245 (ref 150–399)
WBC: 5.6

## 2020-06-14 LAB — BRAIN NATRIURETIC PEPTIDE: B Natriuretic Peptide: 425

## 2020-06-14 LAB — HEPATIC FUNCTION PANEL
ALT: 22 (ref 7–35)
AST: 27 (ref 13–35)
Alkaline Phosphatase: 67 (ref 25–125)
Bilirubin, Total: 1.1

## 2020-06-14 LAB — BASIC METABOLIC PANEL
BUN: 13 (ref 4–21)
CO2: 31 — AB (ref 13–22)
Chloride: 93 — AB (ref 99–108)
Creatinine: 0.6 (ref 0.5–1.1)
Glucose: 95
Potassium: 3.7 (ref 3.4–5.3)
Sodium: 134 — AB (ref 137–147)

## 2020-06-14 LAB — CBC: RBC: 3.24 — AB (ref 3.87–5.11)

## 2020-06-14 LAB — COMPREHENSIVE METABOLIC PANEL
Albumin: 3.2 — AB (ref 3.5–5.0)
Calcium: 8.3 — AB (ref 8.7–10.7)
Globulin: 2.3

## 2020-06-14 NOTE — Progress Notes (Signed)
Location:   [facility] Room Number: [place] of Service:  SNF [phone]) Provider:  Veleta Miners MD  Marton Redwood, MD  Patient Care Team: Marton Redwood, MD as PCP - General (Internal Medicine) O'Neal, Cassie Freer, MD as PCP - Cardiology (Cardiology) Megan Salon, MD (Gynecology) Eston Esters, MD (Inactive) (Hematology and Oncology) Laurence Spates, MD (Inactive) (Gastroenterology)  Extended Emergency Contact Information Primary Emergency Contact: [name],Katie Address: [address]          [place], [place] [address] [name] of [place] Phone: [phone] Relation: Son Secondary Emergency Contact: [name],[name] Address: [address]          [place], [place] [address] [name] of [place] Phone: [phone] Mobile Phone: [phone] Relation: Daughter  Code Status:  Managed Care Goals of care: Advanced Directive information Advanced Directives 06/14/2020  Does Patient Have a Medical Advance Directive? Yes  Type of Advance Directive Living will;Healthcare Power of Attorney  Does patient want to make changes to medical advance directive? No - Patient declined  Copy of [name] in Chart? Yes - validated most recent copy scanned in chart (See row information)  Would patient like information on creating a medical advance directive? -  Pre-existing out of facility DNR order (yellow form or pink MOST form) -     Chief Complaint  Patient presents with  . New Admit To SNF    HPI:  Pt is a 86 y.o. female seen today for New Admit Patient was admitted in the hospital from 8/16-8/25 for hyponatremia and CHF    Patient has a history of chronic A. 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 Was driving and taking care of herself.  She started feeling weak, cough shortness of breath and nausea for few weeks and went to ED where they found her sodium to be 118 and BNP of 402. She was treated with tolvaptan, hypertonic solution and fluid restriction. Her hyponatremia is thought to be due to CHF. She was also diuresed with Lasix. Patient also grew VRE in her  urine and was treated with 1 dose of fosfomycin.  Yesterday patient was seen for acute onset of shortness of breath with dry cough. No fever or chest pain  worsening swelling in the legs. Was given an extra dose of Lasix. Labs were ordered. Her chest x-ray has now come today positive for bilateral infiltrate most likely CHF Her BNP is elevated at 425 Sodium is 134   Past Medical History:  Diagnosis Date  . Arthritis 1996  . Atrial fibrillation (HCC)    on coumadin  . Atrial tachycardia ([place])   . Cancer [facility]) 1988   breast cancer, R mastectomy, chemo  . 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 Diverticulitis 1999  . Femur fracture ([place]) 03/2013   left, non weight bearing for 2 1/2 weeks  . GERD (gastroesophageal reflux disease)   . Hemorrhoids 1991  . HTN (hypertension)   . Lymphedema of upper extremity    right arm  . Osteoporosis   . PONV (postoperative nausea and vomiting)   . Urinary, incontinence, stress female   . Vitamin  D deficiency disease    Past Surgical History:  Procedure Laterality Date  . APPENDECTOMY    . BREAST BIOPSY  05/04/1987   Right Dr Annamaria Boots  . BREAST IMPLANT REMOVAL Right 06/05/10  . BUNIONECTOMY  1988  . carcinoid Right    Right lower lobectomy 2017  . COLONOSCOPY W/ POLYPECTOMY    . EYE SURGERY  5/14 ; 6/14   Cataract Surgery  . JOINT REPLACEMENT     2002 left; 2009 right Hip, rt knee 2012  . KNEE ARTHROPLASTY Right 2012   [facility]  . LOBECTOMY Right  01/05/2016   Procedure: RIGHT LOWER LOBE LOBECTOMY;  Surgeon: Melrose Nakayama, MD;  Location: [facility];  Service: Thoracic;  Laterality: Right;  . MASTECTOMY PARTIAL / LUMPECTOMY W/ AXILLARY LYMPHADENECTOMY  04/05/1987   Right - Dr Annamaria Boots  . RESECTION OF MEDIASTINAL MASS N/A 01/05/2016   Procedure: RESECTION OF PLEURAL MASS, right;  Surgeon: Melrose Nakayama, MD;  Location: [facility];  Service: Thoracic;  Laterality: N/A;  . TONSILLECTOMY    . TOTAL HIP ARTHROPLASTY    . TUBAL LIGATION Bilateral 1970  . VAGINAL HYSTERECTOMY  1977   myomata  . VIDEO ASSISTED THORACOSCOPY (VATS)/WEDGE RESECTION Right 01/05/2016   Procedure: RIGHT VIDEO ASSISTED THORACOSCOPY (VATS)/WEDGE RESECTION;  Surgeon: Melrose Nakayama, MD;  Location: [facility];  Service: Thoracic;  Laterality: Right;    Allergies  Allergen Reactions  . Aspirin Rash    Allergies as of 06/14/2020      Reactions   Aspirin Rash      Medication List       Accurate as of June 14, 2020 10:25 PM. If you have any questions, ask your nurse or doctor.        acetaminophen 500 MG tablet Commonly known as: TYLENOL Take 2 tablets (1,000 mg total) by mouth every 6 (six) hours as needed for mild pain.   bisacodyl 5 MG EC tablet Commonly known as: DULCOLAX Take 1 tablet (5 mg total) by mouth daily as needed for severe constipation.   Caltrate 600+D 600-400 MG-UNIT tablet Generic drug: Calcium Carbonate-Vitamin D Take 1 tablet by mouth daily.   carboxymethylcellulose 0.5 % Soln Commonly known as: REFRESH PLUS Place 1 drop into both eyes 2 (two) times daily as needed (for dry eyes).   carvedilol 6.25 MG tablet Commonly known as: COREG Take 1 tablet (6.25 mg total) by mouth 2 (two) times daily with a meal.   Eliquis 5 MG Tabs tablet Generic drug: apixaban Take 5 mg by mouth 2 (two) times daily.   Estradiol 10 MCG Tabs vaginal tablet Commonly known as: Vagifem Place 1 tablet (10 mcg total) vaginally 2 (two) times a week.     feeding supplement (ENSURE ENLIVE) Liqd Take 237 mLs by mouth 2 (two) times daily between meals.   Fish Oil 1000 MG Caps Take 1 capsule by mouth 2 (two) times daily.   furosemide 20 MG tablet Commonly known as: LASIX Take 1 tablet (20 mg total) by mouth daily.   ipratropium-albuterol 0.5-2.5 (3) MG/3ML Soln Commonly known as: DUONEB Take 3 mLs by nebulization every 8 (eight) hours.   multivitamin with minerals Tabs tablet Take 1 tablet by mouth every evening.   pantoprazole 40 MG tablet Commonly known as: PROTONIX Take 40 mg by mouth daily.   polyethylene glycol 17 g packet Commonly known as: MIRALAX / GLYCOLAX Take 17 g by mouth daily as needed for moderate constipation.   rosuvastatin 10 MG tablet Commonly  known as: CRESTOR Take 10 mg by mouth at bedtime.   Vitamin D2 10 MCG (400 UNIT) Tabs Take 400 Units by mouth daily.       Review of Systems  Constitutional: Positive for activity change, appetite change and unexpected weight change.  HENT: Negative.   Respiratory: Positive for cough and shortness of breath.   Cardiovascular: Positive for leg swelling.  Gastrointestinal: Negative.   Genitourinary: Negative.   Musculoskeletal: Negative.   Skin: Negative.   Neurological: Positive for weakness.  Psychiatric/Behavioral: Negative.     Immunization History  Administered Date(s) Administered  . Influenza-Unspecified 06/30/2016  . Moderna SARS-COVID-2 Vaccination 10/19/2019, 11/16/2019  . Pneumococcal Polysaccharide-23 12/16/2001  . Pneumococcal-Unspecified 07/30/2008, 10/22/2014  . Td 12/16/2001  . Tdap 10/15/2009  . Tetanus 05/09/2010  . Zoster 07/02/2006   Pertinent  Health Maintenance Due  Topic Date Due  . PNA vac Low Risk Adult (2 of 2 - PCV13) 10/23/2015  . INFLUENZA VACCINE  05/15/2020  . DEXA SCAN  Completed   No flowsheet data found. Functional Status Survey:    Vitals:   06/14/20 1527  BP: 112/64  Pulse: 78  Resp: 20  Temp: 99.2 F  (37.3 C)  SpO2: 96%  Weight: 143 lb 11.2 oz (65.2 kg)  Height: 4\' 11"  (1.499 m)   Body mass index is 29.02 kg/m. Physical Exam Vitals reviewed.  Constitutional:      Appearance: Normal appearance.  HENT:     Head: Normocephalic.     Nose: Nose normal.     Mouth/Throat:     Mouth: Mucous membranes are moist.     Pharynx: Oropharynx is clear.  Eyes:     Pupils: Pupils are equal, round, and reactive to light.  Cardiovascular:     Rate and Rhythm: Rhythm irregular.     Pulses: Normal pulses.  Pulmonary:     Effort: Pulmonary effort is normal.     Breath sounds: Rales present.  Abdominal:     General: Abdomen is flat. Bowel sounds are normal.     Palpations: Abdomen is soft.  Musculoskeletal:        General: Swelling present.     Cervical back: Neck supple.  Skin:    General: Skin is warm and dry.  Neurological:     General: No focal deficit present.     Mental Status: She is alert and oriented to person, place, and time.  Psychiatric:        Mood and Affect: Mood normal.        Thought Content: Thought content normal.     Labs reviewed: Recent Labs    06/02/20 0117 06/02/20 0925 06/03/20 0132 06/03/20 1109 06/04/20 0237 06/04/20 0804 06/06/20 0755 06/06/20 1543 06/07/20 1020 06/08/20 0719 06/14/20 0000  NA 119*   < > 118*   < > 117*   < > 133*   < > 130* 129* 134*  K 5.1   < > 4.5   < > 5.1   < > 4.7  --  5.0 4.7 3.7  CL 81*   < > 84*   < > 84*   < > 93*  --  91* 87* 93*  CO2 27   < > 27   < > 18*   < > 30  --  29 30 31*  GLUCOSE 129*   < > 119*   < > 102*   < > 115*  --  102* 107*  --   BUN 13   < >  22   < > 40*   < > 36*  --  27* 28* 13  CREATININE 0.94   < > 1.14*   < > 1.15*   < > 0.99  --  0.87 1.06* 0.6  CALCIUM 8.4*   < > 8.2*   < > 8.8*   < > 9.3  --  8.9 9.3 8.3*  MG 1.5*  --  2.0  --  2.2  --   --   --   --   --   --   PHOS  --   --  3.5  --   --   --   --   --  4.0 4.2  --    < > = values in this interval not displayed.   Recent Labs     06/02/20 0117 06/02/20 0117 06/03/20 0132 06/03/20 0132 06/07/20 1020 06/08/20 0719 06/14/20 0000  AST 32  --  23  --   --   --  27  ALT 17  --  17  --   --   --  22  ALKPHOS 67  --  59  --   --   --  67  BILITOT 1.7*  --  0.9  --   --   --   --   PROT 5.9*  --  5.3*  --   --   --   --   ALBUMIN 2.9*   < > 2.5*   < > 2.6* 3.0* 3.2*   < > = values in this interval not displayed.   Recent Labs    05/30/20 1249 05/30/20 1249 06/03/20 0132 06/04/20 1520 06/14/20 0000  WBC 7.4   < > 8.2 9.5 5.6  NEUTROABS 6.5  --   --   --  4,799  HGB 11.3*   < > 10.7* 11.2* 10.0*  HCT 34.3*   < > 32.6* 33.6* 31*  MCV 93.7  --  93.7 93.6  --   PLT 196   < > 189 219 245   < > = values in this interval not displayed.   Lab Results  Component Value Date   TSH 1.293 06/02/2020   No results found for: HGBA1C Lab Results  Component Value Date   CHOL 201 (HH) 04/09/2007   HDL 65.9 04/09/2007   LDLDIRECT 107.8 04/09/2007   TRIG 50 04/09/2007   CHOLHDL 3.1 CALC 04/09/2007    Significant Diagnostic Results in last 30 days:  DG Knee 1-2 Views Left  Result Date: 06/02/2020 CLINICAL DATA:  Lower extremity swelling EXAM: LEFT KNEE - 1-2 VIEW COMPARISON:  None. FINDINGS: No acute fracture or dislocation. Severe osteoarthritis of the left knee most pronounced within the lateral and patellofemoral compartments. Moderate-sized knee joint effusion. Mineralized loose body posterior to the knee. Chondrocalcinosis. Mild soft tissue edema. IMPRESSION: 1. No acute fracture or dislocation. 2. Severe osteoarthritis of the left knee with moderate-sized knee joint effusion. Electronically Signed   By: Davina Poke D.O.   On: 06/02/2020 07:58   DG Chest Portable 1 View  Result Date: 05/30/2020 CLINICAL DATA:  Cough and shortness of breath for the past week. History of lung cancer. EXAM: PORTABLE CHEST 1 VIEW COMPARISON:  CT chest dated Mar 03, 2020. Chest x-ray dated July 17, 2016. FINDINGS: Stable  cardiomegaly. New bilateral perihilar interstitial hazy airspace opacities. Postsurgical changes in the right lung. Unchanged chronic small right pleural effusion. No pneumothorax. No acute osseous abnormality. IMPRESSION: 1. New bilateral perihilar  interstitial and hazy airspace opacities concerning for pulmonary edema or atypical infection. 2. Unchanged chronic small right pleural effusion. Electronically Signed   By: Titus Dubin M.D.   On: 05/30/2020 13:06   ECHOCARDIOGRAM COMPLETE  Result Date: 06/02/2020    ECHOCARDIOGRAM REPORT   Patient Name:   [name] Date of Exam: 06/02/2020 Medical Rec #:  [id number]     Height:       59.0 in Accession #:    [id number]    Weight:       137.1 lb Date of Birth:  [birth year]     BSA:          1.571 m Patient Age:    17 years      BP:           103/58 mmHg Patient Gender: F             HR:           75 bpm. Exam Location:  Inpatient Procedure: 2D Echo Indications:    acute diastolic CHF 712.45  History:        Patient has prior history of Echocardiogram examinations, most                 recent 02/03/2020. CHF, Arrythmias:Atrial Fibrillation; Risk                 Factors:Hypertension.  Sonographer:    Johny Chess Referring Phys: [id number] [place]  1. Left ventricular ejection fraction, by estimation, is 60 to 65%. The left ventricle has normal function. The left ventricle has no regional wall motion abnormalities. There is mild left ventricular hypertrophy. Left ventricular diastolic parameters are indeterminate.  2. Right ventricular systolic function is normal. The right ventricular size is normal. There is normal pulmonary artery systolic pressure.  3. Left atrial size was moderately dilated.  4. Right atrial size was severely dilated.  5. The mitral valve is abnormal. Mild mitral valve regurgitation.  6. Tricuspid valve regurgitation is mild to moderate.  7. The aortic valve is abnormal. Aortic valve regurgitation is mild. Mild aortic  valve sclerosis is present, with no evidence of aortic valve stenosis. Comparison(s): The left ventricular function is unchanged. FINDINGS  Left Ventricle: Left ventricular ejection fraction, by estimation, is 60 to 65%. The left ventricle has normal function. The left ventricle has no regional wall motion abnormalities. The left ventricular internal cavity size was normal in size. There is  mild left ventricular hypertrophy. Left ventricular diastolic parameters are indeterminate. Right Ventricle: The right ventricular size is normal. Right vetricular wall thickness was not assessed. Right ventricular systolic function is normal. There is normal pulmonary artery systolic pressure. The tricuspid regurgitant velocity is 2.85 m/s, and with an assumed right atrial pressure of 3 mmHg, the estimated right ventricular systolic pressure is 82.5 mmHg. Left Atrium: Left atrial size was moderately dilated. Right Atrium: Right atrial size was severely dilated. Pericardium: There is no evidence of pericardial effusion. Mitral Valve: The mitral valve is abnormal. There is mild thickening of the mitral valve leaflet(s). Mild mitral annular calcification. Mild mitral valve regurgitation. Tricuspid Valve: The tricuspid valve is normal in structure. Tricuspid valve regurgitation is mild to moderate. Aortic Valve: The aortic valve is abnormal. Aortic valve regurgitation is mild. Mild aortic valve sclerosis is present, with no evidence of aortic valve stenosis. Pulmonic Valve: The pulmonic valve was not well visualized. Pulmonic valve regurgitation is trivial. Aorta: The aortic root and ascending aorta are structurally  normal, with no evidence of dilitation. IAS/Shunts: No atrial level shunt detected by color flow Doppler.  LEFT VENTRICLE PLAX 2D LVIDd:         4.00 cm LVIDs:         2.50 cm LV PW:         0.70 cm LV IVS:        0.80 cm LVOT diam:     1.80 cm LV SV:         41 LV SV Index:   26 LVOT Area:     2.54 cm  IVC IVC diam:  1.70 cm LEFT ATRIUM             Index       RIGHT ATRIUM           Index LA diam:        4.00 cm 2.55 cm/m  RA Area:     17.40 cm LA Vol (A2C):   60.8 ml 38.70 ml/m RA Volume:   42.10 ml  26.80 ml/m LA Vol (A4C):   59.7 ml 38.00 ml/m LA Biplane Vol: 62.7 ml 39.91 ml/m  AORTIC VALVE LVOT Vmax:   90.70 cm/s LVOT Vmean:  58.600 cm/s LVOT VTI:    0.163 m  AORTA Ao Root diam: 2.60 cm Ao Asc diam:  3.00 cm TRICUSPID VALVE TR Peak grad:   32.5 mmHg TR Vmax:        285.00 cm/s  SHUNTS Systemic VTI:  0.16 m Systemic Diam: 1.80 cm Dorris Carnes MD Electronically signed by Dorris Carnes MD Signature Date/Time: 06/02/2020/4:21:19 PM    Final    DG HIP UNILAT WITH PELVIS 2-3 VIEWS LEFT  Result Date: 06/02/2020 CLINICAL DATA:  Lower extremity edema EXAM: DG HIP (WITH OR WITHOUT PELVIS) 2-3V LEFT COMPARISON:  04/18/2013 FINDINGS: Status post left total hip arthroplasty. Arthroplasty components are in their expected alignment without evidence of periprosthetic lucency or fracture. Partially visualized right total hip arthroplasty, grossly intact. Bones are diffusely demineralized. Partially visualized lumbar spondylosis. IMPRESSION: Status post left total hip arthroplasty without evidence of hardware complication. Electronically Signed   By: Davina Poke D.O.   On: 06/02/2020 07:57   [place] EKG SITE RITE  Result Date: 06/04/2020 If Site Rite image not attached, placement could not be confirmed due to current cardiac rhythm.   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

## 2020-06-17 ENCOUNTER — Non-Acute Institutional Stay (SKILLED_NURSING_FACILITY): Payer: Medicare Other | Admitting: Internal Medicine

## 2020-06-17 ENCOUNTER — Encounter: Payer: Self-pay | Admitting: Internal Medicine

## 2020-06-17 DIAGNOSIS — I5033 Acute on chronic diastolic (congestive) heart failure: Secondary | ICD-10-CM | POA: Diagnosis not present

## 2020-06-17 DIAGNOSIS — K5901 Slow transit constipation: Secondary | ICD-10-CM

## 2020-06-17 DIAGNOSIS — I482 Chronic atrial fibrillation, unspecified: Secondary | ICD-10-CM | POA: Diagnosis not present

## 2020-06-17 DIAGNOSIS — I10 Essential (primary) hypertension: Secondary | ICD-10-CM

## 2020-06-17 DIAGNOSIS — E871 Hypo-osmolality and hyponatremia: Secondary | ICD-10-CM | POA: Diagnosis not present

## 2020-06-17 DIAGNOSIS — C7A090 Malignant carcinoid tumor of the bronchus and lung: Secondary | ICD-10-CM

## 2020-06-17 LAB — BASIC METABOLIC PANEL
BUN: 19 (ref 4–21)
CO2: 41 — AB (ref 13–22)
Chloride: 88 — AB (ref 99–108)
Creatinine: 1.1 (ref 0.5–1.1)
Glucose: 109
Potassium: 3.4 (ref 3.4–5.3)
Sodium: 138 (ref 137–147)

## 2020-06-17 LAB — CBC: RBC: 3.2 — AB (ref 3.87–5.11)

## 2020-06-17 LAB — CBC AND DIFFERENTIAL
HCT: 31 — AB (ref 36–46)
Hemoglobin: 10 — AB (ref 12.0–16.0)
Neutrophils Absolute: 4367
Platelets: 255 (ref 150–399)
WBC: 5.3

## 2020-06-17 LAB — COMPREHENSIVE METABOLIC PANEL: Calcium: 8.8 (ref 8.7–10.7)

## 2020-06-17 NOTE — Progress Notes (Addendum)
Location:   [place] Room Number: [place] of Service:  SNF [phone]) Provider:  Veleta Miners MD  Marton Redwood, MD  Patient Care Team: Marton Redwood, MD as PCP - General (Internal Medicine) O'Neal, Cassie Freer, MD as PCP - Cardiology (Cardiology) Megan Salon, MD (Gynecology) Eston Esters, MD (Inactive) (Hematology and Oncology) Laurence Spates, MD (Inactive) (Gastroenterology)  Extended Emergency Contact Information Primary Emergency Contact: Szumski,[name] Address: [address]          [place], [place] [address] [name] of [place] Phone: [phone] Relation: Son Secondary Emergency Contact: [name],[name] Address: [address]          [place], [place] [address] [name] of [place] Phone: [phone] Mobile Phone: [phone] Relation: Daughter  Code Status:  DNR Goals of care: Advanced Directive information Advanced Directives 06/14/2020  Does Patient Have a Medical Advance Directive? Yes  Type of Advance Directive Living will;Healthcare Power of Attorney  Does patient want to make changes to medical advance directive? No - Patient declined  Copy of [place] in Chart? Yes - validated most recent copy scanned in chart (See row information)  Would patient like information on creating a medical advance directive? -  Pre-existing out of facility DNR order (yellow form or pink MOST form) -     Chief Complaint  Patient presents with  . Acute Visit    Follow up    HPI:  Pt is a 86 y.o. female seen today for an acute visit for follow-up of shortness of breath, CHF, hyponatremia  Patient was admitted in the hospital from 8/16-8/25 for hyponatremia and CHF    Patient has a history of chronic A. 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 Was driving and taking care of herself.  She started feeling weak, cough shortness of breath and nausea for few weeks and went to ED where they found her sodium to be 118 and BNP of 402. She was treated with tolvaptan, hypertonic solution and fluid restriction. Her hyponatremia is thought to be due to CHF. She was also diuresed with Lasix. Patient also grew VRE in her  urine and was treated with 1 dose of fosfomycin.  Developed acute onset of shortness of breath in the facility.  Chest x-ray came back positive for CHF BNP elevated at 425 sodium at 134 Weight was up  Was started on Demadex 20 twice daily for 3 days and then 20 mg daily Patient is now doing very well.  Has lost almost 10 pounds.  Weight close to her baseline weight.  Still feels weak also complaining of sometimes feeling dizzy when sits straight in the bed.  Also has some edema in the legs. Also having complain of constipation But overall feeling better  Past Medical History:  Diagnosis Date  . Arthritis 1996  . Atrial fibrillation (HCC)    on coumadin  . Atrial tachycardia ([place])   . Cancer [facility]) 1988   breast cancer, R mastectomy, chemo  . 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 Diverticulitis 1999  . Femur fracture ([place]) 03/2013   left, non weight bearing for 2  1/2 weeks  . GERD (gastroesophageal reflux disease)   . Hemorrhoids 1991  . HTN (hypertension)   . Lymphedema of upper extremity    right arm  . Osteoporosis   . PONV (postoperative nausea and vomiting)   . Urinary, incontinence, stress female   . Vitamin D deficiency disease    Past Surgical History:  Procedure Laterality Date  . APPENDECTOMY    . BREAST BIOPSY  05/04/1987   Right Dr Annamaria Boots  . BREAST IMPLANT REMOVAL Right 06/05/10  . BUNIONECTOMY  1988  . carcinoid Right     Right lower lobectomy 2017  . COLONOSCOPY W/ POLYPECTOMY    . EYE SURGERY  5/14 ; 6/14   Cataract Surgery  . JOINT REPLACEMENT     2002 left; 2009 right Hip, rt knee 2012  . KNEE ARTHROPLASTY Right 2012   [facility]  . LOBECTOMY Right 01/05/2016   Procedure: RIGHT LOWER LOBE LOBECTOMY;  Surgeon: Melrose Nakayama, MD;  Location: [facility];  Service: Thoracic;  Laterality: Right;  . MASTECTOMY PARTIAL / LUMPECTOMY W/ AXILLARY LYMPHADENECTOMY  04/05/1987   Right - Dr Annamaria Boots  . RESECTION OF MEDIASTINAL MASS N/A 01/05/2016   Procedure: RESECTION OF PLEURAL MASS, right;  Surgeon: Melrose Nakayama, MD;  Location: [facility];  Service: Thoracic;  Laterality: N/A;  . TONSILLECTOMY    . TOTAL HIP ARTHROPLASTY    . TUBAL LIGATION Bilateral 1970  . VAGINAL HYSTERECTOMY  1977   myomata  . VIDEO ASSISTED THORACOSCOPY (VATS)/WEDGE RESECTION Right 01/05/2016   Procedure: RIGHT VIDEO ASSISTED THORACOSCOPY (VATS)/WEDGE RESECTION;  Surgeon: Melrose Nakayama, MD;  Location: [facility];  Service: Thoracic;  Laterality: Right;    Allergies  Allergen Reactions  . Aspirin Rash    Allergies as of 06/17/2020      Reactions   Aspirin Rash      Medication List       Accurate as of June 17, 2020  2:03 PM. If you have any questions, ask your nurse or doctor.        STOP taking these medications   ipratropium-albuterol 0.5-2.5 (3) MG/3ML Soln Commonly known as: DUONEB Stopped by: Virgie Dad, MD     TAKE these medications   acetaminophen 500 MG tablet Commonly known as: TYLENOL Take 2 tablets (1,000 mg total) by mouth every 6 (six) hours as needed for mild pain.   bisacodyl 5 MG EC tablet Commonly known as: DULCOLAX Take 1 tablet (5 mg total) by mouth daily as needed for severe constipation.   Caltrate 600+D 600-400 MG-UNIT tablet Generic drug: Calcium Carbonate-Vitamin D Take 1 tablet by mouth daily.   carboxymethylcellulose 0.5 % Soln Commonly known as: REFRESH PLUS Place 1 drop  into both eyes 2 (two) times daily as needed (for dry eyes).   carvedilol 6.25 MG tablet Commonly known as: COREG Take 1 tablet (6.25 mg total) by mouth 2 (two) times daily with a meal.   Eliquis 5 MG Tabs tablet Generic drug: apixaban Take 5 mg by mouth 2 (two) times daily.   Estradiol 10 MCG Tabs vaginal tablet Commonly known as: Vagifem Place 1 tablet (10 mcg total) vaginally 2 (two) times a week.   feeding supplement (ENSURE ENLIVE) Liqd Take 237 mLs by mouth 2 (two) times daily between meals.   ferrous sulfate 325 (65 FE) MG tablet Take 325 mg by mouth. Once A Day on Mon, Fri   Fish Oil 1000 MG Caps Take 1 capsule by mouth 2 (two) times  daily.   K-DUR PO Take 20 mEq by mouth daily.   multivitamin with minerals Tabs tablet Take 1 tablet by mouth every evening.   pantoprazole 40 MG tablet Commonly known as: PROTONIX Take 40 mg by mouth daily.   polyethylene glycol 17 g packet Commonly known as: MIRALAX / GLYCOLAX Take 17 g by mouth daily as needed for moderate constipation.   rosuvastatin 10 MG tablet Commonly known as: CRESTOR Take 10 mg by mouth at bedtime.   TORSEMIDE PO Take 20 mg by mouth. Take 20 mg BID for 3 days and then 20 mg QD   Vitamin D2 10 MCG (400 UNIT) Tabs Take 400 Units by mouth daily.       Review of Systems  Constitutional: Positive for activity change.  HENT: Negative.   Respiratory: Positive for cough and shortness of breath.   Cardiovascular: Positive for leg swelling.  Gastrointestinal: Positive for constipation.  Genitourinary: Negative.   Musculoskeletal: Positive for gait problem.  Skin: Negative.   Neurological: Positive for weakness.  Psychiatric/Behavioral: Negative.     Immunization History  Administered Date(s) Administered  . Influenza-Unspecified 06/30/2016  . Moderna SARS-COVID-2 Vaccination 10/19/2019, 11/16/2019  . Pneumococcal Polysaccharide-23 12/16/2001  . Pneumococcal-Unspecified 07/30/2008, 10/22/2014  .  Td 12/16/2001  . Tdap 10/15/2009  . Tetanus 05/09/2010  . Zoster 07/02/2006   Pertinent  Health Maintenance Due  Topic Date Due  . PNA vac Low Risk Adult (2 of 2 - PCV13) 10/23/2015  . INFLUENZA VACCINE  05/15/2020  . DEXA SCAN  Completed   No flowsheet data found. Functional Status Survey:    Vitals:   06/17/20 1358  BP: 116/60  Pulse: 82  Resp: 20  Temp: 98.9 F (37.2 C)  SpO2: 98%  Weight: 133 lb 9.6 oz (60.6 kg)  Height: 4\' 11"  (1.499 m)   Body mass index is 26.98 kg/m. Physical Exam Vitals reviewed.  Constitutional:      Appearance: Normal appearance.  HENT:     Head: Normocephalic.     Nose: Nose normal.     Mouth/Throat:     Mouth: Mucous membranes are dry.  Eyes:     Pupils: Pupils are equal, round, and reactive to light.  Cardiovascular:     Rate and Rhythm: Normal rate and regular rhythm.     Pulses: Normal pulses.  Pulmonary:     Effort: Pulmonary effort is normal. No respiratory distress.     Breath sounds: Normal breath sounds. No wheezing or rales.  Abdominal:     General: Abdomen is flat. Bowel sounds are normal.     Palpations: Abdomen is soft.  Musculoskeletal:        General: Swelling present.     Cervical back: Neck supple.  Skin:    General: Skin is warm.  Neurological:     General: No focal deficit present.     Mental Status: She is alert and oriented to person, place, and time.  Psychiatric:        Mood and Affect: Mood normal.        Thought Content: Thought content normal.     Labs reviewed: Recent Labs    06/02/20 0117 06/02/20 0925 06/03/20 0132 06/03/20 1109 06/04/20 0237 06/04/20 0804 06/06/20 0755 06/06/20 1543 06/07/20 1020 06/08/20 0719 06/14/20 0000  NA 119*   < > 118*   < > 117*   < > 133*   < > 130* 129* 134*  K 5.1   < > 4.5   < >  5.1   < > 4.7  --  5.0 4.7 3.7  CL 81*   < > 84*   < > 84*   < > 93*  --  91* 87* 93*  CO2 27   < > 27   < > 18*   < > 30  --  29 30 31*  GLUCOSE 129*   < > 119*   < > 102*    < > 115*  --  102* 107*  --   BUN 13   < > 22   < > 40*   < > 36*  --  27* 28* 13  CREATININE 0.94   < > 1.14*   < > 1.15*   < > 0.99  --  0.87 1.06* 0.6  CALCIUM 8.4*   < > 8.2*   < > 8.8*   < > 9.3  --  8.9 9.3 8.3*  MG 1.5*  --  2.0  --  2.2  --   --   --   --   --   --   PHOS  --   --  3.5  --   --   --   --   --  4.0 4.2  --    < > = values in this interval not displayed.   Recent Labs    06/02/20 0117 06/02/20 0117 06/03/20 0132 06/03/20 0132 06/07/20 1020 06/08/20 0719 06/14/20 0000  AST 32  --  23  --   --   --  27  ALT 17  --  17  --   --   --  22  ALKPHOS 67  --  59  --   --   --  67  BILITOT 1.7*  --  0.9  --   --   --   --   PROT 5.9*  --  5.3*  --   --   --   --   ALBUMIN 2.9*   < > 2.5*   < > 2.6* 3.0* 3.2*   < > = values in this interval not displayed.   Recent Labs    05/30/20 1249 05/30/20 1249 06/03/20 0132 06/04/20 1520 06/14/20 0000  WBC 7.4   < > 8.2 9.5 5.6  NEUTROABS 6.5  --   --   --  4,799  HGB 11.3*   < > 10.7* 11.2* 10.0*  HCT 34.3*   < > 32.6* 33.6* 31*  MCV 93.7  --  93.7 93.6  --   PLT 196   < > 189 219 245   < > = values in this interval not displayed.   Lab Results  Component Value Date   TSH 1.293 06/02/2020   No results found for: HGBA1C Lab Results  Component Value Date   CHOL 201 (HH) 04/09/2007   HDL 65.9 04/09/2007   LDLDIRECT 107.8 04/09/2007   TRIG 50 04/09/2007   CHOLHDL 3.1 CALC 04/09/2007    Significant Diagnostic Results in last 30 days:  DG Knee 1-2 Views Left  Result Date: 06/02/2020 CLINICAL DATA:  Lower extremity swelling EXAM: LEFT KNEE - 1-2 VIEW COMPARISON:  None. FINDINGS: No acute fracture or dislocation. Severe osteoarthritis of the left knee most pronounced within the lateral and patellofemoral compartments. Moderate-sized knee joint effusion. Mineralized loose body posterior to the knee. Chondrocalcinosis. Mild soft tissue edema. IMPRESSION: 1. No acute fracture or dislocation. 2. Severe osteoarthritis of  the left knee with moderate-sized knee joint effusion.  Electronically Signed   By: Davina Poke D.O.   On: 06/02/2020 07:58   DG Chest Portable 1 View  Result Date: 05/30/2020 CLINICAL DATA:  Cough and shortness of breath for the past week. History of lung cancer. EXAM: PORTABLE CHEST 1 VIEW COMPARISON:  CT chest dated Mar 03, 2020. Chest x-ray dated July 17, 2016. FINDINGS: Stable cardiomegaly. New bilateral perihilar interstitial hazy airspace opacities. Postsurgical changes in the right lung. Unchanged chronic small right pleural effusion. No pneumothorax. No acute osseous abnormality. IMPRESSION: 1. New bilateral perihilar interstitial and hazy airspace opacities concerning for pulmonary edema or atypical infection. 2. Unchanged chronic small right pleural effusion. Electronically Signed   By: Titus Dubin M.D.   On: 05/30/2020 13:06   ECHOCARDIOGRAM COMPLETE  Result Date: 06/02/2020    ECHOCARDIOGRAM REPORT   Patient Name:   [name] Date of Exam: 06/02/2020 Medical Rec #:  [id number]     Height:       59.0 in Accession #:    [id number]    Weight:       137.1 lb Date of Birth:  23-Sep-1934     BSA:          1.571 m Patient Age:    23 years      BP:           103/58 mmHg Patient Gender: F             HR:           75 bpm. Exam Location:  Inpatient Procedure: 2D Echo Indications:    acute diastolic CHF 431.54  History:        Patient has prior history of Echocardiogram examinations, most                 recent 02/03/2020. CHF, Arrythmias:Atrial Fibrillation; Risk                 Factors:Hypertension.  Sonographer:    Johny Chess Referring Phys: [id number] [place]  1. Left ventricular ejection fraction, by estimation, is 60 to 65%. The left ventricle has normal function. The left ventricle has no regional wall motion abnormalities. There is mild left ventricular hypertrophy. Left ventricular diastolic parameters are indeterminate.  2. Right ventricular systolic function  is normal. The right ventricular size is normal. There is normal pulmonary artery systolic pressure.  3. Left atrial size was moderately dilated.  4. Right atrial size was severely dilated.  5. The mitral valve is abnormal. Mild mitral valve regurgitation.  6. Tricuspid valve regurgitation is mild to moderate.  7. The aortic valve is abnormal. Aortic valve regurgitation is mild. Mild aortic valve sclerosis is present, with no evidence of aortic valve stenosis. Comparison(s): The left ventricular function is unchanged. FINDINGS  Left Ventricle: Left ventricular ejection fraction, by estimation, is 60 to 65%. The left ventricle has normal function. The left ventricle has no regional wall motion abnormalities. The left ventricular internal cavity size was normal in size. There is  mild left ventricular hypertrophy. Left ventricular diastolic parameters are indeterminate. Right Ventricle: The right ventricular size is normal. Right vetricular wall thickness was not assessed. Right ventricular systolic function is normal. There is normal pulmonary artery systolic pressure. The tricuspid regurgitant velocity is 2.85 m/s, and with an assumed right atrial pressure of 3 mmHg, the estimated right ventricular systolic pressure is 95.0 mmHg. Left Atrium: Left atrial size was moderately dilated. Right Atrium: Right atrial size was severely dilated. Pericardium: There is  no evidence of pericardial effusion. Mitral Valve: The mitral valve is abnormal. There is mild thickening of the mitral valve leaflet(s). Mild mitral annular calcification. Mild mitral valve regurgitation. Tricuspid Valve: The tricuspid valve is normal in structure. Tricuspid valve regurgitation is mild to moderate. Aortic Valve: The aortic valve is abnormal. Aortic valve regurgitation is mild. Mild aortic valve sclerosis is present, with no evidence of aortic valve stenosis. Pulmonic Valve: The pulmonic valve was not well visualized. Pulmonic valve regurgitation  is trivial. Aorta: The aortic root and ascending aorta are structurally normal, with no evidence of dilitation. IAS/Shunts: No atrial level shunt detected by color flow Doppler.  LEFT VENTRICLE PLAX 2D LVIDd:         4.00 cm LVIDs:         2.50 cm LV PW:         0.70 cm LV IVS:        0.80 cm LVOT diam:     1.80 cm LV SV:         41 LV SV Index:   26 LVOT Area:     2.54 cm  IVC IVC diam: 1.70 cm LEFT ATRIUM             Index       RIGHT ATRIUM           Index LA diam:        4.00 cm 2.55 cm/m  RA Area:     17.40 cm LA Vol (A2C):   60.8 ml 38.70 ml/m RA Volume:   42.10 ml  26.80 ml/m LA Vol (A4C):   59.7 ml 38.00 ml/m LA Biplane Vol: 62.7 ml 39.91 ml/m  AORTIC VALVE LVOT Vmax:   90.70 cm/s LVOT Vmean:  58.600 cm/s LVOT VTI:    0.163 m  AORTA Ao Root diam: 2.60 cm Ao Asc diam:  3.00 cm TRICUSPID VALVE TR Peak grad:   32.5 mmHg TR Vmax:        285.00 cm/s  SHUNTS Systemic VTI:  0.16 m Systemic Diam: 1.80 cm Dorris Carnes MD Electronically signed by Dorris Carnes MD Signature Date/Time: 06/02/2020/4:21:19 PM    Final    DG HIP UNILAT WITH PELVIS 2-3 VIEWS LEFT  Result Date: 06/02/2020 CLINICAL DATA:  Lower extremity edema EXAM: DG HIP (WITH OR WITHOUT PELVIS) 2-3V LEFT COMPARISON:  04/18/2013 FINDINGS: Status post left total hip arthroplasty. Arthroplasty components are in their expected alignment without evidence of periprosthetic lucency or fracture. Partially visualized right total hip arthroplasty, grossly intact. Bones are diffusely demineralized. Partially visualized lumbar spondylosis. IMPRESSION: Status post left total hip arthroplasty without evidence of hardware complication. Electronically Signed   By: Davina Poke D.O.   On: 06/02/2020 07:57   [place] EKG SITE RITE  Result Date: 06/04/2020 If Site Rite image not attached, placement could not be confirmed due to current cardiac rhythm.   Assessment/Plan Acute on chronic diastolic congestive heart failure (HCC) Has lost almost 10 lbs on  Torsemide Cardiology Follow up pending Continue Torsemide 20 mg QD  Addendum BUN and Creat today was 19/1.10 Potassium was 3.4 Increase Potassium to 20 BID Repeat BMP in 1 week Sodium is 138  Hyponatremia Sodium 138 Repeat BMP in 1 week [place] FR to 1500 cc as patient feeling weak and Dry Follow up with Nephrology  Atrial fibrillation, chronic (HCC) Coreg and Eliquis  H/OCarcinoid bronchial adenoma of right lung ([place]) Recent Imaging in 5/21 showed stable nodules Anemia Started on Low dose of Iron HGB  today is 10  Slow transit constipation Will Try Prune Juice today On PRN miralax  Atrophic vaginitis Estrogen   Family/ staff Communication:   Labs/tests ordered:  BMP in 1 week

## 2020-06-23 LAB — BASIC METABOLIC PANEL
BUN: 21 (ref 4–21)
CO2: 30 — AB (ref 13–22)
Chloride: 103 (ref 99–108)
Creatinine: 0.8 (ref 0.5–1.1)
Glucose: 101
Potassium: 4.3 (ref 3.4–5.3)
Sodium: 143 (ref 137–147)

## 2020-06-23 LAB — COMPREHENSIVE METABOLIC PANEL: Calcium: 9.2 (ref 8.7–10.7)

## 2020-06-24 ENCOUNTER — Encounter: Payer: Self-pay | Admitting: Internal Medicine

## 2020-06-24 ENCOUNTER — Non-Acute Institutional Stay (SKILLED_NURSING_FACILITY): Payer: Medicare Other | Admitting: Internal Medicine

## 2020-06-24 DIAGNOSIS — E871 Hypo-osmolality and hyponatremia: Secondary | ICD-10-CM

## 2020-06-24 DIAGNOSIS — I482 Chronic atrial fibrillation, unspecified: Secondary | ICD-10-CM

## 2020-06-24 DIAGNOSIS — K5901 Slow transit constipation: Secondary | ICD-10-CM

## 2020-06-24 DIAGNOSIS — I10 Essential (primary) hypertension: Secondary | ICD-10-CM

## 2020-06-24 DIAGNOSIS — I5033 Acute on chronic diastolic (congestive) heart failure: Secondary | ICD-10-CM | POA: Diagnosis not present

## 2020-06-24 NOTE — Progress Notes (Signed)
Location:   [facility] Room Number: [place] of Service:  SNF [phone]) Provider:  Veleta Miners [name]  Katie Redwood, [name]  Patient Care Team: Katie Redwood, [name] as PCP - General (Internal Medicine) O'Neal, Cassie Freer, [name] as PCP - Cardiology (Cardiology) Megan Salon, [name] (Gynecology) Eston Esters, [name] (Inactive) (Hematology and Oncology) Laurence Spates, [name] (Inactive) (Gastroenterology)  Extended Emergency Contact Information Primary Emergency Contact: [name],[name] Address: [address]          [place], [place] [address] [name] of [place] Phone: [phone] Relation: Son Secondary Emergency Contact: [name],[name] Address: [address]          [place], [place] [address] [name] of [place] Phone: [phone] Mobile Phone: [phone] Relation: Daughter  Code Status:  DNR Goals of care: Advanced Directive information Advanced Directives 06/14/2020  Does Patient Have a Medical Advance Directive? Yes  Type of Advance Directive Living will;Healthcare Power of Attorney  Does patient want to make changes to medical advance directive? No - Patient declined  Copy of [place] in Chart? Yes - validated most recent copy scanned in chart (See row information)  Would patient like information on creating a medical advance directive? -  Pre-existing out of facility DNR order (yellow form or pink MOST form) -     Chief Complaint  Patient presents with  . Acute Visit    Follow up    HPI:  Pt is a 86 y.o. female seen today for an acute visit for follow-up of her CHF and hyponatremia Patient was admitted in the hospital from 8/16-8/25 for hyponatremia and CHF  Patient has a history of chronic A. 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  Chest x-ray came back positive for CHF BNP elevated at 425 sodium at 134 Weight was up  Was started on Demadex 20 twice daily for 3 days and then 20 mg daily  Since then has done well. Lost Almost 15 lbs. Sodium up to 143 today. On FR of 1500 cc Bun and Creat Stable But patient continues mildly short of breath especially with therapy.  Her cough is mostly dry.  No chest pain no fever.  Still feels weak Continues to be on 2 L of oxygen.  Does not feel comfortable to go back to her apartment yet.  Past Medical History:  Diagnosis Date  . Arthritis 1996  . Atrial fibrillation (HCC)    on coumadin  . Atrial tachycardia ([place])   . Cancer [facility]) 1988   breast cancer, R mastectomy, chemo  . 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 Diverticulitis 1999  . Femur fracture ([place]) 03/2013   left, non weight bearing for 2 1/2 weeks  . GERD (gastroesophageal reflux disease)   . Hemorrhoids 1991  . HTN (hypertension)   . Lymphedema of upper extremity    right arm  . Osteoporosis   . PONV (postoperative nausea and vomiting)   . Urinary, incontinence, stress female   . Vitamin D deficiency disease    Past Surgical History:  Procedure Laterality Date  . APPENDECTOMY    . BREAST BIOPSY  05/04/1987   Right  Dr Annamaria Boots  . BREAST IMPLANT REMOVAL Right 06/05/10  . BUNIONECTOMY  1988  . carcinoid Right    Right lower lobectomy 2017  . COLONOSCOPY W/ POLYPECTOMY    . EYE SURGERY  5/14 ; 6/14   Cataract Surgery  . JOINT REPLACEMENT     2002 left; 2009 right Hip, rt knee 2012  . KNEE ARTHROPLASTY Right 2012   [facility]  . LOBECTOMY Right 01/05/2016   Procedure: RIGHT LOWER LOBE LOBECTOMY;  Surgeon: Melrose Nakayama, [name];  Location: [facility];  Service: Thoracic;  Laterality: Right;  . MASTECTOMY PARTIAL / LUMPECTOMY W/ AXILLARY LYMPHADENECTOMY   04/05/1987   Right - Dr Annamaria Boots  . RESECTION OF MEDIASTINAL MASS N/A 01/05/2016   Procedure: RESECTION OF PLEURAL MASS, right;  Surgeon: Melrose Nakayama, [name];  Location: [facility];  Service: Thoracic;  Laterality: N/A;  . TONSILLECTOMY    . TOTAL HIP ARTHROPLASTY    . TUBAL LIGATION Bilateral 1970  . VAGINAL HYSTERECTOMY  1977   myomata  . VIDEO ASSISTED THORACOSCOPY (VATS)/WEDGE RESECTION Right 01/05/2016   Procedure: RIGHT VIDEO ASSISTED THORACOSCOPY (VATS)/WEDGE RESECTION;  Surgeon: Melrose Nakayama, [name];  Location: [facility];  Service: Thoracic;  Laterality: Right;    Allergies  Allergen Reactions  . Aspirin Rash    Allergies as of 06/24/2020      Reactions   Aspirin Rash      Medication List       Accurate as of June 24, 2020 11:59 PM. If you have any questions, ask your nurse or doctor.        acetaminophen 500 MG tablet Commonly known as: TYLENOL Take 2 tablets (1,000 mg total) by mouth every 6 (six) hours as needed for mild pain.   bisacodyl 5 MG EC tablet Commonly known as: DULCOLAX Take 1 tablet (5 mg total) by mouth daily as needed for severe constipation.   Caltrate 600+D 600-400 MG-UNIT tablet Generic drug: Calcium Carbonate-Vitamin D Take 1 tablet by mouth daily.   carboxymethylcellulose 0.5 % Soln Commonly known as: REFRESH PLUS Place 1 drop into both eyes 2 (two) times daily as needed (for dry eyes).   carvedilol 6.25 MG tablet Commonly known as: COREG Take 1 tablet (6.25 mg total) by mouth 2 (two) times daily with a meal.   Eliquis 5 MG Tabs tablet Generic drug: apixaban Take 5 mg by mouth 2 (two) times daily.   Estradiol 10 MCG Tabs vaginal tablet Commonly known as: Vagifem Place 1 tablet (10 mcg total) vaginally 2 (two) times a week.   feeding supplement (ENSURE ENLIVE) Liqd Take 237 mLs by mouth 2 (two) times daily between meals.   ferrous sulfate 325 (65 FE) MG tablet Take 325 mg by mouth. Once A Day on Mon, Fri   Fish Oil 1000 MG  Caps Take 1 capsule by mouth 2 (two) times daily.   K-DUR PO Take 40 mEq by mouth daily.   multivitamin with minerals Tabs tablet Take 1 tablet by mouth every evening.   pantoprazole 40 MG tablet Commonly known as: PROTONIX Take 40 mg by mouth daily.   polyethylene glycol 17 g packet Commonly known as: MIRALAX / GLYCOLAX Take 17 g by mouth daily as needed for moderate constipation.   rosuvastatin 10 MG tablet Commonly known as: CRESTOR Take 10 mg by mouth at bedtime.   TORSEMIDE PO Take 20 mg by mouth. Take 20 mg BID for 3 days and then 20 mg QD   Vitamin D2 10 MCG (  400 UNIT) Tabs Take 400 Units by mouth daily.       Review of Systems  Constitutional: Positive for activity change and unexpected weight change.  Respiratory: Positive for cough and shortness of breath.   Cardiovascular: Negative.   Gastrointestinal: Negative.   Genitourinary: Negative.   Musculoskeletal: Negative.   Neurological: Positive for weakness.  Psychiatric/Behavioral: Negative.     Immunization History  Administered Date(s) Administered  . Influenza-Unspecified 06/30/2016  . Moderna SARS-COVID-2 Vaccination 10/19/2019, 11/16/2019  . Pneumococcal Polysaccharide-23 12/16/2001  . Pneumococcal-Unspecified 07/30/2008, 10/22/2014  . Td 12/16/2001  . Tdap 10/15/2009  . Tetanus 05/09/2010  . Zoster 07/02/2006   Pertinent  Health Maintenance Due  Topic Date Due  . PNA vac Low Risk Adult (2 of 2 - PCV13) 10/23/2015  . INFLUENZA VACCINE  05/15/2020  . DEXA SCAN  Completed   No flowsheet data found. Functional Status Survey:    Vitals:   06/24/20 1039  BP: 140/80  Pulse: 81  Resp: 20  Temp: (!) 97.3 F (36.3 C)  SpO2: 93%  Weight: 125 lb (56.7 kg)  Height: 4\' 11"  (1.499 m)   Body mass index is 25.25 kg/m. Physical Exam Vitals reviewed.  HENT:     Head: Normocephalic.     Nose: Nose normal.     Mouth/Throat:     Mouth: Mucous membranes are moist.     Pharynx: Oropharynx is  clear.  Eyes:     Pupils: Pupils are equal, round, and reactive to light.  Cardiovascular:     Rate and Rhythm: Normal rate and regular rhythm.     Pulses: Normal pulses.  Pulmonary:     Effort: Pulmonary effort is normal.     Comments: Has Expiratory Wheezing on Exam today Abdominal:     General: Abdomen is flat. Bowel sounds are normal.     Palpations: Abdomen is soft.  Musculoskeletal:     Cervical back: Neck supple.     Comments: Mild edema  Skin:    General: Skin is warm.  Neurological:     General: No focal deficit present.     Mental Status: She is alert and oriented to person, place, and time.  Psychiatric:        Mood and Affect: Mood normal.        Thought Content: Thought content normal.     Labs reviewed: Recent Labs    06/02/20 0117 06/02/20 0925 06/03/20 0132 06/03/20 1109 06/04/20 0237 06/04/20 0804 06/06/20 0755 06/06/20 1543 06/07/20 1020 06/07/20 1020 06/08/20 0719 06/08/20 0719 06/14/20 0000 06/17/20 0000 06/23/20 0000  NA 119*   < > 118*   < > 117*   < > 133*   < > 130*   < > 129*  --  134* 138 143  K 5.1   < > 4.5   < > 5.1   < > 4.7  --  5.0   < > 4.7   < > 3.7 3.4 4.3  CL 81*   < > 84*   < > 84*   < > 93*  --  91*   < > 87*   < > 93* 88* 103  CO2 27   < > 27   < > 18*   < > 30  --  29   < > 30   < > 31* 41* 30*  GLUCOSE 129*   < > 119*   < > 102*   < > 115*  --  102*  --  107*  --   --   --   --   BUN 13   < > 22   < > 40*   < > 36*  --  27*   < > 28*  --  13 19 21   CREATININE 0.94   < > 1.14*   < > 1.15*   < > 0.99  --  0.87   < > 1.06*  --  0.6 1.1 0.8  CALCIUM 8.4*   < > 8.2*   < > 8.8*   < > 9.3  --  8.9   < > 9.3   < > 8.3* 8.8 9.2  MG 1.5*  --  2.0  --  2.2  --   --   --   --   --   --   --   --   --   --   PHOS  --   --  3.5  --   --   --   --   --  4.0  --  4.2  --   --   --   --    < > = values in this interval not displayed.   Recent Labs    06/02/20 0117 06/02/20 0117 06/03/20 0132 06/03/20 0132 06/07/20 1020  06/08/20 0719 06/14/20 0000  AST 32  --  23  --   --   --  27  ALT 17  --  17  --   --   --  22  ALKPHOS 67  --  59  --   --   --  67  BILITOT 1.7*  --  0.9  --   --   --   --   PROT 5.9*  --  5.3*  --   --   --   --   ALBUMIN 2.9*   < > 2.5*   < > 2.6* 3.0* 3.2*   < > = values in this interval not displayed.   Recent Labs    05/30/20 1249 05/30/20 1249 06/03/20 0132 06/03/20 0132 06/04/20 1520 06/14/20 0000 06/17/20 0000  WBC 7.4   < > 8.2   < > 9.5 5.6 5.3  NEUTROABS 6.5  --   --   --   --  4,799 4,367  HGB 11.3*   < > 10.7*   < > 11.2* 10.0* 10.0*  HCT 34.3*   < > 32.6*   < > 33.6* 31* 31*  MCV 93.7  --  93.7  --  93.6  --   --   PLT 196   < > 189   < > 219 245 255   < > = values in this interval not displayed.   Lab Results  Component Value Date   TSH 1.293 06/02/2020   No results found for: HGBA1C Lab Results  Component Value Date   CHOL 201 (HH) 04/09/2007   HDL 65.9 04/09/2007   LDLDIRECT 107.8 04/09/2007   TRIG 50 04/09/2007   CHOLHDL 3.1 CALC 04/09/2007    Significant Diagnostic Results in last 30 days:  DG Knee 1-2 Views Left  Result Date: 06/02/2020 CLINICAL DATA:  Lower extremity swelling EXAM: LEFT KNEE - 1-2 VIEW COMPARISON:  None. FINDINGS: No acute fracture or dislocation. Severe osteoarthritis of the left knee most pronounced within the lateral and patellofemoral compartments. Moderate-sized knee joint effusion. Mineralized loose body posterior to the knee. Chondrocalcinosis. Mild soft tissue edema. IMPRESSION: 1. No acute fracture or dislocation.  2. Severe osteoarthritis of the left knee with moderate-sized knee joint effusion. Electronically Signed   By: Davina Poke D.O.   On: 06/02/2020 07:58   DG Chest Portable 1 View  Result Date: 05/30/2020 CLINICAL DATA:  Cough and shortness of breath for the past week. History of lung cancer. EXAM: PORTABLE CHEST 1 VIEW COMPARISON:  CT chest dated Mar 03, 2020. Chest x-ray dated July 17, 2016. FINDINGS:  Stable cardiomegaly. New bilateral perihilar interstitial hazy airspace opacities. Postsurgical changes in the right lung. Unchanged chronic small right pleural effusion. No pneumothorax. No acute osseous abnormality. IMPRESSION: 1. New bilateral perihilar interstitial and hazy airspace opacities concerning for pulmonary edema or atypical infection. 2. Unchanged chronic small right pleural effusion. Electronically Signed   By: Titus Dubin M.D.   On: 05/30/2020 13:06   ECHOCARDIOGRAM COMPLETE  Result Date: 06/02/2020    ECHOCARDIOGRAM REPORT   Patient Name:   NIURKA BENECKE Date of Exam: 06/02/2020 Medical Rec #:  [id number]     Height:       59.0 in Accession #:    [id number]    Weight:       137.1 lb Date of Birth:  02-Jun-1934     BSA:          1.571 m Patient Age:    86 years      BP:           103/58 mmHg Patient Gender: F             HR:           75 bpm. Exam Location:  Inpatient Procedure: 2D Echo Indications:    acute diastolic CHF 741.28  History:        Patient has prior history of Echocardiogram examinations, most                 recent 02/03/2020. CHF, Arrythmias:Atrial Fibrillation; Risk                 Factors:Hypertension.  Sonographer:    Johny Chess Referring Phys: [id number] [place]  1. Left ventricular ejection fraction, by estimation, is 60 to 65%. The left ventricle has normal function. The left ventricle has no regional wall motion abnormalities. There is mild left ventricular hypertrophy. Left ventricular diastolic parameters are indeterminate.  2. Right ventricular systolic function is normal. The right ventricular size is normal. There is normal pulmonary artery systolic pressure.  3. Left atrial size was moderately dilated.  4. Right atrial size was severely dilated.  5. The mitral valve is abnormal. Mild mitral valve regurgitation.  6. Tricuspid valve regurgitation is mild to moderate.  7. The aortic valve is abnormal. Aortic valve regurgitation is mild. Mild  aortic valve sclerosis is present, with no evidence of aortic valve stenosis. Comparison(s): The left ventricular function is unchanged. FINDINGS  Left Ventricle: Left ventricular ejection fraction, by estimation, is 60 to 65%. The left ventricle has normal function. The left ventricle has no regional wall motion abnormalities. The left ventricular internal cavity size was normal in size. There is  mild left ventricular hypertrophy. Left ventricular diastolic parameters are indeterminate. Right Ventricle: The right ventricular size is normal. Right vetricular wall thickness was not assessed. Right ventricular systolic function is normal. There is normal pulmonary artery systolic pressure. The tricuspid regurgitant velocity is 2.85 m/s, and with an assumed right atrial pressure of 3 mmHg, the estimated right ventricular systolic pressure is 09.4 mmHg. Left Atrium: Left atrial size was  moderately dilated. Right Atrium: Right atrial size was severely dilated. Pericardium: There is no evidence of pericardial effusion. Mitral Valve: The mitral valve is abnormal. There is mild thickening of the mitral valve leaflet(s). Mild mitral annular calcification. Mild mitral valve regurgitation. Tricuspid Valve: The tricuspid valve is normal in structure. Tricuspid valve regurgitation is mild to moderate. Aortic Valve: The aortic valve is abnormal. Aortic valve regurgitation is mild. Mild aortic valve sclerosis is present, with no evidence of aortic valve stenosis. Pulmonic Valve: The pulmonic valve was not well visualized. Pulmonic valve regurgitation is trivial. Aorta: The aortic root and ascending aorta are structurally normal, with no evidence of dilitation. IAS/Shunts: No atrial level shunt detected by color flow Doppler.  LEFT VENTRICLE PLAX 2D LVIDd:         4.00 cm LVIDs:         2.50 cm LV PW:         0.70 cm LV IVS:        0.80 cm LVOT diam:     1.80 cm LV SV:         41 LV SV Index:   26 LVOT Area:     2.54 cm  IVC IVC  diam: 1.70 cm LEFT ATRIUM             Index       RIGHT ATRIUM           Index LA diam:        4.00 cm 2.55 cm/m  RA Area:     17.40 cm LA Vol (A2C):   60.8 ml 38.70 ml/m RA Volume:   42.10 ml  26.80 ml/m LA Vol (A4C):   59.7 ml 38.00 ml/m LA Biplane Vol: 62.7 ml 39.91 ml/m  AORTIC VALVE LVOT Vmax:   90.70 cm/s LVOT Vmean:  58.600 cm/s LVOT VTI:    0.163 m  AORTA Ao Root diam: 2.60 cm Ao Asc diam:  3.00 cm TRICUSPID VALVE TR Peak grad:   32.5 mmHg TR Vmax:        285.00 cm/s  SHUNTS Systemic VTI:  0.16 m Systemic Diam: 1.80 cm Dorris Carnes [name] Electronically signed by Dorris Carnes [name] Signature Date/Time: 06/02/2020/4:21:19 PM    Final    DG HIP UNILAT WITH PELVIS 2-3 VIEWS LEFT  Result Date: 06/02/2020 CLINICAL DATA:  Lower extremity edema EXAM: DG HIP (WITH OR WITHOUT PELVIS) 2-3V LEFT COMPARISON:  04/18/2013 FINDINGS: Status post left total hip arthroplasty. Arthroplasty components are in their expected alignment without evidence of periprosthetic lucency or fracture. Partially visualized right total hip arthroplasty, grossly intact. Bones are diffusely demineralized. Partially visualized lumbar spondylosis. IMPRESSION: Status post left total hip arthroplasty without evidence of hardware complication. Electronically Signed   By: Davina Poke D.O.   On: 06/02/2020 07:57   [place] EKG SITE RITE  Result Date: 06/04/2020 If Site Rite image not attached, placement could not be confirmed due to current cardiac rhythm.   Assessment/Plan Acute on chronic diastolic congestive heart failure (HCC) Sodium 143 BUN and Creat stable at 21/0.82 Potassium 4.3 Still  SOB with some wheezing Albuterol Nebs BIID for 3 days Repeat Chest Xray. Has Appointment with Cardiology Continue Oxygen PRn  Hyponatremia FR 1500 Sodium 143 Atrial fibrillation, chronic (HCC) Coreg and Eliquis Doing well  Slow transit constipation Doing good now  H/OCarcinoid bronchial adenoma of right lung ([place]) Recent Imaging in 5/21  showed stable nodules Anemia Started on Low dose of Iron Atrophic vaginitis Estrogen Family/ staff Communication:  Labs/tests ordered:  BMP in 1 week

## 2020-06-26 ENCOUNTER — Encounter: Payer: Self-pay | Admitting: Internal Medicine

## 2020-06-26 NOTE — Progress Notes (Signed)
Cardiology Office Note:   Date:  06/27/2020  NAME:  [name]    MRN: [id number] DOB:  April 14, 1934   PCP:  Marton Redwood, MD  Cardiologist:  Evalina Field, MD   Referring MD: Marton Redwood, MD   Chief Complaint  Patient presents with  . Follow-up   History of Present Illness:   [name] is a 86 y.o. female with a hx of permanent Afib on AC, hyponatremia (SIADH?), HTN, HFpEF who presents for follow-up. She was admitted in August for acute HFpEF and found to have severe hyponatremia down to 115. CHF did not explain this level of hyponatremia. Nephrology consulted and have hypertonic saline and tolvaptan. Na has normalized up to 143. She presents for follow-up today.  She presents with her daughter.  They report that when she arrived in the skilled nursing facility nearly 2 weeks ago she was short of breath and had increased volume.  They did adjust her diuretic and she is now on torsemide 20 mg daily.  She has been doing well with this.  Her breathing has improved.  She is on 2 L nasal cannula oxygen.  They are trying to wean this off.  She is also been started on an inhaler.  She has no history of asthma.  She seems to be talking in complete sentences today without any major issues.  She is on fluid restriction due to her SIADH.  Seems to be doing well on this.  Most recent sodium within normal limits.  She is working with physical therapy 5 days/week.  They work her for 1 hour per session.  She seems to be doing well with this.  She has no chest pain, shortness of breath or palpitations with this.  Overall seems to be doing well.  Has plans to follow-up with nephrology later this week to reevaluate her hyponatremia.  She denies chest pain, shortness of breath or palpitations today.  Problem List 1. Permanent Afib -CHADSVASC=4 2. Carcinoid lung tumor  3. Cirrhosis  4. Hyponatremia -SIADH? 5. HTN 6. HFpEF  -EF 60-65% -SOB, BNP 173  Past Medical History: Past Medical History:    Diagnosis Date  . Arthritis 1996  . Atrial fibrillation (HCC)    on coumadin  . Atrial tachycardia ([place])   . Cancer [facility]) 1988   breast cancer, R mastectomy, chemo  . 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 Diverticulitis 1999  . Femur fracture ([place]) 03/2013   left, non weight bearing for 2 1/2 weeks  . GERD (gastroesophageal reflux disease)   . Hemorrhoids 1991  . HTN (hypertension)   . Lymphedema of upper extremity    right arm  . Osteoporosis   . PONV (postoperative nausea and vomiting)   . Urinary, incontinence, stress female   . Vitamin D deficiency disease     Past Surgical History: Past Surgical History:  Procedure Laterality Date  . APPENDECTOMY    . BREAST BIOPSY  05/04/1987   Right Dr Annamaria Boots  . BREAST IMPLANT REMOVAL Right 06/05/10  . BUNIONECTOMY  1988  . carcinoid Right    Right lower lobectomy 2017  . COLONOSCOPY W/ POLYPECTOMY    . EYE SURGERY  5/14 ; 6/14   Cataract Surgery  . JOINT REPLACEMENT     2002 left; 2009 right Hip, rt knee 2012  . KNEE ARTHROPLASTY Right 2012   [facility]  . LOBECTOMY Right 01/05/2016   Procedure: RIGHT LOWER  LOBE LOBECTOMY;  Surgeon: Melrose Nakayama, MD;  Location: [facility];  Service: Thoracic;  Laterality: Right;  . MASTECTOMY PARTIAL / LUMPECTOMY W/ AXILLARY LYMPHADENECTOMY  04/05/1987   Right - Dr Annamaria Boots  . RESECTION OF MEDIASTINAL MASS N/A 01/05/2016   Procedure: RESECTION OF PLEURAL MASS, right;  Surgeon: Melrose Nakayama, MD;  Location: [place];  Service: Thoracic;  Laterality: N/A;  . TONSILLECTOMY    . TOTAL HIP ARTHROPLASTY    . TUBAL LIGATION Bilateral 1970  . VAGINAL HYSTERECTOMY  1977   myomata  . VIDEO ASSISTED THORACOSCOPY (VATS)/WEDGE RESECTION Right 01/05/2016   Procedure: RIGHT VIDEO ASSISTED THORACOSCOPY (VATS)/WEDGE RESECTION;  Surgeon: Melrose Nakayama, MD;  Location: [facility];  Service: Thoracic;  Laterality: Right;    Current Medications: Current Meds   Medication Sig  . acetaminophen (TYLENOL) 500 MG tablet Take 2 tablets (1,000 mg total) by mouth every 6 (six) hours as needed for mild pain.  Marland Kitchen apixaban (ELIQUIS) 5 MG TABS tablet Take 5 mg by mouth 2 (two) times daily.  . bisacodyl (DULCOLAX) 5 MG EC tablet Take 1 tablet (5 mg total) by mouth daily as needed for severe constipation.  . Calcium Carbonate-Vitamin D (CALTRATE 600+D) 600-400 MG-UNIT per tablet Take 1 tablet by mouth daily.  . carboxymethylcellulose (REFRESH PLUS) 0.5 % SOLN Place 1 drop into both eyes 2 (two) times daily as needed (for dry eyes).  . carvedilol (COREG) 6.25 MG tablet Take 1 tablet (6.25 mg total) by mouth 2 (two) times daily with a meal.  . Ergocalciferol (VITAMIN D2) 400 units TABS Take 400 Units by mouth daily.  . Estradiol (VAGIFEM) 10 MCG TABS vaginal tablet Place 1 tablet (10 mcg total) vaginally 2 (two) times a week.  . feeding supplement, ENSURE ENLIVE, (ENSURE ENLIVE) LIQD Take 237 mLs by mouth 2 (two) times daily between meals.  . ferrous sulfate 325 (65 FE) MG tablet Take 325 mg by mouth. Once A Day on Mon, Fri  . Multiple Vitamin (MULTIVITAMIN WITH MINERALS) TABS Take 1 tablet by mouth every evening.  . Omega-3 Fatty Acids (FISH OIL) 1000 MG CAPS Take 1 capsule by mouth 2 (two) times daily.  . pantoprazole (PROTONIX) 40 MG tablet Take 40 mg by mouth daily.  . polyethylene glycol (MIRALAX / GLYCOLAX) 17 g packet Take 17 g by mouth daily as needed for moderate constipation.  . Potassium Chloride Crys ER (K-DUR PO) Take 40 mEq by mouth daily.   . rosuvastatin (CRESTOR) 10 MG tablet Take 10 mg by mouth at bedtime.  . torsemide (DEMADEX) 20 MG tablet Take 20 mg by mouth daily.     Allergies:    Aspirin   Social History: Social History   Socioeconomic History  . Marital status: Widowed    Spouse name: Not on file  . Number of children: Not on file  . Years of education: Not on file  . Highest education level: Not on file  Occupational History  .  Not on file  Tobacco Use  . Smoking status: Never Smoker  . Smokeless tobacco: Never Used  Vaping Use  . Vaping Use: Never used  Substance and Sexual Activity  . Alcohol use: No    Alcohol/week: 0.0 standard drinks  . Drug use: No  . Sexual activity: Not Currently    Partners: Male    Birth control/protection: Surgical    Comment: hysterectomy  Other Topics Concern  . Not on file  Social History Narrative  . Not on file  Social Determinants of Health   Financial Resource Strain:   . Difficulty of Paying Living Expenses: Not on file  Food Insecurity:   . Worried About Charity fundraiser in the Last Year: Not on file  . Ran Out of Food in the Last Year: Not on file  Transportation Needs:   . Lack of Transportation (Medical): Not on file  . Lack of Transportation (Non-Medical): Not on file  Physical Activity:   . Days of Exercise per Week: Not on file  . Minutes of Exercise per Session: Not on file  Stress:   . Feeling of Stress : Not on file  Social Connections:   . Frequency of Communication with Friends and Family: Not on file  . Frequency of Social Gatherings with Friends and Family: Not on file  . Attends Religious Services: Not on file  . Active Member of Clubs or Organizations: Not on file  . Attends Archivist Meetings: Not on file  . Marital Status: Not on file     Family History: The patient's family history includes Colon cancer in her father and mother; Heart disease in her father.  ROS:   All other ROS reviewed and negative. Pertinent positives noted in the HPI.     EKGs/Labs/Other Studies Reviewed:   The following studies were personally reviewed by me today:  EKG: An EKG was ordered in office today which I personally reviewed, demonstrates atrial fibrillation heart rate 75 bpm with nonspecific ST-T changes noted.  TTE 06/02/2020 1. Left ventricular ejection fraction, by estimation, is 60 to 65%. The  left ventricle has normal function.  The left ventricle has no regional  wall motion abnormalities. There is mild left ventricular hypertrophy.  Left ventricular diastolic parameters  are indeterminate.  2. Right ventricular systolic function is normal. The right ventricular  size is normal. There is normal pulmonary artery systolic pressure.  3. Left atrial size was moderately dilated.  4. Right atrial size was severely dilated.  5. The mitral valve is abnormal. Mild mitral valve regurgitation.  6. Tricuspid valve regurgitation is mild to moderate.  7. The aortic valve is abnormal. Aortic valve regurgitation is mild. Mild  aortic valve sclerosis is present, with no evidence of aortic valve  stenosis.   Recent Labs: 06/02/2020: TSH 1.293 06/04/2020: Magnesium 2.2 06/14/2020: ALT 22; B Natriuretic Peptide 425 06/17/2020: Hemoglobin 10.0; Platelets 255 06/23/2020: BUN 21; Creatinine 0.8; Potassium 4.3; Sodium 143   Recent Lipid Panel    Component Value Date/Time   CHOL 201 (HH) 04/09/2007 0915   TRIG 50 04/09/2007 0915   HDL 65.9 04/09/2007 0915   CHOLHDL 3.1 CALC 04/09/2007 0915   VLDL 10 04/09/2007 0915   LDLDIRECT 107.8 04/09/2007 0915    Physical Exam:   VS:  BP 122/78   Pulse 75   Ht 4\' 11"  (1.499 m)   Wt 125 lb (56.7 kg)   LMP 10/16/1975 (Approximate)   SpO2 94%   BMI 25.25 kg/m    Wt Readings from Last 3 Encounters:  06/27/20 125 lb (56.7 kg)  06/24/20 125 lb (56.7 kg)  06/17/20 133 lb 9.6 oz (60.6 kg)    General: Well nourished, well developed, in no acute distress Heart: Atraumatic, normal size  Eyes: PEERLA, EOMI  Neck: Supple, no JVD Endocrine: No thryomegaly Cardiac: Normal S1, S2; RRR; no murmurs, rubs, or gallops Lungs: Clear to auscultation bilaterally, no wheezing, rhonchi or rales  Abd: Soft, nontender, no hepatomegaly  Ext: No edema, pulses 2+  Musculoskeletal: No deformities, BUE and BLE strength normal and equal Skin: Warm and dry, no rashes   Neuro: Alert and oriented to person,  place, time, and situation, CNII-XII grossly intact, no focal deficits  Psych: Normal mood and affect   ASSESSMENT:   [name] is a 86 y.o. female who presents for the following: 1. Chronic diastolic heart failure ([place])   2. Permanent atrial fibrillation ([place])   3. Essential hypertension     PLAN:   1. Chronic diastolic heart failure (HCC) -Euvolemic on exam today.  Course has been complicated by SIADH.  She is on 1500 cc fluid restriction.  Appears euvolemic today.  Would continue torsemide 20 mg daily.  No change to what she is on at the skilled nursing facility. -I think some of her volume issues are related to hypoalbuminemia.  She is on a high-protein diet.  I think this will help. -Hyponatremia likely was related to a combination of diastolic heart failure, UTI, poor nutrition.  Things have improved considerably.  2. Permanent atrial fibrillation (HCC) -Rate controlled on Coreg.  Continue Eliquis 5 mg twice daily.  No issues.  3. Essential hypertension -Well-controlled today.  No change in medication.  Disposition: Return in about 9 weeks (around 08/29/2020).  Medication Adjustments/Labs and Tests Ordered: Current medicines are reviewed at length with the patient today.  Concerns regarding medicines are outlined above.  Orders Placed This Encounter  Procedures  . EKG 12-Lead   No orders of the defined types were placed in this encounter.   Patient Instructions  Medication Instructions:  The current medical regimen is effective;  continue present plan and medications.  *If you need a refill on your cardiac medications before your next appointment, please call your pharmacy*  Follow-Up: At [facility], you and your health needs are our priority.  As part of our continuing mission to provide you with exceptional heart care, we have created designated Provider Care Teams.  These Care Teams include your primary Cardiologist (physician) and Advanced Practice Providers  (APPs -  Physician Assistants and Nurse Practitioners) who all work together to provide you with the care you need, when you need it.  We recommend signing up for the patient portal called "MyChart".  Sign up information is provided on this After Visit Summary.  MyChart is used to connect with patients for Virtual Visits (Telemedicine).  Patients are able to view lab/test results, encounter notes, upcoming appointments, etc.  Non-urgent messages can be sent to your provider as well.   To learn more about what you can do with MyChart, go to [email or web address].    Your next appointment:   November 15th  The format for your next appointment:   In Person  Provider:   Eleonore Chiquito, MD       Time Spent with Patient: I have spent a total of 25 minutes with patient reviewing hospital notes, telemetry, EKGs, labs and examining the patient as well as establishing an assessment and plan that was discussed with the patient.  > 50% of time was spent in direct patient care.  Signed, Addison Naegeli. Audie Box, [place]  [address], [place], [place] [address] [phone]  06/27/2020 8:51 AM

## 2020-06-27 ENCOUNTER — Ambulatory Visit (INDEPENDENT_AMBULATORY_CARE_PROVIDER_SITE_OTHER): Payer: Medicare Other | Admitting: Cardiovascular Disease

## 2020-06-27 ENCOUNTER — Encounter: Payer: Self-pay | Admitting: Cardiovascular Disease

## 2020-06-27 ENCOUNTER — Other Ambulatory Visit: Payer: Self-pay

## 2020-06-27 VITALS — BP 122/78 | HR 75 | Ht 59.0 in | Wt 125.0 lb

## 2020-06-27 DIAGNOSIS — I4821 Permanent atrial fibrillation: Secondary | ICD-10-CM

## 2020-06-27 DIAGNOSIS — I10 Essential (primary) hypertension: Secondary | ICD-10-CM

## 2020-06-27 DIAGNOSIS — I5032 Chronic diastolic (congestive) heart failure: Secondary | ICD-10-CM | POA: Diagnosis not present

## 2020-06-27 NOTE — Patient Instructions (Signed)
Medication Instructions:  The current medical regimen is effective;  continue present plan and medications.  *If you need a refill on your cardiac medications before your next appointment, please call your pharmacy*  Follow-Up: At [facility], you and your health needs are our priority.  As part of our continuing mission to provide you with exceptional heart care, we have created designated Provider Care Teams.  These Care Teams include your primary Cardiologist (physician) and Advanced Practice Providers (APPs -  Physician Assistants and Nurse Practitioners) who all work together to provide you with the care you need, when you need it.  We recommend signing up for the patient portal called "MyChart".  Sign up information is provided on this After Visit Summary.  MyChart is used to connect with patients for Virtual Visits (Telemedicine).  Patients are able to view lab/test results, encounter notes, upcoming appointments, etc.  Non-urgent messages can be sent to your provider as well.   To learn more about what you can do with MyChart, go to [email or web address].    Your next appointment:   November 15th  The format for your next appointment:   In Person  Provider:   Eleonore Chiquito, MD

## 2020-07-14 ENCOUNTER — Encounter: Payer: Self-pay | Admitting: Nurse Practitioner

## 2020-07-14 ENCOUNTER — Non-Acute Institutional Stay: Payer: Medicare Other | Admitting: Nurse Practitioner

## 2020-07-14 DIAGNOSIS — K219 Gastro-esophageal reflux disease without esophagitis: Secondary | ICD-10-CM | POA: Diagnosis not present

## 2020-07-14 DIAGNOSIS — K5901 Slow transit constipation: Secondary | ICD-10-CM

## 2020-07-14 DIAGNOSIS — I10 Essential (primary) hypertension: Secondary | ICD-10-CM

## 2020-07-14 DIAGNOSIS — D509 Iron deficiency anemia, unspecified: Secondary | ICD-10-CM | POA: Insufficient documentation

## 2020-07-14 DIAGNOSIS — I5033 Acute on chronic diastolic (congestive) heart failure: Secondary | ICD-10-CM

## 2020-07-14 DIAGNOSIS — N952 Postmenopausal atrophic vaginitis: Secondary | ICD-10-CM

## 2020-07-14 DIAGNOSIS — I482 Chronic atrial fibrillation, unspecified: Secondary | ICD-10-CM | POA: Diagnosis not present

## 2020-07-14 DIAGNOSIS — D500 Iron deficiency anemia secondary to blood loss (chronic): Secondary | ICD-10-CM

## 2020-07-14 NOTE — Progress Notes (Signed)
Location:   [facility] Room Number: [place] of Service:  ALF [phone]) Provider: Marlana Latus NP  Marton Redwood, MD  Patient Care Team: Marton Redwood, MD as PCP - General (Internal Medicine) O'Neal, Cassie Freer, MD as PCP - Cardiology (Cardiology) Megan Salon, MD (Gynecology) Eston Esters, MD (Inactive) (Hematology and Oncology) Laurence Spates, MD (Inactive) (Gastroenterology)  Extended Emergency Contact Information Primary Emergency Contact: Varble,[name] Address: [address]          [place], [place] [address] [name] of [place] Phone: [phone] Relation: Son Secondary Emergency Contact: [name],[name] Address: [address]          [place], [place] [address] [name] of [place] Phone: [phone] Mobile Phone: [phone] Relation: Daughter  Code Status:  DNR Goals of care: Advanced Directive information Advanced Directives 06/14/2020  Does Patient Have a Medical Advance Directive? Yes  Type of Advance Directive Living will;Healthcare Power of Attorney  Does patient want to make changes to medical advance directive? No - Patient declined  Copy of [place] in Chart? Yes - validated most recent copy scanned in chart (See row information)  Would patient like information on creating a medical advance directive? -  Pre-existing out of facility DNR order (yellow form or pink MOST form) -     Chief Complaint  Patient presents with  . Acute Visit    Medication review    HPI:  Pt is a 86 y.o. female seen today for evaluation of her chronic medical issues, returning [facility]. The patient was admitted to [facility] then transitioned to [facility] following hospitalized 05/30/20-06/08/20 for acute on CHF, on Torsemide 63m qd. . F/u cardiology 06/27/20, on 1500cc fluid restriction. She has received therapy, regained her physical strength and ADL function to return [facility].              Hyponatremia, 129 upon dc, on  1200cc/day fluid restriction, f/u BMP 06/13/20, pending Nephrology, eGFR 49 06/08/20 HTN, takes Amlodipine, Coreg, Rosuvastatin Afib, takes Coreg, Eliquis CHF, chronic edema BLE, takes Torsemide, EF 60-65% Constipation, takes MiraLax, Bisacodyl.  Atrophic vaginitis, takes Estradiol vaginal tab GERD takes Pantoprazole  Anemia, stable, Hgb 10 06/17/20, on Fe      Past Medical History:  Diagnosis Date  . Arthritis 1996  . Atrial fibrillation (HCC)    on coumadin  . Atrial tachycardia ([facility]   . Cancer ([facility] 1988   breast cancer, R mastectomy, chemo  . Carcinoid bronchial adenoma of right lung ([facility] 01/05/2016   Right lower lobectomy  . Diverticulitis 1999  . [place]ur [place] ([facility] 03[place]   left, non weight bearing for 2 1/2 weeks  . GERD (gastroesophageal reflux disease)   . Hemorrhoids 1991  . HTN (hypertension)   . Lymphedema of upper extremity    right arm  . Osteoporosis   . PONV (postoperative nausea and vomiting)   . Urinary, incontinence, stress female   . Vitamin D deficiency disease    Past Surgical History:  Procedure Laterality Date  . APPENDECTOMY    . BREAST BIOPSY  05/04/1987   Right Dr YAnnamaria Boots . BREAST IMPLANT REMOVAL Right 06/05/10  . BUNIONECTOMY  1988  . carcinoid Right    Right lower lobectomy 2017  . COLONOSCOPY W/ POLYPECTOMY    . EYE SURGERY  5/14 ; 6/14   Cataract Surgery  . JOINT REPLACEMENT     2002 left; 2009 right Hip, rt knee 2012  . KNEE  ARTHROPLASTY Right 2012   [facility]  . LOBECTOMY Right 01/05/2016   Procedure: RIGHT LOWER LOBE LOBECTOMY;  Surgeon: Melrose Nakayama, MD;  Location: [facility];  Service: Thoracic;  Laterality: Right;  . MASTECTOMY PARTIAL / LUMPECTOMY W/ AXILLARY LYMPHADENECTOMY  04/05/1987   Right - Dr Annamaria Boots  . RESECTION OF MEDIASTINAL MASS N/A 01/05/2016   Procedure: RESECTION OF PLEURAL MASS, right;  Surgeon: Melrose Nakayama, MD;  Location: [facility];   Service: Thoracic;  Laterality: N/A;  . TONSILLECTOMY    . TOTAL HIP ARTHROPLASTY    . TUBAL LIGATION Bilateral 1970  . VAGINAL HYSTERECTOMY  1977   myomata  . VIDEO ASSISTED THORACOSCOPY (VATS)/WEDGE RESECTION Right 01/05/2016   Procedure: RIGHT VIDEO ASSISTED THORACOSCOPY (VATS)/WEDGE RESECTION;  Surgeon: Melrose Nakayama, MD;  Location: [facility];  Service: Thoracic;  Laterality: Right;    Allergies  Allergen Reactions  . Aspirin Rash    Allergies as of 07/14/2020      Reactions   Aspirin Rash      Medication List       Accurate as of July 14, 2020 11:59 PM. If you have any questions, ask your nurse or doctor.        STOP taking these medications   amLODipine 10 MG tablet Commonly known as: NORVASC Stopped by: Karnell Vanderloop X Tamyah Cutbirth, NP   furosemide 20 MG tablet Commonly known as: LASIX Stopped by: Laqueena Hinchey X Biana Haggar, NP     TAKE these medications   acetaminophen 500 MG tablet Commonly known as: TYLENOL Take 2 tablets (1,000 mg total) by mouth every 6 (six) hours as needed for mild pain.   albuterol 0.63 MG/3ML nebulizer solution Commonly known as: ACCUNEB Take 3 mLs (0.63 mg total) by nebulization 2 (two) times daily as needed for wheezing.   apixaban 5 MG Tabs tablet Commonly known as: Eliquis Take 1 tablet (5 mg total) by mouth 2 (two) times daily.   bisacodyl 5 MG EC tablet Commonly known as: DULCOLAX Take 1 tablet (5 mg total) by mouth daily as needed for severe constipation.   Caltrate 600+D 600-400 MG-UNIT tablet Generic drug: Calcium Carbonate-Vitamin D Take 1 tablet by mouth daily.   carboxymethylcellulose 0.5 % Soln Commonly known as: REFRESH PLUS Place 1 drop into both eyes 2 (two) times daily as needed (for dry eyes).   carvedilol 6.25 MG tablet Commonly known as: COREG Take 1 tablet (6.25 mg total) by mouth 2 (two) times daily with a meal.   Estradiol 10 MCG Tabs vaginal tablet Commonly known as: Vagifem Place 1 tablet (10 mcg total) vaginally 2  (two) times a week. Start taking on: July 18, 2020   ferrous sulfate 325 (65 FE) MG tablet Take 325 mg by mouth. Once A Day on Mon, Fri   Fish Oil 1000 MG Caps Take 1 capsule by mouth 2 (two) times daily.   lactose free nutrition Liqd Take 237 mLs by mouth 2 (two) times daily between meals. What changed: Another medication with the same name was removed. Continue taking this medication, and follow the directions you see here. Changed by: Etai Copado X Coretha Creswell, NP   multivitamin with minerals Tabs tablet Take 1 tablet by mouth every evening.   pantoprazole 40 MG tablet Commonly known as: PROTONIX Take 1 tablet (40 mg total) by mouth daily.   polyethylene glycol 17 g packet Commonly known as: MIRALAX / GLYCOLAX Take 17 g by mouth daily as needed for moderate constipation.   potassium chloride SA 20  MEQ tablet Commonly known as: KLOR-CON Take 2 tablets (40 mEq total) by mouth daily. What changed: Another medication with the same name was removed. Continue taking this medication, and follow the directions you see here. Changed by: Gusta Marksberry X Shamonique Battiste, NP   rosuvastatin 10 MG tablet Commonly known as: CRESTOR Take 1 tablet (10 mg total) by mouth at bedtime.   torsemide 20 MG tablet Commonly known as: DEMADEX Take 1 tablet (20 mg total) by mouth daily.   Vitamin D2 10 MCG (400 UNIT) Tabs Take 400 Units by mouth daily.       Review of Systems  Constitutional: Negative for appetite change, fatigue and fever.  HENT: Positive for hearing loss. Negative for congestion, trouble swallowing and voice change.   Eyes: Negative for visual disturbance.  Respiratory: Positive for cough. Negative for shortness of breath and wheezing.        Hx of right lower lobectomy 2017. Chronic cough remains the same. Uses O2 via [place] at night   Cardiovascular: Positive for leg swelling.  Gastrointestinal: Negative for abdominal pain and constipation.  Genitourinary: Positive for frequency. Negative for difficulty  urinating, dysuria and urgency.  Musculoskeletal: Positive for arthralgias and gait problem.  Skin: Negative for color change.  Neurological: Negative for speech difficulty, weakness and headaches.  Psychiatric/Behavioral: Negative for behavioral problems and sleep disturbance. The patient is not nervous/anxious.     Immunization History  Administered Date(s) Administered  . Influenza-Unspecified 06/30/2016  . Moderna SARS-COVID-2 Vaccination 10/19/2019, 11/16/2019  . Pneumococcal Polysaccharide-23 12/16/2001  . Pneumococcal-Unspecified 07/30/2008, 10/22/2014  . Td 12/16/2001  . Tdap 10/15/2009  . Tetanus 05/09/2010  . Zoster 07/02/2006   Pertinent  Health Maintenance Due  Topic Date Due  . PNA vac Low Risk Adult (2 of 2 - PCV13) 10/23/2015  . INFLUENZA VACCINE  05/15/2020  . DEXA SCAN  Completed   No flowsheet data found. Functional Status Survey:    Vitals:   07/14/20 1249  BP: 112/64  Pulse: 78  Resp: 20  Temp: (!) 97.3 F (36.3 C)  SpO2: 93%  Weight: 121 lb (54.9 kg)  Height: _0  (1.499 m)   Body mass index is 24.44 kg/m. Physical Exam Vitals and nursing note reviewed.  Constitutional:      Appearance: Normal appearance.  HENT:     Head: Normocephalic and atraumatic.     Mouth/Throat:     Mouth: Mucous membranes are moist.  Eyes:     Extraocular Movements: Extraocular movements intact.     Conjunctiva/sclera: Conjunctivae normal.     Pupils: Pupils are equal, round, and reactive to light.  Cardiovascular:     Rate and Rhythm: Normal rate. Rhythm irregular.     Heart sounds: No murmur heard.   Pulmonary:     Breath sounds: No wheezing or rales.     Comments: s/p right lung lobectomy 2017. Right lumpectomy 30 years ago Abdominal:     General: Bowel sounds are normal.     Palpations: Abdomen is soft.     Tenderness: There is no abdominal tenderness. There is no rebound.  Musculoskeletal:     Cervical back: Normal range of motion and neck supple.      Right lower leg: Edema present.     Left lower leg: Edema present.     Comments: Minimal edema BLE  Skin:    General: Skin is warm and dry.     Comments: Large bruise lateral left thigh, multiple ecchymoses left forearm  Neurological:  General: No focal deficit present.     Mental Status: She is alert and oriented to person, place, and time. Mental status is at baseline.     Motor: No weakness.     Coordination: Coordination normal.     Gait: Gait abnormal.  Psychiatric:        Mood and Affect: Mood normal.        Behavior: Behavior normal.        Thought Content: Thought content normal.        Judgment: Judgment normal.     Labs reviewed: Recent Labs    06/02/20 0117 06/02/20 0925 06/03/20 0132 06/03/20 1109 06/04/20 0237 06/04/20 0804 06/06/20 0755 06/06/20 1543 06/07/20 1020 06/07/20 1020 06/08/20 0719 06/08/20 0719 06/14/20 0000 06/17/20 0000 06/23/20 0000  NA 119*   < > 118*   < > 117*   < > 133*   < > 130*   < > 129*  --  134* 138 143  K 5.1   < > 4.5   < > 5.1   < > 4.7  --  5.0   < > 4.7   < > 3.7 3.4 4.3  CL 81*   < > 84*   < > 84*   < > 93*  --  91*   < > 87*   < > 93* 88* 103  CO2 27   < > 27   < > 18*   < > 30  --  29   < > 30   < > 31* 41* 30*  GLUCOSE 129*   < > 119*   < > 102*   < > 115*  --  102*  --  107*  --   --   --   --   BUN 13   < > 22   < > 40*   < > 36*  --  27*   < > 28*  --  _0 CREATININE 0.94   < > 1.14*   < > 1.15*   < > 0.99  --  0.87   < > 1.06*  --  0.6 1.1 0.8  CALCIUM 8.4*   < > 8.2*   < > 8.8*   < > 9.3  --  8.9   < > 9.3   < > 8.3* 8.8 9.2  MG 1.5*  --  2.0  --  2.2  --   --   --   --   --   --   --   --   --   --   PHOS  --   --  3.5  --   --   --   --   --  4.0  --  4.2  --   --   --   --    < > = values in this interval not displayed.   Recent Labs    06/02/20 0117 06/02/20 0117 06/03/20 0132 06/03/20 0132 06/07/20 1020 06/08/20 0719 06/14/20 0000  AST 32  --  23  --   --   --  27  ALT 17  --  17  --   --    --  22  ALKPHOS 67  --  59  --   --   --  67  BILITOT 1.7*  --  0.9  --   --   --   --   PROT 5.9*  --  5.3*  --   --   --   --  ALBUMIN 2.9*   < > 2.5*   < > 2.6* 3.0* 3.2*   < > = values in this interval not displayed.   Recent Labs    05/30/20 1249 05/30/20 1249 06/03/20 0132 06/03/20 0132 06/04/20 1520 06/14/20 0000 06/17/20 0000  WBC 7.4   < > 8.2   < > 9.5 5.6 5.3  NEUTROABS 6.5  --   --   --   --  4,799 4,367  HGB 11.3*   < > 10.7*   < > 11.2* 10.0* 10.0*  HCT 34.3*   < > 32.6*   < > 33.6* 31* 31*  MCV 93.7  --  93.7  --  93.6  --   --   PLT 196   < > 189   < > 219 245 255   < > = values in this interval not displayed.   Lab Results  Component Value Date   TSH 1.293 06/02/2020   No results found for: HGBA1C Lab Results  Component Value Date   CHOL 201 (HH) 04/09/2007   HDL 65.9 04/09/2007   LDLDIRECT 107.8 04/09/2007   TRIG 50 04/09/2007   CHOLHDL 3.1 CALC 04/09/2007    Significant Diagnostic Results in last 30 days:  No results found.  Assessment/Plan  Acute exacerbation of congestive heart failure (HCC) Compensated, euvolemic, continue Torsemide.   Atrial fibrillation, chronic (HCC) Heart rate is in control, continue Coreg, Eliquis.   Essential hypertension Blood pressure is controlled, continue Amlodipine, Coreg.   GERD (gastroesophageal reflux disease) Stable, continue Pantoprazole.   Atrophic vaginitis Stable, continue Estradiol.   Slow transit constipation Stable, continue prn MiraLax, Bisacodyl.   IDA (iron deficiency anemia) Stable, continue Fe, Hgb 10 06/17/20   Family/ staff Communication: plan of care reviewed with the patient and charge nurse.   Labs/tests ordered: none

## 2020-07-14 NOTE — Assessment & Plan Note (Signed)
Stable, continue Fe, Hgb 10 06/17/20

## 2020-07-14 NOTE — Assessment & Plan Note (Signed)
Compensated, euvolemic, continue Torsemide.

## 2020-07-14 NOTE — Assessment & Plan Note (Signed)
Blood pressure is controlled, continue Amlodipine, Coreg.

## 2020-07-14 NOTE — Assessment & Plan Note (Signed)
Stable, continue Pantoprazole.  

## 2020-07-14 NOTE — Assessment & Plan Note (Signed)
Stable, continue prn MiraLax, Bisacodyl.

## 2020-07-14 NOTE — Assessment & Plan Note (Signed)
Heart rate is in control, continue Coreg, Eliquis.

## 2020-07-14 NOTE — Assessment & Plan Note (Signed)
Stable, continue Estradiol.  

## 2020-07-15 ENCOUNTER — Encounter: Payer: Self-pay | Admitting: Nurse Practitioner

## 2020-07-15 MED ORDER — POTASSIUM CHLORIDE CRYS ER 20 MEQ PO TBCR: 40.0000 meq | EXTENDED_RELEASE_TABLET | Freq: Every day | ORAL | 0 refills | Status: DC

## 2020-07-15 MED ORDER — TORSEMIDE 20 MG PO TABS: 20.0000 mg | ORAL_TABLET | Freq: Every day | ORAL | 0 refills | Status: DC

## 2020-07-15 MED ORDER — ALBUTEROL SULFATE 0.63 MG/3ML IN NEBU: 1.0000 | INHALATION_SOLUTION | Freq: Two times a day (BID) | RESPIRATORY_TRACT | 0 refills | Status: DC | PRN

## 2020-07-15 MED ORDER — CARVEDILOL 6.25 MG PO TABS: 6.2500 mg | ORAL_TABLET | Freq: Two times a day (BID) | ORAL | 0 refills | Status: DC

## 2020-07-15 MED ORDER — ESTRADIOL 10 MCG VA TABS: 1.0000 | ORAL_TABLET | VAGINAL | 3 refills | Status: DC

## 2020-07-15 MED ORDER — VITAMIN D2 10 MCG (400 UNIT) PO TABS: 400.0000 [IU] | ORAL_TABLET | Freq: Every day | ORAL | 0 refills | Status: DC

## 2020-07-15 MED ORDER — PANTOPRAZOLE SODIUM 40 MG PO TBEC: 40.0000 mg | DELAYED_RELEASE_TABLET | Freq: Every day | ORAL | 0 refills | Status: DC

## 2020-07-15 MED ORDER — ROSUVASTATIN CALCIUM 10 MG PO TABS: 10.0000 mg | ORAL_TABLET | Freq: Every day | ORAL | 0 refills | Status: DC

## 2020-07-15 MED ORDER — POLYETHYLENE GLYCOL 3350 17 G PO PACK: 17.0000 g | PACK | Freq: Every day | ORAL | 2 refills | Status: AC | PRN

## 2020-07-15 MED ORDER — APIXABAN 5 MG PO TABS: 5.0000 mg | ORAL_TABLET | Freq: Two times a day (BID) | ORAL | 0 refills | Status: DC

## 2020-07-25 ENCOUNTER — Ambulatory Visit: Payer: Medicare Other | Admitting: Certified Nurse Midwife

## 2020-08-04 ENCOUNTER — Encounter: Payer: Self-pay | Admitting: Nurse Practitioner

## 2020-08-04 ENCOUNTER — Non-Acute Institutional Stay: Payer: Medicare Other | Admitting: Nurse Practitioner

## 2020-08-04 ENCOUNTER — Other Ambulatory Visit: Payer: Self-pay

## 2020-08-04 DIAGNOSIS — K5901 Slow transit constipation: Secondary | ICD-10-CM

## 2020-08-04 DIAGNOSIS — N952 Postmenopausal atrophic vaginitis: Secondary | ICD-10-CM

## 2020-08-04 DIAGNOSIS — I482 Chronic atrial fibrillation, unspecified: Secondary | ICD-10-CM | POA: Diagnosis not present

## 2020-08-04 DIAGNOSIS — R040 Epistaxis: Secondary | ICD-10-CM | POA: Insufficient documentation

## 2020-08-04 DIAGNOSIS — E871 Hypo-osmolality and hyponatremia: Secondary | ICD-10-CM | POA: Diagnosis not present

## 2020-08-04 DIAGNOSIS — K219 Gastro-esophageal reflux disease without esophagitis: Secondary | ICD-10-CM

## 2020-08-04 DIAGNOSIS — D500 Iron deficiency anemia secondary to blood loss (chronic): Secondary | ICD-10-CM

## 2020-08-04 DIAGNOSIS — I10 Essential (primary) hypertension: Secondary | ICD-10-CM

## 2020-08-04 DIAGNOSIS — I5033 Acute on chronic diastolic (congestive) heart failure: Secondary | ICD-10-CM | POA: Diagnosis not present

## 2020-08-04 NOTE — Assessment & Plan Note (Signed)
Afib, takes Coreg, Eliquis

## 2020-08-04 NOTE — Assessment & Plan Note (Addendum)
Right nostril, O2 at night, chronic Eliquis use are contributory. Saline nasal gel qhs. Update CBC/diff. Suggested humidifier for O2 concentrator.

## 2020-08-04 NOTE — Assessment & Plan Note (Signed)
Constipation,takesMiraLax, Bisacodyl.

## 2020-08-04 NOTE — Assessment & Plan Note (Signed)
Atrophic vaginitis, takes Estradiol vaginal tab

## 2020-08-04 NOTE — Assessment & Plan Note (Signed)
GERD takes Pantoprazole

## 2020-08-04 NOTE — Progress Notes (Signed)
Location:   clinic [place]   Place of Service:  Clinic (12) Provider: Marlana Latus NP  Code Status: DNR Goals of Care: [place] 06/14/2020  Does Patient Have a Medical Advance Directive? Yes  Type of Advance Directive Living will;Healthcare Power of Attorney  Does patient want to make changes to medical advance directive? No - Patient declined  Copy of [place] in Chart? Yes - validated most recent copy scanned in chart (See row information)  Would patient like information on creating a medical advance directive? -  Pre-existing out of facility DNR order (yellow form or pink MOST form) -     Chief Complaint  Patient presents with  . Medical Management of Chronic Issues    Patient returns to the clinic for follow up.     HPI: Patient is a 86 y.o. female seen today for medical management of chronic diseases.   Hyponatremia, 129 upon dc, on 1200cc/day fluid restriction, f/u BMP 06/13/20, pending Nephrology, eGFR 49 06/08/20 HTN, takes  Coreg, Rosuvastatin Afib, takes Coreg, Eliquis CHF, chronic edema BLE, takes Torsemide, EF 60-65%, hospitalized 05/30/20-06/08/20 for acute on CHF, on Torsemide 80m qd. . F/u cardiology 06/27/20, on 1200cc fluid restriction. Constipation,takesMiraLax, Bisacodyl.  Atrophic vaginitis, takes Estradiol vaginal tab GERD takes Pantoprazole             Anemia, stable, Hgb 10 06/17/20, on Fe   Past Medical History:  Diagnosis Date  . Arthritis 1996  . Atrial fibrillation (HCC)    on coumadin  . [place]   . [facility] ([facility]   breast cancer, R mastectomy, chemo  . Carcinoid bronchial adenoma of right lung ([facility] 01/05/2016   Right lower lobectomy  . Diverticulitis 1999  . Femur fracture ([facility] 03/2013   left, non weight bearing for 2 1/2 weeks  . GERD (gastroesophageal reflux disease)   . Hemorrhoids 1991  . HTN  (hypertension)   . Lymphedema of upper extremity    right arm  . Osteoporosis   . PONV (postoperative nausea and vomiting)   . Urinary, incontinence, stress female   . Vitamin D deficiency disease     Past Surgical History:  Procedure Laterality Date  . APPENDECTOMY    . BREAST BIOPSY  05/04/1987   Right Dr YAnnamaria Boots . BREAST IMPLANT REMOVAL Right 06/05/10  . BUNIONECTOMY  1988  . carcinoid Right    Right lower lobectomy 2017  . COLONOSCOPY W/ POLYPECTOMY    . EYE SURGERY  5/14 ; 6/14   Cataract Surgery  . JOINT REPLACEMENT     2002 left; 2009 right Hip, rt knee 2012  . KNEE ARTHROPLASTY Right 2012   [facility] . LOBECTOMY Right 01/05/2016   Procedure: RIGHT LOWER LOBE LOBECTOMY;  Surgeon: SMelrose Nakayama MD;  [place]  Service: Thoracic;  Laterality: Right;  . MASTECTOMY PARTIAL / LUMPECTOMY W/ AXILLARY LYMPHADENECTOMY  04/05/1987   Right - Dr YAnnamaria Boots . RESECTION OF MEDIASTINAL MASS N/A 01/05/2016   Procedure: RESECTION OF PLEURAL MASS, right;  Surgeon: SMelrose Nakayama MD;  [place] [facility]  Service: Thoracic;  Laterality: N/A;  . TONSILLECTOMY    . TOTAL HIP ARTHROPLASTY    . TUBAL LIGATION Bilateral 1970  . VAGINAL HYSTERECTOMY  1977   myomata  . VIDEO ASSISTED THORACOSCOPY (VATS)/WEDGE RESECTION Right 01/05/2016   Procedure: RIGHT VIDEO ASSISTED THORACOSCOPY (VATS)/WEDGE RESECTION;  Surgeon: SMelrose Nakayama MD;  [place] [facility]  Service: Thoracic;  Laterality: Right;    Allergies  Allergen Reactions  . Aspirin Rash    Allergies as of 08/04/2020      Reactions   Aspirin Rash      Medication List       Accurate as of August 04, 2020 11:59 PM. If you have any questions, ask your nurse or doctor.        STOP taking these medications   albuterol 0.63 MG/3ML nebulizer solution Commonly known as: ACCUNEB Stopped by: Blaise Grieshaber X Mung Rinker, NP     TAKE these medications   acetaminophen 500 MG tablet Commonly known as: TYLENOL Take 2 tablets (1,000  mg total) by mouth every 6 (six) hours as needed for mild pain.   apixaban 5 MG Tabs tablet Commonly known as: Eliquis Take 1 tablet (5 mg total) by mouth 2 (two) times daily.   bisacodyl 5 MG EC tablet Commonly known as: DULCOLAX Take 1 tablet (5 mg total) by mouth daily as needed for severe constipation.   Caltrate 600+D 600-400 MG-UNIT tablet Generic drug: Calcium Carbonate-Vitamin D Take 1 tablet by mouth daily.   carboxymethylcellulose 0.5 % Soln Commonly known as: REFRESH PLUS Place 1 drop into both eyes 2 (two) times daily as needed (for dry eyes).   carvedilol 6.25 MG tablet Commonly known as: COREG Take 1 tablet (6.25 mg total) by mouth 2 (two) times daily with a meal.   Estradiol 10 MCG Tabs vaginal tablet Commonly known as: Vagifem Place 1 tablet (10 mcg total) vaginally 2 (two) times a week.   ferrous sulfate 325 (65 FE) MG tablet Take 325 mg by mouth. Once A Day on Mon, Fri   Fish Oil 1000 MG Caps Take 1 capsule by mouth 2 (two) times daily.   lactose free nutrition Liqd Take 237 mLs by mouth 2 (two) times daily between meals.   multivitamin with minerals Tabs tablet Take 1 tablet by mouth every evening.   pantoprazole 40 MG tablet Commonly known as: PROTONIX Take 1 tablet (40 mg total) by mouth daily.   polyethylene glycol 17 g packet Commonly known as: MIRALAX / GLYCOLAX Take 17 g by mouth daily as needed for moderate constipation.   potassium chloride SA 20 MEQ tablet Commonly known as: KLOR-CON Take 2 tablets (40 mEq total) by mouth daily.   rosuvastatin 10 MG tablet Commonly known as: CRESTOR Take 1 tablet (10 mg total) by mouth at bedtime.   torsemide 20 MG tablet Commonly known as: DEMADEX Take 1 tablet (20 mg total) by mouth daily.   Vitamin D2 10 MCG (400 UNIT) Tabs Take 400 Units by mouth daily.       Review of Systems:  Review of Systems  Constitutional: Negative for appetite change, fatigue and fever.  HENT: Positive for  hearing loss. Negative for congestion, trouble swallowing and voice change.   Eyes: Negative for visual disturbance.  Respiratory: Positive for cough. Negative for shortness of breath and wheezing.        Hx of right lower lobectomy 2017. Chronic cough remains the same. Uses O2 via [place] at night   Cardiovascular: Positive for leg swelling.  Gastrointestinal: Negative for abdominal pain and constipation.  Genitourinary: Positive for frequency. Negative for difficulty urinating, dysuria and urgency.  Musculoskeletal: Positive for arthralgias and gait problem.  Skin: Negative for color change.  Neurological: Negative for speech difficulty, weakness and headaches.  Psychiatric/Behavioral: Negative for behavioral problems and sleep disturbance. The patient is not nervous/anxious.     Health Maintenance  Topic Date Due  . PNA vac Low Risk Adult (2 of 2 - PCV13) 10/23/2015  . TETANUS/TDAP  05/09/2020  . INFLUENZA VACCINE  05/15/2020  . DEXA SCAN  Completed  . COVID-19 Vaccine  Completed    Physical Exam: Vitals:   08/04/20 1512  BP: 126/74  Pulse: 79  Temp: (!) 97.5 F (36.4 C)  SpO2: 97%  Weight: 126 lb 3.2 oz (57.2 kg)  Height: _0  (1.499 m)   Body mass index is 25.49 kg/m. Physical Exam Vitals and nursing note reviewed.  Constitutional:      Appearance: Normal appearance.  HENT:     Head: Normocephalic and atraumatic.     Mouth/Throat:     Mouth: Mucous membranes are moist.  Eyes:     Extraocular Movements: Extraocular movements intact.     Conjunctiva/sclera: Conjunctivae normal.     Pupils: Pupils are equal, round, and reactive to light.  Cardiovascular:     Rate and Rhythm: Normal rate. Rhythm irregular.     Heart sounds: No murmur heard.   Pulmonary:     Breath sounds: No wheezing or rales.     Comments: s/p right lung lobectomy 2017. Right lumpectomy 30 years ago Abdominal:     General: Bowel sounds are normal.     Palpations: Abdomen is soft.      Tenderness: There is no abdominal tenderness. There is no rebound.  Musculoskeletal:     Cervical back: Normal range of motion and neck supple.     Right lower leg: Edema present.     Left lower leg: Edema present.     Comments: Minimal edema BLE  Skin:    General: Skin is warm and dry.     Comments: Large bruise lateral left thigh, multiple ecchymoses left forearm  Neurological:     General: No focal deficit present.     Mental Status: She is alert and oriented to person, place, and time. Mental status is at baseline.     Motor: No weakness.     Coordination: Coordination normal.     Gait: Gait abnormal.  Psychiatric:        Mood and Affect: Mood normal.        Behavior: Behavior normal.        Thought Content: Thought content normal.        Judgment: Judgment normal.     Labs reviewed: Basic Metabolic Panel: Recent Labs    06/02/20 0117 06/02/20 0117 06/02/20 0925 06/02/20 1430 06/03/20 0132 06/03/20 1109 06/04/20 0237 06/04/20 0804 06/06/20 0755 06/06/20 1543 06/07/20 1020 06/07/20 1020 06/08/20 0719 06/08/20 0719 06/14/20 0000 06/17/20 0000 06/23/20 0000  NA 119*   < > 119*   < > 118*   < > 117*   < > 133*   < > 130*   < > 129*  --  134* 138 143  K 5.1   < > 4.8  --  4.5   < > 5.1   < > 4.7  --  5.0   < > 4.7   < > 3.7 3.4 4.3  CL 81*   < > 85*  --  84*   < > 84*   < > 93*  --  91*   < > 87*   < > 93* 88* 103  CO2 27   < > 25  --  27   < > 18*   < > 30  --  29   < >  30   < > 31* 41* 30*  GLUCOSE 129*   < > 124*  --  119*   < > 102*   < > 115*  --  102*  --  107*  --   --   --   --   BUN 13   < > 15  --  22   < > 40*   < > 36*  --  27*   < > 28*  --  _0 CREATININE 0.94   < > 0.99  --  1.14*   < > 1.15*   < > 0.99  --  0.87   < > 1.06*  --  0.6 1.1 0.8  CALCIUM 8.4*   < > 8.2*  --  8.2*   < > 8.8*   < > 9.3  --  8.9   < > 9.3   < > 8.3* 8.8 9.2  MG 1.5*  --   --   --  2.0  --  2.2  --   --   --   --   --   --   --   --   --   --   PHOS  --   --   --   --   3.5  --   --   --   --   --  4.0  --  4.2  --   --   --   --   TSH  --   --  1.293  --   --   --   --   --   --   --   --   --   --   --   --   --   --    < > = values in this interval not displayed.   Liver Function Tests: Recent Labs    06/02/20 0117 06/02/20 0117 06/03/20 0132 06/03/20 0132 06/07/20 1020 06/08/20 0719 06/14/20 0000  AST 32  --  23  --   --   --  27  ALT 17  --  17  --   --   --  22  ALKPHOS 67  --  59  --   --   --  67  BILITOT 1.7*  --  0.9  --   --   --   --   PROT 5.9*  --  5.3*  --   --   --   --   ALBUMIN 2.9*   < > 2.5*   < > 2.6* 3.0* 3.2*   < > = values in this interval not displayed.   Recent Labs    06/02/20 0117  LIPASE 52*   No results for input(s): AMMONIA in the last 8760 hours. CBC: Recent Labs    05/30/20 1249 05/30/20 1249 06/03/20 0132 06/03/20 0132 06/04/20 1520 06/14/20 0000 06/17/20 0000  WBC 7.4   < > 8.2   < > 9.5 5.6 5.3  NEUTROABS 6.5  --   --   --   --  4,799 4,367  HGB 11.3*   < > 10.7*   < > 11.2* 10.0* 10.0*  HCT 34.3*   < > 32.6*   < > 33.6* 31* 31*  MCV 93.7  --  93.7  --  93.6  --   --   PLT 196   < > 189   < > 219 245 255   < > =  values in this interval not displayed.   Lipid Panel: No results for input(s): CHOL, HDL, LDLCALC, TRIG, CHOLHDL, LDLDIRECT in the last 8760 hours. No results found for: HGBA1C  Procedures since last visit: No results found.  Assessment/Plan  Hyponatremia Hyponatremia, 129 upon dc, on 1200cc/day fluid restriction, f/u BMP 06/13/20, pending Nephrology, eGFR 49 06/08/20 Update CMP/eGFR  Essential hypertension HTN, takes Coreg, Rosuvastatin   Atrial fibrillation, chronic (HCC) Afib, takes Coreg, Eliquis   Acute exacerbation of congestive heart failure (HCC) CHF, chronic edema BLE, takes Torsemide, EF 60-65%, hospitalized 05/30/20-06/08/20 for acute on CHF, on Torsemide 46m qd. . F/u cardiology 06/27/20, on 1200cc fluid restriction.   Slow transit  constipation Constipation,takesMiraLax, Bisacodyl.    Atrophic vaginitis Atrophic vaginitis, takes Estradiol vaginal tab   GERD (gastroesophageal reflux disease) GERD takes Pantoprazole  IDA (iron deficiency anemia) Anemia, stable, Hgb 10 06/17/20, on Fe. Update CBC/diff   Epistaxis, recurrent Right nostril, O2 at night, chronic Eliquis use are contributory. Saline nasal gel qhs. Update CBC/diff. Suggested humidifier for O2 concentrator.    Labs/tests ordered:  CBC/diff, CMP/eGFR  Next appt:  4-6 weels with Dr. GLyndel Safe

## 2020-08-04 NOTE — Assessment & Plan Note (Addendum)
HTN, takes Coreg, Rosuvastatin

## 2020-08-04 NOTE — Assessment & Plan Note (Signed)
CHF, chronic edema BLE, takes Torsemide, EF 60-65%, hospitalized 05/30/20-06/08/20 for acute on CHF, on Torsemide 20mg  qd. . F/u cardiology 06/27/20, on 1200cc fluid restriction.

## 2020-08-04 NOTE — Assessment & Plan Note (Signed)
Hyponatremia, 129 upon dc, on 1200cc/day fluid restriction, f/u BMP 06/13/20, pending Nephrology, eGFR 49 06/08/20 Update CMP/eGFR

## 2020-08-04 NOTE — Assessment & Plan Note (Signed)
Anemia, stable, Hgb 10 06/17/20, on Fe. Update CBC/diff

## 2020-08-05 ENCOUNTER — Encounter: Payer: Self-pay | Admitting: Nurse Practitioner

## 2020-08-09 ENCOUNTER — Other Ambulatory Visit: Payer: Self-pay

## 2020-08-09 DIAGNOSIS — D500 Iron deficiency anemia secondary to blood loss (chronic): Secondary | ICD-10-CM

## 2020-08-09 DIAGNOSIS — E871 Hypo-osmolality and hyponatremia: Secondary | ICD-10-CM

## 2020-08-10 LAB — COMPLETE METABOLIC PANEL WITH GFR
AG Ratio: 1.5 (calc) (ref 1.0–2.5)
ALT: 10 U/L (ref 6–29)
AST: 22 U/L (ref 10–35)
Albumin: 4 g/dL (ref 3.6–5.1)
Alkaline phosphatase (APISO): 56 U/L (ref 37–153)
BUN/Creatinine Ratio: 25 (calc) — ABNORMAL HIGH (ref 6–22)
BUN: 28 mg/dL — ABNORMAL HIGH (ref 7–25)
CO2: 28 mmol/L (ref 20–32)
Calcium: 9.7 mg/dL (ref 8.6–10.4)
Chloride: 100 mmol/L (ref 98–110)
Creat: 1.13 mg/dL — ABNORMAL HIGH (ref 0.60–0.88)
GFR, Est African American: 52 mL/min/{1.73_m2} — ABNORMAL LOW (ref >=60)
GFR, Est Non African American: 45 mL/min/{1.73_m2} — ABNORMAL LOW (ref >=60)
Globulin: 2.7 g/dL (calc) (ref 1.9–3.7)
Glucose, Bld: 94 mg/dL (ref 65–99)
Potassium: 4.6 mmol/L (ref 3.5–5.3)
Sodium: 139 mmol/L (ref 135–146)
Total Bilirubin: 1.2 mg/dL (ref 0.2–1.2)
Total Protein: 6.7 g/dL (ref 6.1–8.1)

## 2020-08-10 LAB — CBC WITH DIFFERENTIAL/PLATELET
Absolute Monocytes: 278 cells/uL (ref 200–950)
Basophils Absolute: 20 cells/uL (ref 0–200)
Basophils Relative: 0.7 %
Eosinophils Absolute: 20 cells/uL (ref 15–500)
Eosinophils Relative: 0.7 %
HCT: 36.9 % (ref 35.0–45.0)
Hemoglobin: 11.9 g/dL (ref 11.7–15.5)
Lymphs Abs: 647 cells/uL — ABNORMAL LOW (ref 850–3900)
MCH: 32.4 pg (ref 27.0–33.0)
MCHC: 32.2 g/dL (ref 32.0–36.0)
MCV: 100.5 fL — ABNORMAL HIGH (ref 80.0–100.0)
MPV: 10.3 fL (ref 7.5–12.5)
Monocytes Relative: 9.6 %
Neutro Abs: 1934 cells/uL (ref 1500–7800)
Neutrophils Relative %: 66.7 %
Platelets: 169 10*3/uL (ref 140–400)
RBC: 3.67 10*6/uL — ABNORMAL LOW (ref 3.80–5.10)
RDW: 13.7 % (ref 11.0–15.0)
Total Lymphocyte: 22.3 %
WBC: 2.9 10*3/uL — ABNORMAL LOW (ref 3.8–10.8)

## 2020-08-11 ENCOUNTER — Ambulatory Visit: Payer: Medicare Other | Admitting: Obstetrics and Gynecology

## 2020-08-26 ENCOUNTER — Other Ambulatory Visit: Payer: Self-pay | Admitting: Nurse Practitioner

## 2020-08-26 NOTE — Telephone Encounter (Signed)
Pharmacy requested refill Pended Rx and sent to Dr. Lyndel Safe for approval.

## 2020-08-28 NOTE — Progress Notes (Signed)
Cardiology Office Note:   Date:  08/29/2020  NAME:  [name]    MRN: [id number] DOB:  1934/01/22   PCP:  Virgie Dad, MD  Cardiologist:  Evalina Field, MD   Referring MD: Marton Redwood, MD   Chief Complaint  Patient presents with  . Follow-up   History of Present Illness:   [name] is a 86 y.o. female with a hx of HFpEF, permanent Afib, carcinoid lung tumor, hyponatremia (SIADH?) who presents for follow-up.  She reports she is doing well.  She is on torsemide 20 mg daily.  No increased weights.  Sodium has normalized.  She is followed with nephrology and things have returned back to normal.  She denies any chest pain or shortness of breath.  She is back in independent living.  She had a brief stay in a skilled nursing facility but has graduated back to independent living.  No major bleeding issues on Eliquis.  She will have dental surgery and needs to hold this.  I did inform her this is okay.  Her most recent LDL cholesterol is at goal.  She will continue her Crestor.  She denies any chest pain, shortness of breath in office today.  Blood pressure is 134/62.  Problem List 1. Permanent Afib -CHADSVASC=4 2. Carcinoid lung tumor  3. Cirrhosis  -on CT A/P 2020 4. Hyponatremia -SIADH? 5. HTN 6. HFpEF  -EF 60-65% -SOB, BNP 173  Past Medical History: Past Medical History:  Diagnosis Date  . Arthritis 1996  . Atrial fibrillation (HCC)    on coumadin  . Atrial tachycardia ([place])   . Cancer [facility]) 1988   breast cancer, R mastectomy, chemo  . 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 Diverticulitis 1999  . Femur fracture ([place]) 03/2013   left, non weight bearing for 2 1/2 weeks  . GERD (gastroesophageal reflux disease)   . Hemorrhoids 1991  . HTN (hypertension)   . Lymphedema of upper extremity    right arm  . Osteoporosis   . PONV (postoperative nausea and vomiting)   . Urinary, incontinence, stress female   . Vitamin  D deficiency disease     Past Surgical History: Past Surgical History:  Procedure Laterality Date  . APPENDECTOMY    . BREAST BIOPSY  05/04/1987   Right Dr Annamaria Boots  . BREAST IMPLANT REMOVAL Right 06/05/10  . BUNIONECTOMY  1988  . carcinoid Right    Right lower lobectomy 2017  . COLONOSCOPY W/ POLYPECTOMY    . EYE SURGERY  5/14 ; 6/14   Cataract Surgery  . JOINT REPLACEMENT     2002 left; 2009 right Hip, rt knee 2012  . KNEE ARTHROPLASTY Right 2012   [facility]  . LOBECTOMY Right 01/05/2016   Procedure: RIGHT LOWER LOBE LOBECTOMY;  Surgeon: Melrose Nakayama, MD;  Location: [place];  Service: Thoracic;  Laterality: Right;  . MASTECTOMY PARTIAL / LUMPECTOMY W/ AXILLARY LYMPHADENECTOMY  04/05/1987   Right - Dr Annamaria Boots  . RESECTION OF MEDIASTINAL MASS N/A 01/05/2016   Procedure: RESECTION OF PLEURAL MASS, right;  Surgeon: Melrose Nakayama, MD;  Location: [place];  Service: Thoracic;  Laterality: N/A;  . TONSILLECTOMY    . TOTAL HIP ARTHROPLASTY    . TUBAL LIGATION Bilateral 1970  . VAGINAL HYSTERECTOMY  1977   myomata  . VIDEO ASSISTED THORACOSCOPY (VATS)/WEDGE RESECTION Right 01/05/2016   Procedure: RIGHT VIDEO ASSISTED THORACOSCOPY (VATS)/WEDGE RESECTION;  Surgeon:  Melrose Nakayama, MD;  Location: [place];  Service: Thoracic;  Laterality: Right;    Current Medications: Current Meds  Medication Sig  . acetaminophen (TYLENOL) 500 MG tablet Take 2 tablets (1,000 mg total) by mouth every 6 (six) hours as needed for mild pain.  Marland Kitchen apixaban (ELIQUIS) 2.5 MG TABS tablet Take 1 tablet (2.5 mg total) by mouth 2 (two) times daily.  . bisacodyl (DULCOLAX) 5 MG EC tablet Take 1 tablet (5 mg total) by mouth daily as needed for severe constipation.  . Calcium Carbonate-Vitamin D (CALTRATE 600+D) 600-400 MG-UNIT per tablet Take 1 tablet by mouth daily.  . carboxymethylcellulose (REFRESH PLUS) 0.5 % SOLN Place 1 drop into both eyes 2 (two) times daily as needed (for dry eyes).  . carvedilol  (COREG) 6.25 MG tablet Take 1 tablet (6.25 mg total) by mouth 2 (two) times daily with a meal.  . Ergocalciferol (VITAMIN D2) 10 MCG (400 UNIT) TABS Take 400 Units by mouth daily.  . ferrous sulfate 325 (65 FE) MG tablet Take 325 mg by mouth. Once A Day on Mon, Fri  . lactose free nutrition (BOOST) LIQD Take 237 mLs by mouth 2 (two) times daily between meals.  . Multiple Vitamin (MULTIVITAMIN WITH MINERALS) TABS Take 1 tablet by mouth every evening.  . Omega-3 Fatty Acids (FISH OIL) 1000 MG CAPS Take 1 capsule by mouth 2 (two) times daily.  . pantoprazole (PROTONIX) 40 MG tablet Take 1 tablet (40 mg total) by mouth daily.  . polyethylene glycol (MIRALAX / GLYCOLAX) 17 g packet Take 17 g by mouth daily as needed for moderate constipation.  . potassium chloride SA (KLOR-CON) 20 MEQ tablet TAKE 2 TABLETS DAILY.  . rosuvastatin (CRESTOR) 10 MG tablet Take 1 tablet (10 mg total) by mouth at bedtime.  . torsemide (DEMADEX) 20 MG tablet Take 1 tablet (20 mg total) by mouth daily.  . [DISCONTINUED] apixaban (ELIQUIS) 5 MG TABS tablet Take 1 tablet (5 mg total) by mouth 2 (two) times daily.  . [DISCONTINUED] rosuvastatin (CRESTOR) 10 MG tablet Take 1 tablet (10 mg total) by mouth at bedtime.     Allergies:    Aspirin   Social History: Social History   Socioeconomic History  . Marital status: Widowed    Spouse name: Not on file  . Number of children: Not on file  . Years of education: Not on file  . Highest education level: Not on file  Occupational History  . Not on file  Tobacco Use  . Smoking status: Never Smoker  . Smokeless tobacco: Never Used  Vaping Use  . Vaping Use: Never used  Substance and Sexual Activity  . Alcohol use: No    Alcohol/week: 0.0 standard drinks  . Drug use: No  . Sexual activity: Not Currently    Partners: Male    Birth control/protection: Surgical    Comment: hysterectomy  Other Topics Concern  . Not on file  Social History Narrative  . Not on file    Social Determinants of Health   Financial Resource Strain:   . Difficulty of Paying Living Expenses: Not on file  Food Insecurity:   . Worried About Charity fundraiser in the Last Year: Not on file  . Ran Out of Food in the Last Year: Not on file  Transportation Needs:   . Lack of Transportation (Medical): Not on file  . Lack of Transportation (Non-Medical): Not on file  Physical Activity:   . Days of Exercise per  Week: Not on file  . Minutes of Exercise per Session: Not on file  Stress:   . Feeling of Stress : Not on file  Social Connections:   . Frequency of Communication with Friends and Family: Not on file  . Frequency of Social Gatherings with Friends and Family: Not on file  . Attends Religious Services: Not on file  . Active Member of Clubs or Organizations: Not on file  . Attends Archivist Meetings: Not on file  . Marital Status: Not on file     Family History: The patient's family history includes Colon cancer in her father and mother; Heart disease in her father.  ROS:   All other ROS reviewed and negative. Pertinent positives noted in the HPI.     EKGs/Labs/Other Studies Reviewed:   The following studies were personally reviewed by me today:  TTE 06/02/2020 1. Left ventricular ejection fraction, by estimation, is 60 to 65%. The  left ventricle has normal function. The left ventricle has no regional  wall motion abnormalities. There is mild left ventricular hypertrophy.  Left ventricular diastolic parameters  are indeterminate.  2. Right ventricular systolic function is normal. The right ventricular  size is normal. There is normal pulmonary artery systolic pressure.  3. Left atrial size was moderately dilated.  4. Right atrial size was severely dilated.  5. The mitral valve is abnormal. Mild mitral valve regurgitation.  6. Tricuspid valve regurgitation is mild to moderate.  7. The aortic valve is abnormal. Aortic valve regurgitation is  mild. Mild  aortic valve sclerosis is present, with no evidence of aortic valve  stenosis.   Recent Labs: 06/02/2020: TSH 1.293 06/04/2020: Magnesium 2.2 06/14/2020: B Natriuretic Peptide 425 08/09/2020: ALT 10; BUN 28; Creat 1.13; Hemoglobin 11.9; Platelets 169; Potassium 4.6; Sodium 139   Recent Lipid Panel    Component Value Date/Time   CHOL 201 (HH) 04/09/2007 0915   TRIG 50 04/09/2007 0915   HDL 65.9 04/09/2007 0915   CHOLHDL 3.1 CALC 04/09/2007 0915   VLDL 10 04/09/2007 0915   LDLDIRECT 107.8 04/09/2007 0915    Physical Exam:   VS:  BP 134/62 (BP Location: Left Arm, Patient Position: Sitting, Cuff Size: Normal)   Pulse 78   Ht 4\' 11"  (1.499 m)   Wt 124 lb (56.2 kg)   LMP 10/16/1975 (Approximate)   BMI 25.04 kg/m    Wt Readings from Last 3 Encounters:  08/29/20 124 lb (56.2 kg)  08/04/20 126 lb 3.2 oz (57.2 kg)  07/14/20 121 lb (54.9 kg)    General: Well nourished, well developed, in no acute distress Heart: Atraumatic, normal size  Eyes: PEERLA, EOMI  Neck: Supple, no JVD Endocrine: No thryomegaly Cardiac: Normal S1, S2; irregular rhythm, no murmurs rubs or gallops Lungs: Clear to auscultation bilaterally, no wheezing, rhonchi or rales  Abd: Soft, nontender, no hepatomegaly  Ext: No edema, pulses 2+ Musculoskeletal: No deformities, BUE and BLE strength normal and equal Skin: Warm and dry, no rashes   Neuro: Alert and oriented to person, place, time, and situation, CNII-XII grossly intact, no focal deficits  Psych: Normal mood and affect   ASSESSMENT:   [name] is a 86 y.o. female who presents for the following: 1. Chronic diastolic heart failure ([place])   2. Permanent atrial fibrillation ([place])   3. Essential hypertension   4. Mixed hyperlipidemia     PLAN:   1. Chronic diastolic heart failure (HCC) -Euvolemic on examination.  She will continue torsemide 20  mg daily.  2. Permanent atrial fibrillation ([place]) -She is in permanent atrial fibrillation.   Rate controlled on Coreg.   -She is over 53 years of age with a weight less than 60 kg.  We need to reduce her dose of Eliquis 2.5 mg twice daily. -I have also informed her that she can hold her Eliquis prior to any dental surgery and restart at the discretion of the dental surgeon.  3. Essential hypertension -Well-controlled on current medications.  No changes.  4. Mixed hyperlipidemia -Refill Crestor.  Disposition: Return in about 1 year (around 08/29/2021).  Medication Adjustments/Labs and Tests Ordered: Current medicines are reviewed at length with the patient today.  Concerns regarding medicines are outlined above.  No orders of the defined types were placed in this encounter.  Meds ordered this encounter  Medications  . rosuvastatin (CRESTOR) 10 MG tablet    Sig: Take 1 tablet (10 mg total) by mouth at bedtime.    Dispense:  90 tablet    Refill:  3  . apixaban (ELIQUIS) 2.5 MG TABS tablet    Sig: Take 1 tablet (2.5 mg total) by mouth 2 (two) times daily.    Dispense:  60 tablet    Refill:  11    Patient Instructions  Medication Instructions:  NO CHANGES *If you need a refill on your cardiac medications before your next appointment, please call your pharmacy*   Lab Work: NONE If you have labs (blood work) drawn today and your tests are completely normal, you will receive your results only by: Marland Kitchen MyChart Message (if you have MyChart) OR . A paper copy in the mail If you have any lab test that is abnormal or we need to change your treatment, we will call you to review the results.   Testing/Procedures: NONE   Follow-Up: At [facility], you and your health needs are our priority.  As part of our continuing mission to provide you with exceptional heart care, we have created designated Provider Care Teams.  These Care Teams include your primary Cardiologist (physician) and Advanced Practice Providers (APPs -  Physician Assistants and Nurse Practitioners) who all work  together to provide you with the care you need, when you need it.  We recommend signing up for the patient portal called "MyChart".  Sign up information is provided on this After Visit Summary.  MyChart is used to connect with patients for Virtual Visits (Telemedicine).  Patients are able to view lab/test results, encounter notes, upcoming appointments, etc.  Non-urgent messages can be sent to your provider as well.   To learn more about what you can do with MyChart, go to [email or web address].    Your next appointment:   1 year(s)  The format for your next appointment:   In Person  Provider:   Eleonore Chiquito, MD   Other Instructions NONE     Time Spent with Patient: I have spent a total of 35 minutes with patient reviewing hospital notes, telemetry, EKGs, labs and examining the patient as well as establishing an assessment and plan that was discussed with the patient.  > 50% of time was spent in direct patient care.  Signed, Addison Naegeli. Audie Box, [place]  [address] [place], [place] [address] [phone]  08/29/2020 3:53 PM

## 2020-08-29 ENCOUNTER — Other Ambulatory Visit: Payer: Self-pay

## 2020-08-29 ENCOUNTER — Encounter: Payer: Self-pay | Admitting: Cardiovascular Disease

## 2020-08-29 ENCOUNTER — Ambulatory Visit (INDEPENDENT_AMBULATORY_CARE_PROVIDER_SITE_OTHER): Payer: Medicare Other | Admitting: Cardiovascular Disease

## 2020-08-29 VITALS — BP 134/62 | HR 78 | Ht 59.0 in | Wt 124.0 lb

## 2020-08-29 DIAGNOSIS — I10 Essential (primary) hypertension: Secondary | ICD-10-CM

## 2020-08-29 DIAGNOSIS — E782 Mixed hyperlipidemia: Secondary | ICD-10-CM

## 2020-08-29 DIAGNOSIS — I4821 Permanent atrial fibrillation: Secondary | ICD-10-CM

## 2020-08-29 DIAGNOSIS — I5032 Chronic diastolic (congestive) heart failure: Secondary | ICD-10-CM

## 2020-08-29 MED ORDER — APIXABAN 2.5 MG PO TABS
2.5000 mg | ORAL_TABLET | Freq: Two times a day (BID) | ORAL | 11 refills | Status: DC
Start: 2020-08-29 — End: 2021-08-23

## 2020-08-29 MED ORDER — ROSUVASTATIN CALCIUM 10 MG PO TABS
10.0000 mg | ORAL_TABLET | Freq: Every day | ORAL | 3 refills | Status: DC
Start: 2020-08-29 — End: 2021-10-02

## 2020-08-29 NOTE — Patient Instructions (Addendum)
Medication Instructions:  NO CHANGES *If you need a refill on your cardiac medications before your next appointment, please call your pharmacy*   Lab Work: NONE If you have labs (blood work) drawn today and your tests are completely normal, you will receive your results only by: Marland Kitchen MyChart Message (if you have MyChart) OR . A paper copy in the mail If you have any lab test that is abnormal or we need to change your treatment, we will call you to review the results.   Testing/Procedures: NONE   Follow-Up: At [facility], you and your health needs are our priority.  As part of our continuing mission to provide you with exceptional heart care, we have created designated Provider Care Teams.  These Care Teams include your primary Cardiologist (physician) and Advanced Practice Providers (APPs -  Physician Assistants and Nurse Practitioners) who all work together to provide you with the care you need, when you need it.  We recommend signing up for the patient portal called "MyChart".  Sign up information is provided on this After Visit Summary.  MyChart is used to connect with patients for Virtual Visits (Telemedicine).  Patients are able to view lab/test results, encounter notes, upcoming appointments, etc.  Non-urgent messages can be sent to your provider as well.   To learn more about what you can do with MyChart, go to [email or web address].    Your next appointment:   1 year(s)  The format for your next appointment:   In Person  Provider:   Eleonore Chiquito, MD   Other Instructions NONE

## 2020-09-12 ENCOUNTER — Other Ambulatory Visit: Payer: Self-pay | Admitting: Nurse Practitioner

## 2020-09-22 ENCOUNTER — Encounter: Payer: Self-pay | Admitting: Nurse Practitioner

## 2020-09-22 ENCOUNTER — Other Ambulatory Visit: Payer: Self-pay

## 2020-09-22 ENCOUNTER — Non-Acute Institutional Stay: Payer: Medicare Other | Admitting: Nurse Practitioner

## 2020-09-22 DIAGNOSIS — K5901 Slow transit constipation: Secondary | ICD-10-CM

## 2020-09-22 DIAGNOSIS — N952 Postmenopausal atrophic vaginitis: Secondary | ICD-10-CM

## 2020-09-22 DIAGNOSIS — I5033 Acute on chronic diastolic (congestive) heart failure: Secondary | ICD-10-CM | POA: Diagnosis not present

## 2020-09-22 DIAGNOSIS — E871 Hypo-osmolality and hyponatremia: Secondary | ICD-10-CM

## 2020-09-22 DIAGNOSIS — I10 Essential (primary) hypertension: Secondary | ICD-10-CM | POA: Diagnosis not present

## 2020-09-22 DIAGNOSIS — R5383 Other fatigue: Secondary | ICD-10-CM

## 2020-09-22 DIAGNOSIS — K219 Gastro-esophageal reflux disease without esophagitis: Secondary | ICD-10-CM

## 2020-09-22 DIAGNOSIS — D500 Iron deficiency anemia secondary to blood loss (chronic): Secondary | ICD-10-CM

## 2020-09-22 DIAGNOSIS — I482 Chronic atrial fibrillation, unspecified: Secondary | ICD-10-CM | POA: Diagnosis not present

## 2020-09-22 DIAGNOSIS — E785 Hyperlipidemia, unspecified: Secondary | ICD-10-CM | POA: Insufficient documentation

## 2020-09-22 DIAGNOSIS — T50B95A Adverse effect of other viral vaccines, initial encounter: Secondary | ICD-10-CM

## 2020-09-22 NOTE — Assessment & Plan Note (Signed)
Atrophic vaginitis, takes Estradiol vaginal tab

## 2020-09-22 NOTE — Assessment & Plan Note (Signed)
CHF, chronic edema BLE, takesTorsemide, EF 60-65%, hospitalized 05/30/20-06/08/20 for acute on CHF, onTorsemide 20mg  qd..F/u cardiology 08/29/20, on 1200cc fluid restriction.

## 2020-09-22 NOTE — Progress Notes (Signed)
Location:   clinic [place]   Place of Service:  Clinic (12) Provider: Marlana Latus NP  Code Status: DNR Goals of Care: [place]ives 06/14/2020  Does Patient Have a Medical Advance Directive? Yes  Type of Advance Directive Living will;Healthcare Power of Attorney  Does patient want to make changes to medical advance directive? No - Patient declined  Copy of [place] in Chart? Yes - validated most recent copy scanned in chart (See row information)  Would patient like information on creating a medical advance directive? -  Pre-existing out of facility DNR order (yellow form or pink MOST form) -     No chief complaint on file.   HPI: Patient is a 86 y.o. female seen today for medical management of chronic diseases.    The patient stated she is on her last dose of Amoxicillin for s/p excision of a dental cyst at the previous root canal tx region. Had Covid booster vaccine yesterday, feeling tired, decreased appetite, uneasy stomach. No fever, nausea, vomiting, diarrhea. Started feeling better after lunch today.   Hyponatremia, 139 08/09/20 s/p Nephrology HTN, takes Coreg, Rosuvastatin. Bun/creat 28/1.13 08/09/20 Afib, takes Coreg, Eliquis CHF, chronic edema BLE, takesTorsemide, EF 60-65%, hospitalized 05/30/20-06/08/20 for acute on CHF, onTorsemide 73m qd..F/u cardiology 08/29/20, on 1200cc fluid restriction. Constipation,takesMiraLax, Bisacodyl.  Atrophic vaginitis, takes Estradiol vaginal tab GERD takes Pantoprazole Anemia, stable, Hgb 11.9 08/09/20, on Fe   Past Medical History:  Diagnosis Date  . Arthritis 1996  . Atrial fibrillation (HCC)    on coumadin  . [place]cardia ([facility]   . [facility]   breast cancer, R mastectomy, chemo  . Carcinoid bronchial adenoma of right lung ([facility] 01/05/2016   Right lower lobectomy  . Diverticulitis 1999  . [place]  fracture ([facility] 03/2013   left, non weight bearing for 2 1/2 weeks  . GERD (gastroesophageal reflux disease)   . Hemorrhoids 1991  . HTN (hypertension)   . Lymphedema of upper extremity    right arm  . Osteoporosis   . PONV (postoperative nausea and vomiting)   . Urinary, incontinence, stress female   . Vitamin D deficiency disease     Past Surgical History:  Procedure Laterality Date  . APPENDECTOMY    . BREAST BIOPSY  05/04/1987   Right Dr YAnnamaria Boots . BREAST IMPLANT REMOVAL Right 06/05/10  . BUNIONECTOMY  1988  . carcinoid Right    Right lower lobectomy 2017  . COLONOSCOPY W/ POLYPECTOMY    . EYE SURGERY  5/14 ; 6/14   Cataract Surgery  . JOINT REPLACEMENT     2002 left; 2009 right Hip, rt knee 2012  . KNEE ARTHROPLASTY Right 2012   [facility] . LOBECTOMY Right 01/05/2016   Procedure: RIGHT LOWER LOBE LOBECTOMY;  Surgeon: SMelrose Nakayama MD;  [facility] [facility]  Service: Thoracic;  Laterality: Right;  . MASTECTOMY PARTIAL / LUMPECTOMY W/ AXILLARY LYMPHADENECTOMY  04/05/1987   Right - Dr YAnnamaria Boots . RESECTION OF MEDIASTINAL MASS N/A 01/05/2016   Procedure: RESECTION OF PLEURAL MASS, right;  Surgeon: SMelrose Nakayama MD;  [place] [facility]  Service: Thoracic;  Laterality: N/A;  . TONSILLECTOMY    . TOTAL HIP ARTHROPLASTY    . TUBAL LIGATION Bilateral 1970  . VAGINAL HYSTERECTOMY  1977   myomata  . VIDEO ASSISTED THORACOSCOPY (VATS)/WEDGE RESECTION Right 01/05/2016   Procedure: RIGHT VIDEO ASSISTED THORACOSCOPY (VATS)/WEDGE RESECTION;  Surgeon: SMelrose Nakayama MD;  [place] [facility]  Service: Thoracic;  Laterality: Right;    Allergies  Allergen Reactions  . Aspirin Rash    Allergies as of 09/22/2020      Reactions   Aspirin Rash      Medication List       Accurate as of September 22, 2020 11:59 PM. If you have any questions, ask your nurse or doctor.        acetaminophen 500 MG tablet Commonly known as: TYLENOL Take 2 tablets (1,000 mg total) by mouth every  6 (six) hours as needed for mild pain.   apixaban 2.5 MG Tabs tablet Commonly known as: Eliquis Take 1 tablet (2.5 mg total) by mouth 2 (two) times daily.   bisacodyl 5 MG EC tablet Commonly known as: DULCOLAX Take 1 tablet (5 mg total) by mouth daily as needed for severe constipation.   Calcium Carbonate-Vitamin D 600-400 MG-UNIT tablet Take 1 tablet by mouth daily.   carboxymethylcellulose 0.5 % Soln Commonly known as: REFRESH PLUS Place 1 drop into both eyes 2 (two) times daily as needed (for dry eyes).   carvedilol 6.25 MG tablet Commonly known as: COREG Take 1 tablet (6.25 mg total) by mouth 2 (two) times daily with a meal.   ferrous sulfate 325 (65 FE) MG tablet Take 325 mg by mouth. Once A Day on Mon, Fri   Fish Oil 1000 MG Caps Take 1 capsule by mouth 2 (two) times daily.   lactose free nutrition Liqd Take 237 mLs by mouth 2 (two) times daily between meals.   multivitamin with minerals Tabs tablet Take 1 tablet by mouth every evening.   pantoprazole 40 MG tablet Commonly known as: PROTONIX TAKE 1 TABLET BY MOUTH DAILY.   polyethylene glycol 17 g packet Commonly known as: MIRALAX / GLYCOLAX Take 17 g by mouth daily as needed for moderate constipation.   potassium chloride SA 20 MEQ tablet Commonly known as: KLOR-CON TAKE 2 TABLETS DAILY.   rosuvastatin 10 MG tablet Commonly known as: CRESTOR Take 1 tablet (10 mg total) by mouth at bedtime.   torsemide 20 MG tablet Commonly known as: DEMADEX TAKE 1 TABLET BY MOUTH DAILY.   Vitamin D2 10 MCG (400 UNIT) Tabs Take 400 Units by mouth daily.       Review of Systems:  Review of Systems  Constitutional: Positive for appetite change and fatigue. Negative for fever.  HENT: Positive for hearing loss. Negative for congestion and trouble swallowing.   Eyes: Negative for visual disturbance.  Respiratory: Positive for cough. Negative for shortness of breath and wheezing.        Hx of right lower lobectomy  2017. Chronic cough remains the same. Uses O2 via [place] at night   Cardiovascular: Negative for leg swelling.  Gastrointestinal: Negative for abdominal pain and constipation.  Genitourinary: Positive for frequency. Negative for difficulty urinating, dysuria and urgency.  Musculoskeletal: Positive for arthralgias and gait problem.  Skin: Negative for color change.  Neurological: Negative for speech difficulty, weakness and headaches.  Psychiatric/Behavioral: Negative for behavioral problems and sleep disturbance. The patient is not nervous/anxious.     Health Maintenance  Topic Date Due  . PNA vac Low Risk Adult (2 of 2 - PCV13) 10/23/2015  . COVID-19 Vaccine (3 - Moderna risk 4-dose series) 12/14/2019  . TETANUS/TDAP  05/09/2020  . INFLUENZA VACCINE  Completed  . DEXA SCAN  Completed    Physical Exam: Vitals:   09/22/20 1512  BP: 116/74  Pulse: 63  Temp: (!) 97.3 F (  36.3 C)  SpO2: 100%  Weight: 130 lb 12.8 oz (59.3 kg)  Height: _0  (1.499 m)   Body mass index is 26.42 kg/m. Physical Exam Vitals and nursing note reviewed.  Constitutional:      Appearance: Normal appearance.  HENT:     Head: Normocephalic and atraumatic.     Mouth/Throat:     Mouth: Mucous membranes are moist.  Eyes:     Extraocular Movements: Extraocular movements intact.     Conjunctiva/sclera: Conjunctivae normal.     Pupils: Pupils are equal, round, and reactive to light.  Cardiovascular:     Rate and Rhythm: Normal rate. Rhythm irregular.     Heart sounds: No murmur heard.   Pulmonary:     Breath sounds: Rales present. No wheezing.     Comments: s/p right lung lobectomy 2017. Right lumpectomy 30 years ago. Posterior left basilar rales.  Abdominal:     General: Bowel sounds are normal.     Palpations: Abdomen is soft.     Tenderness: There is no abdominal tenderness. There is no rebound.  Musculoskeletal:     Cervical back: Normal range of motion and neck supple.     Right lower leg: No  edema.     Left lower leg: No edema.  Skin:    General: Skin is warm and dry.     Comments: Large bruise lateral left thigh, multiple ecchymoses left forearm  Neurological:     General: No focal deficit present.     Mental Status: She is alert and oriented to person, place, and time. Mental status is at baseline.     Motor: No weakness.     Coordination: Coordination normal.     Gait: Gait abnormal.  Psychiatric:        Mood and Affect: Mood normal.        Behavior: Behavior normal.        Thought Content: Thought content normal.        Judgment: Judgment normal.     Labs reviewed: Basic Metabolic Panel: Recent Labs    06/02/20 0117 06/02/20 0925 06/02/20 1430 06/03/20 0132 06/03/20 1109 06/04/20 0237 06/04/20 0804 06/07/20 1020 06/08/20 0719 06/14/20 0000 06/17/20 0000 06/23/20 0000 08/09/20 0735  NA 119* 119*   < > 118*   < > 117*   < > 130* 129*   < > 138 143 139  K 5.1 4.8  --  4.5   < > 5.1   < > 5.0 4.7   < > 3.4 4.3 4.6  CL 81* 85*  --  84*   < > 84*   < > 91* 87*   < > 88* 103 100  CO2 27 25  --  27   < > 18*   < > 29 30   < > 41* 30* 28  GLUCOSE 129* 124*  --  119*   < > 102*   < > 102* 107*  --   --   --  94  BUN 13 15  --  22   < > 40*   < > 27* 28*   < > 19 21 28*  CREATININE 0.94 0.99  --  1.14*   < > 1.15*   < > 0.87 1.06*   < > 1.1 0.8 1.13*  CALCIUM 8.4* 8.2*  --  8.2*   < > 8.8*   < > 8.9 9.3   < > 8.8 9.2 9.7  MG 1.5*  --   --  2.0  --  2.2  --   --   --   --   --   --   --   PHOS  --   --   --  3.5  --   --   --  4.0 4.2  --   --   --   --   TSH  --  1.293  --   --   --   --   --   --   --   --   --   --   --    < > = values in this interval not displayed.   Liver Function Tests: Recent Labs    06/02/20 0117 06/03/20 0132 06/07/20 1020 06/08/20 0719 06/14/20 0000 08/09/20 0735  AST 32 23  --   --  27 22  ALT 17 17  --   --  22 10  ALKPHOS 67 59  --   --  67  --   BILITOT 1.7* 0.9  --   --   --  1.2  PROT 5.9* 5.3*  --   --   --  6.7   ALBUMIN 2.9* 2.5* 2.6* 3.0* 3.2*  --    Recent Labs    06/02/20 0117  LIPASE 52*   No results for input(s): AMMONIA in the last 8760 hours. CBC: Recent Labs    06/03/20 0132 06/04/20 1520 06/14/20 0000 06/17/20 0000 08/09/20 0735  WBC 8.2 9.5 5.6 5.3 2.9*  NEUTROABS  --   --  4,799 4,367 1,934  HGB 10.7* 11.2* 10.0* 10.0* 11.9  HCT 32.6* 33.6* 31* 31* 36.9  MCV 93.7 93.6  --   --  100.5*  PLT 189 219 245 255 169   Lipid Panel: No results for input(s): CHOL, HDL, LDLCALC, TRIG, CHOLHDL, LDLDIRECT in the last 8760 hours. No results found for: HGBA1C  Procedures since last visit: No results found.  Assessment/Plan  Hyponatremia Hyponatremia, 139 08/09/20 s/p Nephrology  Essential hypertension HTN, takes Coreg, Rosuvastatin. Bun/creat 28/1.13 08/09/20   Atrial fibrillation, chronic (HCC) Afib, takes Coreg, Eliquis   Acute exacerbation of congestive heart failure (HCC) CHF, chronic edema BLE, takesTorsemide, EF 60-65%, hospitalized 05/30/20-06/08/20 for acute on CHF, onTorsemide 38m qd..F/u cardiology 08/29/20, on 1200cc fluid restriction.  Slow transit constipation Constipation,takesMiraLax, Bisacodyl.   Atrophic vaginitis Atrophic vaginitis, takes Estradiol vaginal tab  GERD (gastroesophageal reflux disease) GERD takes Pantoprazole   IDA (iron deficiency anemia) Anemia, stable, Hgb 11.9 08/09/20, on Fe   Hyperlipidemia Continue Rosuvastatin, update lipid panel.   Fatigue after COVID-19 vaccination The patient stated she is on her last dose of Amoxicillin for s/p excision of a dental cyst at the previous root canal tx region. Had Covid booster vaccine yesterday, feeling tired, decreased appetite, uneasy stomach. No fever, nausea, vomiting, diarrhea. Started feeling better after lunch today. Observe.     Labs/tests ordered:  CBC/diff, CMP/eGFR, lipid panel.   Next appt:  3-4 months with Dr. GLyndel Safe

## 2020-09-22 NOTE — Assessment & Plan Note (Signed)
The patient stated she is on her last dose of Amoxicillin for s/p excision of a dental cyst at the previous root canal tx region. Had Covid booster vaccine yesterday, feeling tired, decreased appetite, uneasy stomach. No fever, nausea, vomiting, diarrhea. Started feeling better after lunch today. Observe.

## 2020-09-22 NOTE — Assessment & Plan Note (Signed)
HTN, takes Coreg, Rosuvastatin. Bun/creat 28/1.13 08/09/20

## 2020-09-22 NOTE — Assessment & Plan Note (Signed)
Anemia, stable, Hgb 11.9 08/09/20, on Fe

## 2020-09-22 NOTE — Assessment & Plan Note (Signed)
Continue Rosuvastatin, update lipid panel.

## 2020-09-22 NOTE — Assessment & Plan Note (Signed)
GERD takes Pantoprazole

## 2020-09-22 NOTE — Assessment & Plan Note (Signed)
Afib, takes Coreg, Eliquis

## 2020-09-22 NOTE — Assessment & Plan Note (Signed)
Constipation,takesMiraLax, Bisacodyl.

## 2020-09-22 NOTE — Assessment & Plan Note (Signed)
Hyponatremia, 139 08/09/20 s/p Nephrology

## 2020-09-23 ENCOUNTER — Encounter: Payer: Self-pay | Admitting: Nurse Practitioner

## 2020-10-24 ENCOUNTER — Other Ambulatory Visit: Payer: Self-pay | Admitting: Internal Medicine

## 2020-10-24 NOTE — Telephone Encounter (Signed)
Patient has request refill on medication "Potassium Chloride". Patient last refill was 08/26/2020 with 60 tablets to be taken twice daily. Patient had 1 additional refill but is due for more. Not sure if this medication is long term. Medication pend and sent to PCP Virgie Dad, MD. Please Advise.

## 2020-11-08 NOTE — Progress Notes (Unsigned)
GYNECOLOGY  VISIT   HPI: 87 y.o.   Widowed White or Caucasian Not Hispanic or Latino  female   [phone] with Patient's last menstrual period was 10/16/1975 (approximate).   here for   Breast and pelvic exam. She has a h/o hysterectomy and of breast cancer. S.P right mastectomy in 1988.  H/O atrophic vaginitis, was on vagifem but ran out. She is doing okay without it. No vaginal bleeding.   She has urge incontinence only. She leaks a small amount every day, changes her pad 2-3 x a day. Stable and tolerable.  Last week she had 3 hair follicles get abscesses on her vulva, getting better.   M[facility]ogram at [facility], last in 12/21, normal (in media)  She lives at [facility], likes it there. She has 3 kids, 6 grand kids and one great granddaughter.    GYNECOLOGIC HISTORY: Patient's last menstrual period was 10/16/1975 (approximate). Contraception: hysterectomy Menopausal hormone therapy: none        OB History    Gravida  5   Para  3   Term  3   Preterm  0   AB  2   Living  3     SAB  2   IAB  0   Ectopic  0   Multiple  0   Live Births  3              Patient Active Problem List   Diagnosis Date Noted  . Hyperlipidemia 09/22/2020  . Fatigue after COVID-19 vaccination 09/22/2020  . Epistaxis, recurrent 08/04/2020  . IDA (iron deficiency anemia) 07/14/2020  . Sensation of chest pressure 06/13/2020  . Multiple ecchymoses of thigh 06/09/2020  . GERD (gastroesophageal reflux disease) 06/09/2020  . Atrophic vaginitis 06/09/2020  . Acute hypoxemic respiratory failure ([place]) 06/02/2020  . Acute heart failure ([place]) 06/01/2020  . Acute exacerbation of congestive heart failure ([place]) 05/30/2020  . Malignant neoplasm of upper-outer quadrant of right female breast ([place]) 07/09/2016  . Abdominal pain 06/02/2016  . Nausea 06/02/2016  . 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 Sinus pause 01/17/2016  . Essential hypertension   . Carcinoid bronchial adenoma  of right lung ([place]) 01/05/2016  . Long term current use of anticoagulant therapy 05/09/2013  . Osteoporosis 05/09/2013  . Slow transit constipation 05/08/2013  . Hip fracture, left ([place]) 04/19/2013  . Hyponatremia 04/19/2013  . Atrial fibrillation, chronic ([place]) 04/19/2013  . Lung nodule 04/19/2013  . OSTEOARTHRITIS 01/13/2007  . OSTEOPENIA 01/13/2007    Past Medical History:  Diagnosis Date  . Arthritis 1996  . Atrial fibrillation (HCC)    on coumadin  . Atrial tachycardia ([place])   . Cancer [facility] 1988   breast cancer, R mastectomy, chemo  . Carcinoid bronchial adenoma of right lung ([place]) 01/05/2016   Right lower lobectomy  . Diverticulitis 1999  . Femur fracture ([place]) 03/2013   left, non weight bearing for 2 1/2 weeks  . GERD (gastroesophageal reflux disease)   . Hemorrhoids 1991  . HTN (hypertension)   . Lymphedema of upper extremity    right arm  . Osteoporosis   . PONV (postoperative nausea and vomiting)   . Urinary, incontinence, stress female   . Vitamin D deficiency disease     Past Surgical History:  Procedure Laterality Date  . APPENDECTOMY    . BREAST BIOPSY  05/04/1987   Right Dr Annamaria Boots  . BREAST IMPLANT REMOVAL Right 06/05/10  . BUNIONECTOMY  1988  . carcinoid Right  Right lower lobectomy 2017  . COLONOSCOPY W/ POLYPECTOMY    . EYE SURGERY  5/14 ; 6/14   Cataract Surgery  . JOINT REPLACEMENT     2002 left; 2009 right Hip, rt knee 2012  . KNEE ARTHROPLASTY Right 2012   [facility]  . LOBECTOMY Right 01/05/2016   Procedure: RIGHT LOWER LOBE LOBECTOMY;  Surgeon: Melrose Nakayama, MD;  Location: [place];  Service: Thoracic;  Laterality: Right;  . MASTECTOMY PARTIAL / LUMPECTOMY W/ AXILLARY LYMPHADENECTOMY  04/05/1987   Right - Dr Annamaria Boots  . RESECTION OF MEDIASTINAL MASS N/A 01/05/2016   Procedure: RESECTION OF PLEURAL MASS, right;  Surgeon: Melrose Nakayama, MD;  Location: [place];  Service: Thoracic;  Laterality: N/A;  . TONSILLECTOMY    . TOTAL HIP  ARTHROPLASTY    . TUBAL LIGATION Bilateral 1970  . VAGINAL HYSTERECTOMY  1977   myomata  . VIDEO ASSISTED THORACOSCOPY (VATS)/WEDGE RESECTION Right 01/05/2016   Procedure: RIGHT VIDEO ASSISTED THORACOSCOPY (VATS)/WEDGE RESECTION;  Surgeon: Melrose Nakayama, MD;  Location: [place];  Service: Thoracic;  Laterality: Right;    Current Outpatient Medications  Medication Sig Dispense Refill  . acetaminophen (TYLENOL) 500 MG tablet Take 2 tablets (1,000 mg total) by mouth every 6 (six) hours as needed for mild pain. 30 tablet 0  . apixaban (ELIQUIS) 2.5 MG TABS tablet Take 1 tablet (2.5 mg total) by mouth 2 (two) times daily. 60 tablet 11  . bisacodyl (DULCOLAX) 5 MG EC tablet Take 1 tablet (5 mg total) by mouth daily as needed for severe constipation.    . Calcium Carbonate-Vitamin D 600-400 MG-UNIT tablet Take 1 tablet by mouth daily.    . carboxymethylcellulose (REFRESH PLUS) 0.5 % SOLN Place 1 drop into both eyes 2 (two) times daily as needed (for dry eyes).    . carvedilol (COREG) 6.25 MG tablet Take 1 tablet (6.25 mg total) by mouth 2 (two) times daily with a meal. 60 tablet 0  . Ergocalciferol (VITAMIN D2) 10 MCG (400 UNIT) TABS Take 400 Units by mouth daily. 30 tablet 0  . ferrous sulfate 325 (65 FE) MG tablet Take 325 mg by mouth. Once A Day on Mon, Fri    . ketoconazole (NIZORAL) 2 % cream SMARTSIG:1 Topical Every Night    . lactose free nutrition (BOOST) LIQD Take 237 mLs by mouth 2 (two) times daily between meals.    . Multiple Vitamin (MULTIVITAMIN WITH MINERALS) TABS Take 1 tablet by mouth every evening.    . Omega-3 Fatty Acids (FISH OIL) 1000 MG CAPS Take 1 capsule by mouth 2 (two) times daily.    . pantoprazole (PROTONIX) 40 MG tablet TAKE 1 TABLET BY MOUTH DAILY. 30 tablet 5  . polyethylene glycol (MIRALAX / GLYCOLAX) 17 g packet Take 17 g by mouth daily as needed for moderate constipation. 14 each 2  . potassium chloride SA (KLOR-CON) 20 MEQ tablet TAKE 2 TABLETS DAILY. 60 tablet  0  . rosuvastatin (CRESTOR) 10 MG tablet Take 1 tablet (10 mg total) by mouth at bedtime. 90 tablet 3  . torsemide (DEMADEX) 20 MG tablet TAKE 1 TABLET BY MOUTH DAILY. 30 tablet 5   No current facility-administered medications for this visit.     ALLERGIES: Aspirin  Family History  Problem Relation Age of Onset  . Colon cancer Mother   . Colon cancer Father   . Heart disease Father     Social History   Socioeconomic History  . Marital status: Widowed  Spouse name: Not on file  . Number of children: Not on file  . Years of education: Not on file  . Highest education level: Not on file  Occupational History  . Not on file  Tobacco Use  . Smoking status: Never Smoker  . Smokeless tobacco: Never Used  Vaping Use  . Vaping Use: Never used  Substance and Sexual Activity  . Alcohol use: No    Alcohol/week: 0.0 standard drinks  . Drug use: No  . Sexual activity: Not Currently    Partners: Male    Birth control/protection: Surgical    Comment: hysterectomy  Other Topics Concern  . Not on file  Social History Narrative  . Not on file   Social Determinants of Health   Financial Resource Strain: Not on file  Food Insecurity: Not on file  Transportation Needs: Not on file  Physical Activity: Not on file  Stress: Not on file  Social Connections: Not on file  Intimate Partner Violence: Not on file    Review of Systems  Constitutional: Negative.   HENT: Negative.   Eyes: Negative.   Respiratory: Negative.   Cardiovascular: Negative.   Gastrointestinal: Negative.   Genitourinary: Negative.   Musculoskeletal: Negative.   Skin: Negative.   Neurological: Negative.   Endo/Heme/Allergies: Negative.   Psychiatric/Behavioral: Negative.     PHYSICAL EXAMINATION:    BP 110/62 (BP Location: Left Arm, Patient Position: Sitting, Cuff Size: Normal)   Pulse 76   Ht 4' 11.75" (1.518 m)   Wt 123 lb (55.8 kg)   LMP 10/16/1975 (Approximate)   BMI 24.22 kg/m     General  appearance: alert, cooperative and appears stated age Neck: no adenopathy, supple, symmetrical, trachea midline and thyroid normal to inspection and palpation Breasts: normal appearance, no masses or tenderness, right mastectomy, no chest wall masses.  Abdomen: soft, non-tender; non distended, no masses,  no organomegaly  Pelvic: External genitalia:  no lesions              Urethra:  normal appearing urethra with no masses, tenderness or lesions              Bartholins and Skenes: normal                 Vagina: atrophic appearing vagina with normal color and discharge, no lesions              Cervix: absent              Bimanual Exam:  Uterus:  uterus absent              Adnexa: no mass, fullness, tenderness              Rectovaginal: Yes.  .  Confirms.              Anus:  normal sphincter tone, no lesions  Chaperone was present for exam.  1. GYN exam for high-risk Medicare patient No concerning findings on exam  2. History of breast cancer Mammogram UTD, normal breast exam  3. Vaginal atrophy Doing okay off of estrogen  4. Urge incontinence Stable and tolerable, declines medication or PT.

## 2020-11-09 ENCOUNTER — Other Ambulatory Visit: Payer: Self-pay

## 2020-11-09 ENCOUNTER — Encounter: Payer: Self-pay | Admitting: Obstetrics and Gynecology

## 2020-11-09 ENCOUNTER — Ambulatory Visit (INDEPENDENT_AMBULATORY_CARE_PROVIDER_SITE_OTHER): Payer: Medicare Other | Admitting: Obstetrics and Gynecology

## 2020-11-09 VITALS — BP 110/62 | HR 76 | Ht 59.75 in | Wt 123.0 lb

## 2020-11-09 DIAGNOSIS — N952 Postmenopausal atrophic vaginitis: Secondary | ICD-10-CM

## 2020-11-09 DIAGNOSIS — Z9189 Other specified personal risk factors, not elsewhere classified: Secondary | ICD-10-CM

## 2020-11-09 DIAGNOSIS — Z853 Personal history of malignant neoplasm of breast: Secondary | ICD-10-CM

## 2020-11-09 DIAGNOSIS — N3941 Urge incontinence: Secondary | ICD-10-CM

## 2020-11-09 NOTE — Patient Instructions (Signed)

## 2020-11-13 ENCOUNTER — Other Ambulatory Visit: Payer: Self-pay | Admitting: Nurse Practitioner

## 2020-11-20 ENCOUNTER — Other Ambulatory Visit: Payer: Self-pay | Admitting: Internal Medicine

## 2020-12-18 ENCOUNTER — Other Ambulatory Visit: Payer: Self-pay | Admitting: Internal Medicine

## 2021-01-05 ENCOUNTER — Other Ambulatory Visit: Payer: Self-pay

## 2021-01-05 DIAGNOSIS — D500 Iron deficiency anemia secondary to blood loss (chronic): Secondary | ICD-10-CM

## 2021-01-05 DIAGNOSIS — I10 Essential (primary) hypertension: Secondary | ICD-10-CM

## 2021-01-05 DIAGNOSIS — E871 Hypo-osmolality and hyponatremia: Secondary | ICD-10-CM

## 2021-01-05 LAB — CBC WITH DIFFERENTIAL/PLATELET
Absolute Monocytes: 426 cells/uL (ref 200–950)
Basophils Absolute: 22 cells/uL (ref 0–200)
Basophils Relative: 0.5 %
Eosinophils Absolute: 30 cells/uL (ref 15–500)
Eosinophils Relative: 0.7 %
HCT: 37.1 % (ref 35.0–45.0)
Hemoglobin: 11.8 g/dL (ref 11.7–15.5)
Lymphs Abs: 778 cells/uL — ABNORMAL LOW (ref 850–3900)
MCH: 31.9 pg (ref 27.0–33.0)
MCHC: 31.8 g/dL — ABNORMAL LOW (ref 32.0–36.0)
MCV: 100.3 fL — ABNORMAL HIGH (ref 80.0–100.0)
MPV: 9.9 fL (ref 7.5–12.5)
Monocytes Relative: 9.9 %
Neutro Abs: 3044 cells/uL (ref 1500–7800)
Neutrophils Relative %: 70.8 %
Platelets: 153 10*3/uL (ref 140–400)
RBC: 3.7 10*6/uL — ABNORMAL LOW (ref 3.80–5.10)
RDW: 12.6 % (ref 11.0–15.0)
Total Lymphocyte: 18.1 %
WBC: 4.3 10*3/uL (ref 3.8–10.8)

## 2021-01-05 LAB — COMPLETE METABOLIC PANEL WITH GFR
AG Ratio: 1.6 (calc) (ref 1.0–2.5)
ALT: 10 U/L (ref 6–29)
AST: 19 U/L (ref 10–35)
Albumin: 4.1 g/dL (ref 3.6–5.1)
Alkaline phosphatase (APISO): 62 U/L (ref 37–153)
BUN/Creatinine Ratio: 29 (calc) — ABNORMAL HIGH (ref 6–22)
BUN: 40 mg/dL — ABNORMAL HIGH (ref 7–25)
CO2: 31 mmol/L (ref 20–32)
Calcium: 9.5 mg/dL (ref 8.6–10.4)
Chloride: 101 mmol/L (ref 98–110)
Creat: 1.37 mg/dL — ABNORMAL HIGH (ref 0.60–0.88)
GFR, Est African American: 41 mL/min/{1.73_m2} — ABNORMAL LOW (ref >=60)
GFR, Est Non African American: 35 mL/min/{1.73_m2} — ABNORMAL LOW (ref >=60)
Globulin: 2.6 g/dL (calc) (ref 1.9–3.7)
Glucose, Bld: 91 mg/dL (ref 65–99)
Potassium: 5 mmol/L (ref 3.5–5.3)
Sodium: 142 mmol/L (ref 135–146)
Total Bilirubin: 0.9 mg/dL (ref 0.2–1.2)
Total Protein: 6.7 g/dL (ref 6.1–8.1)

## 2021-01-05 LAB — LIPID PANEL
Cholesterol: 119 mg/dL (ref <200)
HDL: 50 mg/dL (ref >=50)
LDL Cholesterol (Calc): 52 mg/dL (calc)
Non-HDL Cholesterol (Calc): 69 mg/dL (calc) (ref <130)
Total CHOL/HDL Ratio: 2.4 (calc) (ref <5.0)
Triglycerides: 89 mg/dL (ref <150)

## 2021-01-06 ENCOUNTER — Encounter: Payer: Self-pay | Admitting: Nurse Practitioner

## 2021-01-06 DIAGNOSIS — N183 Chronic kidney disease, stage 3 unspecified: Secondary | ICD-10-CM | POA: Insufficient documentation

## 2021-01-13 ENCOUNTER — Encounter: Payer: Self-pay | Admitting: Internal Medicine

## 2021-01-13 ENCOUNTER — Other Ambulatory Visit: Payer: Self-pay

## 2021-01-13 ENCOUNTER — Non-Acute Institutional Stay: Payer: Medicare Other | Admitting: Internal Medicine

## 2021-01-13 VITALS — BP 92/60 | HR 72 | Temp 97.0°F | Ht 59.75 in | Wt 127.8 lb

## 2021-01-13 DIAGNOSIS — I10 Essential (primary) hypertension: Secondary | ICD-10-CM

## 2021-01-13 DIAGNOSIS — I5032 Chronic diastolic (congestive) heart failure: Secondary | ICD-10-CM

## 2021-01-13 DIAGNOSIS — I482 Chronic atrial fibrillation, unspecified: Secondary | ICD-10-CM | POA: Diagnosis not present

## 2021-01-13 DIAGNOSIS — E871 Hypo-osmolality and hyponatremia: Secondary | ICD-10-CM | POA: Diagnosis not present

## 2021-01-13 DIAGNOSIS — K219 Gastro-esophageal reflux disease without esophagitis: Secondary | ICD-10-CM

## 2021-01-13 DIAGNOSIS — D500 Iron deficiency anemia secondary to blood loss (chronic): Secondary | ICD-10-CM

## 2021-01-13 DIAGNOSIS — E785 Hyperlipidemia, unspecified: Secondary | ICD-10-CM

## 2021-01-13 DIAGNOSIS — N952 Postmenopausal atrophic vaginitis: Secondary | ICD-10-CM

## 2021-01-13 NOTE — Patient Instructions (Signed)
Can increase your Water intake to 1700 cc Cut back you Demadex to 5 days a week. Don't take it on Wed and Fri Reduce potassium to one tablet everyday Can stop Iron as Hgb is now normal

## 2021-01-14 NOTE — Progress Notes (Signed)
Location:  [place] of Service:  Clinic (12)  Provider:   Code Status:  Goals of Care:  Advanced Directives 06/14/2020  Does Patient Have a Medical Advance Directive? Yes  Type of Advance Directive Living will;Healthcare Power of Attorney  Does patient want to make changes to medical advance directive? No - Patient declined  Copy of [place] in Chart? Yes - validated most recent copy scanned in chart (See row information)  Would patient like information on creating a medical advance directive? -  Pre-existing out of facility DNR order (yellow form or pink MOST form) -     No chief complaint on file.   HPI: Patient is a 87 y.o. female seen today for medical management of chronic diseases.   Patient has a history of chronic A. 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 Chronic CHF Is doing well on Demadex.  Normal lower extremity edema no shortness of breath.  Does use oxygen at night. Hyponatremia She continues to be on fluid restriction.  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  Has not had any issues with fall.  States independent with her ADLs    Past Medical History:  Diagnosis Date  . Arthritis 1996  . Atrial fibrillation (HCC)    on coumadin  . Atrial tachycardia ([place])   . Cancer [facility]) 1988   breast cancer, R mastectomy, chemo  . 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 Diverticulitis  1999  . Femur fracture ([place]) 03/2013   left, non weight bearing for 2 1/2 weeks  . GERD (gastroesophageal reflux disease)   . Hemorrhoids 1991  . HTN (hypertension)   . Lymphedema of upper extremity    right arm  . Osteoporosis   . PONV (postoperative nausea and vomiting)   . Urinary, incontinence, stress female   . Vitamin D deficiency disease     Past Surgical History:  Procedure Laterality Date  . APPENDECTOMY    . BREAST BIOPSY  05/04/1987   Right Dr Annamaria Boots  . BREAST IMPLANT REMOVAL Right 06/05/10  . BUNIONECTOMY  1988  . carcinoid Right    Right lower lobectomy 2017  . COLONOSCOPY W/ POLYPECTOMY    . EYE SURGERY  5/14 ; 6/14   Cataract Surgery  . JOINT REPLACEMENT     2002 left; 2009 right Hip, rt knee 2012  . KNEE ARTHROPLASTY Right 2012   [facility]  . LOBECTOMY Right 01/05/2016   Procedure: RIGHT LOWER LOBE LOBECTOMY;  Surgeon: Melrose Nakayama, MD;  Location: [place];  Service: Thoracic;  Laterality: Right;  . MASTECTOMY PARTIAL / LUMPECTOMY W/ AXILLARY LYMPHADENECTOMY  04/05/1987   Right - Dr Annamaria Boots  . RESECTION OF MEDIASTINAL MASS N/A 01/05/2016   Procedure: RESECTION OF PLEURAL MASS, right;  Surgeon: Melrose Nakayama, MD;  Location: [place];  Service: Thoracic;  Laterality: N/A;  . TONSILLECTOMY    . TOTAL HIP ARTHROPLASTY    .  TUBAL LIGATION Bilateral 1970  . VAGINAL HYSTERECTOMY  1977   myomata  . VIDEO ASSISTED THORACOSCOPY (VATS)/WEDGE RESECTION Right 01/05/2016   Procedure: RIGHT VIDEO ASSISTED THORACOSCOPY (VATS)/WEDGE RESECTION;  Surgeon: Melrose Nakayama, MD;  Location: [place];  Service: Thoracic;  Laterality: Right;    Allergies  Allergen Reactions  . Aspirin Rash    Outpatient Encounter Medications as of 01/13/2021  Medication Sig  . acetaminophen (TYLENOL) 500 MG tablet Take 2 tablets (1,000 mg total) by mouth every 6 (six) hours as needed for mild pain.  Marland Kitchen apixaban (ELIQUIS) 2.5 MG TABS tablet Take 1 tablet (2.5 mg total) by mouth 2 (two)  times daily.  . bisacodyl (DULCOLAX) 5 MG EC tablet Take 1 tablet (5 mg total) by mouth daily as needed for severe constipation.  . Calcium Carbonate-Vitamin D 600-400 MG-UNIT tablet Take 1 tablet by mouth daily.  . carboxymethylcellulose (REFRESH PLUS) 0.5 % SOLN Place 1 drop into both eyes 2 (two) times daily as needed (for dry eyes).  . carvedilol (COREG) 6.25 MG tablet TAKE 1 TABLET BY MOUTH TWICE DAILY WITH A MEAL.  . Ergocalciferol (VITAMIN D2) 10 MCG (400 UNIT) TABS Take 400 Units by mouth daily.  . ferrous sulfate 325 (65 FE) MG tablet Take 325 mg by mouth. Once A Day on Mon, Fri  . ketoconazole (NIZORAL) 2 % cream SMARTSIG:1 Topical Every Night  . lactose free nutrition (BOOST) LIQD Take 237 mLs by mouth 2 (two) times daily between meals.  . Multiple Vitamin (MULTIVITAMIN WITH MINERALS) TABS Take 1 tablet by mouth every evening.  . Omega-3 Fatty Acids (FISH OIL) 1000 MG CAPS Take 1 capsule by mouth 2 (two) times daily.  . pantoprazole (PROTONIX) 40 MG tablet TAKE 1 TABLET BY MOUTH DAILY.  Marland Kitchen polyethylene glycol (MIRALAX / GLYCOLAX) 17 g packet Take 17 g by mouth daily as needed for moderate constipation.  . potassium chloride SA (KLOR-CON) 20 MEQ tablet TAKE 2 TABLETS DAILY.  . rosuvastatin (CRESTOR) 10 MG tablet Take 1 tablet (10 mg total) by mouth at bedtime.  . torsemide (DEMADEX) 20 MG tablet TAKE 1 TABLET BY MOUTH DAILY.   No facility-administered encounter medications on file as of 01/13/2021.    Review of Systems:  Review of Systems  Constitutional: Negative.   HENT: Negative.   Respiratory: Negative.   Cardiovascular: Negative.   Gastrointestinal: Negative.   Genitourinary: Negative.   Musculoskeletal: Negative.   Skin: Negative.   Neurological: Positive for dizziness.  Psychiatric/Behavioral: Negative.     Health Maintenance  Topic Date Due  . PNA vac Low Risk Adult (2 of 2 - PCV13) 10/23/2015  . COVID-19 Vaccine (3 - Moderna risk 4-dose series) 12/14/2019  .  TETANUS/TDAP  05/09/2020  . INFLUENZA VACCINE  05/15/2021  . DEXA SCAN  Completed  . HPV VACCINES  Aged Out    Physical Exam: Vitals:   01/13/21 1126  BP: 92/60  Pulse: 72  Temp: (!) 97 F (36.1 C)  SpO2: 98%  Weight: 127 lb 12.8 oz (58 kg)  Height: 4' 11.75" (1.518 m)   Body mass index is 25.17 kg/m. Physical Exam  Constitutional: Oriented to person, place, and time. Well-developed and well-nourished.  HENT:  Head: Normocephalic.  Mouth/Throat: Oropharynx is clear and moist.  Eyes: Pupils are equal, round, and reactive to light.  Neck: Neck supple.  Cardiovascular: Normal rate and normal heart sounds.  No murmur heard. Pulmonary/Chest: Effort normal and breath sounds normal. No respiratory distress. No wheezes. She has  no rales.  Abdominal: Soft. Bowel sounds are normal. No distension. There is no tenderness. There is no rebound.  Musculoskeletal: No edema.  Lymphadenopathy: none Neurological: Alert and oriented to person, place, and time.  Skin: Skin is warm and dry.  Psychiatric: Normal mood and affect. Behavior is normal. Thought content normal.   Labs reviewed: Basic Metabolic Panel: Recent Labs    06/02/20 0117 06/02/20 0925 06/02/20 1430 06/03/20 0132 06/03/20 1109 06/04/20 0237 06/04/20 0804 06/07/20 1020 06/08/20 0719 06/14/20 0000 06/23/20 0000 08/09/20 0735 01/05/21 0715  NA 119* 119*   < > 118*   < > 117*   < > 130* 129*   < > 143 139 142  K 5.1 4.8  --  4.5   < > 5.1   < > 5.0 4.7   < > 4.3 4.6 5.0  CL 81* 85*  --  84*   < > 84*   < > 91* 87*   < > 103 100 101  CO2 27 25  --  27   < > 18*   < > 29 30   < > 30* 28 31  GLUCOSE 129* 124*  --  119*   < > 102*   < > 102* 107*  --   --  94 91  BUN 13 15  --  22   < > 40*   < > 27* 28*   < > 21 28* 40*  CREATININE 0.94 0.99  --  1.14*   < > 1.15*   < > 0.87 1.06*   < > 0.8 1.13* 1.37*  CALCIUM 8.4* 8.2*  --  8.2*   < > 8.8*   < > 8.9 9.3   < > 9.2 9.7 9.5  MG 1.5*  --   --  2.0  --  2.2  --   --   --    --   --   --   --   PHOS  --   --   --  3.5  --   --   --  4.0 4.2  --   --   --   --   TSH  --  1.293  --   --   --   --   --   --   --   --   --   --   --    < > = values in this interval not displayed.   Liver Function Tests: Recent Labs    06/02/20 0117 06/03/20 0132 06/07/20 1020 06/08/20 0719 06/14/20 0000 08/09/20 0735 01/05/21 0715  AST 32 23  --   --  27 22 19   ALT 17 17  --   --  22 10 10   ALKPHOS 67 59  --   --  67  --   --   BILITOT 1.7* 0.9  --   --   --  1.2 0.9  PROT 5.9* 5.3*  --   --   --  6.7 6.7  ALBUMIN 2.9* 2.5* 2.6* 3.0* 3.2*  --   --    Recent Labs    06/02/20 0117  LIPASE 52*   No results for input(s): AMMONIA in the last 8760 hours. CBC: Recent Labs    06/04/20 1520 06/14/20 0000 06/17/20 0000 08/09/20 0735 01/05/21 0715  WBC 9.5   < > 5.3 2.9* 4.3  NEUTROABS  --    < > 4,367 1,934 3,044  HGB 11.2*   < >  10.0* 11.9 11.8  HCT 33.6*   < > 31* 36.9 37.1  MCV 93.6  --   --  100.5* 100.3*  PLT 219   < > 255 169 153   < > = values in this interval not displayed.   Lipid Panel: Recent Labs    01/05/21 0715  CHOL 119  HDL 50  LDLCALC 52  TRIG 89  CHOLHDL 2.4   No results found for: HGBA1C  Procedures since last visit: No results found.  Assessment/Plan Hyponatremia Sodium in Normal Level Can Increase FR to 1700 cc/24 hours Repeat BMP Essential hypertension BP low Also c/o Dizziness Decrease Demadex to 5 days a week Increase Fluid intake Atrial fibrillation, chronic (HCC) On Coreg and Eliquis Follows with Cardiology Chronic diastolic congestive heart failure (HCC) Seems on dryer side with Low BP and Dizziness Reduce Demadex to 5 days a week Will let us know if Swelling and SOB Gets worse  Atrophic vaginitis On Estrogen  Iron deficiency anemia due to chronic blood loss Hgb normal  Can discontinue Iron Hyperlipidemia, unspecified hyperlipidemia type Good levels on Statin Gastroesophageal reflux disease, unspecified  whether esophagitis present On Protonix  H/OCarcinoid bronchial adenoma of right lung ([place]) Recent Imaging in 5/21 showed stable nodules Has Appointment with surgeon  Will Repeat BMP in 4 weeks to follow Sodium and BUn  Labs/tests ordered:  * No order type specified * Next appt:  02/02/2021

## 2021-01-24 ENCOUNTER — Other Ambulatory Visit: Payer: Self-pay | Admitting: Thoracic Surgery (Cardiothoracic Vascular Surgery)

## 2021-01-24 DIAGNOSIS — C7A090 Malignant carcinoid tumor of the bronchus and lung: Secondary | ICD-10-CM

## 2021-01-30 ENCOUNTER — Other Ambulatory Visit: Payer: Self-pay | Admitting: Internal Medicine

## 2021-02-02 ENCOUNTER — Other Ambulatory Visit: Payer: Self-pay

## 2021-02-02 DIAGNOSIS — I5032 Chronic diastolic (congestive) heart failure: Secondary | ICD-10-CM

## 2021-02-02 DIAGNOSIS — E871 Hypo-osmolality and hyponatremia: Secondary | ICD-10-CM

## 2021-02-02 DIAGNOSIS — I10 Essential (primary) hypertension: Secondary | ICD-10-CM

## 2021-02-02 DIAGNOSIS — I482 Chronic atrial fibrillation, unspecified: Secondary | ICD-10-CM

## 2021-02-03 LAB — BASIC METABOLIC PANEL WITH GFR
BUN/Creatinine Ratio: 28 (calc) — ABNORMAL HIGH (ref 6–22)
BUN: 28 mg/dL — ABNORMAL HIGH (ref 7–25)
CO2: 27 mmol/L (ref 20–32)
Calcium: 9.3 mg/dL (ref 8.6–10.4)
Chloride: 99 mmol/L (ref 98–110)
Creat: 1.01 mg/dL — ABNORMAL HIGH (ref 0.60–0.88)
GFR, Est African American: 59 mL/min/{1.73_m2} — ABNORMAL LOW (ref >=60)
GFR, Est Non African American: 51 mL/min/{1.73_m2} — ABNORMAL LOW (ref >=60)
Glucose, Bld: 75 mg/dL (ref 65–99)
Potassium: 4.7 mmol/L (ref 3.5–5.3)
Sodium: 137 mmol/L (ref 135–146)

## 2021-02-03 LAB — TSH: TSH: 1.98 mIU/L (ref 0.40–4.50)

## 2021-02-09 ENCOUNTER — Non-Acute Institutional Stay: Payer: Medicare Other | Admitting: Nurse Practitioner

## 2021-02-09 ENCOUNTER — Other Ambulatory Visit: Payer: Self-pay

## 2021-02-09 ENCOUNTER — Encounter: Payer: Self-pay | Admitting: Nurse Practitioner

## 2021-02-09 DIAGNOSIS — I10 Essential (primary) hypertension: Secondary | ICD-10-CM

## 2021-02-09 DIAGNOSIS — C7A090 Malignant carcinoid tumor of the bronchus and lung: Secondary | ICD-10-CM

## 2021-02-09 DIAGNOSIS — K5901 Slow transit constipation: Secondary | ICD-10-CM | POA: Diagnosis not present

## 2021-02-09 DIAGNOSIS — E785 Hyperlipidemia, unspecified: Secondary | ICD-10-CM

## 2021-02-09 DIAGNOSIS — N952 Postmenopausal atrophic vaginitis: Secondary | ICD-10-CM

## 2021-02-09 DIAGNOSIS — N1832 Chronic kidney disease, stage 3b: Secondary | ICD-10-CM | POA: Diagnosis not present

## 2021-02-09 DIAGNOSIS — D500 Iron deficiency anemia secondary to blood loss (chronic): Secondary | ICD-10-CM

## 2021-02-09 DIAGNOSIS — I509 Heart failure, unspecified: Secondary | ICD-10-CM | POA: Diagnosis not present

## 2021-02-09 DIAGNOSIS — I482 Chronic atrial fibrillation, unspecified: Secondary | ICD-10-CM

## 2021-02-09 DIAGNOSIS — E871 Hypo-osmolality and hyponatremia: Secondary | ICD-10-CM

## 2021-02-09 DIAGNOSIS — K219 Gastro-esophageal reflux disease without esophagitis: Secondary | ICD-10-CM

## 2021-02-09 MED ORDER — POTASSIUM CHLORIDE CRYS ER 20 MEQ PO TBCR: 20.0000 meq | EXTENDED_RELEASE_TABLET | Freq: Every day | ORAL | 3 refills | Status: DC

## 2021-02-09 NOTE — Assessment & Plan Note (Signed)
GERD takes Pantoprazole

## 2021-02-09 NOTE — Assessment & Plan Note (Signed)
Atrophic vaginitis, takes Estradiol vaginal tab

## 2021-02-09 NOTE — Assessment & Plan Note (Addendum)
stable, Hgb 11.8 01/05/21 , off Fe. Update CBC/diff prior to the next appointment.

## 2021-02-09 NOTE — Assessment & Plan Note (Addendum)
CKD, Bun/creat 28/1.01 eGFR 51 02/02/21. CMP/eGFR prior to the next appointment

## 2021-02-09 NOTE — Assessment & Plan Note (Signed)
Hyponatremia, 137 02/02/21 s/p Nephrology

## 2021-02-09 NOTE — Progress Notes (Signed)
Location:   clinic [place]   Place of Service:  Clinic (12) Provider: Marlana Latus NP  Code Status: DNR Goals of Care: [place] Advanced Directives 02/09/2021  Does Patient Have a Medical Advance Directive? Yes  Type of Paramedic of [place];Living will  Does patient want to make changes to medical advance directive? No - Patient declined  Copy of [place] in Chart? No - copy requested  Would patient like information on creating a medical advance directive? -  Pre-existing out of facility DNR order (yellow form or pink MOST form) -     Chief Complaint  Patient presents with  . Medical Management of Chronic Issues    4 week follow up   . Health Maintenance    Discuss need for tdap vaccine    HPI: Patient is a 87 y.o. female seen today for medical management of chronic diseases.     Hyponatremia, 137 02/02/21 s/p Nephrology HTN, takes Coreg, Rosuvastatin. Bun/creat  28/1.01 02/02/21 Afib, takes Coreg, Eliquis, TSH 1.98 02/02/21 CHF, chronic edema BLE, near none,  takesTorsemide 4x/wk since 01/13/21, EF 60-65%,hospitalized 05/30/20-06/08/20 for acute on CHF, .F/u cardiology 08/29/20, on 1700cc fluid restriction since 01/13/21. Bun/creat 28/1.01 02/02/21  CKD, Bun/creat 28/1.01 eGFR 51 02/02/21 Constipation,takesMiraLax, Bisacodyl.  Atrophic vaginitis, takes Estradiol vaginal tab GERD takes Pantoprazole Anemia, stable, Hgb 11.8 01/05/21 , off Fe  Hyperlipidemia, LDL 52 01/05/21  Hx of carcinoid bronchia adenoma or R lung 5/21 showed stable nodules, f/u Surgeon  Past Medical History:  Diagnosis Date  . Arthritis 1996  . Atrial fibrillation (HCC)    on coumadin  . Atrial tachycardia ([place])   . Cancer [facility]) 1988   breast cancer, R mastectomy, chemo  . 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 Diverticulitis 1999  .  Femur fracture ([place]) 03/2013   left, non weight bearing for 2 1/2 weeks  . GERD (gastroesophageal reflux disease)   . Hemorrhoids 1991  . HTN (hypertension)   . Lymphedema of upper extremity    right arm  . Osteoporosis   . PONV (postoperative nausea and vomiting)   . Urinary, incontinence, stress female   . Vitamin D deficiency disease     Past Surgical History:  Procedure Laterality Date  . APPENDECTOMY    . BREAST BIOPSY  05/04/1987   Right Dr Annamaria Boots  . BREAST IMPLANT REMOVAL Right 06/05/10  . BUNIONECTOMY  1988  . carcinoid Right    Right lower lobectomy 2017  . COLONOSCOPY W/ POLYPECTOMY    . EYE SURGERY  5/14 ; 6/14   Cataract Surgery  . JOINT REPLACEMENT     2002 left; 2009 right Hip, rt knee 2012  . KNEE ARTHROPLASTY Right 2012   [facility]  . LOBECTOMY Right 01/05/2016   Procedure: RIGHT LOWER LOBE LOBECTOMY;  Surgeon: Melrose Nakayama, MD;  Location: [place];  Service: Thoracic;  Laterality: Right;  . MASTECTOMY PARTIAL / LUMPECTOMY W/ AXILLARY LYMPHADENECTOMY  04/05/1987   Right - Dr Annamaria Boots  . RESECTION OF MEDIASTINAL MASS N/A 01/05/2016   Procedure: RESECTION OF PLEURAL MASS, right;  Surgeon: Melrose Nakayama, MD;  Location: [place];  Service: Thoracic;  Laterality: N/A;  . TONSILLECTOMY    . TOTAL HIP ARTHROPLASTY    . TUBAL LIGATION Bilateral 1970  . VAGINAL HYSTERECTOMY  1977   myomata  . VIDEO ASSISTED THORACOSCOPY (VATS)/WEDGE RESECTION Right 01/05/2016   Procedure: RIGHT VIDEO ASSISTED THORACOSCOPY (  VATS)/WEDGE RESECTION;  Surgeon: Melrose Nakayama, MD;  Location: [place];  Service: Thoracic;  Laterality: Right;    Allergies  Allergen Reactions  . Aspirin Rash    Allergies as of 02/09/2021      Reactions   Aspirin Rash      Medication List       Accurate as of February 09, 2021 11:59 PM. If you have any questions, ask your nurse or doctor.        STOP taking these medications   ferrous sulfate 325 (65 FE) MG tablet Stopped by: Dirk Vanaman X Martin Smeal,  NP     TAKE these medications   acetaminophen 500 MG tablet Commonly known as: TYLENOL Take 2 tablets (1,000 mg total) by mouth every 6 (six) hours as needed for mild pain.   apixaban 2.5 MG Tabs tablet Commonly known as: Eliquis Take 1 tablet (2.5 mg total) by mouth 2 (two) times daily.   bisacodyl 5 MG EC tablet Commonly known as: DULCOLAX Take 1 tablet (5 mg total) by mouth daily as needed for severe constipation.   Calcium Carbonate-Vitamin D 600-400 MG-UNIT tablet Take 1 tablet by mouth daily.   carboxymethylcellulose 0.5 % Soln Commonly known as: REFRESH PLUS Place 1 drop into both eyes 2 (two) times daily as needed (for dry eyes).   carvedilol 6.25 MG tablet Commonly known as: COREG TAKE 1 TABLET BY MOUTH TWICE DAILY WITH A MEAL.   Fish Oil 1000 MG Caps Take 1 capsule by mouth 2 (two) times daily.   ketoconazole 2 % cream Commonly known as: NIZORAL SMARTSIG:1 Topical Every Night   lactose free nutrition Liqd Take 237 mLs by mouth 2 (two) times daily between meals.   multivitamin with minerals Tabs tablet Take 1 tablet by mouth every evening.   pantoprazole 40 MG tablet Commonly known as: PROTONIX TAKE 1 TABLET BY MOUTH DAILY.   polyethylene glycol 17 g packet Commonly known as: MIRALAX / GLYCOLAX Take 17 g by mouth daily as needed for moderate constipation.   potassium chloride SA 20 MEQ tablet Commonly known as: KLOR-CON Take 1 tablet (20 mEq total) by mouth daily. What changed: how much to take Changed by: Tashan Kreitzer X Daine Gunther, NP   rosuvastatin 10 MG tablet Commonly known as: CRESTOR Take 1 tablet (10 mg total) by mouth at bedtime.   torsemide 20 MG tablet Commonly known as: DEMADEX TAKE 1 TABLET BY MOUTH DAILY. What changed:   how much to take  when to take this  additional instructions   Vitamin D2 10 MCG (400 UNIT) Tabs Take 400 Units by mouth daily.       Review of Systems:  Review of Systems  Constitutional: Negative for appetite  change, fatigue and fever.  HENT: Positive for hearing loss. Negative for congestion and trouble swallowing.   Eyes: Negative for visual disturbance.  Respiratory: Positive for cough. Negative for shortness of breath and wheezing.        Hx of right lower lobectomy 2017. Chronic cough remains the same. Uses O2 via [place] at night   Cardiovascular: Negative for leg swelling.  Gastrointestinal: Negative for abdominal pain and constipation.  Genitourinary: Positive for frequency. Negative for difficulty urinating, dysuria and urgency.       2-3x/night for years   Musculoskeletal: Positive for arthralgias and gait problem.  Skin: Negative for color change.  Neurological: Negative for speech difficulty, weakness and headaches.  Psychiatric/Behavioral: Negative for behavioral problems and sleep disturbance. The patient is not nervous/anxious.  Health Maintenance  Topic Date Due  . PNA vac Low Risk Adult (2 of 2 - PCV13) 10/23/2015  . COVID-19 Vaccine (3 - Moderna risk 4-dose series) 12/14/2019  . TETANUS/TDAP  05/09/2020  . INFLUENZA VACCINE  05/15/2021  . DEXA SCAN  Completed  . HPV VACCINES  Aged Out    Physical Exam: Vitals:   02/09/21 1533  BP: (!) 116/54  Pulse: 62  Temp: 97.7 F (36.5 C)  TempSrc: Temporal  SpO2: 97%  Weight: 132 lb 12.8 oz (60.2 kg)  Height: '4\' 11"'  (1.499 m)   Body mass index is 26.82 kg/m. Physical Exam Vitals and nursing note reviewed.  Constitutional:      Appearance: Normal appearance.  HENT:     Head: Normocephalic and atraumatic.     Mouth/Throat:     Mouth: Mucous membranes are moist.  Eyes:     Extraocular Movements: Extraocular movements intact.     Conjunctiva/sclera: Conjunctivae normal.     Pupils: Pupils are equal, round, and reactive to light.  Cardiovascular:     Rate and Rhythm: Normal rate. Rhythm irregular.     Heart sounds: No murmur heard.   Pulmonary:     Breath sounds: Rales present. No wheezing.     Comments: s/p right  lung lobectomy 2017. Right lumpectomy 30 years ago. Posterior left basilar rales.  Abdominal:     General: Bowel sounds are normal.     Palpations: Abdomen is soft.     Tenderness: There is no abdominal tenderness. There is no rebound.  Musculoskeletal:     Cervical back: Normal range of motion and neck supple.     Right lower leg: No edema.     Left lower leg: No edema.  Skin:    General: Skin is warm and dry.     Comments: Large bruise lateral left thigh, multiple ecchymoses left forearm  Neurological:     General: No focal deficit present.     Mental Status: She is alert and oriented to person, place, and time. Mental status is at baseline.     Motor: No weakness.     Coordination: Coordination normal.     Gait: Gait abnormal.  Psychiatric:        Mood and Affect: Mood normal.        Behavior: Behavior normal.        Thought Content: Thought content normal.        Judgment: Judgment normal.     Labs reviewed: Basic Metabolic Panel: Recent Labs    06/02/20 0117 06/02/20 0925 06/02/20 1430 06/03/20 0132 06/03/20 1109 06/04/20 0237 06/04/20 0804 06/07/20 1020 06/08/20 0719 06/14/20 0000 08/09/20 0735 01/05/21 0715 02/02/21 0715  NA 119* 119*   < > 118*   < > 117*   < > 130* 129*   < > 139 142 137  K 5.1 4.8  --  4.5   < > 5.1   < > 5.0 4.7   < > 4.6 5.0 4.7  CL 81* 85*  --  84*   < > 84*   < > 91* 87*   < > 100 101 99  CO2 27 25  --  27   < > 18*   < > 29 30   < > '28 31 27  ' GLUCOSE 129* 124*  --  119*   < > 102*   < > 102* 107*  --  94 91 75  BUN 13 15  --  22   < >  40*   < > 27* 28*   < > 28* 40* 28*  CREATININE 0.94 0.99  --  1.14*   < > 1.15*   < > 0.87 1.06*   < > 1.13* 1.37* 1.01*  CALCIUM 8.4* 8.2*  --  8.2*   < > 8.8*   < > 8.9 9.3   < > 9.7 9.5 9.3  MG 1.5*  --   --  2.0  --  2.2  --   --   --   --   --   --   --   PHOS  --   --   --  3.5  --   --   --  4.0 4.2  --   --   --   --   TSH  --  1.293  --   --   --   --   --   --   --   --   --   --  1.98   <  > = values in this interval not displayed.   Liver Function Tests: Recent Labs    06/02/20 0117 06/03/20 0132 06/07/20 1020 06/08/20 0719 06/14/20 0000 08/09/20 0735 01/05/21 0715  AST 32 23  --   --  '27 22 19  ' ALT 17 17  --   --  '22 10 10  ' ALKPHOS 67 59  --   --  67  --   --   BILITOT 1.7* 0.9  --   --   --  1.2 0.9  PROT 5.9* 5.3*  --   --   --  6.7 6.7  ALBUMIN 2.9* 2.5* 2.6* 3.0* 3.2*  --   --    Recent Labs    06/02/20 0117  LIPASE 52*   No results for input(s): AMMONIA in the last 8760 hours. CBC: Recent Labs    06/04/20 1520 06/14/20 0000 06/17/20 0000 08/09/20 0735 01/05/21 0715  WBC 9.5   < > 5.3 2.9* 4.3  NEUTROABS  --    < > 4,367 1,934 3,044  HGB 11.2*   < > 10.0* 11.9 11.8  HCT 33.6*   < > 31* 36.9 37.1  MCV 93.6  --   --  100.5* 100.3*  PLT 219   < > 255 169 153   < > = values in this interval not displayed.   Lipid Panel: Recent Labs    01/05/21 0715  CHOL 119  HDL 50  LDLCALC 52  TRIG 89  CHOLHDL 2.4   No results found for: HGBA1C  Procedures since last visit: No results found.  Assessment/Plan  Acute heart failure (HCC) CHF, chronic edema BLE, takesTorsemide 5x/wk since 01/13/21, EF 60-65%,hospitalized 05/30/20-06/08/20 for acute on CHF, onTorsemide 14m qd..F/u cardiology 08/29/20, on 1700cc fluid restriction since 01/13/21. Bun/creat 28/1.01 02/02/21   CKD (chronic kidney disease) stage 3, GFR 30-59 ml/min (HCC) CKD, Bun/creat 28/1.01 eGFR 51 02/02/21. CMP/eGFR prior to the next appointment  Slow transit constipation Constipation,takesMiraLax, Bisacodyl.   Atrophic vaginitis Atrophic vaginitis, takes Estradiol vaginal tab   GERD (gastroesophageal reflux disease) GERD takes Pantoprazole   IDA (iron deficiency anemia) stable, Hgb 11.8 01/05/21 , off Fe. Update CBC/diff prior to the next appointment.   Hyperlipidemia Hyperlipidemia, LDL 52 01/05/21   Carcinoid bronchial adenoma of right lung ([place] Hx of carcinoid  bronchia adenoma or R lung 5/21 showed stable nodules, f/u Surgeon    Atrial fibrillation, chronic (HCC) Afib, takes Coreg, Eliquis, TSH 1.98 02/02/21  Essential hypertension HTN, takes Coreg, Rosuvastatin. Bun/creat  28/1.01 02/02/21   Hyponatremia Hyponatremia, 137 02/02/21 s/p Nephrology   Labs/tests ordered:  CBC/diff, CMP/eGFR prior to the appointment.   Next appt:  3-4 months

## 2021-02-09 NOTE — Assessment & Plan Note (Signed)
HTN, takes Coreg, Rosuvastatin. Bun/creat  28/1.01 02/02/21

## 2021-02-09 NOTE — Assessment & Plan Note (Signed)
Hyperlipidemia, LDL 52 01/05/21

## 2021-02-09 NOTE — Assessment & Plan Note (Signed)
CHF, chronic edema BLE, takesTorsemide 5x/wk since 01/13/21, EF 60-65%,hospitalized 05/30/20-06/08/20 for acute on CHF, onTorsemide 20mg  qd..F/u cardiology 08/29/20, on 1700cc fluid restriction since 01/13/21. Bun/creat 28/1.01 02/02/21

## 2021-02-09 NOTE — Assessment & Plan Note (Signed)
Constipation,takesMiraLax, Bisacodyl.

## 2021-02-09 NOTE — Assessment & Plan Note (Signed)
Afib, takes Coreg, Eliquis, TSH 1.98 02/02/21

## 2021-02-09 NOTE — Assessment & Plan Note (Signed)
Hx of carcinoid bronchia adenoma or R lung 5/21 showed stable nodules, f/u Surgeon

## 2021-02-10 ENCOUNTER — Encounter: Payer: Self-pay | Admitting: Nurse Practitioner

## 2021-02-24 ENCOUNTER — Ambulatory Visit
Admission: RE | Admit: 2021-02-24 | Discharge: 2021-02-24 | Disposition: A | Discharge status: Home / Self Care | Payer: Medicare Other | Source: Ambulatory Visit | Attending: Thoracic Surgery (Cardiothoracic Vascular Surgery) | Admitting: Thoracic Surgery (Cardiothoracic Vascular Surgery)

## 2021-02-24 ENCOUNTER — Other Ambulatory Visit: Payer: Self-pay

## 2021-02-24 DIAGNOSIS — C7A090 Malignant carcinoid tumor of the bronchus and lung: Secondary | ICD-10-CM

## 2021-02-28 ENCOUNTER — Encounter: Payer: Self-pay | Admitting: Thoracic Surgery (Cardiothoracic Vascular Surgery)

## 2021-02-28 ENCOUNTER — Ambulatory Visit (INDEPENDENT_AMBULATORY_CARE_PROVIDER_SITE_OTHER): Payer: Medicare Other | Admitting: Thoracic Surgery (Cardiothoracic Vascular Surgery)

## 2021-02-28 ENCOUNTER — Other Ambulatory Visit: Payer: Self-pay

## 2021-02-28 VITALS — BP 127/75 | HR 87 | Resp 20 | Ht 59.0 in | Wt 128.0 lb

## 2021-02-28 DIAGNOSIS — R918 Other nonspecific abnormal finding of lung field: Secondary | ICD-10-CM

## 2021-02-28 DIAGNOSIS — Z902 Acquired absence of lung [part of]: Secondary | ICD-10-CM | POA: Diagnosis not present

## 2021-02-28 NOTE — Progress Notes (Signed)
[address]       [place] [address]             [phone]       HPI: Mrs. Bye returns for a scheduled follow-up visit regarding lung nodules  [name] is an 87 year old woman with a history of hypertension, reflux, right breast cancer, chronic atrial fibrillation, vitamin D deficiency, osteoporosis, thoracic aortic atherosclerosis, carcinoid tumor of the lung, and urosepsis.  She had a right lower lobectomy for stage Ia carcinoid tumor in 2017.  She has been followed since that time with waxing and waning pulmonary nodules.  I last saw her in May 2021.  She was doing well at that time with no evidence of recurrent disease.  In the interim since her last visit she had an episode of urosepsis and was hospitalized in August.  She is been feeling well recently.  She denied having any respiratory problems she does not have any residual pain related to her surgery.  Past Medical History:  Diagnosis Date  . Arthritis 1996  . Atrial fibrillation (HCC)    on coumadin  . Atrial tachycardia ([place])   . Cancer [facility]) 1988   breast cancer, R mastectomy, chemo  . 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 Diverticulitis 1999  . Femur fracture ([place]) 03/2013   left, non weight bearing for 2 1/2 weeks  . GERD (gastroesophageal reflux disease)   . Hemorrhoids 1991  . HTN (hypertension)   . Lymphedema of upper extremity    right arm  . Osteoporosis   . PONV (postoperative nausea and vomiting)   . Urinary, incontinence, stress female   . Vitamin D deficiency disease      Current Outpatient Medications  Medication Sig Dispense Refill  . acetaminophen (TYLENOL) 500 MG tablet Take 2 tablets (1,000 mg total) by mouth every 6 (six) hours as needed for mild pain. 30 tablet 0  . apixaban (ELIQUIS) 2.5 MG TABS tablet Take 1 tablet (2.5 mg total) by mouth 2 (two) times daily. 60 tablet 11  . bisacodyl (DULCOLAX) 5 MG EC tablet  Take 1 tablet (5 mg total) by mouth daily as needed for severe constipation.    . Calcium Carbonate-Vitamin D 600-400 MG-UNIT tablet Take 1 tablet by mouth daily.    . carboxymethylcellulose (REFRESH PLUS) 0.5 % SOLN Place 1 drop into both eyes 2 (two) times daily as needed (for dry eyes).    . carvedilol (COREG) 6.25 MG tablet TAKE 1 TABLET BY MOUTH TWICE DAILY WITH A MEAL. 60 tablet 3  . Ergocalciferol (VITAMIN D2) 10 MCG (400 UNIT) TABS Take 400 Units by mouth daily. 30 tablet 0  . ketoconazole (NIZORAL) 2 % cream SMARTSIG:1 Topical Every Night    . lactose free nutrition (BOOST) LIQD Take 237 mLs by mouth 2 (two) times daily between meals.    . Multiple Vitamin (MULTIVITAMIN WITH MINERALS) TABS Take 1 tablet by mouth every evening.    . Omega-3 Fatty Acids (FISH OIL) 1000 MG CAPS Take 1 capsule by mouth 2 (two) times daily.    . pantoprazole (PROTONIX) 40 MG tablet TAKE 1 TABLET BY MOUTH DAILY. 30 tablet 5  . polyethylene glycol (MIRALAX / GLYCOLAX) 17 g packet Take 17 g by mouth daily as needed for moderate constipation. 14 each 2  . potassium chloride SA (KLOR-CON) 20 MEQ tablet Take 1 tablet (20 mEq total) by mouth daily. 30 tablet  3  . rosuvastatin (CRESTOR) 10 MG tablet Take 1 tablet (10 mg total) by mouth at bedtime. 90 tablet 3  . torsemide (DEMADEX) 20 MG tablet TAKE 1 TABLET BY MOUTH DAILY. (Patient taking differently: Take 1 mg by mouth as directed. Take 4 times a week m,t,th,f) 30 tablet 5   No current facility-administered medications for this visit.    Physical Exam BP 127/75 (BP Location: Left Arm, Patient Position: Sitting)   Pulse 87   Resp 20   Ht 4\' 11"  (1.499 m)   Wt 128 lb (58.1 kg)   LMP 10/16/1975 (Approximate)   SpO2 91% Comment: RA  BMI 25.27 kg/m  87 year old woman in no acute distress Alert and oriented x3 with no focal deficits Lungs clear bilaterally Cardiac irregularly irregular rate and rhythm  Diagnostic Tests: CT CHEST WITHOUT  CONTRAST  TECHNIQUE: Multidetector CT imaging of the chest was performed following the standard protocol without IV contrast.  COMPARISON:  03/03/2020  FINDINGS: Cardiovascular: Aortic atherosclerosis. Tortuous thoracic aorta. Moderate cardiomegaly. Three vessel coronary artery calcification.  Mediastinum/Nodes: No supraclavicular adenopathy. Right axillary node dissection. No axillary adenopathy. No mediastinal or definite hilar adenopathy, given limitations of unenhanced CT.  Lungs/Pleura: Trace right-sided pleural fluid with areas of anterior and posteromedial loculation, decreased.  Moderate centrilobular emphysema.  Right lower lobectomy.  Bilateral pulmonary nodules are similar. Example in the right upper lobe at 6 mm on 30/8, left upper lobe at 4 mm on 44/8, posterior right upper lobe at 3 mm on 47/8. Other smaller nodules are identified on series 8 and not significantly changed.  Areas of mucoid impaction within the central right upper lobe on 41/8 and lateral right lung base on 98/8. There is a new branching opacity, consistent with mucoid impaction within the more anterior right lung base, including on 102-105/8.  Upper Abdomen: Well-circumscribed low-density liver lesions are similar, likely cysts. Normal imaged portions of the spleen, stomach, right adrenal gland, left kidney. Subcentimeter low-density upper pole right renal lesion is likely a cyst. Left adrenal thickening with maintenance of adreniform shape.  Musculoskeletal: Right mastectomy. Osteopenia. Accentuation of expected thoracic kyphosis. Mild superior endplate compression deformity at T12 is not significantly changed. Moderate convex left lumbar spine curvature.  IMPRESSION: 1. Status post right lower lobectomy. 2. Decrease in trace loculated right-sided pleural effusion. 3. Primarily similar bilateral pulmonary nodules, favoring a benign etiology. Areas of right-sided mucoid  impaction, including a new site within the right lung base. 4. No thoracic adenopathy or findings of metastatic disease. 5. Aortic atherosclerosis (ICD10-I70.0), coronary artery atherosclerosis and emphysema (ICD10-J43.9).   Electronically Signed   By: Abigail Miyamoto M.D.   On: 02/24/2021 14:49 I personally reviewed her chest x-ray images.  She has multiple small nodules none of which are suspicious.  Impression: Nhyira Leano is an 87 year old woman with a history of hypertension, reflux, right breast cancer, chronic atrial fibrillation, vitamin D deficiency, osteoporosis, thoracic aortic atherosclerosis, carcinoid tumor of the lung, and urosepsis.   Stage Ia carcinoid tumor of the lung-she is now 5 years post resection with no evidence of recurrence.  She has multiple small stable lung nodules.  I do not think there is any need for continued follow-up CTs particularly given her age.  Thoracic aortic and coronary atherosclerosis by CT-on Crestor.  Asymptomatic.  Atrial fibrillation-rate controlled, on anticoagulation  Plan: Follow-up with Dr. Lyndel Safe I will be happy to see Mrs. Sturgill back in the future if I can be of any further assistance with her care  Melrose Nakayama, MD [facility] [phone]

## 2021-03-14 ENCOUNTER — Other Ambulatory Visit: Payer: Self-pay | Admitting: Internal Medicine

## 2021-03-20 ENCOUNTER — Other Ambulatory Visit: Payer: Self-pay | Admitting: Internal Medicine

## 2021-04-23 ENCOUNTER — Other Ambulatory Visit: Payer: Self-pay | Admitting: Nurse Practitioner

## 2021-06-01 ENCOUNTER — Other Ambulatory Visit: Payer: Self-pay

## 2021-06-01 DIAGNOSIS — E871 Hypo-osmolality and hyponatremia: Secondary | ICD-10-CM

## 2021-06-01 DIAGNOSIS — D500 Iron deficiency anemia secondary to blood loss (chronic): Secondary | ICD-10-CM

## 2021-06-01 LAB — COMPLETE METABOLIC PANEL WITH GFR
AG Ratio: 1.6 (calc) (ref 1.0–2.5)
ALT: 10 U/L (ref 6–29)
AST: 17 U/L (ref 10–35)
Albumin: 4.3 g/dL (ref 3.6–5.1)
Alkaline phosphatase (APISO): 65 U/L (ref 37–153)
BUN/Creatinine Ratio: 26 (calc) — ABNORMAL HIGH (ref 6–22)
BUN: 33 mg/dL — ABNORMAL HIGH (ref 7–25)
CO2: 29 mmol/L (ref 20–32)
Calcium: 9.6 mg/dL (ref 8.6–10.4)
Chloride: 105 mmol/L (ref 98–110)
Creat: 1.25 mg/dL — ABNORMAL HIGH (ref 0.60–0.95)
Globulin: 2.7 g/dL (calc) (ref 1.9–3.7)
Glucose, Bld: 81 mg/dL (ref 65–99)
Potassium: 4.6 mmol/L (ref 3.5–5.3)
Sodium: 142 mmol/L (ref 135–146)
Total Bilirubin: 1.1 mg/dL (ref 0.2–1.2)
Total Protein: 7 g/dL (ref 6.1–8.1)
eGFR: 43 mL/min/{1.73_m2} — ABNORMAL LOW (ref >=60)

## 2021-06-01 LAB — CBC WITH DIFFERENTIAL/PLATELET
Absolute Monocytes: 398 cells/uL (ref 200–950)
Basophils Absolute: 20 cells/uL (ref 0–200)
Basophils Relative: 0.6 %
Eosinophils Absolute: 20 cells/uL (ref 15–500)
Eosinophils Relative: 0.6 %
HCT: 36.9 % (ref 35.0–45.0)
Hemoglobin: 11.8 g/dL (ref 11.7–15.5)
Lymphs Abs: 993 cells/uL (ref 850–3900)
MCH: 31.5 pg (ref 27.0–33.0)
MCHC: 32 g/dL (ref 32.0–36.0)
MCV: 98.4 fL (ref 80.0–100.0)
MPV: 9.7 fL (ref 7.5–12.5)
Monocytes Relative: 11.7 %
Neutro Abs: 1969 cells/uL (ref 1500–7800)
Neutrophils Relative %: 57.9 %
Platelets: 145 10*3/uL (ref 140–400)
RBC: 3.75 10*6/uL — ABNORMAL LOW (ref 3.80–5.10)
RDW: 12.5 % (ref 11.0–15.0)
Total Lymphocyte: 29.2 %
WBC: 3.4 10*3/uL — ABNORMAL LOW (ref 3.8–10.8)

## 2021-06-08 ENCOUNTER — Non-Acute Institutional Stay: Payer: Medicare Other | Admitting: Nurse Practitioner

## 2021-06-08 ENCOUNTER — Encounter: Payer: Self-pay | Admitting: Nurse Practitioner

## 2021-06-08 ENCOUNTER — Other Ambulatory Visit: Payer: Self-pay

## 2021-06-08 DIAGNOSIS — R911 Solitary pulmonary nodule: Secondary | ICD-10-CM | POA: Diagnosis not present

## 2021-06-08 DIAGNOSIS — K5901 Slow transit constipation: Secondary | ICD-10-CM

## 2021-06-08 DIAGNOSIS — N1832 Chronic kidney disease, stage 3b: Secondary | ICD-10-CM

## 2021-06-08 DIAGNOSIS — D500 Iron deficiency anemia secondary to blood loss (chronic): Secondary | ICD-10-CM | POA: Diagnosis not present

## 2021-06-08 DIAGNOSIS — N952 Postmenopausal atrophic vaginitis: Secondary | ICD-10-CM

## 2021-06-08 DIAGNOSIS — I482 Chronic atrial fibrillation, unspecified: Secondary | ICD-10-CM

## 2021-06-08 DIAGNOSIS — K219 Gastro-esophageal reflux disease without esophagitis: Secondary | ICD-10-CM

## 2021-06-08 DIAGNOSIS — E785 Hyperlipidemia, unspecified: Secondary | ICD-10-CM | POA: Diagnosis not present

## 2021-06-08 DIAGNOSIS — I5032 Chronic diastolic (congestive) heart failure: Secondary | ICD-10-CM

## 2021-06-08 DIAGNOSIS — E871 Hypo-osmolality and hyponatremia: Secondary | ICD-10-CM

## 2021-06-08 DIAGNOSIS — I10 Essential (primary) hypertension: Secondary | ICD-10-CM

## 2021-06-08 NOTE — Progress Notes (Signed)
Location:   clinic [place]   Place of Service:  Clinic (12) Provider: Marlana Latus NP  Code Status: DNR Goals of Care: [place] 06/08/2021  Does Patient Have a Medical Advance Directive? Yes  Type of Paramedic of [place];Living will  Does patient want to make changes to medical advance directive? No - Patient declined  Copy of [place] in Chart? Yes - validated most recent copy scanned in chart (See row information)  Would patient like information on creating a medical advance directive? -  Pre-existing out of facility DNR order (yellow form or pink MOST form) -     Chief Complaint  Patient presents with   Medical Management of Chronic Issues    4 month follow up. Discuss labs   Health Maintenance    Discuss need for shingles vaccine, PNA vaccine, td/tdap vaccine, and influenza vaccine.     HPI: Patient is a 87 y.o. female seen today for medical management of chronic diseases.    Hyponatremia, 142 05/31/21, s/p Nephrology             HTN, takes  Coreg, Rosuvastatin. Bun/creat  33/1.25 05/31/21             Afib, takes Coreg, Eliquis, TSH 1.98 02/02/21             CHF, chronic edema BLE, near none,  takes Torsemide 4x/wk since 01/13/21, EF 60-65%, hospitalized 05/30/20-06/08/20 for acute on CHF,  . F/u cardiology 08/29/20, on 2045m/day fluid restriction. Bun/creat 33/1.25 05/31/21             CKD, Bun/creat 33/1.25 05/31/21             Constipation, takes MiraLax, Bisacodyl.              Atrophic vaginitis, takes Estradiol vaginal tab             GERD takes Pantoprazole             Anemia, stable, Hgb 11.8 05/31/21,  off Fe             Hyperlipidemia, LDL 52 01/05/21, takes Rosuvastatin.              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 Small table lung nodules, no need for continued f/u CT scan.    Past Medical History:  Diagnosis  Date   Arthritis 1996   [place]    [place] cou[place]   [place]    [place]   breast cancer, R mastectomy, chemo   [place]   Right lower lobectomy   Diverticulitis 1999   [place]em[place] ([place] 03[place]   left, non weight bearing for 2 1/2 weeks   GERD (gastroesophageal reflux disease)    Hemorrhoids 1991   HTN (hypertension)    Lymphedema of upper extremity    right arm   Osteoporosis    PONV (postoperative nausea and vomiting)    Urinary, incontinence, stress female    Vitamin D deficiency disease     Past Surgical History:  Procedure Laterality Date   APPENDECTOMY     BREAST BIOPSY  05/04/1987   Right Dr YAnnamaria Boots  BREAST IMPLANT REMOVAL Right 06/05/10   BUNIONECTOMY  1988   carcinoid Right    Right lower lobectomy 2017   COLONOSCOPY W/  POLYPECTOMY     EYE SURGERY  5/14 ; 6/14   Cataract Surgery   JOINT REPLACEMENT     2002 left; 2009 right Hip, rt knee 2012   KNEE ARTHROPLASTY Right 2012   [place]   LOBECTOMY Right 01/05/2016   Procedure: RIGHT LOWER LOBE LOBECTOMY;  Surgeon: Melrose Nakayama, MD;  Location: [place];  Service: Thoracic;  Laterality: Right;   MASTECTOMY PARTIAL / LUMPECTOMY W/ AXILLARY LYMPHADENECTOMY  04/05/1987   Right - Dr Annamaria Boots   RESECTION OF MEDIASTINAL MASS N/A 01/05/2016   Procedure: RESECTION OF PLEURAL MASS, right;  Surgeon: Melrose Nakayama, MD;  Location: [facility];  Service: Thoracic;  Laterality: N/A;   TONSILLECTOMY     TOTAL HIP ARTHROPLASTY     TUBAL LIGATION Bilateral 1970   [place]   myomata   VIDEO ASSISTED THORACOSCOPY (VATS)/WEDGE RESECTION Right 01/05/2016   Procedure: RIGHT VIDEO ASSISTED THORACOSCOPY (VATS)/WEDGE RESECTION;  Surgeon: Melrose Nakayama, MD;  Location: [facility];  Service: Thoracic;  Laterality: Right;    Allergies  Allergen Reactions   Aspirin Rash    Allergies as of 06/08/2021       Reactions    Aspirin Rash        Medication List        Accurate as of June 08, 2021 11:59 PM. If you have any questions, ask your nurse or doctor.          acetaminophen 500 MG tablet Commonly known as: TYLENOL Take 2 tablets (1,000 mg total) by mouth every 6 (six) hours as needed for mild pain.   apixaban 2.5 MG Tabs tablet Commonly known as: Eliquis Take 1 tablet (2.5 mg total) by mouth 2 (two) times daily.   bisacodyl 5 MG EC tablet Commonly known as: DULCOLAX Take 1 tablet (5 mg total) by mouth daily as needed for severe constipation.   Calcium Carbonate-Vitamin D 600-400 MG-UNIT tablet Take 1 tablet by mouth daily.   carboxymethylcellulose 0.5 % Soln Commonly known as: REFRESH PLUS Place 1 drop into both eyes 2 (two) times daily as needed (for dry eyes).   carvedilol 6.25 MG tablet Commonly known as: COREG TAKE ONE TABLET TWICE DAILY AFTER MEALS   Fish Oil 1000 MG Caps Take 1 capsule by mouth 2 (two) times daily.   ketoconazole 2 % cream Commonly known as: NIZORAL SMARTSIG:1 Topical Every Night   lactose free nutrition Liqd Take 237 mLs by mouth 2 (two) times daily between meals.   multivitamin with minerals Tabs tablet Take 1 tablet by mouth every evening.   pantoprazole 40 MG tablet Commonly known as: PROTONIX TAKE 1 TABLET BY MOUTH DAILY.   polyethylene glycol 17 g packet Commonly known as: MIRALAX / GLYCOLAX Take 17 g by mouth daily as needed for moderate constipation.   potassium chloride SA 20 MEQ tablet Commonly known as: KLOR-CON Take 1 tablet (20 mEq total) by mouth daily.   rosuvastatin 10 MG tablet Commonly known as: CRESTOR Take 1 tablet (10 mg total) by mouth at bedtime.   torsemide 20 MG tablet Commonly known as: DEMADEX TAKE 1 TABLET BY MOUTH DAILY.   Vitamin D2 10 MCG (400 UNIT) Tabs Take 400 Units by mouth daily.        Review of Systems:  Review of Systems  Constitutional:  Negative for appetite change, fatigue and fever.   HENT:  Positive for hearing loss. Negative for congestion and trouble swallowing.   Eyes:  Negative for visual  disturbance.  Respiratory:  Positive for cough. Negative for shortness of breath and wheezing.        Hx of right lower lobectomy 2017. Chronic cough remains the same. Uses O2 via [place] at night   Cardiovascular:  Negative for leg swelling.  Gastrointestinal:  Negative for abdominal distention, abdominal pain, constipation, diarrhea, nausea and vomiting.       Had a few diarrhea after meal 3 days ago, resolved w/o intervention, oral intake is at her baseline.   Genitourinary:  Positive for frequency. Negative for dysuria and urgency.       2-3x/night for years   Musculoskeletal:  Positive for arthralgias and gait problem.  Skin:  Negative for color change.  Neurological:  Negative for speech difficulty, weakness and headaches.  Psychiatric/Behavioral:  Negative for behavioral problems and sleep disturbance. The patient is not nervous/anxious.    Health Maintenance  Topic Date Due   Zoster Vaccines- Shingrix (1 of 2) Never done   PNA vac Low Risk Adult (2 of 2 - PCV13) 10/23/2015   COVID-19 Vaccine (3 - Moderna risk series) 12/14/2019   TETANUS/TDAP  05/09/2020   INFLUENZA VACCINE  05/15/2021   DEXA SCAN  Completed   HPV VACCINES  Aged Out    Physical Exam: Vitals:   06/08/21 1500  BP: 120/62  Pulse: 72  Resp: 16  Temp: (!) 97.1 F (36.2 C)  SpO2: 95%  Weight: 129 lb 9.6 oz (58.8 kg)  Height: _0  (1.499 m)   Body mass index is 26.18 kg/m. Physical Exam Vitals and nursing note reviewed.  Constitutional:      Appearance: Normal appearance.  HENT:     Head: Normocephalic and atraumatic.     Mouth/Throat:     Mouth: Mucous membranes are moist.  Eyes:     Extraocular Movements: Extraocular movements intact.     Conjunctiva/sclera: Conjunctivae normal.     Pupils: Pupils are equal, round, and reactive to light.  Cardiovascular:     Rate and Rhythm: Normal rate.  Rhythm irregular.     Heart sounds: No murmur heard. Pulmonary:     Breath sounds: Rales present. No wheezing.     Comments: s/p right lung lobectomy 2017. Right lumpectomy 30 years ago. Posterior left basilar rales.  Abdominal:     General: Bowel sounds are normal. There is no distension.     Palpations: Abdomen is soft.     Tenderness: There is no abdominal tenderness. There is no right CVA tenderness, left CVA tenderness, guarding or rebound.  Musculoskeletal:     Cervical back: Normal range of motion and neck supple.     Right lower leg: No edema.     Left lower leg: No edema.  Skin:    General: Skin is warm and dry.  Neurological:     General: No focal deficit present.     Mental Status: She is alert and oriented to person, place, and time. Mental status is at baseline.     Motor: No weakness.     Coordination: Coordination normal.     Gait: Gait abnormal.  Psychiatric:        Mood and Affect: Mood normal.        Behavior: Behavior normal.        Thought Content: Thought content normal.        Judgment: Judgment normal.    Labs reviewed: Basic Metabolic Panel: Recent Labs    01/05/21 0715 02/02/21 0715 05/31/21 0844  NA 142 137  142  K 5.0 4.7 4.6  CL 101 99 105  CO2 _0 GLUCOSE 91 75 81  BUN 40* 28* 33*  CREATININE 1.37* 1.01* 1.25*  CALCIUM 9.5 9.3 9.6  TSH  --  1.98  --    Liver Function Tests: Recent Labs    06/14/20 0000 08/09/20 0735 01/05/21 0715 05/31/21 0844  AST _1 ALT _2 ALKPHOS 67  --   --   --   BILITOT  --  1.2 0.9 1.1  PROT  --  6.7 6.7 7.0  ALBUMIN 3.2*  --   --   --    No results for input(s): LIPASE, AMYLASE in the last 8760 hours. No results for input(s): AMMONIA in the last 8760 hours. CBC: Recent Labs    08/09/20 0735 01/05/21 0715 05/31/21 0844  WBC 2.9* 4.3 3.4*  NEUTROABS 1,934 3,044 1,969  HGB 11.9 11.8 11.8  HCT 36.9 37.1 36.9  MCV 100.5* 100.3* 98.4  PLT 169 153 145   Lipid  Panel: Recent Labs    01/05/21 0715  CHOL 119  HDL 50  LDLCALC 52  TRIG 89  CHOLHDL 2.4   No results found for: HGBA1C  Procedures since last visit: No results found.  Assessment/Plan  IDA (iron deficiency anemia) stable, Hgb 11.8 05/31/21,  off Fe  Hyperlipidemia  LDL 52 01/05/21, takes Rosuvastatin.   Lung nodule Hx of carcinoid bronchia adenoma or R lung 5/21 showed stable nodules, f/u Surgeon prn, 5 years post resection, no evidence of recurrence. Small table lung nodules, no need for continued f/u CT scan.   GERD (gastroesophageal reflux disease) takes Pantoprazole  Atrophic vaginitis takes Estradiol vaginal tab  Slow transit constipation Stable, takes MiraLax, Bisacodyl.   CKD (chronic kidney disease) stage 3, GFR 30-59 ml/min (HCC) Bun/creat 33/1.25 05/31/21  Congestive heart failure (CHF) (HCC) chronic edema BLE, near none,  takes Torsemide 4x/wk since 01/13/21, EF 60-65%, hospitalized 05/30/20-06/08/20 for acute on CHF,  . F/u cardiology 08/29/20, on 2000cc fluid restriction. Bun/creat 33/1.25 05/31/21  Atrial fibrillation, chronic (HCC) Heart rate is in control, takes Coreg, Eliquis, TSH 1.98 02/02/21  Essential hypertension Blood pressure is controlled, takes  Coreg, Rosuvastatin. Bun/creat  33/1.25 05/31/21  Hyponatremia Resolved, underwent nephrology evaluation, fluid restriction 1700cc/day.    Labs/tests ordered:  CBC/diff, CMP/eGFR  Next appt: 6 months.

## 2021-06-08 NOTE — Assessment & Plan Note (Signed)
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 Small table lung nodules, no need for continued f/u CT scan.

## 2021-06-08 NOTE — Assessment & Plan Note (Signed)
Stable, takes MiraLax, Bisacodyl.

## 2021-06-08 NOTE — Assessment & Plan Note (Signed)
takes Estradiol vaginal tab

## 2021-06-08 NOTE — Assessment & Plan Note (Signed)
Resolved, underwent nephrology evaluation, fluid restriction 1700cc/day.

## 2021-06-08 NOTE — Assessment & Plan Note (Signed)
stable, Hgb 11.8 05/31/21,  off Fe

## 2021-06-08 NOTE — Assessment & Plan Note (Signed)
takes Pantoprazole

## 2021-06-08 NOTE — Assessment & Plan Note (Signed)
Heart rate is in control, takes Coreg, Eliquis, TSH 1.98 02/02/21

## 2021-06-08 NOTE — Assessment & Plan Note (Signed)
Blood pressure is controlled, takes  Coreg, Rosuvastatin. Bun/creat  33/1.25 05/31/21

## 2021-06-08 NOTE — Assessment & Plan Note (Signed)
LDL 52 01/05/21, takes Rosuvastatin.

## 2021-06-08 NOTE — Assessment & Plan Note (Signed)
Bun/creat 33/1.25 05/31/21

## 2021-06-08 NOTE — Assessment & Plan Note (Addendum)
chronic edema BLE, near none,  takes Torsemide 4x/wk since 01/13/21, EF 60-65%, hospitalized 05/30/20-06/08/20 for acute on CHF,  . F/u cardiology 08/29/20, on 2000cc fluid restriction. Bun/creat 33/1.25 05/31/21

## 2021-06-09 ENCOUNTER — Encounter: Payer: Self-pay | Admitting: Nurse Practitioner

## 2021-07-18 ENCOUNTER — Other Ambulatory Visit: Payer: Self-pay | Admitting: Internal Medicine

## 2021-08-02 ENCOUNTER — Encounter: Payer: Self-pay | Admitting: Family

## 2021-08-02 ENCOUNTER — Other Ambulatory Visit: Payer: Self-pay

## 2021-08-02 ENCOUNTER — Ambulatory Visit (INDEPENDENT_AMBULATORY_CARE_PROVIDER_SITE_OTHER): Payer: Medicare Other | Admitting: Family

## 2021-08-02 VITALS — BP 112/76 | HR 92 | Temp 97.8°F | Ht 59.0 in | Wt 131.0 lb

## 2021-08-02 DIAGNOSIS — R300 Dysuria: Secondary | ICD-10-CM | POA: Diagnosis not present

## 2021-08-02 DIAGNOSIS — N3001 Acute cystitis with hematuria: Secondary | ICD-10-CM | POA: Diagnosis not present

## 2021-08-02 LAB — POCT URINALYSIS DIPSTICK
Bilirubin, UA: NEGATIVE
Glucose, UA: NEGATIVE
Ketones, UA: NEGATIVE
Nitrite, UA: NEGATIVE
Protein, UA: NEGATIVE
Spec Grav, UA: 1.01 (ref 1.010–1.025)
Urobilinogen, UA: 0.2 E.U./dL
pH, UA: 6 (ref 5.0–8.0)

## 2021-08-02 MED ORDER — CIPROFLOXACIN HCL 500 MG PO TABS: 500.0000 mg | ORAL_TABLET | Freq: Two times a day (BID) | ORAL | 0 refills | Status: AC

## 2021-08-02 NOTE — Patient Instructions (Signed)
Urinary Tract Infection, Adult A urinary tract infection (UTI) is an infection of any part of the urinary tract. The urinary tract includes the kidneys, ureters, bladder, and urethra. These organs make, store, and get rid of urine in the body. An upper UTI affects the ureters and kidneys. A lower UTI affects the bladder and urethra. What are the causes? Most urinary tract infections are caused by bacteria in your genital area around your urethra, where urine leaves your body. These bacteria grow and cause inflammation of your urinary tract. What increases the risk? You are more likely to develop this condition if: You have a urinary catheter that stays in place. You are not able to control when you urinate or have a bowel movement (incontinence). You are female and you: Use a spermicide or diaphragm for birth control. Have low estrogen levels. Are pregnant. You have certain genes that increase your risk. You are sexually active. You take antibiotic medicines. You have a condition that causes your flow of urine to slow down, such as: An enlarged prostate, if you are female. Blockage in your urethra. A kidney stone. A nerve condition that affects your bladder control (neurogenic bladder). Not getting enough to drink, or not urinating often. You have certain medical conditions, such as: Diabetes. A weak disease-fighting system (immunesystem). Sickle cell disease. Gout. Spinal cord injury. What are the signs or symptoms? Symptoms of this condition include: Needing to urinate right away (urgency). Frequent urination. This may include small amounts of urine each time you urinate. Pain or burning with urination. Blood in the urine. Urine that smells bad or unusual. Trouble urinating. Cloudy urine. Vaginal discharge, if you are female. Pain in the abdomen or the lower back. You may also have: Vomiting or a decreased appetite. Confusion. Irritability or tiredness. A fever or  chills. Diarrhea. The first symptom in older adults may be confusion. In some cases, they may not have any symptoms until the infection has worsened. How is this diagnosed? This condition is diagnosed based on your medical history and a physical exam. You may also have other tests, including: Urine tests. Blood tests. Tests for STIs (sexually transmitted infections). If you have had more than one UTI, a cystoscopy or imaging studies may be done to determine the cause of the infections. How is this treated? Treatment for this condition includes: Antibiotic medicine. Over-the-counter medicines to treat discomfort. Drinking enough water to stay hydrated. If you have frequent infections or have other conditions such as a kidney stone, you may need to see a health care provider who specializes in the urinary tract (urologist). In rare cases, urinary tract infections can cause sepsis. Sepsis is a life-threatening condition that occurs when the body responds to an infection. Sepsis is treated in the hospital with IV antibiotics, fluids, and other medicines. Follow these instructions at home: Medicines Take over-the-counter and prescription medicines only as told by your health care provider. If you were prescribed an antibiotic medicine, take it as told by your health care provider. Do not stop using the antibiotic even if you start to feel better. General instructions Make sure you: Empty your bladder often and completely. Do not hold urine for long periods of time. Empty your bladder after sex. Wipe from front to back after urinating or having a bowel movement if you are female. Use each tissue only one time when you wipe. Drink enough fluid to keep your urine pale yellow. Keep all follow-up visits. This is important. Contact a health care provider   if: Your symptoms do not get better after 1-2 days. Your symptoms go away and then return. Get help right away if: You have severe pain in your  back or your lower abdomen. You have a fever or chills. You have nausea or vomiting. Summary A urinary tract infection (UTI) is an infection of any part of the urinary tract, which includes the kidneys, ureters, bladder, and urethra. Most urinary tract infections are caused by bacteria in your genital area. Treatment for this condition often includes antibiotic medicines. If you were prescribed an antibiotic medicine, take it as told by your health care provider. Do not stop using the antibiotic even if you start to feel better. Keep all follow-up visits. This is important. This information is not intended to replace advice given to you by your health care provider. Make sure you discuss any questions you have with your health care provider. Document Revised: 05/13/2020 Document Reviewed: 05/13/2020 Elsevier Patient Education  2022 Elsevier Inc.  

## 2021-08-02 NOTE — Progress Notes (Signed)
Provider: Aerilyn Slee FNP-C  Virgie Dad, MD  Patient Care Team: Virgie Dad, MD as PCP - General (Internal Medicine) O'Neal, Cassie Freer, MD as PCP - Cardiology (Cardiology) Megan Salon, MD (Gynecology) Eston Esters, MD (Inactive) (Hematology and Oncology) Laurence Spates, MD (Inactive) (Gastroenterology) Mast, Man X, NP as Nurse Practitioner (Internal Medicine)  Extended Emergency Contact Information Primary Emergency Contact: Cosma,[name] Address: [address]          [place], [place] [address] [name] of [place] Phone: [phone] Relation: Son Secondary Emergency Contact: [name],[name] Address: [address]          [place], [place] [address] [name] of [place] Phone: [phone] Mobile Phone: [phone] Relation: Daughter  Code Status: Full Code  Goals of care: Advanced Directive information Advanced Directives 06/08/2021  Does Patient Have a Medical Advance Directive? Yes  Type of Paramedic of [place];Living will  Does patient want to make changes to medical advance directive? No - Patient declined  Copy of [address] in Chart? Yes - validated most recent copy scanned in chart (See row information)  Would patient like information on creating a medical advance directive? -  Pre-existing out of facility DNR order (yellow form or pink MOST form) -     Chief Complaint  Patient presents with   Acute Visit    Patient c/o blood in urine and pain when urinating. Symptoms onset yesterday. Patient is concerned in light of the fact that she is on a blood thinner.     HPI:  Pt is a 87 y.o. female seen today for an acute visit for evaluation of blood in the urine since yesterday afternoon but none today.No blood today.Has burning when voiding. Denies any fever,chills,N/V abdominal pain or worsening back pain.On fluid restriction due to low sodium. Left arm rash after flu shot on  Monday.just felt sore but has improved.never had any reaction to flu shot in the past.   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   Right lower lobectomy   Diverticulitis 1999   Femur fracture ([facility]) 03/2013   left, non weight bearing for 2 1/2 weeks   GERD (gastroesophageal reflux disease)    Hemorrhoids 1991   HTN (hypertension)    Lymphedema of upper extremity    right arm   Osteoporosis    PONV (postoperative nausea and vomiting)    Urinary, incontinence, stress female    Vitamin D deficiency disease    Past Surgical History:  Procedure Laterality Date   APPENDECTOMY     BREAST BIOPSY  05/04/1987   Right Dr Annamaria Boots   BREAST IMPLANT REMOVAL Right 06/05/10   BUNIONECTOMY  1988   carcinoid Right    Right lower lobectomy 2017   COLONOSCOPY W/ POLYPECTOMY     EYE SURGERY  5/14 ; 6/14   Cataract Surgery   JOINT REPLACEMENT     2002 left; 2009 right Hip, rt knee 2012   KNEE ARTHROPLASTY Right 2012   [place] Right 01/05/2016   Procedure: RIGHT LOWER LOBE LOBECTOMY;  Surgeon: Melrose Nakayama, MD;  Location: [facility];  Service: Thoracic;  Laterality: Right;   MASTECTOMY PARTIAL / LUMPECTOMY W/ AXILLARY LYMPHADENECTOMY  04/05/1987  Right - Dr Annamaria Boots   RESECTION OF MEDIASTINAL MASS N/A 01/05/2016   Procedure: RESECTION OF PLEURAL MASS, right;  Surgeon: Melrose Nakayama, MD;  Location: [facility];  Service: Thoracic;  Laterality: N/A;   TONSILLECTOMY     TOTAL HIP ARTHROPLASTY     TUBAL LIGATION Bilateral 1970   [place]   myomata   VIDEO ASSISTED THORACOSCOPY (VATS)/WEDGE RESECTION Right 01/05/2016   Procedure: RIGHT VIDEO ASSISTED THORACOSCOPY (VATS)/WEDGE RESECTION;  Surgeon: Melrose Nakayama, MD;  Location: [facility];  Service: Thoracic;   Laterality: Right;    Allergies  Allergen Reactions   Aspirin Rash    Outpatient Encounter Medications as of 08/02/2021  Medication Sig   acetaminophen (TYLENOL) 500 MG tablet Take 2 tablets (1,000 mg total) by mouth every 6 (six) hours as needed for mild pain.   apixaban (ELIQUIS) 2.5 MG TABS tablet Take 1 tablet (2.5 mg total) by mouth 2 (two) times daily.   bisacodyl (DULCOLAX) 5 MG EC tablet Take 1 tablet (5 mg total) by mouth daily as needed for severe constipation.   Calcium Carbonate-Vitamin D 600-400 MG-UNIT tablet Take 1 tablet by mouth daily.   carboxymethylcellulose (REFRESH PLUS) 0.5 % SOLN Place 1 drop into both eyes 2 (two) times daily as needed (for dry eyes).   carvedilol (COREG) 6.25 MG tablet TAKE ONE TABLET TWICE DAILY AFTER MEALS   Ergocalciferol (VITAMIN D2) 10 MCG (400 UNIT) TABS Take 400 Units by mouth daily.   lactose free nutrition (BOOST) LIQD Take 237 mLs by mouth 2 (two) times daily between meals.   Multiple Vitamin (MULTIVITAMIN WITH MINERALS) TABS Take 1 tablet by mouth every evening.   Omega-3 Fatty Acids (FISH OIL) 1000 MG CAPS Take 1 capsule by mouth 2 (two) times daily.   pantoprazole (PROTONIX) 40 MG tablet TAKE 1 TABLET BY MOUTH DAILY.   polyethylene glycol (MIRALAX / GLYCOLAX) 17 g packet Take 17 g by mouth daily as needed for moderate constipation.   potassium chloride SA (KLOR-CON) 20 MEQ tablet Take 1 tablet (20 mEq total) by mouth daily.   rosuvastatin (CRESTOR) 10 MG tablet Take 1 tablet (10 mg total) by mouth at bedtime.   torsemide (DEMADEX) 20 MG tablet Take 20 mg by mouth 3 (three) times a week. Monday, Tuesday, and Friday   [DISCONTINUED] ketoconazole (NIZORAL) 2 % cream SMARTSIG:1 Topical Every Night   [DISCONTINUED] torsemide (DEMADEX) 20 MG tablet TAKE 1 TABLET BY MOUTH DAILY.   No facility-administered encounter medications on file as of 08/02/2021.    Review of Systems  Constitutional:  Negative for appetite change, chills, fatigue,  fever and unexpected weight change.  HENT:  Positive for hearing loss. Negative for congestion, rhinorrhea, sinus pressure, sinus pain, sneezing, sore throat, tinnitus and trouble swallowing.   Eyes:  Negative for pain, discharge, redness, itching and visual disturbance.  Respiratory:  Negative for cough, chest tightness, shortness of breath and wheezing.   Cardiovascular:  Negative for chest pain, palpitations and leg swelling.  Gastrointestinal:  Negative for abdominal distention, abdominal pain, blood in stool, constipation, diarrhea, nausea and vomiting.  Genitourinary:  Positive for dysuria and hematuria. Negative for difficulty urinating, flank pain, frequency and urgency.  Musculoskeletal:  Negative for arthralgias, back pain, gait problem, joint swelling, myalgias, neck pain and neck stiffness.  Skin:  Negative for color change and pallor.  Neurological:  Negative for dizziness, syncope, speech difficulty, weakness, light-headedness, numbness and headaches.  Hematological:  Does not bruise/bleed easily.  Psychiatric/Behavioral:  Negative  for agitation, behavioral problems, confusion, hallucinations and sleep disturbance. The patient is not nervous/anxious.    Immunization History  Administered Date(s) Administered   Influenza, High Dose Seasonal PF 07/27/2020   Influenza-Unspecified 06/30/2016   Moderna Sars-Covid-2 Vaccination 10/19/2019, 11/16/2019   Pneumococcal Polysaccharide-23 12/16/2001   Pneumococcal-Unspecified 07/30/2008, 10/22/2014   Td 12/16/2001   Tdap 10/15/2009   Tetanus 05/09/2010   Zoster, Live 07/02/2006   Pertinent  Health Maintenance Due  Topic Date Due   INFLUENZA VACCINE  05/15/2021   DEXA SCAN  Completed   Fall Risk  02/09/2021 01/13/2021 09/22/2020 08/04/2020  Falls in the past year? 0 0 0 0  Number falls in past yr: 0 0 0 0  Injury with Fall? 0 - - -   Functional Status Survey:    Vitals:   08/02/21 1334  BP: 112/76  Pulse: 92  Temp: 97.8 F  (36.6 C)  TempSrc: Temporal  SpO2: 92%  Weight: 131 lb (59.4 kg)  Height: 4\' 11"  (1.499 m)   Body mass index is 26.46 kg/m. Physical Exam Vitals reviewed.  Constitutional:      General: She is not in acute distress.    Appearance: Normal appearance. She is overweight. She is not ill-appearing or diaphoretic.  HENT:     Head: Normocephalic.     Mouth/Throat:     Pharynx: Oropharynx is clear. Posterior oropharyngeal erythema present.  Eyes:     General: No scleral icterus.       Right eye: No discharge.        Left eye: No discharge.     Conjunctiva/sclera: Conjunctivae normal.     Pupils: Pupils are equal, round, and reactive to light.  Cardiovascular:     Rate and Rhythm: Normal rate and regular rhythm.     Pulses: Normal pulses.     Heart sounds: Normal heart sounds. No murmur heard.   No friction rub. No gallop.  Pulmonary:     Effort: Pulmonary effort is normal. No respiratory distress.     Breath sounds: Normal breath sounds. No wheezing, rhonchi or rales.  Chest:     Chest wall: No tenderness.  Abdominal:     General: Bowel sounds are normal. There is no distension.     Palpations: Abdomen is soft. There is no mass.     Tenderness: There is abdominal tenderness in the suprapubic area. There is no right CVA tenderness, left CVA tenderness, guarding or rebound.  Musculoskeletal:        General: No swelling or tenderness. Normal range of motion.     Right lower leg: No edema.     Left lower leg: No edema.     Comments: Unsteady gait ambulates with a Walker   Skin:    General: Skin is warm and dry.     Findings: No bruising or erythema.  Neurological:     Mental Status: She is alert and oriented to person, place, and time.     Motor: No weakness.     Gait: Gait abnormal.     Comments: HOH  Psychiatric:        Mood and Affect: Mood normal.        Speech: Speech normal.        Behavior: Behavior normal.        Thought Content: Thought content normal.         Judgment: Judgment normal.    Labs reviewed: Recent Labs    01/05/21 0715 02/02/21 0715 05/31/21 1287  NA 142 137 142  K 5.0 4.7 4.6  CL 101 99 105  CO2 31 27 29   GLUCOSE 91 75 81  BUN 40* 28* 33*  CREATININE 1.37* 1.01* 1.25*  CALCIUM 9.5 9.3 9.6   Recent Labs    08/09/20 0735 01/05/21 0715 05/31/21 0844  AST 22 19 17   ALT 10 10 10   BILITOT 1.2 0.9 1.1  PROT 6.7 6.7 7.0   Recent Labs    08/09/20 0735 01/05/21 0715 05/31/21 0844  WBC 2.9* 4.3 3.4*  NEUTROABS 1,934 3,044 1,969  HGB 11.9 11.8 11.8  HCT 36.9 37.1 36.9  MCV 100.5* 100.3* 98.4  PLT 169 153 145   Lab Results  Component Value Date   TSH 1.98 02/02/2021   No results found for: HGBA1C Lab Results  Component Value Date   CHOL 119 01/05/2021   HDL 50 01/05/2021   LDLCALC 52 01/05/2021   LDLDIRECT 107.8 04/09/2007   TRIG 89 01/05/2021   CHOLHDL 2.4 01/05/2021    Significant Diagnostic Results in last 30 days:  No results found.  Assessment/Plan  1. Acute Cystitis with Hematuria Afebrile. Has slight suprapubic tenderness on exam  - POC Urinalysis Dipstick indicates yellow cloudy urine with moderate amount of blood and Leukocytes but negative for Nitrites.will send urine for culture and go ahead and start on Cipro.Made aware that if C&S indicates different antibiotics will need to switch antibiotics.does not want to wait for final Cx due to th blood in the urine which scared her.  Start on Cipro 500 mg tablet one by mouth twice daily x 7 days Advised to Take along with probiotics  to prevent antibiotics associated diarrhea Eats Yogurt daily in the evening.  - ciprofloxacin (CIPRO) 500 MG tablet; Take 1 tablet (500 mg total) by mouth 2 (two) times daily for 7 days.  Dispense: 14 tablet; Refill: 0 - Culture, Urine - Additional Educational information on UTI provided on AVS 2. Dysuria Suspect UTI  Start on Cipro as above.  - Culture, Urine  Family/ staff Communication: Reviewed plan of care  with patient verbalized understanding.   Labs/tests ordered:  - POC Urinalysis Dipstick  - Culture, Urine  Next Appointment: As needed if symptoms worsen or fail to improve    Sandrea Hughs, NP

## 2021-08-04 LAB — URINE CULTURE
MICRO NUMBER:: 12524861
SPECIMEN QUALITY:: ADEQUATE

## 2021-08-08 ENCOUNTER — Other Ambulatory Visit: Payer: Self-pay | Admitting: Nurse Practitioner

## 2021-08-23 ENCOUNTER — Other Ambulatory Visit: Payer: Self-pay | Admitting: Cardiovascular Disease

## 2021-08-23 NOTE — Telephone Encounter (Signed)
Prescription refill request for Eliquis received. Indication:Afib Last office visit:upcoming Scr:1.2 Age: 87 Weight:59.4 kg  Prescription refilled

## 2021-09-19 ENCOUNTER — Other Ambulatory Visit: Payer: Self-pay | Admitting: Adult Health

## 2021-09-29 ENCOUNTER — Other Ambulatory Visit: Payer: Self-pay | Admitting: Cardiovascular Disease

## 2021-10-12 ENCOUNTER — Non-Acute Institutional Stay: Payer: Medicare Other | Admitting: Nurse Practitioner

## 2021-10-12 ENCOUNTER — Encounter: Payer: Self-pay | Admitting: Nurse Practitioner

## 2021-10-12 ENCOUNTER — Other Ambulatory Visit: Payer: Self-pay

## 2021-10-12 DIAGNOSIS — N1832 Chronic kidney disease, stage 3b: Secondary | ICD-10-CM

## 2021-10-12 DIAGNOSIS — E785 Hyperlipidemia, unspecified: Secondary | ICD-10-CM

## 2021-10-12 DIAGNOSIS — K219 Gastro-esophageal reflux disease without esophagitis: Secondary | ICD-10-CM

## 2021-10-12 DIAGNOSIS — E871 Hypo-osmolality and hyponatremia: Secondary | ICD-10-CM

## 2021-10-12 DIAGNOSIS — D500 Iron deficiency anemia secondary to blood loss (chronic): Secondary | ICD-10-CM

## 2021-10-12 DIAGNOSIS — I482 Chronic atrial fibrillation, unspecified: Secondary | ICD-10-CM

## 2021-10-12 DIAGNOSIS — K5901 Slow transit constipation: Secondary | ICD-10-CM

## 2021-10-12 DIAGNOSIS — I5032 Chronic diastolic (congestive) heart failure: Secondary | ICD-10-CM | POA: Diagnosis not present

## 2021-10-12 DIAGNOSIS — I10 Essential (primary) hypertension: Secondary | ICD-10-CM | POA: Diagnosis not present

## 2021-10-12 DIAGNOSIS — R911 Solitary pulmonary nodule: Secondary | ICD-10-CM

## 2021-10-12 DIAGNOSIS — N952 Postmenopausal atrophic vaginitis: Secondary | ICD-10-CM

## 2021-10-12 DIAGNOSIS — J069 Acute upper respiratory infection, unspecified: Secondary | ICD-10-CM

## 2021-10-12 MED ORDER — AZITHROMYCIN 250 MG PO TABS: ORAL_TABLET | ORAL | 0 refills | Status: AC

## 2021-10-12 MED ORDER — GUAIFENESIN ER 600 MG PO TB12: 600.0000 mg | ORAL_TABLET | Freq: Two times a day (BID) | ORAL | 0 refills | Status: AC

## 2021-10-12 NOTE — Assessment & Plan Note (Signed)
Bun/creat 33/1.25 05/31/21

## 2021-10-12 NOTE — Assessment & Plan Note (Addendum)
stuffiness in ears, running nose, sore throat, facial pressure, cough, mucous in throat about a week. Tested negative for COVID. Denied chest pain, SOB, or fever, noted red and enlarged tonsils. Azithromycin x 5 days, Mucinex 600mg  bid x 5 days.

## 2021-10-12 NOTE — Assessment & Plan Note (Signed)
Heart rate is in control, takes Coreg, Eliquis, TSH 1.98 02/02/21

## 2021-10-12 NOTE — Assessment & Plan Note (Signed)
LDL 52 01/05/21, takes Rosuvastatin.

## 2021-10-12 NOTE — Assessment & Plan Note (Signed)
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 Small table lung nodules, no need for continued f/u CT scan.

## 2021-10-12 NOTE — Assessment & Plan Note (Signed)
Stable,  takes Pantoprazole

## 2021-10-12 NOTE — Assessment & Plan Note (Signed)
Stable, takes MiraLax, Bisacodyl.

## 2021-10-12 NOTE — Assessment & Plan Note (Signed)
chronic edema BLE, near none,  takes Torsemide 4x/wk since 01/13/21, EF 60-65%, hospitalized 05/30/20-06/08/20 for acute on CHF,  . F/u cardiology,  on 2032ml/day fluid restriction. Bun/creat 33/1.25 05/31/21

## 2021-10-12 NOTE — Assessment & Plan Note (Signed)
stable, Hgb 11.8 05/31/21,  off Fe

## 2021-10-12 NOTE — Assessment & Plan Note (Signed)
Blood pressure is controlled, takes  Coreg, Rosuvastatin. Bun/creat  33/1.25 05/31/21

## 2021-10-12 NOTE — Assessment & Plan Note (Signed)
takes Estradiol vaginal tab

## 2021-10-12 NOTE — Progress Notes (Signed)
Location:   clinic [facility]   Place of Service:  Clinic (12) Provider: Marlana Latus NP  Code Status: DNR Goals of Care: [place] 10/12/2021  Does Patient Have a Medical Advance Directive? Yes  Type of Paramedic of [place];Living will  Does patient want to make changes to medical advance directive? No - Patient declined  Copy of [place] in Chart? Yes - validated most recent copy scanned in chart (See row information)  Would patient like information on creating a medical advance directive? -  Pre-existing out of facility DNR order (yellow form or pink MOST form) -     Chief Complaint  Patient presents with   Acute Visit    Patient c/o ringing and stuffiness in ears, runny nose, facial pressure, sore throat, cough, and mucous in throat x 1 week. Patient was tested for covid on Wednesday and it was negative.     HPI: Patient is a 87 y.o. female seen today for c/o stuffiness in ears, running nose, sore throat, facial pressure, cough, mucous in throat about a week. Tested negative for COVID. Denied chest pain, SOB, or fever.     Hyponatremia, 142 05/31/21, had Nephrology evaluation. 2023ml/day fluid restriction.              HTN, takes  Coreg, Rosuvastatin. Bun/creat  33/1.25 05/31/21             Afib, takes Coreg, Eliquis, TSH 1.98 02/02/21             CHF, chronic edema BLE, near none,  takes Torsemide 4x/wk since 01/13/21, EF 60-65%, hospitalized 05/30/20-06/08/20 for acute on CHF,  . F/u cardiology,  on 2071ml/day fluid restriction. Bun/creat 33/1.25 05/31/21             CKD, Bun/creat 33/1.25 05/31/21             Constipation, takes MiraLax, Bisacodyl.              Atrophic vaginitis, takes Estradiol vaginal tab             GERD takes Pantoprazole             Anemia, stable, Hgb 11.8 05/31/21,  off Fe             Hyperlipidemia, LDL 52 01/05/21, takes Rosuvastatin.              Hx of carcinoid bronchia adenoma or R lung 5/21 showed  stable nodules, f/u Surgeon prn, 5 years post resection, no evidence of recurrence. Small table lung nodules, no need for continued f/u CT scan.    Past Medical History:  Diagnosis Date   Arthritis 1996   Atrial fibrillation ([place])    on coumadin   Atrial tachycardia ([place])    Cancer ([place]) 1988   breast cancer, R mastectomy, chemo   Carcinoid bronchial adenoma of right lung ([place]) 01/05/2016   Right lower lobectomy   Diverticulitis 1999   Femur fracture ([place]) 03/2013   left, non weight bearing for 2 1/2 weeks   GERD (gastroesophageal reflux disease)    Hemorrhoids 1991   HTN (hypertension)    Lymphedema of upper extremity    right arm   Osteoporosis    PONV (postoperative nausea and vomiting)    Urinary, incontinence, stress female    Vitamin D deficiency disease     Past Surgical History:  Procedure Laterality Date   APPENDECTOMY     BREAST BIOPSY  05/04/1987  Right Dr Annamaria Boots   BREAST IMPLANT REMOVAL Right 06/05/10   BUNIONECTOMY  1988   carcinoid Right    Right lower lobectomy 2017   COLONOSCOPY W/ POLYPECTOMY     EYE SURGERY  5/14 ; 6/14   Cataract Surgery   JOINT REPLACEMENT     2002 left; 2009 right Hip, rt knee 2012   KNEE ARTHROPLASTY Right 2012   [employer]   LOBECTOMY Right 01/05/2016   Procedure: RIGHT LOWER LOBE LOBECTOMY;  Surgeon: Melrose Nakayama, MD;  Location: [facility];  Service: Thoracic;  Laterality: Right;   MASTECTOMY PARTIAL / LUMPECTOMY W/ AXILLARY LYMPHADENECTOMY  04/05/1987   Right - Dr Annamaria Boots   RESECTION OF MEDIASTINAL MASS N/A 01/05/2016   Procedure: RESECTION OF PLEURAL MASS, right;  Surgeon: Melrose Nakayama, MD;  Location: [facility];  Service: Thoracic;  Laterality: N/A;   TONSILLECTOMY     TOTAL HIP ARTHROPLASTY     TUBAL LIGATION Bilateral 1970   [place]   myomata   VIDEO ASSISTED THORACOSCOPY (VATS)/WEDGE RESECTION Right 01/05/2016   Procedure: RIGHT VIDEO ASSISTED THORACOSCOPY (VATS)/WEDGE RESECTION;  Surgeon: Melrose Nakayama, MD;  Location: [facility];  Service: Thoracic;  Laterality: Right;    Allergies  Allergen Reactions   Aspirin Rash    Allergies as of 10/12/2021       Reactions   Aspirin Rash        Medication List        Accurate as of October 12, 2021 11:59 PM. If you have any questions, ask your nurse or doctor.          acetaminophen 500 MG tablet Commonly known as: TYLENOL Take 2 tablets (1,000 mg total) by mouth every 6 (six) hours as needed for mild pain.   azithromycin 250 MG tablet Commonly known as: ZITHROMAX Take 2 tablets on day 1, then 1 tablet daily on days 2 through 5 Started by: Liliana Dang X Jannett Schmall, NP   bisacodyl 5 MG EC tablet Commonly known as: DULCOLAX Take 1 tablet (5 mg total) by mouth daily as needed for severe constipation.   Calcium Carbonate-Vitamin D 600-400 MG-UNIT tablet Take 1 tablet by mouth daily.   carboxymethylcellulose 0.5 % Soln Commonly known as: REFRESH PLUS Place 1 drop into both eyes 2 (two) times daily as needed (for dry eyes).   carvedilol 6.25 MG tablet Commonly known as: COREG TAKE ONE TABLET TWICE DAILY AFTER MEALS   Eliquis 2.5 MG Tabs tablet Generic drug: apixaban TAKE 1 TABLET BY MOUTH TWICE DAILY.   Fish Oil 1000 MG Caps Take 1 capsule by mouth 2 (two) times daily.   gabapentin 100 MG capsule Commonly known as: NEURONTIN Take 100 mg by mouth daily.   guaiFENesin 600 MG 12 hr tablet Commonly known as: Mucinex Take 1 tablet (600 mg total) by mouth 2 (two) times daily for 5 days. Started by: Maddon Horton X Oreta Soloway, NP   lactose free nutrition Liqd Take 237 mLs by mouth 2 (two) times daily between meals.   multivitamin with minerals Tabs tablet Take 1 tablet by mouth every evening.   pantoprazole 40 MG tablet Commonly known as: PROTONIX TAKE ONE TABLET BY MOUTH DAILY   polyethylene glycol 17 g packet Commonly known as: MIRALAX / GLYCOLAX Take 17 g by mouth daily as needed for moderate constipation.   potassium chloride  SA 20 MEQ tablet Commonly known as: KLOR-CON M TAKE ONE TABLET BY MOUTH DAILY   rosuvastatin 10 MG tablet Commonly  known as: CRESTOR TAKE ONE TABLET AT BEDTIME.   torsemide 20 MG tablet Commonly known as: DEMADEX Take 20 mg by mouth 4 (four) times a week. Monday, Tuesday, Thursday, and Friday   Vitamin D2 10 MCG (400 UNIT) Tabs Take 400 Units by mouth daily.        Review of Systems:  Review of Systems  Constitutional:  Positive for appetite change and fatigue. Negative for fever.  HENT:  Positive for congestion, hearing loss, rhinorrhea, sinus pressure and sore throat. Negative for ear pain, facial swelling and trouble swallowing.        Yellowish appearance of tongue.   Eyes:  Negative for visual disturbance.  Respiratory:  Positive for cough. Negative for shortness of breath and wheezing.        Hx of right lower lobectomy 2017. Chronic cough remains the same. Uses O2 via [place] at night   Cardiovascular:  Negative for leg swelling.  Gastrointestinal:  Negative for abdominal pain and constipation.  Genitourinary:  Positive for frequency. Negative for dysuria and urgency.       2-3x/night for years   Musculoskeletal:  Positive for arthralgias and gait problem.  Skin:  Negative for color change.  Neurological:  Negative for speech difficulty, weakness and headaches.  Psychiatric/Behavioral:  Negative for behavioral problems and sleep disturbance. The patient is not nervous/anxious.    Health Maintenance  Topic Date Due   Zoster Vaccines- Shingrix (1 of 2) Never done   Pneumonia Vaccine 99+ Years old (2 - PCV) 10/23/2015   COVID-19 Vaccine (3 - Moderna risk series) 12/14/2019   TETANUS/TDAP  05/09/2020   INFLUENZA VACCINE  Completed   DEXA SCAN  Completed   HPV VACCINES  Aged Out    Physical Exam: Vitals:   10/12/21 1125 10/12/21 1350  BP: 138/90 130/86  Pulse: 68   Temp: 97.6 F (36.4 C)   TempSrc: Temporal   SpO2: 97%   Weight: 129 lb (58.5 kg)   Height: 4\' 11"   (1.499 m)    Body mass index is 26.05 kg/m. Physical Exam Vitals and nursing note reviewed.  Constitutional:      Comments: Tired.   HENT:     Head: Normocephalic and atraumatic.     Mouth/Throat:     Mouth: Mucous membranes are dry.     Pharynx: Posterior oropharyngeal erythema present. No oropharyngeal exudate.     Comments: Red and enlarged tonsils.  Eyes:     Extraocular Movements: Extraocular movements intact.     Conjunctiva/sclera: Conjunctivae normal.     Pupils: Pupils are equal, round, and reactive to light.  Cardiovascular:     Rate and Rhythm: Normal rate. Rhythm irregular.     Heart sounds: No murmur heard. Pulmonary:     Breath sounds: Rales present. No wheezing.     Comments: s/p right lung lobectomy 2017. Right lumpectomy 30 years ago. Posterior left basilar rales.  Abdominal:     General: Bowel sounds are normal.     Palpations: Abdomen is soft.     Tenderness: There is no abdominal tenderness.  Musculoskeletal:     Cervical back: Normal range of motion and neck supple.     Right lower leg: No edema.     Left lower leg: No edema.  Skin:    General: Skin is warm and dry.  Neurological:     General: No focal deficit present.     Mental Status: She is alert and oriented to person, place, and time. Mental  status is at baseline.     Motor: No weakness.     Coordination: Coordination normal.     Gait: Gait abnormal.  Psychiatric:        Mood and Affect: Mood normal.        Behavior: Behavior normal.        Thought Content: Thought content normal.        Judgment: Judgment normal.    Labs reviewed: Basic Metabolic Panel: Recent Labs    01/05/21 0715 02/02/21 0715 05/31/21 0844  NA 142 137 142  K 5.0 4.7 4.6  CL 101 99 105  CO2 31 27 29   GLUCOSE 91 75 81  BUN 40* 28* 33*  CREATININE 1.37* 1.01* 1.25*  CALCIUM 9.5 9.3 9.6  TSH  --  1.98  --    Liver Function Tests: Recent Labs    01/05/21 0715 05/31/21 0844  AST 19 17  ALT 10 10  BILITOT  0.9 1.1  PROT 6.7 7.0   No results for input(s): LIPASE, AMYLASE in the last 8760 hours. No results for input(s): AMMONIA in the last 8760 hours. CBC: Recent Labs    01/05/21 0715 05/31/21 0844  WBC 4.3 3.4*  NEUTROABS 3,044 1,969  HGB 11.8 11.8  HCT 37.1 36.9  MCV 100.3* 98.4  PLT 153 145   Lipid Panel: Recent Labs    01/05/21 0715  CHOL 119  HDL 50  LDLCALC 52  TRIG 89  CHOLHDL 2.4   No results found for: HGBA1C  Procedures since last visit: No results found.  Assessment/Plan  Hyponatremia 142 05/31/21, had Nephrology evaluation.   Essential hypertension Blood pressure is controlled, takes  Coreg, Rosuvastatin. Bun/creat  33/1.25 05/31/21  Atrial fibrillation, chronic (HCC) Heart rate is in control, takes Coreg, Eliquis, TSH 1.98 02/02/21  Congestive heart failure (CHF) (HCC) chronic edema BLE, near none,  takes Torsemide 4x/wk since 01/13/21, EF 60-65%, hospitalized 05/30/20-06/08/20 for acute on CHF,  . F/u cardiology,  on 2019ml/day fluid restriction. Bun/creat 33/1.25 05/31/21  CKD (chronic kidney disease) stage 3, GFR 30-59 ml/min (HCC) Bun/creat 33/1.25 05/31/21  Slow transit constipation Stable, takes MiraLax, Bisacodyl.   Atrophic vaginitis  takes Estradiol vaginal tab  GERD (gastroesophageal reflux disease) Stable,  takes Pantoprazole  IDA (iron deficiency anemia) stable, Hgb 11.8 05/31/21,  off Fe  Hyperlipidemia  LDL 52 01/05/21, takes Rosuvastatin.   Lung nodule  Hx of carcinoid bronchia adenoma or R lung 5/21 showed stable nodules, f/u Surgeon prn, 5 years post resection, no evidence of recurrence. Small table lung nodules, no need for continued f/u CT scan.   Upper respiratory infection stuffiness in ears, running nose, sore throat, facial pressure, cough, mucous in throat about a week. Tested negative for COVID. Denied chest pain, SOB, or fever, noted red and enlarged tonsils. Azithromycin x 5 days, Mucinex 600mg  bid x 5 days.    Labs/tests  ordered:  none  Next appt:  2 weeks AWV

## 2021-10-12 NOTE — Assessment & Plan Note (Signed)
142 05/31/21, had Nephrology evaluation.

## 2021-10-13 ENCOUNTER — Encounter: Payer: Self-pay | Admitting: Nurse Practitioner

## 2021-10-17 ENCOUNTER — Other Ambulatory Visit: Payer: Medicare Other

## 2021-10-17 ENCOUNTER — Other Ambulatory Visit: Payer: Self-pay

## 2021-10-17 DIAGNOSIS — D500 Iron deficiency anemia secondary to blood loss (chronic): Secondary | ICD-10-CM

## 2021-10-17 DIAGNOSIS — I5032 Chronic diastolic (congestive) heart failure: Secondary | ICD-10-CM

## 2021-10-17 LAB — CBC WITH DIFFERENTIAL/PLATELET
Absolute Monocytes: 400 cells/uL (ref 200–950)
Basophils Absolute: 18 cells/uL (ref 0–200)
Basophils Relative: 0.4 %
Eosinophils Absolute: 18 cells/uL (ref 15–500)
Eosinophils Relative: 0.4 %
HCT: 37.2 % (ref 35.0–45.0)
Hemoglobin: 12.1 g/dL (ref 11.7–15.5)
Lymphs Abs: 957 cells/uL (ref 850–3900)
MCH: 31.8 pg (ref 27.0–33.0)
MCHC: 32.5 g/dL (ref 32.0–36.0)
MCV: 97.9 fL (ref 80.0–100.0)
MPV: 9.5 fL (ref 7.5–12.5)
Monocytes Relative: 8.7 %
Neutro Abs: 3206 cells/uL (ref 1500–7800)
Neutrophils Relative %: 69.7 %
Platelets: 189 10*3/uL (ref 140–400)
RBC: 3.8 10*6/uL (ref 3.80–5.10)
RDW: 13.1 % (ref 11.0–15.0)
Total Lymphocyte: 20.8 %
WBC: 4.6 10*3/uL (ref 3.8–10.8)

## 2021-10-17 LAB — COMPLETE METABOLIC PANEL WITH GFR
AG Ratio: 1.6 (calc) (ref 1.0–2.5)
ALT: 12 U/L (ref 6–29)
AST: 28 U/L (ref 10–35)
Albumin: 4.3 g/dL (ref 3.6–5.1)
Alkaline phosphatase (APISO): 64 U/L (ref 37–153)
BUN/Creatinine Ratio: 17 (calc) (ref 6–22)
BUN: 19 mg/dL (ref 7–25)
CO2: 26 mmol/L (ref 20–32)
Calcium: 9.5 mg/dL (ref 8.6–10.4)
Chloride: 96 mmol/L — ABNORMAL LOW (ref 98–110)
Creat: 1.14 mg/dL — ABNORMAL HIGH (ref 0.60–0.95)
Globulin: 2.7 g/dL (calc) (ref 1.9–3.7)
Glucose, Bld: 99 mg/dL (ref 65–99)
Potassium: 4.8 mmol/L (ref 3.5–5.3)
Sodium: 131 mmol/L — ABNORMAL LOW (ref 135–146)
Total Bilirubin: 0.9 mg/dL (ref 0.2–1.2)
Total Protein: 7 g/dL (ref 6.1–8.1)
eGFR: 47 mL/min/{1.73_m2} — ABNORMAL LOW (ref >=60)

## 2021-10-19 ENCOUNTER — Other Ambulatory Visit: Payer: Self-pay

## 2021-10-19 ENCOUNTER — Non-Acute Institutional Stay: Payer: Medicare Other | Admitting: Nurse Practitioner

## 2021-10-19 ENCOUNTER — Encounter: Payer: Self-pay | Admitting: Nurse Practitioner

## 2021-10-19 DIAGNOSIS — J069 Acute upper respiratory infection, unspecified: Secondary | ICD-10-CM | POA: Diagnosis not present

## 2021-10-19 DIAGNOSIS — E871 Hypo-osmolality and hyponatremia: Secondary | ICD-10-CM

## 2021-10-19 DIAGNOSIS — N1832 Chronic kidney disease, stage 3b: Secondary | ICD-10-CM | POA: Diagnosis not present

## 2021-10-19 DIAGNOSIS — K5901 Slow transit constipation: Secondary | ICD-10-CM

## 2021-10-19 DIAGNOSIS — D500 Iron deficiency anemia secondary to blood loss (chronic): Secondary | ICD-10-CM

## 2021-10-19 DIAGNOSIS — I5032 Chronic diastolic (congestive) heart failure: Secondary | ICD-10-CM

## 2021-10-19 DIAGNOSIS — I10 Essential (primary) hypertension: Secondary | ICD-10-CM

## 2021-10-19 DIAGNOSIS — K219 Gastro-esophageal reflux disease without esophagitis: Secondary | ICD-10-CM

## 2021-10-19 DIAGNOSIS — E785 Hyperlipidemia, unspecified: Secondary | ICD-10-CM

## 2021-10-19 DIAGNOSIS — N952 Postmenopausal atrophic vaginitis: Secondary | ICD-10-CM

## 2021-10-19 DIAGNOSIS — I482 Chronic atrial fibrillation, unspecified: Secondary | ICD-10-CM | POA: Diagnosis not present

## 2021-10-19 NOTE — Assessment & Plan Note (Signed)
takes Estradiol vaginal tab

## 2021-10-19 NOTE — Assessment & Plan Note (Signed)
stable, Hgb 12.1 10/17/21, off Fe

## 2021-10-19 NOTE — Assessment & Plan Note (Signed)
131 10/17/21, had Nephrology evaluation. 2018ml/day fluid restriction.

## 2021-10-19 NOTE — Progress Notes (Signed)
Location:   clinic [facility]   Place of Service:  Clinic (12) Provider: Marlana Latus NP  Code Status: DNR Goals of Care[place] 10/19/2021  Does Patient Have a Medical Advance Directive? Yes  Type of Paramedic of [place];Living will  Does patient want to make changes to medical advance directive? No - Patient declined  Copy of [place] in Chart? Yes - validated most recent copy scanned in chart (See row information)  Would patient like information on creating a medical advance directive? -  Pre-existing out of facility DNR order (yellow form or pink MOST form) -     Chief Complaint  Patient presents with   Follow-up    1 week follow-up and discuss labs (copy printed)     HPI: Patient is a 88 y.o. female seen today for medical management of chronic diseases.     Hyponatremia, 131 10/17/21, had Nephrology evaluation. 2064ml/day fluid restriction.              HTN, takes  Coreg, Rosuvastatin. Bun/creat  19/1.14 10/17/21             Afib, takes Coreg, Eliquis, TSH 1.98 02/02/21             CHF, chronic edema BLE, near none,  takes Torsemide 4x/wk since 01/13/21, EF 60-65%, hospitalized 05/30/20-06/08/20 for acute on CHF,  . F/u cardiology,  on 2061ml/day fluid restriction. Bun/creat 19/1.14 10/17/21             CKD, Bun/creat 19/1.14 10/17/21             Constipation, takes MiraLax, Bisacodyl.              Atrophic vaginitis, takes Estradiol vaginal tab             GERD takes Pantoprazole             Anemia, stable, Hgb 12.1 10/17/21, off Fe             Hyperlipidemia, LDL 52 01/05/21, takes Rosuvastatin.              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 Small table lung nodules, no need for continued f/u CT scan.      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   Right lower lobectomy   Diverticulitis 1999   Femur fracture ([place]) 03/2013   left, non weight bearing for 2 1/2 weeks   GERD (gastroesophageal reflux disease)    Hemorrhoids 1991   HTN (hypertension)    Lymphedema of upper extremity    right arm   Osteoporosis    PONV (postoperative nausea and vomiting)    Urinary, incontinence, stress female    Vitamin D deficiency disease     Past Surgical History:  Procedure Laterality Date   APPENDECTOMY     BREAST BIOPSY  05/04/1987   Right Dr Annamaria Boots   BREAST IMPLANT REMOVAL Right 06/05/10   BUNIONECTOMY  1988   carcinoid Right    Right lower lobectomy 2017   COLONOSCOPY W/ POLYPECTOMY     EYE SURGERY  5/14 ; 6/14   Cataract  Surgery   JOINT REPLACEMENT     2002 left; 2009 right Hip, rt knee 2012   KNEE ARTHROPLASTY Right 2012   [place]   LOBECTOMY Right 01/05/2016   Procedure: RIGHT LOWER LOBE LOBECTOMY;  Surgeon: Melrose Nakayama, MD;  Location: [facility];  Service: Thoracic;  Laterality: Right;   MASTECTOMY PARTIAL / LUMPECTOMY W/ AXILLARY LYMPHADENECTOMY  04/05/1987   Right - Dr Annamaria Boots   RESECTION OF MEDIASTINAL MASS N/A 01/05/2016   Procedure: RESECTION OF PLEURAL MASS, right;  Surgeon: Melrose Nakayama, MD;  Location: [facility];  Service: Thoracic;  Laterality: N/A;   TONSILLECTOMY     TOTAL HIP ARTHROPLASTY     TUBAL LIGATION Bilateral 1970   [place]   myomata   VIDEO ASSISTED THORACOSCOPY (VATS)/WEDGE RESECTION Right 01/05/2016   Procedure: RIGHT VIDEO ASSISTED THORACOSCOPY (VATS)/WEDGE RESECTION;  Surgeon: Melrose Nakayama, MD;  Location: [facility];  Service: Thoracic;  Laterality: Right;    Allergies  Allergen Reactions   Aspirin Rash    Allergies as of 10/19/2021       Reactions   Aspirin Rash        Medication List        Accurate as of October 19, 2021 11:59 PM. If  you have any questions, ask your nurse or doctor.          acetaminophen 500 MG tablet Commonly known as: TYLENOL Take 2 tablets (1,000 mg total) by mouth every 6 (six) hours as needed for mild pain.   bisacodyl 5 MG EC tablet Commonly known as: DULCOLAX Take 1 tablet (5 mg total) by mouth daily as needed for severe constipation.   Calcium Carbonate-Vitamin D 600-400 MG-UNIT tablet Take 1 tablet by mouth daily.   carboxymethylcellulose 0.5 % Soln Commonly known as: REFRESH PLUS Place 1 drop into both eyes 2 (two) times daily as needed (for dry eyes).   carvedilol 6.25 MG tablet Commonly known as: COREG TAKE ONE TABLET TWICE DAILY AFTER MEALS   Eliquis 2.5 MG Tabs tablet Generic drug: apixaban TAKE 1 TABLET BY MOUTH TWICE DAILY.   Fish Oil 1000 MG Caps Take 1 capsule by mouth 2 (two) times daily.   gabapentin 100 MG capsule Commonly known as: NEURONTIN Take 100 mg by mouth daily.   lactose free nutrition Liqd Take 237 mLs by mouth 2 (two) times daily between meals.   multivitamin with minerals Tabs tablet Take 1 tablet by mouth every evening.   pantoprazole 40 MG tablet Commonly known as: PROTONIX TAKE ONE TABLET BY MOUTH DAILY   polyethylene glycol 17 g packet Commonly known as: MIRALAX / GLYCOLAX Take 17 g by mouth daily as needed for moderate constipation.   potassium chloride SA 20 MEQ tablet Commonly known as: KLOR-CON M TAKE ONE TABLET BY MOUTH DAILY   rosuvastatin 10 MG tablet Commonly known as: CRESTOR TAKE ONE TABLET AT BEDTIME.   torsemide 20 MG tablet Commonly known as: DEMADEX Take 20 mg by mouth 4 (four) times a week. Monday, Tuesday, Thursday, and Friday   Vitamin D2 10 MCG (400 UNIT) Tabs Take 400 Units by mouth daily.        Review of Systems:  Review of Systems  Constitutional:  Negative for appetite change, fatigue and fever.  HENT:  Positive for hearing loss. Negative for congestion, ear pain and sore throat.   Eyes:  Negative  for visual disturbance.  Respiratory:  Positive for cough. Negative for shortness of breath.  Hx of right lower lobectomy 2017. Chronic cough remains the same. Uses O2 via [place] at night   Cardiovascular:  Negative for leg swelling.  Gastrointestinal:  Negative for abdominal pain and constipation.  Genitourinary:  Positive for frequency. Negative for dysuria and urgency.       2-3x/night for years   Musculoskeletal:  Positive for arthralgias and gait problem.  Skin:  Negative for color change.  Neurological:  Negative for speech difficulty, weakness and headaches.  Psychiatric/Behavioral:  Negative for behavioral problems and sleep disturbance. The patient is not nervous/anxious.    Health Maintenance  Topic Date Due   Pneumonia Vaccine 18+ Years old (2 - PCV) 10/23/2015   COVID-19 Vaccine (3 - Moderna risk series) 12/14/2019   TETANUS/TDAP  05/09/2020   Zoster Vaccines- Shingrix (2 of 2) 06/19/2021   INFLUENZA VACCINE  Completed   DEXA SCAN  Completed   HPV VACCINES  Aged Out    Physical Exam: Vitals:   10/19/21 1105 10/19/21 1517  BP: (!) 142/90 138/84  Pulse: 60   Temp: 97.8 F (36.6 C)   TempSrc: Temporal   SpO2: 97%   Weight: 131 lb 6.4 oz (59.6 kg)   Height: 4\' 11"  (1.499 m)    Body mass index is 26.54 kg/m. Physical Exam Vitals and nursing note reviewed.  Constitutional:      Appearance: Normal appearance.  HENT:     Head: Normocephalic and atraumatic.     Mouth/Throat:     Mouth: Mucous membranes are dry.     Comments: Red and enlarged tonsils.  Eyes:     Extraocular Movements: Extraocular movements intact.     Conjunctiva/sclera: Conjunctivae normal.     Pupils: Pupils are equal, round, and reactive to light.  Cardiovascular:     Rate and Rhythm: Normal rate. Rhythm irregular.     Heart sounds: No murmur heard. Pulmonary:     Breath sounds: Rales present. No wheezing.     Comments: s/p right lung lobectomy 2017. Right lumpectomy 30 years ago.  Posterior left basilar rales.  Abdominal:     General: Bowel sounds are normal.     Palpations: Abdomen is soft.     Tenderness: There is no abdominal tenderness.  Musculoskeletal:     Cervical back: Normal range of motion.     Right lower leg: No edema.     Left lower leg: No edema.  Skin:    General: Skin is warm and dry.  Neurological:     General: No focal deficit present.     Mental Status: She is alert and oriented to person, place, and time. Mental status is at baseline.     Motor: No weakness.     Coordination: Coordination normal.     Gait: Gait abnormal.  Psychiatric:        Mood and Affect: Mood normal.        Behavior: Behavior normal.        Thought Content: Thought content normal.        Judgment: Judgment normal.    Labs reviewed: Basic Metabolic Panel: Recent Labs    02/02/21 0715 05/31/21 0844 10/17/21 0710  NA 137 142 131*  K 4.7 4.6 4.8  CL 99 105 96*  CO2 27 29 26   GLUCOSE 75 81 99  BUN 28* 33* 19  CREATININE 1.01* 1.25* 1.14*  CALCIUM 9.3 9.6 9.5  TSH 1.98  --   --    Liver Function Tests: Recent Labs  01/05/21 0715 05/31/21 0844 10/17/21 0710  AST 19 17 28   ALT 10 10 12   BILITOT 0.9 1.1 0.9  PROT 6.7 7.0 7.0   No results for input(s): LIPASE, AMYLASE in the last 8760 hours. No results for input(s): AMMONIA in the last 8760 hours. CBC: Recent Labs    01/05/21 0715 05/31/21 0844 10/17/21 0710  WBC 4.3 3.4* 4.6  NEUTROABS 3,044 1,969 3,206  HGB 11.8 11.8 12.1  HCT 37.1 36.9 37.2  MCV 100.3* 98.4 97.9  PLT 153 145 189   Lipid Panel: Recent Labs    01/05/21 0715  CHOL 119  HDL 50  LDLCALC 52  TRIG 89  CHOLHDL 2.4   No results found for: HGBA1C  Procedures since last visit: No results found.  Assessment/Plan  Upper respiratory infection Healed, fully treated with Z-pk, Mucinex.   Atrial fibrillation, chronic (HCC) Heart rate is in control,  takes Coreg, Eliquis, TSH 1.98 02/02/21  Congestive heart failure (CHF)  (HCC) chronic edema BLE, near none,  takes Torsemide 4x/wk since 01/13/21, EF 60-65%, hospitalized 05/30/20-06/08/20 for acute on CHF,  . F/u cardiology,  on 2068ml/day fluid restriction. Bun/creat 19/1.14 10/17/21              CKD (chronic kidney disease) stage 3, GFR 30-59 ml/min (HCC)  CKD, Bun/creat 19/1.14 10/17/21  Slow transit constipation Stable, takes MiraLax, Bisacodyl.   Atrophic vaginitis  takes Estradiol vaginal tab  GERD (gastroesophageal reflux disease) Stable,  takes Pantoprazole  IDA (iron deficiency anemia) stable, Hgb 12.1 10/17/21, off Fe  Hyperlipidemia LDL 52 01/05/21, takes Rosuvastatin.   Hyponatremia 131 10/17/21, had Nephrology evaluation. 2023ml/day fluid restriction.   Essential hypertension Blood pressure is controlled, takes  Coreg, Rosuvastatin. Bun/creat  19/1.14 10/17/21   Labs/tests ordered:  BMP prior  Next appt:  6 months.

## 2021-10-19 NOTE — Assessment & Plan Note (Signed)
Stable, takes MiraLax, Bisacodyl.

## 2021-10-19 NOTE — Assessment & Plan Note (Signed)
CKD, Bun/creat 19/1.14 10/17/21

## 2021-10-19 NOTE — Assessment & Plan Note (Signed)
Heart rate is in control,  takes Coreg, Eliquis, TSH 1.98 02/02/21

## 2021-10-19 NOTE — Assessment & Plan Note (Signed)
LDL 52 01/05/21, takes Rosuvastatin.

## 2021-10-19 NOTE — Assessment & Plan Note (Signed)
Stable,  takes Pantoprazole

## 2021-10-19 NOTE — Assessment & Plan Note (Signed)
chronic edema BLE, near none,  takes Torsemide 4x/wk since 01/13/21, EF 60-65%, hospitalized 05/30/20-06/08/20 for acute on CHF,  . F/u cardiology,  on 2028ml/day fluid restriction. Bun/creat 19/1.14 10/17/21

## 2021-10-19 NOTE — Assessment & Plan Note (Signed)
Blood pressure is controlled, takes  Coreg, Rosuvastatin. Bun/creat  19/1.14 10/17/21

## 2021-10-19 NOTE — Assessment & Plan Note (Signed)
Healed, fully treated with Z-pk, Mucinex.

## 2021-10-23 ENCOUNTER — Encounter: Payer: Self-pay | Admitting: Nurse Practitioner

## 2021-10-26 ENCOUNTER — Other Ambulatory Visit: Payer: Self-pay

## 2021-10-26 ENCOUNTER — Encounter: Payer: Self-pay | Admitting: Nurse Practitioner

## 2021-10-26 ENCOUNTER — Non-Acute Institutional Stay (INDEPENDENT_AMBULATORY_CARE_PROVIDER_SITE_OTHER): Payer: Medicare Other | Admitting: Nurse Practitioner

## 2021-10-26 VITALS — BP 128/68 | HR 65 | Temp 97.1°F | Ht 59.0 in | Wt 130.2 lb

## 2021-10-26 DIAGNOSIS — Z0000 Encounter for general adult medical examination without abnormal findings: Secondary | ICD-10-CM

## 2021-10-26 NOTE — Progress Notes (Addendum)
Subjective:   [name] is a 88 y.o. female who presents for Medicare Annual (Subsequent) preventive examination in the clinic at [facility].  Cardiac Risk Factors include: advanced age (>30men, >88 women);dyslipidemia;hypertension     Objective:    Today's Vitals   10/26/21 1419 10/26/21 1426  BP:  128/68  Pulse:  65  Temp:  (!) 97.1 F (36.2 C)  SpO2:  98%  Weight:  130 lb 3.2 oz (59.1 kg)  Height:  4\' 11"  (1.499 m)  PainSc: 3     Body mass index is 26.3 kg/m.  Advanced Directives 10/19/2021 10/12/2021 06/08/2021 02/09/2021 06/14/2020 05/30/2020 01/26/2017  Does Patient Have a Medical Advance Directive? Yes Yes Yes Yes Yes Yes Yes  Type of Paramedic of [place];Living will [place];Living will [place];Living will [place];Living will Living will;Healthcare Power of [place];Living will  Does patient want to make changes to medical advance directive? No - Patient declined No - Patient declined No - Patient declined No - Patient declined No - Patient declined - -  Copy of Regino Ramirez in Chart? Yes - validated most recent copy scanned in chart (See row information) Yes - validated most recent copy scanned in chart (See row information) Yes - validated most recent copy scanned in chart (See row information) No - copy requested Yes - validated most recent copy scanned in chart (See row information) - -  Would patient like information on creating a medical advance directive? - - - - - - -  Pre-existing out of facility DNR order (yellow form or pink MOST form) - - - - - - -    Current Medications (verified) Outpatient Encounter Medications as of 10/26/2021  Medication Sig   acetaminophen (TYLENOL) 500 MG tablet Take 2 tablets (1,000 mg total) by mouth every 6 (six) hours as needed for mild pain.   apixaban (ELIQUIS) 2.5 MG TABS tablet TAKE 1  TABLET BY MOUTH TWICE DAILY.   bisacodyl (DULCOLAX) 5 MG EC tablet Take 1 tablet (5 mg total) by mouth daily as needed for severe constipation.   Calcium Carbonate-Vitamin D 600-400 MG-UNIT tablet Take 1 tablet by mouth daily.   carboxymethylcellulose (REFRESH PLUS) 0.5 % SOLN Place 1 drop into both eyes 2 (two) times daily as needed (for dry eyes).   Ergocalciferol (VITAMIN D2) 10 MCG (400 UNIT) TABS Take 400 Units by mouth daily.   gabapentin (NEURONTIN) 100 MG capsule Take 100 mg by mouth daily.   lactose free nutrition (BOOST) LIQD Take 237 mLs by mouth 2 (two) times daily between meals.   Multiple Vitamin (MULTIVITAMIN WITH MINERALS) TABS Take 1 tablet by mouth every evening.   Omega-3 Fatty Acids (FISH OIL) 1000 MG CAPS Take 1 capsule by mouth 2 (two) times daily.   pantoprazole (PROTONIX) 40 MG tablet TAKE ONE TABLET BY MOUTH DAILY   polyethylene glycol (MIRALAX / GLYCOLAX) 17 g packet Take 17 g by mouth daily as needed for moderate constipation.   potassium chloride SA (KLOR-CON) 20 MEQ tablet TAKE ONE TABLET BY MOUTH DAILY   rosuvastatin (CRESTOR) 10 MG tablet TAKE ONE TABLET AT BEDTIME.   torsemide (DEMADEX) 20 MG tablet Take 20 mg by mouth 4 (four) times a week. Monday, Tuesday, Thursday, and Friday   [DISCONTINUED] carvedilol (COREG) 6.25 MG tablet TAKE ONE TABLET TWICE DAILY AFTER MEALS   No facility-administered encounter medications on file as of  10/26/2021.    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   Right lower lobectomy   Diverticulitis 1999   Femur fracture ([place]) 03/2013   left, non weight bearing for 2 1/2 weeks   GERD (gastroesophageal reflux disease)    Hemorrhoids 1991   HTN (hypertension)    Lymphedema of upper  extremity    right arm   Osteoporosis    PONV (postoperative nausea and vomiting)    Urinary, incontinence, stress female    Vitamin D deficiency disease    Past Surgical History:  Procedure Laterality Date   APPENDECTOMY     BREAST BIOPSY  05/04/1987   Right Dr Annamaria Boots   BREAST IMPLANT REMOVAL Right 06/05/10   BUNIONECTOMY  1988   carcinoid Right    Right lower lobectomy 2017   COLONOSCOPY W/ POLYPECTOMY     EYE SURGERY  5/14 ; 6/14   Cataract Surgery   JOINT REPLACEMENT     2002 left; 2009 right Hip, rt knee 2012   KNEE ARTHROPLASTY Right 2012   [place] Right 01/05/2016   Procedure: RIGHT LOWER LOBE LOBECTOMY;  Surgeon: Melrose Nakayama, MD;  Location: [place];  Service: Thoracic;  Laterality: Right;   MASTECTOMY PARTIAL / LUMPECTOMY W/ AXILLARY LYMPHADENECTOMY  04/05/1987   Right - Dr Annamaria Boots   RESECTION OF MEDIASTINAL MASS N/A 01/05/2016   Procedure: RESECTION OF PLEURAL MASS, right;  Surgeon: Melrose Nakayama, MD;  Location: [place];  Service: Thoracic;  Laterality: N/A;   TONSILLECTOMY     TOTAL HIP ARTHROPLASTY     TUBAL LIGATION Bilateral 1970   VAGINAL HYSTERECTOMY  1977   myomata   VIDEO ASSISTED THORACOSCOPY (VATS)/WEDGE RESECTION Right 01/05/2016   Procedure: RIGHT VIDEO ASSISTED THORACOSCOPY (VATS)/WEDGE RESECTION;  Surgeon: Melrose Nakayama, MD;  Location: [facility];  Service: Thoracic;  Laterality: Right;   Family History  Problem Relation Age of Onset   Colon cancer Mother    Colon cancer Father    Heart disease Father    Social History   Socioeconomic History   Marital status: Widowed    Spouse name: Not on file   Number of children: Not on file   Years of education: Not on file   Highest education level: Not on file  Occupational History   Not on file  Tobacco Use   Smoking status: Never   Smokeless tobacco: Never  Vaping Use   Vaping Use: Never used  Substance and Sexual Activity   Alcohol use: No    Alcohol/week: 0.0 standard  drinks   Drug use: No   Sexual activity: Not Currently    Partners: Male    Birth control/protection: Surgical    Comment: hysterectomy  Other Topics Concern   Not on file  Social History Narrative   Not on file   Social Determinants of Health   Financial Resource Strain: Not on file  Food Insecurity: Not on file  Transportation Needs: Not on file  Physical Activity: Not on file  Stress: Not on file  Social Connections: Not on file    Tobacco Counseling Counseling given: Not Answered   Clinical Intake:  Pre-visit preparation completed: Yes  Pain : 0-10 Pain  Score: 3  Pain Type: Chronic pain Pain Location: Back Pain Orientation: Mid, Lower Pain Descriptors / Indicators: Discomfort (when standing but not when walking) Pain Onset: More than a month ago Pain Frequency:  (everyday) Pain Relieving Factors: Tylenol, heating pad, resting. stopped takign Gabapentin 2/2 sleepiness. Effect of Pain on Daily Activities: limited activies.  Pain Relieving Factors: Tylenol, heating pad, resting. stopped takign Gabapentin 2/2 sleepiness.  BMI - recorded: 26.3 Nutritional Status: BMI 25 -29 Overweight Nutritional Risks: None Diabetes: No  How often do you need to have someone help you when you read instructions, pamphlets, or other written materials from your doctor or pharmacy?: 1 - Never What is the last grade level you completed in school?: one year of college  Diabetic?no  Interpreter Needed?: No  Information entered by :: Anais Koenen Bretta Bang NP   Activities of Daily Living In your present state of health, do you have any difficulty performing the following activities: 10/26/2021  Hearing? Y  Comment hearing aids  Vision? N  Difficulty concentrating or making decisions? N  Walking or climbing stairs? Y  Dressing or bathing? N  Doing errands, shopping? N  Comment driving self to outside doctors' appointment  Preparing Food and eating ? N  Using the Toilet? N  In the past  six months, have you accidently leaked urine? Y  Comment occasional  Do you have problems with loss of bowel control? Y  Comment occasional  Managing your Medications? N  Managing your Finances? N  Housekeeping or managing your Housekeeping? N  Comment house keeping once a month  Some recent data might be hidden    Patient Care Team: Virgie Dad, MD as PCP - General (Internal Medicine) O'Neal, Cassie Freer, MD as PCP - Cardiology (Cardiology) Megan Salon, MD (Gynecology) Eston Esters, MD (Inactive) (Hematology and Oncology) Laurence Spates, MD (Inactive) (Gastroenterology) Jhamal Plucinski X, NP as Nurse Practitioner (Internal Medicine)  Indicate any recent Medical Services you may have received from other than [facility] providers in the past year (date may be approximate).     Assessment:   This is a routine wellness examination for Shamyia.  Hearing/Vision screen No results found.  Dietary issues and exercise activities discussed: Current Exercise Habits: The patient does not participate in regular exercise at present, Exercise limited by: cardiac condition(s);neurologic condition(s);orthopedic condition(s)   Goals Addressed             This Visit's Progress    Maintain Mobility and Function       Evidence-based guidance:  Emphasize the importance of physical activity and aerobic exercise as included in treatment plan; assess barriers to adherence; consider patient's abilities and preferences.  Encourage gradual increase in activity or exercise instead of stopping if pain occurs.  Reinforce individual therapy exercise prescription, such as strengthening, stabilization and stretching programs.  Promote optimal body mechanics to stabilize the spine with lifting and functional activity.  Encourage activity and mobility modifications to facilitate optimal function, such as using a log roll for bed mobility or dressing from a seated position.  Reinforce individual adaptive equipment  recommendations to limit excessive spinal movements, such as a Systems analyst.  Assess adequacy of sleep; encourage use of sleep hygiene techniques, such as bedtime routine; use of white noise; dark, cool bedroom; avoiding daytime naps, heavy meals or exercise before bedtime.  Promote positions and modification to optimize sleep and sexual activity; consider pillows or positioning devices to assist in maintaining neutral spine.  Explore options for applying ergonomic  principles at work and home, such as frequent position changes, using ergonomically designed equipment and working at optimal height.  Promote modifications to increase comfort with driving such as lumbar support, optimizing seat and steering wheel position, using cruise control and taking frequent rest stops to stretch and walk.   Notes:        Depression Screen PHQ 2/9 Scores 10/19/2021  PHQ - 2 Score 0    Fall Risk Fall Risk  10/19/2021 02/09/2021 01/13/2021 09/22/2020 08/04/2020  Falls in the past year? 0 0 0 0 0  Number falls in past yr: 0 0 0 0 0  Injury with Fall? 0 0 - - -  Risk for fall due to : No Fall Risks - - - -  Follow up Falls evaluation completed - - - -    FALL RISK PREVENTION PERTAINING TO THE HOME:  Any stairs in or around the home? Yes  If so, are there any without handrails? No  Home free of loose throw rugs in walkways, pet beds, electrical cords, etc? Yes  Adequate lighting in your home to reduce risk of falls? Yes   ASSISTIVE DEVICES UTILIZED TO PREVENT FALLS:  Life alert? No  Use of a cane, walker or w/c? Yes  Grab bars in the bathroom? Yes  Shower chair or bench in shower? No  Elevated toilet seat or a handicapped toilet? No   TIMED UP AND GO:  Was the test performed? Yes .  Length of time to ambulate 10 feet: 9 sec.   Gait steady and fast with assistive device  Cognitive Function: MMSE - Mini Mental State Exam 10/26/2021 10/26/2021  Orientation to time 5 5  Orientation to Place 5 5   Registration 3 3  Attention/ Calculation 0 0  Recall 3 3  Language- name 2 objects 2 2  Language- repeat 1 1  Language- follow 3 step command 3 3  Language- read & follow direction 1 1  Write a sentence 1 1  Copy design 1 1  Total score 25 25        Immunizations Immunization History  Administered Date(s) Administered   Fluad Quad(high Dose 65+) 07/31/2021   Influenza, High Dose Seasonal PF 07/27/2020   Influenza-Unspecified 06/30/2016   Moderna Sars-Covid-2 Vaccination 10/19/2019, 11/16/2019   Pneumococcal Polysaccharide-23 12/16/2001   Pneumococcal-Unspecified 07/30/2008, 10/22/2014   Td 12/16/2001   Tdap 10/15/2009   Tetanus 05/09/2010   Zoster Recombinat (Shingrix) 04/24/2021    TDAP status: Up to date  Flu Vaccine status: Up to date  Pneumococcal vaccine status: Up to date  Covid-19 vaccine status: Completed vaccines  Qualifies for Shingles Vaccine? Yes   Zostavax completed Yes   Shingrix Completed?: Yes  Screening Tests Health Maintenance  Topic Date Due   Pneumonia Vaccine 64+ Years old (2 - PCV) 10/23/2015   COVID-19 Vaccine (3 - Moderna risk series) 12/14/2019   TETANUS/TDAP  05/09/2020   Zoster Vaccines- Shingrix (2 of 2) 06/19/2021   INFLUENZA VACCINE  Completed   DEXA SCAN  Completed   HPV VACCINES  Aged Out    Health Maintenance  Health Maintenance Due  Topic Date Due   Pneumonia Vaccine 22+ Years old (2 - PCV) 10/23/2015   COVID-19 Vaccine (3 - Moderna risk series) 12/14/2019   TETANUS/TDAP  05/09/2020   Zoster Vaccines- Shingrix (2 of 2) 06/19/2021    Colorectal cancer screening: No longer required.   Mammogram status: Completed 2022. Repeat every year  Bone Density status: Ordered DEXA. Pt  provided with contact info and advised to call to schedule appt.  Lung Cancer Screening: (Low Dose CT Chest recommended if Age 35-80 years, 30 pack-year currently smoking OR have quit w/in 15years.) does not qualify.    Hepatitis C Screening:  does not qualify  Vision Screening: Recommended annual ophthalmology exams for early detection of glaucoma and other disorders of the eye. Is the patient up to date with their annual eye exam?  Yes  Who is the provider or what is the name of the office in which the patient attends annual eye exams? [facility] If pt is not established with a provider, would they like to be referred to a provider to establish care? No .   Dental Screening: Recommended annual dental exams for proper oral hygiene  Community Resource Referral / Chronic Care Management: CRR required this visit?  No   CCM required this visit?  No      Plan:     I have personally reviewed and noted the following in the patients chart:   Medical and social history Use of alcohol, tobacco or illicit drugs  Current medications and supplements including opioid prescriptions.  Functional ability and status Nutritional status Physical activity Advanced directives List of other physicians Hospitalizations, surgeries, and ER visits in previous 12 months Vitals Screenings to include cognitive, depression, and falls Referrals and appointments  In addition, I have reviewed and discussed with patient certain preventive protocols, quality metrics, and best practice recommendations. A written personalized care plan for preventive services as well as general preventive health recommendations were provided to patient.    DEXA ordered.   Reg Bircher X Cathalina Barcia, NP   11/14/2021

## 2021-11-01 NOTE — Progress Notes (Signed)
Cardiology Office Note:   Date:  11/02/2021  NAME:  [name]    MRN: [id number] DOB:  07-28-1934   PCP:  Virgie Dad, MD  Cardiologist:  Evalina Field, MD  Electrophysiologist:  None   Referring MD: Virgie Dad, MD   Chief Complaint  Patient presents with   Follow-up         History of Present Illness:   OLLA [name] is a 88 y.o. female with a hx of permanent Afib, carcinoid lung tumor, HFpEF who presents for follow-up.  She reports she is doing well.  Remains in A. fib.  No issues.  Adequately rate controlled.  Denies any chest pain or trouble breathing.  Diagnosed with arthritis in her back.  Reports she is using Tylenol.  Did not tolerate gabapentin.  She is trying to get more active.  I have recommended physical therapy.  No bleeding on Eliquis.  BP is a bit elevated 150/82 here in the office.  Apparently it is well controlled at home.  She has been cleared regarding her carcinoid lung tumor.  No recurrence.  She will see surgery back as needed.  Overall appears to be doing well.  Sodium remains stable.  Denies any symptoms in the office.  Problem List 1. Permanent Afib -CHADSVASC=4 2. Carcinoid lung tumor  3. Cirrhosis  -on CT A/P 2020 4. Hyponatremia -SIADH? 5. HTN 6. 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   Right lower lobectomy   Diverticulitis 1999   Femur fracture ([place]) 03/2013   left, non weight bearing for 2 1/2 weeks   GERD (gastroesophageal reflux disease)    Hemorrhoids 1991   HTN (hypertension)    Lymphedema of upper extremity    right arm   Osteoporosis    PONV (postoperative nausea and vomiting)    Urinary, incontinence, stress female    Vitamin D deficiency disease      Past Surgical History: Past Surgical History:  Procedure Laterality Date   APPENDECTOMY     BREAST BIOPSY  05/04/1987   Right Dr Annamaria Boots   BREAST IMPLANT REMOVAL Right 06/05/10   BUNIONECTOMY  1988   carcinoid Right    Right lower lobectomy 2017   COLONOSCOPY W/ POLYPECTOMY     EYE SURGERY  5/14 ; 6/14   Cataract Surgery   JOINT REPLACEMENT     2002 left; 2009 right Hip, rt knee 2012   KNEE ARTHROPLASTY Right 2012   [place] Right 01/05/2016   Procedure: RIGHT LOWER LOBE LOBECTOMY;  Surgeon: Melrose Nakayama, MD;  Location: [facility];  Service: Thoracic;  Laterality: Right;   MASTECTOMY PARTIAL / LUMPECTOMY W/ AXILLARY LYMPHADENECTOMY  04/05/1987   Right - Dr Annamaria Boots   RESECTION OF MEDIASTINAL MASS N/A 01/05/2016   Procedure: RESECTION OF PLEURAL MASS, right;  Surgeon: Melrose Nakayama, MD;  Location: [facility];  Service: Thoracic;  Laterality: N/A;   TONSILLECTOMY     TOTAL HIP ARTHROPLASTY     TUBAL LIGATION Bilateral 1970   VAGINAL HYSTERECTOMY  1977   myomata   VIDEO ASSISTED THORACOSCOPY (VATS)/WEDGE RESECTION Right 01/05/2016   Procedure: RIGHT VIDEO ASSISTED THORACOSCOPY (VATS)/WEDGE RESECTION;  Surgeon: Melrose Nakayama, MD;  Location: [facility];  Service: Thoracic;  Laterality: Right;    Current Medications: Current Meds  Medication Sig   acetaminophen (TYLENOL) 500 MG tablet Take 2 tablets (1,000 mg total) by mouth every 6 (six) hours as needed for mild pain.   apixaban (ELIQUIS) 2.5 MG TABS tablet TAKE 1 TABLET BY MOUTH TWICE DAILY.   bisacodyl (DULCOLAX) 5 MG EC tablet Take 1 tablet (5 mg total) by mouth daily as needed for severe constipation.   Calcium Carbonate-Vitamin D 600-400 MG-UNIT tablet Take 1 tablet by mouth daily.   carboxymethylcellulose (REFRESH PLUS) 0.5 % SOLN Place 1 drop into both eyes 2 (two) times daily as needed (for dry eyes).   carvedilol (COREG) 6.25 MG tablet TAKE ONE TABLET TWICE DAILY AFTER MEALS   Ergocalciferol (VITAMIN D2)  10 MCG (400 UNIT) TABS Take 400 Units by mouth daily.   lactose free nutrition (BOOST) LIQD Take 237 mLs by mouth 2 (two) times daily between meals.   Multiple Vitamin (MULTIVITAMIN WITH MINERALS) TABS Take 1 tablet by mouth every evening.   Omega-3 Fatty Acids (FISH OIL) 1000 MG CAPS Take 1 capsule by mouth 2 (two) times daily.   pantoprazole (PROTONIX) 40 MG tablet TAKE ONE TABLET BY MOUTH DAILY   polyethylene glycol (MIRALAX / GLYCOLAX) 17 g packet Take 17 g by mouth daily as needed for moderate constipation.   potassium chloride SA (KLOR-CON) 20 MEQ tablet TAKE ONE TABLET BY MOUTH DAILY   rosuvastatin (CRESTOR) 10 MG tablet TAKE ONE TABLET AT BEDTIME.   torsemide (DEMADEX) 20 MG tablet Take 20 mg by mouth 4 (four) times a week. Monday, Tuesday, Thursday, and Friday     Allergies:    Aspirin   Social History: Social History   Socioeconomic History   Marital status: Widowed    Spouse name: Not on file   Number of children: Not on file   Years of education: Not on file   Highest education level: Not on file  Occupational History   Not on file  Tobacco Use   Smoking status: Never   Smokeless tobacco: Never  Vaping Use   Vaping Use: Never used  Substance and Sexual Activity   Alcohol use: No    Alcohol/week: 0.0 standard drinks   Drug use: No   Sexual activity: Not Currently    Partners: Male    Birth control/protection: Surgical    Comment: hysterectomy  Other Topics Concern   Not on file  Social History Narrative   Not on file   Social Determinants of Health   Financial Resource Strain: Not on file  Food Insecurity: Not on file  Transportation Needs: Not on file  Physical Activity: Not on file  Stress: Not on file  Social Connections: Not on file     Family History: The patient's family history includes Colon cancer in her father and mother; Heart disease in her father.  ROS:   All other ROS reviewed and negative. Pertinent positives noted in the HPI.      EKGs/Labs/Other Studies Reviewed:   The following studies were personally reviewed by me today:  EKG:  EKG is ordered today.  The ekg ordered today demonstrates atrial fibrillation heart rate 66, no acute ischemic changes or evidence of infarction, and was personally reviewed by me.   TTE 06/02/2020  1. Left ventricular ejection fraction, by estimation, is 60 to 65%. The  left ventricle has normal function. The left ventricle has no regional  wall motion abnormalities. There is mild left ventricular hypertrophy.  Left ventricular diastolic parameters  are indeterminate.   2. Right ventricular systolic function is normal. The right ventricular  size is normal. There is normal pulmonary artery systolic pressure.   3. Left atrial size was moderately dilated.   4. Right atrial size was severely dilated.   5. The mitral valve is abnormal. Mild mitral valve regurgitation.   6. Tricuspid valve regurgitation is mild to moderate.   7. The aortic valve is abnormal. Aortic valve regurgitation is mild. Mild  aortic valve sclerosis is present, with no evidence of aortic valve  stenosis.   Recent Labs: 02/02/2021: TSH 1.98 10/17/2021: ALT 12; BUN 19; Creat 1.14; Hemoglobin 12.1; Platelets 189; Potassium 4.8; Sodium 131   Recent Lipid Panel    Component Value Date/Time   CHOL 119 01/05/2021 0715   TRIG 89 01/05/2021 0715   HDL 50 01/05/2021 0715   CHOLHDL 2.4 01/05/2021 0715   VLDL 10 04/09/2007 0915   LDLCALC 52 01/05/2021 0715   LDLDIRECT 107.8 04/09/2007 0915    Physical Exam:   VS:  BP (!) 150/82 (BP Location: Left Arm, Patient Position: Sitting, Cuff Size: Normal)    Pulse 66    Ht 4\' 11"  (1.499 m)    Wt 127 lb 9.6 oz (57.9 kg)    LMP 10/16/1975 (Approximate)    SpO2 94%    BMI 25.77 kg/m    Wt Readings from Last 3 Encounters:  11/02/21 127 lb 9.6 oz (57.9 kg)  10/26/21 130 lb 3.2 oz (59.1 kg)  10/19/21 131 lb 6.4 oz (59.6 kg)    General: Well nourished, well developed, in no acute  distress Head: Atraumatic, normal size  Eyes: PEERLA, EOMI  Neck: Supple, no JVD Endocrine: No thryomegaly Cardiac: Normal S1, S2; irregular rhythm, no murmurs rubs or gallops Lungs: Clear to auscultation bilaterally, no wheezing, rhonchi or rales  Abd: Soft, nontender, no hepatomegaly  Ext: No edema, pulses 2+ Musculoskeletal: No deformities, BUE and BLE strength normal and equal Skin: Warm and dry, no rashes   Neuro: Alert and oriented to person, place, time, and situation, CNII-XII grossly intact, no focal deficits  Psych: Normal mood and affect   ASSESSMENT:   [name] is a 88 y.o. female who presents for the following: 1. Chronic diastolic heart failure ([place])   2. Permanent atrial fibrillation ([place])   3. Acquired thrombophilia ([place])   4. Essential hypertension     PLAN:   1. Chronic diastolic heart failure (H[facility]) -Euvolemic on exam.  Denies any symptoms.  Continue torsemide 20 mg Monday Tuesday Thursday and Friday.  This is closely monitored by nephrology.  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  2. Permanent atrial fibrillation ([place]) 3. Acquired thrombophilia (H[facility]) -Permanent A. fib.  Rate control strategy recommended.  No symptoms from this.  Continue Coreg 6.25 mg twice daily.  On Eliquis 2.5 mg twice daily.  Weight is less than 60 kg and age is above 60.  She will continue on this dose.  No major bleeding issues.  4. Essential hypertension -Well-controlled at home.  Continue current medications.  Disposition: Return in about 1 year (around 11/02/2022).  Medication Adjustments/Labs and Tests Ordered: Current medicines are reviewed at length with the patient today.  Concerns regarding medicines are outlined above.  Orders Placed This Encounter  Procedures   EKG 12-Lead   No orders of the defined types were placed in this encounter.   Patient Instructions  Medication  Instructions:  The current medical regimen is effective;  continue present plan and  medications.  *If you need a refill on your cardiac medications before your next appointment, please call your pharmacy*  Follow-Up: At [facility], you and your health needs are our priority.  As part of our continuing mission to provide you with exceptional heart care, we have created designated Provider Care Teams.  These Care Teams include your primary Cardiologist (physician) and Advanced Practice Providers (APPs -  Physician Assistants and Nurse Practitioners) who all work together to provide you with the care you need, when you need it.  We recommend signing up for the patient portal called "MyChart".  Sign up information is provided on this After Visit Summary.  MyChart is used to connect with patients for Virtual Visits (Telemedicine).  Patients are able to view lab/test results, encounter notes, upcoming appointments, etc.  Non-urgent messages can be sent to your provider as well.   To learn more about what you can do with MyChart, go to [email or web address].    Your next appointment:   12 month(s)  The format for your next appointment:   In Person  Provider:   Evalina Field, MD       Time Spent with Patient: I have spent a total of 25 minutes with patient reviewing hospital notes, telemetry, EKGs, labs and examining the patient as well as establishing an assessment and plan that was discussed with the patient.  > 50% of time was spent in direct patient care.  Signed, Addison Naegeli. Audie Box, MD, [place]  [address], [place], [place] [address] [phone]  11/02/2021 1:47 PM

## 2021-11-02 ENCOUNTER — Encounter: Payer: Self-pay | Admitting: Cardiovascular Disease

## 2021-11-02 ENCOUNTER — Ambulatory Visit (INDEPENDENT_AMBULATORY_CARE_PROVIDER_SITE_OTHER): Payer: Medicare Other | Admitting: Cardiovascular Disease

## 2021-11-02 ENCOUNTER — Other Ambulatory Visit: Payer: Self-pay

## 2021-11-02 VITALS — BP 150/82 | HR 66 | Ht 59.0 in | Wt 127.6 lb

## 2021-11-02 DIAGNOSIS — D6869 Other thrombophilia: Secondary | ICD-10-CM | POA: Diagnosis not present

## 2021-11-02 DIAGNOSIS — I10 Essential (primary) hypertension: Secondary | ICD-10-CM | POA: Diagnosis not present

## 2021-11-02 DIAGNOSIS — I5032 Chronic diastolic (congestive) heart failure: Secondary | ICD-10-CM

## 2021-11-02 DIAGNOSIS — I4821 Permanent atrial fibrillation: Secondary | ICD-10-CM | POA: Diagnosis not present

## 2021-11-02 NOTE — Patient Instructions (Signed)
Medication Instructions:  °The current medical regimen is effective;  continue present plan and medications. ° °*If you need a refill on your cardiac medications before your next appointment, please call your pharmacy* ° ° °Follow-Up: °At [facility], you and your health needs are our priority.  As part of our continuing mission to provide you with exceptional heart care, we have created designated Provider Care Teams.  These Care Teams include your primary Cardiologist (physician) and Advanced Practice Providers (APPs -  Physician Assistants and Nurse Practitioners) who all work together to provide you with the care you need, when you need it. ° °We recommend signing up for the patient portal called "MyChart".  Sign up information is provided on this After Visit Summary.  MyChart is used to connect with patients for Virtual Visits (Telemedicine).  Patients are able to view lab/test results, encounter notes, upcoming appointments, etc.  Non-urgent messages can be sent to your provider as well.   °To learn more about what you can do with MyChart, go to [email or web address].   ° °Your next appointment:   °12 month(s) ° °The format for your next appointment:   °In Person ° °Provider:   °[place] T O'Neal, MD   ° ° ° °

## 2021-11-09 ENCOUNTER — Other Ambulatory Visit: Payer: Self-pay | Admitting: Internal Medicine

## 2021-11-13 ENCOUNTER — Ambulatory Visit: Payer: Medicare Other | Admitting: Obstetrics and Gynecology

## 2021-11-14 NOTE — Addendum Note (Signed)
Addended by: Quinten Allerton X on: 11/14/2021 10:16 AM   Modules accepted: Orders, Level of Service

## 2021-12-05 ENCOUNTER — Other Ambulatory Visit: Payer: Medicare Other

## 2021-12-07 ENCOUNTER — Encounter: Payer: Medicare Other | Admitting: Nurse Practitioner

## 2021-12-27 ENCOUNTER — Other Ambulatory Visit: Payer: Self-pay | Admitting: Cardiovascular Disease

## 2022-01-18 LAB — HM MAMMOGRAPHY

## 2022-01-22 ENCOUNTER — Encounter: Payer: Self-pay | Admitting: *Deleted

## 2022-01-29 ENCOUNTER — Emergency Department (HOSPITAL_COMMUNITY): Payer: Medicare Other

## 2022-01-29 ENCOUNTER — Other Ambulatory Visit: Payer: Self-pay

## 2022-01-29 ENCOUNTER — Encounter (HOSPITAL_COMMUNITY): Payer: Self-pay

## 2022-01-29 ENCOUNTER — Emergency Department (HOSPITAL_COMMUNITY)
Admission: EM | Admit: 2022-01-29 | Discharge: 2022-01-29 | Disposition: A | Discharge status: Home / Self Care | Payer: Medicare Other | Attending: Emergency Medicine | Admitting: Emergency Medicine

## 2022-01-29 DIAGNOSIS — R0600 Dyspnea, unspecified: Secondary | ICD-10-CM

## 2022-01-29 DIAGNOSIS — I509 Heart failure, unspecified: Secondary | ICD-10-CM | POA: Diagnosis not present

## 2022-01-29 DIAGNOSIS — I4891 Unspecified atrial fibrillation: Secondary | ICD-10-CM | POA: Insufficient documentation

## 2022-01-29 DIAGNOSIS — Z7901 Long term (current) use of anticoagulants: Secondary | ICD-10-CM | POA: Insufficient documentation

## 2022-01-29 DIAGNOSIS — Z20822 Contact with and (suspected) exposure to covid-19: Secondary | ICD-10-CM | POA: Insufficient documentation

## 2022-01-29 DIAGNOSIS — R0602 Shortness of breath: Secondary | ICD-10-CM | POA: Diagnosis not present

## 2022-01-29 DIAGNOSIS — R051 Acute cough: Secondary | ICD-10-CM | POA: Diagnosis not present

## 2022-01-29 DIAGNOSIS — R059 Cough, unspecified: Secondary | ICD-10-CM | POA: Diagnosis present

## 2022-01-29 LAB — COMPREHENSIVE METABOLIC PANEL
ALT: 14 U/L (ref 0–44)
AST: 36 U/L (ref 15–41)
Albumin: 3.9 g/dL (ref 3.5–5.0)
Alkaline Phosphatase: 65 U/L (ref 38–126)
Anion gap: 11 (ref 5–15)
BUN: 16 mg/dL (ref 8–23)
CO2: 27 mmol/L (ref 22–32)
Calcium: 9.6 mg/dL (ref 8.9–10.3)
Chloride: 99 mmol/L (ref 98–111)
Creatinine, Ser: 0.99 mg/dL (ref 0.44–1.00)
GFR, Estimated: 56 mL/min — ABNORMAL LOW (ref >60)
Glucose, Bld: 100 mg/dL — ABNORMAL HIGH (ref 70–99)
Potassium: 4.7 mmol/L (ref 3.5–5.1)
Sodium: 137 mmol/L (ref 135–145)
Total Bilirubin: 1.6 mg/dL — ABNORMAL HIGH (ref 0.3–1.2)
Total Protein: 7.5 g/dL (ref 6.5–8.1)

## 2022-01-29 LAB — BRAIN NATRIURETIC PEPTIDE: B Natriuretic Peptide: 312.8 pg/mL — ABNORMAL HIGH (ref 0.0–100.0)

## 2022-01-29 LAB — RESP PANEL BY RT-PCR (FLU A&B, COVID) ARPGX2
Influenza A by PCR: NEGATIVE
Influenza B by PCR: NEGATIVE
SARS Coronavirus 2 by RT PCR: NEGATIVE

## 2022-01-29 LAB — CBC WITH DIFFERENTIAL/PLATELET
Abs Immature Granulocytes: 0.03 10*3/uL (ref 0.00–0.07)
Basophils Absolute: 0 10*3/uL (ref 0.0–0.1)
Basophils Relative: 1 %
Eosinophils Absolute: 0.1 10*3/uL (ref 0.0–0.5)
Eosinophils Relative: 1 %
HCT: 39.7 % (ref 36.0–46.0)
Hemoglobin: 12.4 g/dL (ref 12.0–15.0)
Immature Granulocytes: 1 %
Lymphocytes Relative: 14 %
Lymphs Abs: 0.7 10*3/uL (ref 0.7–4.0)
MCH: 32.3 pg (ref 26.0–34.0)
MCHC: 31.2 g/dL (ref 30.0–36.0)
MCV: 103.4 fL — ABNORMAL HIGH (ref 80.0–100.0)
Monocytes Absolute: 0.5 10*3/uL (ref 0.1–1.0)
Monocytes Relative: 9 %
Neutro Abs: 3.6 10*3/uL (ref 1.7–7.7)
Neutrophils Relative %: 74 %
Platelets: 178 10*3/uL (ref 150–400)
RBC: 3.84 MIL/uL — ABNORMAL LOW (ref 3.87–5.11)
RDW: 14.1 % (ref 11.5–15.5)
WBC: 4.9 10*3/uL (ref 4.0–10.5)
nRBC: 0 % (ref 0.0–0.2)

## 2022-01-29 LAB — TROPONIN I (HIGH SENSITIVITY)
Troponin I (High Sensitivity): 14 ng/L (ref <18)
Troponin I (High Sensitivity): 16 ng/L (ref <18)

## 2022-01-29 MED ORDER — IOHEXOL 350 MG/ML SOLN
100.0000 mL | Freq: Once | INTRAVENOUS | Status: AC | PRN
  Administered 2022-01-29: 100 mL via INTRAVENOUS

## 2022-01-29 MED ORDER — FUROSEMIDE 10 MG/ML IJ SOLN: 40.0000 mg | Freq: Once | INTRAMUSCULAR | Status: DC

## 2022-01-29 MED ORDER — DOXYCYCLINE HYCLATE 100 MG PO CAPS: 100.0000 mg | ORAL_CAPSULE | Freq: Two times a day (BID) | ORAL | 0 refills | Status: AC

## 2022-01-29 MED ORDER — DOXYCYCLINE HYCLATE 100 MG PO TABS
100.0000 mg | ORAL_TABLET | Freq: Once | ORAL | Status: AC
  Administered 2022-01-29: 100 mg via ORAL
  Filled 2022-01-29: qty 1

## 2022-01-29 MED ORDER — FUROSEMIDE 8 MG/ML PO SOLN
40.0000 mg | Freq: Once | ORAL | Status: DC
  Filled 2022-01-29: qty 5

## 2022-01-29 NOTE — ED Triage Notes (Addendum)
Pt BIB EMS for SOB.  Pt wears 02 at night but not during day.  Pt requiring 2L 02 to maintain 93-100% 02 sat.  Pt has been experiencing worsening SOB with exertion recently.  PT states nausea has been chronic and intermittent for months.  Pt is A&O x4 ? ?

## 2022-01-29 NOTE — ED Provider Notes (Signed)
?[facility] ?Provider Note ? ? ?CSN: [id number] ?Arrival date & time: 01/29/22  1530 ? ?  ? ?History ? ?Chief Complaint  ?Patient presents with  ? Shortness of Breath  ? Nausea  ? ? ?[name] is a 88 y.o. female. ? ?Patient complains of recent increase in shortness of breath patient reports she is coughing up a lot of phlegm and has had some difficulty getting the phlegm out and feeling like she is choking on it.  Patient complains of feeling like she may have an upper respiratory infection.  Patient is on home O2 at night but has been having to use her O2 during the day as well due to shortness of breath patient reports she feels more short of breath if she is exerting herself.  Patient denies having any fever or chills she is coughing.  Patient denies any swelling in her extremities.  Patient has a history of bronchial adenoma of the lung and lung cancer.  Has a past medical history of atrial fibrillation she is on anticoagulation.  Denies having missed any doses of Eliquis.  Patient takes torsemide for congestive heart failure she states that she has not missed any doses of her furosemide ? ?The history is provided by the patient. No language interpreter was used.  ?Shortness of Breath ?Severity:  Moderate ?Onset quality:  Gradual ?Duration:  2 weeks ?Timing:  Constant ?Progression:  Worsening ?Chronicity:  New ?Context: not URI   ?Relieved by:  Nothing ?Worsened by:  Nothing ?Ineffective treatments:  None tried ?Associated symptoms: cough   ?Associated symptoms: no abdominal pain   ?Risk factors: no recent alcohol use and no hx of PE/DVT   ? ?  ? ?Home Medications ?Prior to Admission medications   ?Medication Sig Start Date End Date Taking? Authorizing Provider  ?acetaminophen (TYLENOL) 500 MG tablet Take 2 tablets (1,000 mg total) by mouth every 6 (six) hours as needed for mild pain. 01/17/16   Nani Skillern, PA-C  ?apixaban (ELIQUIS) 2.5 MG TABS tablet TAKE 1  TABLET BY MOUTH TWICE DAILY. 08/23/21   O'NealCassie Freer, MD  ?bisacodyl (DULCOLAX) 5 MG EC tablet Take 1 tablet (5 mg total) by mouth daily as needed for severe constipation. 06/08/20   Pokhrel, Corrie Mckusick, MD  ?Calcium Carbonate-Vitamin D 600-400 MG-UNIT tablet Take 1 tablet by mouth daily.    [provider]  ?carboxymethylcellulose (REFRESH PLUS) 0.5 % SOLN Place 1 drop into both eyes 2 (two) times daily as needed (for dry eyes).    [provider]  ?carvedilol (COREG) 6.25 MG tablet TAKE ONE TABLET BY MOUTH TWICE DAILY AFTER MEALS 11/09/21   Virgie Dad, MD  ?Ergocalciferol (VITAMIN D2) 10 MCG (400 UNIT) TABS Take 400 Units by mouth daily. 07/15/20   Mast, Man X, NP  ?gabapentin (NEURONTIN) 100 MG capsule Take 100 mg by mouth daily.    [provider]  ?lactose free nutrition (BOOST) LIQD Take 237 mLs by mouth 2 (two) times daily between meals.    [provider]  ?Multiple Vitamin (MULTIVITAMIN WITH MINERALS) TABS Take 1 tablet by mouth every evening.    [provider]  ?Omega-3 Fatty Acids (FISH OIL) 1000 MG CAPS Take 1 capsule by mouth 2 (two) times daily.    [provider]  ?pantoprazole (PROTONIX) 40 MG tablet TAKE ONE TABLET BY MOUTH DAILY 09/19/21   Mast, Man X, NP  ?polyethylene glycol (MIRALAX / GLYCOLAX) 17 g packet Take 17 g  by mouth daily as needed for moderate constipation. 07/15/20   Mast, Man X, NP  ?potassium chloride SA (KLOR-CON) 20 MEQ tablet TAKE ONE TABLET BY MOUTH DAILY 08/09/21   Virgie Dad, MD  ?rosuvastatin (CRESTOR) 10 MG tablet TAKE ONE TABLET AT BEDTIME. 12/28/21   O'Neal, Cassie Freer, MD  ?torsemide (DEMADEX) 20 MG tablet Take 20 mg by mouth 4 (four) times a week. Monday, Tuesday, Thursday, and Friday    [provider]  ?   ? ?Allergies    ?Aspirin   ? ?Review of Systems   ?Review of Systems  ?Respiratory:  Positive for cough and shortness of breath.   ?Gastrointestinal:  Negative for abdominal pain.  ?All other  systems reviewed and are negative. ? ?Physical Exam ?Updated Vital Signs ?BP 140/79   Pulse 85   Temp 97.7 ?F (36.5 ?C) (Oral)   Resp (!) 24   Ht 4\' 11"  (1.499 m)   Wt 56.7 kg   LMP 10/16/1975 (Approximate)   SpO2 98%   BMI 25.25 kg/m?  ?Physical Exam ?Vitals and nursing note reviewed.  ?Constitutional:   ?   Appearance: She is well-developed.  ?HENT:  ?   Head: Normocephalic.  ?Cardiovascular:  ?   Rate and Rhythm: Normal rate. Rhythm irregular.  ?Pulmonary:  ?   Effort: Pulmonary effort is normal.  ?   Breath sounds: Decreased breath sounds and rhonchi present.  ?Abdominal:  ?   General: Bowel sounds are normal. There is no distension.  ?   Palpations: Abdomen is soft.  ?Musculoskeletal:     ?   General: Normal range of motion.  ?   Cervical back: Normal range of motion.  ?Skin: ?   General: Skin is warm.  ?Neurological:  ?   Mental Status: She is alert and oriented to person, place, and time.  ?Psychiatric:     ?   Mood and Affect: Mood normal.  ? ? ?ED Results / Procedures / Treatments   ?Labs ?(all labs ordered are listed, but only abnormal results are displayed) ?Labs Reviewed  ?CBC WITH DIFFERENTIAL/PLATELET - Abnormal; Notable for the following components:  ?    Result Value  ? RBC 3.84 (*)   ? MCV 103.4 (*)   ? All other components within normal limits  ?COMPREHENSIVE METABOLIC PANEL - Abnormal; Notable for the following components:  ? Glucose, Bld 100 (*)   ? Total Bilirubin 1.6 (*)   ? GFR, Estimated 56 (*)   ? All other components within normal limits  ?BRAIN NATRIURETIC PEPTIDE - Abnormal; Notable for the following components:  ? B Natriuretic Peptide 312.8 (*)   ? All other components within normal limits  ?RESP PANEL BY RT-PCR (FLU A&B, COVID) [id number]  ?TROPONIN I (HIGH SENSITIVITY)  ?TROPONIN I (HIGH SENSITIVITY)  ? ? ?EKG ?EKG Interpretation ? ?Date/Time:  Monday January 29 2022 15:41:23 EDT ?Ventricular Rate:  77 ?PR Interval:    ?QRS Duration: 85 ?QT Interval:  398 ?QTC Calculation: 451 ?R  Axis:   44 ?Text Interpretation: Atrial fibrillation RSR' in V1 or V2, right VCD or RVH Nonspecific T abnrm, anterolateral leads Confirmed by Octaviano Glow [phone]) on 01/29/2022 3:59:59 PM ? ?Radiology ?DG Chest Port 1 View ? ?Result Date: 01/29/2022 ?CLINICAL DATA:  Cough, shortness of breath EXAM: PORTABLE CHEST 1 VIEW COMPARISON:  05/30/2020 FINDINGS: Transverse diameter of heart is increased. There are no signs of pulmonary edema or focal pulmonary consolidation. There is blunting of right lateral CP angle. There  is interval decrease in right pleural effusion. Surgical clips are noted in the right axilla. IMPRESSION: Cardiomegaly. There are no signs of pulmonary edema or focal pulmonary consolidation. Blunting of right lateral CP angle may suggest small effusion or pleural thickening. Electronically Signed   By: Elmer Picker M.D.   On: 01/29/2022 16:01   ? ?Procedures ?Procedures  ? ? ?Medications Ordered in ED ?Medications  ?furosemide (LASIX) 8 MG/ML solution 40 mg (has no administration in time range)  ? ? ?ED Course/ Medical Decision Making/ A&P ?Clinical Course as of 01/29/22 1837  ?Mon Jan 29, 2022  ?[age] yo female w/ mild CHF, A Fib on eliquis, on torsmemide 4 days per week, here from assisted living facility with SOB, worsening over the past 3-4 days, noting dyspnea on exertion.  No chest pain.  She has chronic cough.  Wears O2 at night only for desats while sleeping.  No hx of smoking or COPD.  Hx of right lobectomy partial for pulm mass.  ECG and telemtry show rate controlled A Fib without acute ischemia.  Xray with no evident PNA.  Pending labs, Covid swab, hgb check, BNP check.  She has some coarse crackles in lower lobes on exam - possible CHF (?).  Minimal peripheral edema.  No tachypnea or respiratory distress on exam.  Son at bedside. [MT]  ?  ?Clinical Course User Index ?[MT] Wyvonnia Dusky, MD  ? ?                        ?Medical Decision Making ?Patient reports increasing  shortness of breath she is normally on O2 at night but has been having to use her O2 during the day ? ?Amount and/or Complexity of Data Reviewed ?External Data Reviewed: notes. ?   Details: Notes from [employer] car

## 2022-01-29 NOTE — ED Notes (Signed)
All discharge instructions reviewed with patient including follow up care and prescriptions. Patient stable at time of discharge.  ?

## 2022-01-29 NOTE — Discharge Instructions (Addendum)
Instructions: ? ?I recommend that you increase the dosing of your torsemide to once daily for seven days, then go back to your normal regimen. ?We are treating you with 7 days of an antibiotic for possible pneumonia.  It is not clear whether there is truly pneumonia from your CT scan.  Your blood work was reassuring.  I thought it was safest to treat you with antibiotics.  It is very important that you follow-up with her primary care provider by the end of the week to look at your vital signs, oxygen number, and see how you are feeling. ?

## 2022-01-29 NOTE — ED Notes (Signed)
Pt ambulated to restroom with 1 assist.  Pt uses walker at home.  Pt 02 sat was 89% on RA after ambulating to restroom.  PT is 100% on 2L.  PT is A&O x4. Family at bedside. ?

## 2022-01-29 NOTE — ED Notes (Signed)
Pt ambulated to restroom with assistance. Pt maintained a steady gait. ?

## 2022-01-29 NOTE — ED Notes (Signed)
Patient transported to CT 

## 2022-01-30 ENCOUNTER — Other Ambulatory Visit: Payer: Self-pay | Admitting: Internal Medicine

## 2022-02-07 ENCOUNTER — Non-Acute Institutional Stay: Payer: Medicare Other | Admitting: Family Medicine

## 2022-02-07 ENCOUNTER — Encounter: Payer: Self-pay | Admitting: Family Medicine

## 2022-02-07 VITALS — BP 132/78 | HR 84 | Temp 98.7°F | Ht 59.0 in | Wt 127.2 lb

## 2022-02-07 DIAGNOSIS — R1011 Right upper quadrant pain: Secondary | ICD-10-CM

## 2022-02-07 DIAGNOSIS — R110 Nausea: Secondary | ICD-10-CM | POA: Diagnosis not present

## 2022-02-07 DIAGNOSIS — I482 Chronic atrial fibrillation, unspecified: Secondary | ICD-10-CM

## 2022-02-07 DIAGNOSIS — R140 Abdominal distension (gaseous): Secondary | ICD-10-CM | POA: Diagnosis not present

## 2022-02-07 MED ORDER — SIMETHICONE 80 MG PO CHEW
80.0000 mg | CHEWABLE_TABLET | Freq: Four times a day (QID) | ORAL | 0 refills | Status: AC | PRN
Start: 2022-02-07 — End: ?

## 2022-02-07 MED ORDER — ONDANSETRON HCL 4 MG PO TABS: 4.0000 mg | ORAL_TABLET | Freq: Three times a day (TID) | ORAL | 0 refills | Status: DC | PRN

## 2022-02-07 NOTE — Progress Notes (Signed)
? ? ?Provider:  ?Alain Honey, MD ? ?Careteam: ?Patient Care Team: ?Virgie Dad, MD as PCP - General (Internal Medicine) ?Geralynn Rile, MD as PCP - Cardiology (Cardiology) ?Megan Salon, MD (Gynecology) ?Eston Esters, MD (Inactive) (Hematology and Oncology) ?Laurence Spates, MD (Inactive) (Gastroenterology) ?Mast, Man X, NP as Nurse Practitioner (Internal Medicine) ? ?PLACE OF SERVICE:  ?[facility]  ?Advanced Directive information ?  ? ?Allergies  ?Allergen Reactions  ? Aspirin Rash  ? ? ?Chief Complaint  ?Patient presents with  ? Acute Visit  ?  Patient presents today for nausea, gas/ bloating and right side pressure for several weeks now. She reports resting/ laying in bed relieves symptoms. She reports taking Protonix 40 mg daily for 2-3 years now.  ? ? ? ?HPI: Patient is a 88 y.o. female p avoid spicy foods and fatty foods.  atient presents with abdominal discomfort and bloating and nausea.  Symptoms of been present for several weeks.  Seems to improve when she is laying in bed.  She also describes some right-sided pressure.  Her gallbladder is intact.  Had recent CT scan of her lungs which did not document any gallstones or gallbladder disease but did show some hepatic cyst.  She does take Protonix every morning for the past 2 to 3 months. ?She does endorse eating more melons in the dining room here as a source of fruit and could also be related to increased gas.  Generally she tries to ? ?Review of Systems:  ?Review of Systems  ?Respiratory: Negative.    ?Cardiovascular: Negative.   ?Gastrointestinal:  Positive for abdominal pain.  ?Genitourinary: Negative.   ?All other systems reviewed and are negative. ? ?Past Medical History:  ?Diagnosis Date  ? Arthritis 1996  ? Atrial fibrillation ([place])   ? on coumadin  ? Atrial tachycardia ([place])   ? Cancer [facility]) 1988  ? breast cancer, R mastectomy, chemo  ? 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 Right lower lobectomy  ?  Diverticulitis 1999  ? Femur fracture ([place]) 03/2013  ? left, non weight bearing for 2 1/2 weeks  ? GERD (gastroesophageal reflux disease)   ? Hemorrhoids 1991  ? HTN (hypertension)   ? Lymphedema of upper extremity   ? right arm  ? Osteoporosis   ? PONV (postoperative nausea and vomiting)   ? Urinary, incontinence, stress female   ? Vitamin D deficiency disease   ? ?Past Surgical History:  ?Procedure Laterality Date  ? APPENDECTOMY    ? BREAST BIOPSY  05/04/1987  ? Right Dr Annamaria Boots  ? BREAST IMPLANT REMOVAL Right 06/05/10  ? BUNIONECTOMY  1988  ? carcinoid Right   ? Right lower lobectomy 2017  ? COLONOSCOPY W/ POLYPECTOMY    ? EYE SURGERY  5/14 ; 6/14  ? Cataract Surgery  ? JOINT REPLACEMENT    ? 2002 left; 2009 right Hip, rt knee 2012  ? KNEE ARTHROPLASTY Right 2012  ? [name]  ? LOBECTOMY Right 01/05/2016  ? Procedure: RIGHT LOWER LOBE LOBECTOMY;  Surgeon: Melrose Nakayama, MD;  Location: [place];  Service: Thoracic;  Laterality: Right;  ? MASTECTOMY PARTIAL / LUMPECTOMY W/ AXILLARY LYMPHADENECTOMY  04/05/1987  ? Right - Dr Annamaria Boots  ? RESECTION OF MEDIASTINAL MASS N/A 01/05/2016  ? Procedure: RESECTION OF PLEURAL MASS, right;  Surgeon: Melrose Nakayama, MD;  Location: [place];  Service: Thoracic;  Laterality: N/A;  ? TONSILLECTOMY    ? TOTAL HIP ARTHROPLASTY    ?  TUBAL LIGATION Bilateral 1970  ? VAGINAL HYSTERECTOMY  1977  ? myomata  ? VIDEO ASSISTED THORACOSCOPY (VATS)/WEDGE RESECTION Right 01/05/2016  ? Procedure: RIGHT VIDEO ASSISTED THORACOSCOPY (VATS)/WEDGE RESECTION;  Surgeon: Melrose Nakayama, MD;  Location: [place];  Service: Thoracic;  Laterality: Right;  ? ?Social History: ?  reports that she has never smoked. She has never used smokeless tobacco. She reports that she does not drink alcohol and does not use drugs. ? ?Family History  ?Problem Relation Age of Onset  ? Colon cancer Mother   ? Colon cancer Father   ? Heart disease Father   ? ? ?Medications: ?Patient's Medications  ?New Prescriptions  ? No  medications on file  ?Previous Medications  ? ACETAMINOPHEN (TYLENOL) 500 MG TABLET    Take 2 tablets (1,000 mg total) by mouth every 6 (six) hours as needed for mild pain.  ? APIXABAN (ELIQUIS) 2.5 MG TABS TABLET    TAKE 1 TABLET BY MOUTH TWICE DAILY.  ? BISACODYL (DULCOLAX) 5 MG EC TABLET    Take 1 tablet (5 mg total) by mouth daily as needed for severe constipation.  ? CALCIUM CARBONATE-VITAMIN D 600-400 MG-UNIT TABLET    Take 1 tablet by mouth daily.  ? CARBOXYMETHYLCELLULOSE (REFRESH PLUS) 0.5 % SOLN    Place 1 drop into both eyes 2 (two) times daily as needed (for dry eyes).  ? CARVEDILOL (COREG) 6.25 MG TABLET    TAKE ONE TABLET BY MOUTH TWICE DAILY AFTER MEALS  ? ERGOCALCIFEROL (VITAMIN D2) 10 MCG (400 UNIT) TABS    Take 400 Units by mouth daily.  ? LACTOSE FREE NUTRITION (BOOST) LIQD    Take 237 mLs by mouth 2 (two) times daily between meals.  ? MULTIPLE VITAMIN (MULTIVITAMIN WITH MINERALS) TABS    Take 1 tablet by mouth every evening.  ? OMEGA-3 FATTY ACIDS (FISH OIL) 1000 MG CAPS    Take 1 capsule by mouth 2 (two) times daily.  ? PANTOPRAZOLE (PROTONIX) 40 MG TABLET    TAKE ONE TABLET BY MOUTH DAILY  ? POLYETHYLENE GLYCOL (MIRALAX / GLYCOLAX) 17 G PACKET    Take 17 g by mouth daily as needed for moderate constipation.  ? POTASSIUM CHLORIDE SA (KLOR-CON M) 20 MEQ TABLET    TAKE ONE TABLET BY MOUTH ONCE DAILY  ? ROSUVASTATIN (CRESTOR) 10 MG TABLET    TAKE ONE TABLET AT BEDTIME.  ? TORSEMIDE (DEMADEX) 20 MG TABLET    Take 20 mg by mouth 4 (four) times a week. Monday, Tuesday, Thursday, and Friday  ?Modified Medications  ? No medications on file  ?Discontinued Medications  ? GABAPENTIN (NEURONTIN) 100 MG CAPSULE    Take 100 mg by mouth daily.  ? ? ?Physical Exam: ? ?Vitals:  ? 02/07/22 1501  ?BP: 132/78  ?Pulse: 84  ?Temp: 98.7 ?F (37.1 ?C)  ?SpO2: 95%  ?Weight: 127 lb 3.2 oz (57.7 kg)  ?Height: 4\' 11"  (1.499 m)  ? ?Body mass index is 25.69 kg/m?. ?Wt Readings from Last 3 Encounters:  ?02/07/22 127 lb 3.2 oz  (57.7 kg)  ?01/29/22 125 lb (56.7 kg)  ?11/02/21 127 lb 9.6 oz (57.9 kg)  ? ? ?Physical Exam ?Vitals and nursing note reviewed.  ?Constitutional:   ?   Appearance: Normal appearance.  ?Cardiovascular:  ?   Rate and Rhythm: Normal rate.  ?Pulmonary:  ?   Effort: Pulmonary effort is normal.  ?Abdominal:  ?   General: Bowel sounds are normal. There is distension.  ?  Palpations: There is no mass.  ?Neurological:  ?   General: No focal deficit present.  ?   Mental Status: She is alert and oriented to person, place, and time.  ? ? ?Labs reviewed: ?Basic Metabolic Panel: ?Recent Labs  ?  05/31/21 ?4650 10/17/21 ?0710 01/29/22 ?3546  ?NA 142 131* 137  ?K 4.6 4.8 4.7  ?CL 105 96* 99  ?CO2 29 26 27   ?GLUCOSE 81 99 100*  ?BUN 33* 19 16  ?CREATININE 1.25* 1.14* 0.99  ?CALCIUM 9.6 9.5 9.6  ? ?Liver Function Tests: ?Recent Labs  ?  05/31/21 ?0844 10/17/21 ?0710 01/29/22 ?1631  ?AST 17 28 36  ?ALT 10 12 14   ?ALKPHOS  --   --  65  ?BILITOT 1.1 0.9 1.6*  ?PROT 7.0 7.0 7.5  ?ALBUMIN  --   --  3.9  ? ?No results for input(s): LIPASE, AMYLASE in the last 8760 hours. ?No results for input(s): AMMONIA in the last 8760 hours. ?CBC: ?Recent Labs  ?  05/31/21 ?0844 10/17/21 ?0710 01/29/22 ?1631  ?WBC 3.4* 4.6 4.9  ?NEUTROABS 1,969 3,206 3.6  ?HGB 11.8 12.1 12.4  ?HCT 36.9 37.2 39.7  ?MCV 98.4 97.9 103.4*  ?PLT 145 189 178  ? ?Lipid Panel: ?No results for input(s): CHOL, HDL, LDLCALC, TRIG, CHOLHDL, LDLDIRECT in the last 8760 hours. ?TSH: ?No results for input(s): TSH in the last 8760 hours. ?A1C: ?No results found for: HGBA1C ? ? ?Assessment/Plan ? ?1. Abdominal bloating ?Bloating right upper quadrant pain and nausea all would suggest possible gallbladder disease.  I reviewed imaging studies going back several years and there has never been any mention of gallstones.  Will treat symptomatically with simethicone as well as Zofran for nausea.  Given patient instructions to avoid foods that are gas producing especially melons, spicy foods,  beans cabbage etc. if symptoms do not resolve with simple treatment consider gallbladder ultrasound ? ? ? ?3. Atrial fibrillation, chronic ([place]) ?Patient's heart rhythm is irregular consistent with A-fib.  She does t

## 2022-02-13 ENCOUNTER — Other Ambulatory Visit: Payer: Self-pay | Admitting: Cardiovascular Disease

## 2022-02-28 ENCOUNTER — Telehealth: Payer: Self-pay | Admitting: *Deleted

## 2022-02-28 NOTE — Telephone Encounter (Signed)
Patient called and stated that her symptoms have NOT improved and requesting a Gallbladder Ultrasound.  ? ?Please Advise.  ? ? ?OV Note Dated 02/07/2022: ?Assessment/Plan ?  ?1. Abdominal bloating ?Bloating right upper quadrant pain and nausea all would suggest possible gallbladder disease.  I reviewed imaging studies going back several years and there has never been any mention of gallstones.  Will treat symptomatically with simethicone as well as Zofran for nausea.  Given patient instructions to avoid foods that are gas producing especially melons, spicy foods, beans cabbage etc. if symptoms do not resolve with simple treatment consider gallbladder ultrasound ?

## 2022-03-01 NOTE — Telephone Encounter (Signed)
Patient called back to get an update. Advised the patient of the status. Resending to Dr. Sabra Heck high priority.

## 2022-03-06 ENCOUNTER — Other Ambulatory Visit: Payer: Self-pay | Admitting: Family Medicine

## 2022-03-06 DIAGNOSIS — R140 Abdominal distension (gaseous): Secondary | ICD-10-CM

## 2022-03-06 DIAGNOSIS — R1011 Right upper quadrant pain: Secondary | ICD-10-CM

## 2022-03-06 NOTE — Telephone Encounter (Signed)
[id number] - US ABDOMEN LIMITED RUQ (LIVER/GB) None  1 1   Diagnosis Information  Diagnosis  R14.0 (ICD-10-CM) - Abdominal bloating  R10.11 (ICD-10-CM) - Right upper quadrant abdominal pain     Dr. Sabra Heck placed a Ultrasound Referral.  Patient notified and agreed.

## 2022-03-13 ENCOUNTER — Ambulatory Visit
Admission: RE | Admit: 2022-03-13 | Discharge: 2022-03-13 | Disposition: A | Discharge status: Home / Self Care | Payer: Medicare Other | Source: Ambulatory Visit | Attending: Family Medicine | Admitting: Family Medicine

## 2022-03-13 DIAGNOSIS — R1011 Right upper quadrant pain: Secondary | ICD-10-CM

## 2022-03-13 DIAGNOSIS — R140 Abdominal distension (gaseous): Secondary | ICD-10-CM

## 2022-03-15 ENCOUNTER — Other Ambulatory Visit: Payer: Self-pay | Admitting: Adult Health

## 2022-03-15 ENCOUNTER — Other Ambulatory Visit: Payer: Self-pay | Admitting: Internal Medicine

## 2022-03-15 NOTE — Telephone Encounter (Signed)
Patient has request refill on medication Torsemide 20mg . Patient medication added to medication list 08/02/2021. Medication not filled by PCP Virgie Dad, MD . Medication pend and sent to Ok Edwards, NP.

## 2022-03-20 ENCOUNTER — Other Ambulatory Visit: Payer: Self-pay | Admitting: Family Medicine

## 2022-03-20 ENCOUNTER — Telehealth: Payer: Self-pay

## 2022-03-20 DIAGNOSIS — R140 Abdominal distension (gaseous): Secondary | ICD-10-CM

## 2022-03-20 NOTE — Telephone Encounter (Signed)
Patient states she had [place] of gallbladder last week and saw [facility]. [place] was negative. Was told to see Gertie Fey if continues to have issues. She is now requesting the referral for GI.

## 2022-03-22 NOTE — Telephone Encounter (Signed)
Referral has been placed. Patient should be receiving a call.

## 2022-04-06 ENCOUNTER — Encounter: Payer: Self-pay | Admitting: Internal Medicine

## 2022-04-10 ENCOUNTER — Telehealth: Payer: Self-pay

## 2022-04-10 NOTE — Telephone Encounter (Signed)
Patient called to get status on GI referral. The following info communicated with the patient:  Referral sent to [facility] they will contact patient with appointment.   [facility]   Address: [address] [place], [place], [place] [address]   Phone: [phone]

## 2022-05-01 ENCOUNTER — Other Ambulatory Visit: Payer: Medicare Other

## 2022-05-01 DIAGNOSIS — E871 Hypo-osmolality and hyponatremia: Secondary | ICD-10-CM

## 2022-05-01 LAB — BASIC METABOLIC PANEL WITH GFR
BUN/Creatinine Ratio: 16 (calc) (ref 6–22)
BUN: 17 mg/dL (ref 7–25)
CO2: 27 mmol/L (ref 20–32)
Calcium: 9.8 mg/dL (ref 8.6–10.4)
Chloride: 99 mmol/L (ref 98–110)
Creat: 1.06 mg/dL — ABNORMAL HIGH (ref 0.60–0.95)
Glucose, Bld: 89 mg/dL (ref 65–99)
Potassium: 4.2 mmol/L (ref 3.5–5.3)
Sodium: 137 mmol/L (ref 135–146)
eGFR: 51 mL/min/{1.73_m2} — ABNORMAL LOW (ref >=60)

## 2022-05-03 ENCOUNTER — Encounter: Payer: Medicare Other | Admitting: Nurse Practitioner

## 2022-05-08 ENCOUNTER — Other Ambulatory Visit: Payer: Self-pay | Admitting: Internal Medicine

## 2022-05-10 ENCOUNTER — Encounter: Payer: Self-pay | Admitting: Nurse Practitioner

## 2022-05-10 ENCOUNTER — Non-Acute Institutional Stay: Payer: Medicare Other | Admitting: Nurse Practitioner

## 2022-05-10 DIAGNOSIS — I5032 Chronic diastolic (congestive) heart failure: Secondary | ICD-10-CM | POA: Diagnosis not present

## 2022-05-10 DIAGNOSIS — K5901 Slow transit constipation: Secondary | ICD-10-CM

## 2022-05-10 DIAGNOSIS — I10 Essential (primary) hypertension: Secondary | ICD-10-CM | POA: Diagnosis not present

## 2022-05-10 DIAGNOSIS — N952 Postmenopausal atrophic vaginitis: Secondary | ICD-10-CM

## 2022-05-10 DIAGNOSIS — K219 Gastro-esophageal reflux disease without esophagitis: Secondary | ICD-10-CM

## 2022-05-10 DIAGNOSIS — N1832 Chronic kidney disease, stage 3b: Secondary | ICD-10-CM | POA: Diagnosis not present

## 2022-05-10 DIAGNOSIS — E785 Hyperlipidemia, unspecified: Secondary | ICD-10-CM

## 2022-05-10 DIAGNOSIS — E871 Hypo-osmolality and hyponatremia: Secondary | ICD-10-CM

## 2022-05-10 DIAGNOSIS — D500 Iron deficiency anemia secondary to blood loss (chronic): Secondary | ICD-10-CM

## 2022-05-10 DIAGNOSIS — I482 Chronic atrial fibrillation, unspecified: Secondary | ICD-10-CM | POA: Diagnosis not present

## 2022-05-10 MED ORDER — PANTOPRAZOLE SODIUM 40 MG PO TBEC: 40.0000 mg | DELAYED_RELEASE_TABLET | Freq: Every day | ORAL | 11 refills | Status: DC

## 2022-05-10 MED ORDER — PANTOPRAZOLE SODIUM 40 MG PO TBEC
40.0000 mg | DELAYED_RELEASE_TABLET | Freq: Two times a day (BID) | ORAL | 3 refills | Status: DC
Start: 2022-05-10 — End: 2022-05-11

## 2022-05-10 NOTE — Assessment & Plan Note (Signed)
Stable, takes MiraLax, Bisacodyl.

## 2022-05-10 NOTE — Assessment & Plan Note (Addendum)
May try Pantoprazole bid,  bloated abd sometimes, takes Simethicone, pending GI referral.

## 2022-05-10 NOTE — Assessment & Plan Note (Signed)
stable, Hgb 12.4 01/29/22  off Fe

## 2022-05-10 NOTE — Assessment & Plan Note (Signed)
Normalized sodium, Na 137 05/01/22

## 2022-05-10 NOTE — Assessment & Plan Note (Signed)
Blood pressure is controlled,  takes  Coreg, Rosuvastatin. Bun/creat  33/1.25 05/31/21

## 2022-05-10 NOTE — Progress Notes (Signed)
Location:   [facility]   Place of Service:    Provider: Marlana Latus NP  Code Status: DNR Goals of Care: [place]    05/10/2022   11:07 AM  Advanced Directives  Does Patient Have a Medical Advance Directive? Yes  Type of Advance Directive Living will  Does patient want to make changes to medical advance directive? No - Patient declined     Chief Complaint  Patient presents with   Medical Management of Chronic Issues    Patient is here for a 79M F/U with review of current labs. Discuss need for updated vaccines. Tdap, 2nd Shingrix, and Pneumonia 23. NCIR verified    HPI: Patient is a 88 y.o. female seen today for medical management of chronic diseases.    Hyponatremia, 137 05/01/22 05/31/21, had Nephrology evaluation. 2015ml/day fluid restriction.              HTN, takes  Coreg, Rosuvastatin. Bun/creat  33/1.25 05/31/21             Afib, takes Coreg, Eliquis, TSH 1.98 02/02/21             CHF, chronic edema BLE, near none,  takes Torsemide 4x/wk since 01/13/21, EF 60-65%, hospitalized 05/30/20-06/08/20 for acute on CHF,  . F/u cardiology,  on 2039ml/day fluid restriction. Bun/creat 17/1.06 05/01/22             CKD, Bun/creat 17/1.06 05/01/22             Constipation, takes MiraLax, Bisacodyl.              Atrophic vaginitis, takes Estradiol vaginal tab             GERD takes Pantoprazole, bloated abd sometimes, takes Simethicone             Anemia, stable, Hgb 12.4 01/29/22  off Fe             Hyperlipidemia, LDL 52 01/05/21, takes Rosuvastatin.              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 Small table lung nodules, no need for continued f/u CT scan.        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[place])  01/05/2016   Right lower lobectomy   Diverticulitis 1999   Femur fracture ([place]) 03/2013   left, non weight bearing for 2 1/2 weeks   GERD (gastroesophageal reflux disease)    Hemorrhoids 1991   HTN (hypertension)    Lymphedema of upper extremity    right arm   Osteoporosis    PONV (postoperative nausea and vomiting)    Urinary, incontinence, stress female    Vitamin D deficiency disease     Past Surgical History:  Procedure Laterality Date   APPENDECTOMY     BREAST BIOPSY  05/04/1987   Right Dr Annamaria Boots   BREAST IMPLANT REMOVAL Right 06/05/10   BUNIONECTOMY  1988   carcinoid Right    Right lower lobectomy 2017   COLONOSCOPY W/ POLYPECTOMY     EYE SURGERY  5/14 ; 6/14   Cataract Surgery   JOINT REPLACEMENT     2002 left; 2009 right Hip,  rt knee 2012   KNEE ARTHROPLASTY Right 2012   [place]   LOBECTOMY Right 01/05/2016   Procedure: RIGHT LOWER LOBE LOBECTOMY;  Surgeon: Melrose Nakayama, MD;  Location: [place];  Service: Thoracic;  Laterality: Right;   MASTECTOMY PARTIAL / LUMPECTOMY W/ AXILLARY LYMPHADENECTOMY  04/05/1987   Right - Dr Annamaria Boots   RESECTION OF MEDIASTINAL MASS N/A 01/05/2016   Procedure: RESECTION OF PLEURAL MASS, right;  Surgeon: Melrose Nakayama, MD;  Location: [place];  Service: Thoracic;  Laterality: N/A;   TONSILLECTOMY     TOTAL HIP ARTHROPLASTY     TUBAL LIGATION Bilateral 1970   Flas[place]   myomata   VIDEO ASSISTED THORACOSCOPY (VATS)/WEDGE RESECTION Right 01/05/2016   Procedure: RIGHT VIDEO ASSISTED THORACOSCOPY (VATS)/WEDGE RESECTION;  Surgeon: Melrose Nakayama, MD;  Location: [place];  Service: Thoracic;  Laterality: Right;    Allergies  Allergen Reactions   Aspirin Rash    Allergies as of 05/10/2022       Reactions   Aspirin Rash        Medication List        Accurate as of May 10, 2022  4:54 PM. If you have any questions, ask your nurse or doctor.          acetaminophen 500 MG tablet Commonly known as:  TYLENOL Take 2 tablets (1,000 mg total) by mouth every 6 (six) hours as needed for mild pain.   bisacodyl 5 MG EC tablet Commonly known as: DULCOLAX Take 1 tablet (5 mg total) by mouth daily as needed for severe constipation.   Calcium Carbonate-Vitamin D 600-400 MG-UNIT tablet Take 1 tablet by mouth daily.   carboxymethylcellulose 0.5 % Soln Commonly known as: REFRESH PLUS Place 1 drop into both eyes 2 (two) times daily as needed (for dry eyes).   carvedilol 6.25 MG tablet Commonly known as: COREG TAKE ONE TABLET BY MOUTH TWICE DAILY AFTER MEALS   Eliquis 2.5 MG Tabs tablet Generic drug: apixaban TAKE ONE TABLET BY MOUTH TWICE DAILY   Fish Oil 1000 MG Caps Take 1 capsule by mouth 2 (two) times daily.   lactose free nutrition Liqd Take 237 mLs by mouth 2 (two) times daily between meals.   multivitamin with minerals Tabs tablet Take 1 tablet by mouth every evening.   ondansetron 4 MG tablet Commonly known as: Zofran Take 1 tablet (4 mg total) by mouth every 8 (eight) hours as needed for nausea or vomiting.   pantoprazole 40 MG tablet Commonly known as: PROTONIX Take 1 tablet (40 mg total) by mouth 2 (two) times daily. What changed: when to take this Changed by: Ida Uppal X Chelsae Zanella, NP   polyethylene glycol 17 g packet Commonly known as: MIRALAX / GLYCOLAX Take 17 g by mouth daily as needed for moderate constipation.   potassium chloride SA 20 MEQ tablet Commonly known as: KLOR-CON M TAKE ONE TABLET BY MOUTH ONCE DAILY   rosuvastatin 10 MG tablet Commonly known as: CRESTOR TAKE ONE TABLET AT BEDTIME.   simethicone 80 MG chewable tablet Commonly known as: Gas-X Chew 1 tablet (80 mg total) by mouth every 6 (six) hours as needed for flatulence.   torsemide 20 MG tablet Commonly known as: DEMADEX TAKE ONE TABLET BY MOUTH DAILY   Vitamin D2 10 MCG (400 UNIT) Tabs Take 400 Units by mouth daily.        Review of Systems:  Review of Systems  Constitutional:   Negative for appetite change, fatigue  and fever.  HENT:  Positive for hearing loss. Negative for congestion, ear pain and sore throat.   Eyes:  Negative for visual disturbance.  Respiratory:  Positive for cough. Negative for shortness of breath.        Hx of right lower lobectomy 2017. Chronic cough remains the same. Uses O2 via [place] at night   Cardiovascular:  Negative for leg swelling.  Gastrointestinal:  Positive for nausea. Negative for abdominal pain and constipation.       Bloating, upset stomach  Genitourinary:  Positive for frequency. Negative for dysuria and urgency.       2-3x/night for years   Musculoskeletal:  Positive for arthralgias and gait problem.  Skin:  Negative for color change.  Neurological:  Negative for speech difficulty, weakness and headaches.  Psychiatric/Behavioral:  Negative for behavioral problems and sleep disturbance. The patient is not nervous/anxious.     Health Maintenance  Topic Date Due   Pneumonia Vaccine 56+ Years old (2 - PCV) 10/23/2015   COVID-19 Vaccine (3 - Moderna risk series) 12/14/2019   TETANUS/TDAP  05/09/2020   Zoster Vaccines- Shingrix (2 of 2) 06/19/2021   INFLUENZA VACCINE  05/15/2022   DEXA SCAN  Completed   HPV VACCINES  Aged Out    Physical Exam: Vitals:   05/10/22 1617  BP: (!) 110/58  Pulse: 68  Resp: 18  Temp: 97.9 F (36.6 C)  TempSrc: Temporal  SpO2: 91%  Weight: 124 lb (56.2 kg)  Height: 4\' 11"  (1.499 m)   Body mass index is 25.04 kg/m. Physical Exam Vitals and nursing note reviewed.  Constitutional:      Appearance: Normal appearance.  HENT:     Head: Normocephalic and atraumatic.     Mouth/Throat:     Mouth: Mucous membranes are dry.     Comments: Red and enlarged tonsils.  Eyes:     Extraocular Movements: Extraocular movements intact.     Conjunctiva/sclera: Conjunctivae normal.     Pupils: Pupils are equal, round, and reactive to light.  Cardiovascular:     Rate and Rhythm: Normal rate. Rhythm  irregular.     Heart sounds: No murmur heard. Pulmonary:     Breath sounds: Rales present. No wheezing.     Comments: s/p right lung lobectomy 2017. Right lumpectomy 30 years ago. Posterior left basilar rales.  Abdominal:     General: Bowel sounds are normal. There is no distension.     Palpations: Abdomen is soft.     Tenderness: There is no abdominal tenderness. There is no right CVA tenderness, left CVA tenderness, guarding or rebound.  Musculoskeletal:     Cervical back: Normal range of motion.     Right lower leg: No edema.     Left lower leg: No edema.  Skin:    General: Skin is warm and dry.  Neurological:     General: No focal deficit present.     Mental Status: She is alert and oriented to person, place, and time. Mental status is at baseline.     Motor: No weakness.     Coordination: Coordination normal.     Gait: Gait abnormal.  Psychiatric:        Mood and Affect: Mood normal.        Behavior: Behavior normal.        Thought Content: Thought content normal.        Judgment: Judgment normal.     Labs reviewed: Basic Metabolic Panel: Recent Labs  10/17/21 0710 01/29/22 1631 05/01/22 0730  NA 131* 137 137  K 4.8 4.7 4.2  CL 96* 99 99  CO2 26 27 27   GLUCOSE 99 100* 89  BUN 19 16 17   CREATININE 1.14* 0.99 1.06*  CALCIUM 9.5 9.6 9.8   Liver Function Tests: Recent Labs    05/31/21 0844 10/17/21 0710 01/29/22 1631  AST 17 28 36  ALT 10 12 14   ALKPHOS  --   --  65  BILITOT 1.1 0.9 1.6*  PROT 7.0 7.0 7.5  ALBUMIN  --   --  3.9   No results for input(s): "LIPASE", "AMYLASE" in the last 8760 hours. No results for input(s): "AMMONIA" in the last 8760 hours. CBC: Recent Labs    05/31/21 0844 10/17/21 0710 01/29/22 1631  WBC 3.4* 4.6 4.9  NEUTROABS 1,969 3,206 3.6  HGB 11.8 12.1 12.4  HCT 36.9 37.2 39.7  MCV 98.4 97.9 103.4*  PLT 145 189 178   Lipid Panel: No results for input(s): "CHOL", "HDL", "LDLCALC", "TRIG", "CHOLHDL", "LDLDIRECT" in the  last 8760 hours. No results found for: "HGBA1C"  Procedures since last visit: No results found.  Assessment/Plan  Essential hypertension Blood pressure is controlled,  takes  Coreg, Rosuvastatin. Bun/creat  33/1.25 05/31/21  Atrial fibrillation, chronic (HCC) Heart rate is in control,  takes Coreg, Eliquis, TSH 1.98 02/02/21  Congestive heart failure (CHF) (HCC) chronic edema BLE, near none,  takes Torsemide 4x/wk since 01/13/21, EF 60-65%, hospitalized 05/30/20-06/08/20 for acute on CHF,  . F/u cardiology,  on 2031ml/day fluid restriction. Bun/creat 17/1.06 05/01/22  CKD (chronic kidney disease) stage 3, GFR 30-59 ml/min (HCC) Stable, Bun/creat 17/1.06 05/01/22  Slow transit constipation Stable, takes MiraLax, Bisacodyl.   Atrophic vaginitis Stable,  takes Estradiol vaginal tab  GERD (gastroesophageal reflux disease) May try Pantoprazole bid,  bloated abd sometimes, takes Simethicone, pending GI referral.   IDA (iron deficiency anemia) stable, Hgb 12.4 01/29/22  off Fe  Hyperlipidemia LDL 52 01/05/21, takes Rosuvastatin.   Hyponatremia Normalized sodium, Na 137 05/01/22   Labs/tests ordered: none  Next appt: 6 months.

## 2022-05-10 NOTE — Assessment & Plan Note (Signed)
Heart rate is in control,  takes Coreg, Eliquis, TSH 1.98 02/02/21

## 2022-05-10 NOTE — Assessment & Plan Note (Signed)
chronic edema BLE, near none,  takes Torsemide 4x/wk since 01/13/21, EF 60-65%, hospitalized 05/30/20-06/08/20 for acute on CHF,  . F/u cardiology,  on 2066ml/day fluid restriction. Bun/creat 17/1.06 05/01/22

## 2022-05-10 NOTE — Assessment & Plan Note (Signed)
Stable, Bun/creat 17/1.06 05/01/22

## 2022-05-10 NOTE — Assessment & Plan Note (Signed)
Stable,  takes Estradiol vaginal tab

## 2022-05-10 NOTE — Assessment & Plan Note (Signed)
LDL 52 01/05/21, takes Rosuvastatin.

## 2022-05-11 ENCOUNTER — Other Ambulatory Visit: Payer: Self-pay | Admitting: *Deleted

## 2022-05-11 DIAGNOSIS — K219 Gastro-esophageal reflux disease without esophagitis: Secondary | ICD-10-CM

## 2022-05-11 MED ORDER — PANTOPRAZOLE SODIUM 40 MG PO TBEC: 40.0000 mg | DELAYED_RELEASE_TABLET | Freq: Two times a day (BID) | ORAL | 3 refills | Status: DC

## 2022-05-11 NOTE — Telephone Encounter (Signed)
[name] with [facility] called and stated that they received 2 Rx for Pantoprazole with 2 different directions. One for Once daily and One for twice daily.   Reviewed OV note dated 05/10/2022: GERD (gastroesophageal reflux disease) [name] try Pantoprazole bid,  bloated abd sometimes, takes Simethicone, pending GI referral.     Updated Rx sent to pharmacy.

## 2022-05-15 ENCOUNTER — Encounter: Payer: Self-pay | Admitting: Gastroenterology

## 2022-06-11 ENCOUNTER — Encounter: Payer: Self-pay | Admitting: Gastroenterology

## 2022-06-11 ENCOUNTER — Telehealth: Payer: Self-pay

## 2022-06-11 ENCOUNTER — Ambulatory Visit (INDEPENDENT_AMBULATORY_CARE_PROVIDER_SITE_OTHER): Payer: Medicare Other | Admitting: Gastroenterology

## 2022-06-11 VITALS — BP 120/78 | HR 75 | Ht 59.0 in | Wt 126.0 lb

## 2022-06-11 DIAGNOSIS — K219 Gastro-esophageal reflux disease without esophagitis: Secondary | ICD-10-CM | POA: Diagnosis not present

## 2022-06-11 DIAGNOSIS — R630 Anorexia: Secondary | ICD-10-CM

## 2022-06-11 DIAGNOSIS — Z7901 Long term (current) use of anticoagulants: Secondary | ICD-10-CM

## 2022-06-11 DIAGNOSIS — R140 Abdominal distension (gaseous): Secondary | ICD-10-CM

## 2022-06-11 DIAGNOSIS — R110 Nausea: Secondary | ICD-10-CM

## 2022-06-11 DIAGNOSIS — R12 Heartburn: Secondary | ICD-10-CM

## 2022-06-11 DIAGNOSIS — K5909 Other constipation: Secondary | ICD-10-CM

## 2022-06-11 DIAGNOSIS — I4811 Longstanding persistent atrial fibrillation: Secondary | ICD-10-CM

## 2022-06-11 DIAGNOSIS — R0989 Other specified symptoms and signs involving the circulatory and respiratory systems: Secondary | ICD-10-CM

## 2022-06-11 NOTE — Telephone Encounter (Signed)
[facility] Pre-operative Risk Assessment     Request for surgical clearance:   Endoscopy Procedure  What type of surgery is being performed?     Endoscopy  When is this surgery scheduled?   07/03/22  What type of clearance is required ?  Pharmacy  Are there any medications that need to be held prior to surgery and how long? Eliquis for 1 day  Practice name and name of physician performing surgery?   [facility], Dr. Bryan Lemma  What is your office phone and fax number?      Phone- [phone]  [phone]  Anesthesia type (None, local, MAC, general) ?       MAC

## 2022-06-11 NOTE — Patient Instructions (Addendum)
_______________________________________________________ If you are age 86 or older, your body mass index should be between 23-30. Your Body mass index is 25.45 kg/m. If this is out of the aforementioned range listed, please consider follow up with your Primary Care Provider.  _______________________________________________________  The  GI providers would like to encourage you to use [facility] to communicate with providers for non-urgent requests or questions.  Due to long hold times on the telephone, sending your provider a message by [facility] may be a faster and more efficient way to get a response.  Please allow 48 business hours for a response.  Please remember that this is for non-urgent requests.  _______________________________________________________ Due to recent changes in healthcare laws, you may see the results of your imaging and laboratory studies on MyChart before your provider has had a chance to review them.  We understand that in some cases there may be results that are confusing or concerning to you. Not all laboratory results come back in the same time frame and the provider may be waiting for multiple results in order to interpret others.  Please give [place] 48 hours in order for your provider to thoroughly review all the results before contacting the office for clarification of your results.   You have been scheduled for an endoscopy. Please follow written instructions given to you at your visit today. If you use inhalers (even only as needed), please bring them with you on the day of your procedure.   You will be contaced by our office prior to your procedure for directions on holding your Eliquis.  If you do not hear from our office 1 week prior to your scheduled procedure, please call [phone] to discuss.    Thank you for choosing me and [facility].  Vito Cirigliano, D.O.

## 2022-06-11 NOTE — Progress Notes (Signed)
Chief Complaint: Nausea, bloating, decreased appetite   Referring Provider:     Wardell Honour, MD   HPI:     [name] is a 88 y.o. 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  She presents today from [facility] facility.  Longstanding history of GERD, with recent increase in reflux symptoms described as heartburn, hoarseness, increased throat clearing.  Has been taking Protonix 40 mg daily for 3 years or so.  Does not recall any previous EGD.  Sxs started in April as reduced appetite, nausea, weakness. Occasional heartburn, hoarseness, increased throat clearing. +bloating and rumbling. No pain. Was seen in ER 01/29/2022: - Normal WBC, PLT.  T. bili 1.6, otherwise unremarkable CMP. - CXR with possible pneumonia vs pleural effusion - CT PE protocol with possible pneumonia.  Was treated with antibiotics and discharged home. Had c/f GB etiology, but subsequent RUQ [place] was unrevealing.  - 03/13/2022: RUQ [place]: Cirrhotic liver morphology.  Otherwise normal gallbladder, CBD.  2 simple hepatic cysts measuring up to 1.5 cm.  Today, she states she continues to have these same symptoms.  Some improvement in abdominal bloating and gaseous distention with simethicone on demand.  Reflux symptoms have improved with high-dose PPI.  Unable to tolerate Zofran due to exacerbation of constipation.  Does have chronic constipation, controlled with Miralax daily for last 3 years or so.   Family history notable  for both mother and father with colon cancer.  Previously followed by Dr. Oletta Lamas at [place], last seen 02/23/2019 evaluation of possible early cirrhosis, thrombocytopenia.  Thought at that time was while she does have some possible changes of cirrhosis on imaging, it was nonprogressive over serial imaging.  Liver enzymes otherwise normal.  Recommended repeat CT liver three-phase to evaluate for signs of portal hypertension and held off on EGD, partly due to COVID-19 pandemic.  - 08/23/2008: Colonoscopy: No report available for review - 12/29/2013: Colonoscopy: Adenomatous polyp.  Recall not recommended due to age stated recommended FOBT testing  - 06/01/2016: Abdominal ultrasound: Multiple hepatic cysts, grossly similar to previous CT.  Nodular liver contour -06/02/2016: CT A/P perforated ileal diverticulum, likely Meckel's in location with regional enteritis.  Lobulated liver surface compatible with cirrhosis.  Scattered hepatic cysts - 05/22/2018: Dotatate scan for pulmonary carcinoid: Very low dotatate activity in lobular nodules at lung base.  No evidence of malignancy outside of lungs - 11/25/2018: RUQ [place]: Mildly nodular liver contour, multiple small hepatic cysts - 02/20/2020: Nuc med bleeding scan: Negative/no bleeding -06/02/2019: CT A/P: Irregular, nodular liver contour, unchanged 1.5 cm right lobe cyst, unchanged 5 mm left lobe cyst.  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   Right lower lobectomy   Diverticulitis 1999   Femur fracture ([place]) 03/2013   left, non weight bearing  for 2 1/2 weeks   GERD (gastroesophageal reflux disease)    Hemorrhoids 1991   HTN (hypertension)    Lymphedema of upper extremity    right arm   Osteoporosis    PONV (postoperative nausea  and vomiting)    Urinary, incontinence, stress female    Vitamin D deficiency disease      Past Surgical History:  Procedure Laterality Date   APPENDECTOMY     BREAST BIOPSY  05/04/1987   Right Dr Annamaria Boots   BREAST IMPLANT REMOVAL Right 06/05/10   BUNIONECTOMY  1988   carcinoid Right    Right lower lobectomy 2017   COLONOSCOPY W/ POLYPECTOMY     EYE SURGERY  5/14 ; 6/14   Cataract Surgery   JOINT REPLACEMENT     2002 left; 2009 right Hip, rt knee 2012   KNEE ARTHROPLASTY Right 2012   [place] Right 01/05/2016   Procedure: RIGHT LOWER LOBE LOBECTOMY;  Surgeon: Melrose Nakayama, MD;  Location: [facility] OR;  Service: Thoracic;  Laterality: Right;   MASTECTOMY PARTIAL / LUMPECTOMY W/ AXILLARY LYMPHADENECTOMY  04/05/1987   Right - Dr Annamaria Boots   RESECTION OF MEDIASTINAL MASS N/A 01/05/2016   Procedure: RESECTION OF PLEURAL MASS, right;  Surgeon: Melrose Nakayama, MD;  Location: [facility];  Service: Thoracic;  Laterality: N/A;   TONSILLECTOMY     TOTAL HIP ARTHROPLASTY     TUBAL LIGATION Bilateral 1970   VAGINAL HYSTERECTOMY  1977   myomata   VIDEO ASSISTED THORACOSCOPY (VATS)/WEDGE RESECTION Right 01/05/2016   Procedure: RIGHT VIDEO ASSISTED THORACOSCOPY (VATS)/WEDGE RESECTION;  Surgeon: Melrose Nakayama, MD;  Location: [facility] OR;  Service: Thoracic;  Laterality: Right;   Family History  Problem Relation Age of Onset   Colon cancer Mother    Colon cancer Father    Heart disease Father    Stomach cancer Neg Hx    Liver cancer Neg Hx    Esophageal cancer Neg Hx    Social History   Tobacco Use   Smoking status: Never   Smokeless tobacco: Never  Vaping Use   Vaping Use: Never used  Substance Use Topics   Alcohol use: No    Alcohol/week: 0.0 standard drinks of alcohol   Drug use: No   Current Outpatient Medications  Medication Sig Dispense Refill   acetaminophen (TYLENOL) 500 MG tablet Take 2 tablets (1,000 mg total) by mouth every 6 (six) hours as needed for mild  pain. 30 tablet 0   apixaban (ELIQUIS) 2.5 MG TABS tablet TAKE ONE TABLET BY MOUTH TWICE DAILY 180 tablet 3   bisacodyl (DULCOLAX) 5 MG EC tablet Take 1 tablet (5 mg total) by mouth daily as needed for severe constipation.     Calcium Carbonate-Vitamin D 600-400 MG-UNIT tablet Take 1 tablet by mouth daily.     carvedilol (COREG) 6.25 MG tablet TAKE ONE TABLET BY MOUTH TWICE DAILY AFTER MEALS 60 tablet 5   Ergocalciferol (VITAMIN D2) 10 MCG (400 UNIT) TABS Take 400 Units by mouth daily. 30 tablet 0   lactose free nutrition (BOOST) LIQD Take 237 mLs by mouth 2 (two) times daily between meals.     Multiple Vitamin (MULTIVITAMIN WITH MINERALS) TABS Take 1 tablet by mouth every evening.     Omega-3 Fatty Acids (FISH OIL) 1000 MG CAPS Take 1 capsule by mouth 2 (two) times daily.     ondansetron (ZOFRAN) 4 MG tablet Take 1 tablet (4 mg total) by mouth every 8 (eight)  hours as needed for nausea or vomiting. 12 tablet 0   pantoprazole (PROTONIX) 40 MG tablet Take 1 tablet (40 mg total) by mouth 2 (two) times daily. 60 tablet 3   polyethylene glycol (MIRALAX / GLYCOLAX) 17 g packet Take 17 g by mouth daily as needed for moderate constipation. 14 each 2   potassium chloride SA (KLOR-CON M) 20 MEQ tablet TAKE ONE TABLET BY MOUTH ONCE DAILY 90 tablet 5   rosuvastatin (CRESTOR) 10 MG tablet TAKE ONE TABLET AT BEDTIME. 90 tablet 3   simethicone (GAS-X) 80 MG chewable tablet Chew 1 tablet (80 mg total) by mouth every 6 (six) hours as needed for flatulence. 30 tablet 0   carboxymethylcellulose (REFRESH PLUS) 0.5 % SOLN Place 1 drop into both eyes 2 (two) times daily as needed (for dry eyes). (Patient not taking: Reported on 06/11/2022)     torsemide (DEMADEX) 20 MG tablet TAKE ONE TABLET BY MOUTH DAILY (Patient taking differently: Take 20 tablets by mouth.) 30 tablet 5   No current facility-administered medications for this visit.   Allergies  Allergen Reactions   Aspirin Rash     Review of Systems: All  systems reviewed and negative except where noted in HPI.     Physical Exam:    Wt Readings from Last 3 Encounters:  06/11/22 126 lb (57.2 kg)  05/10/22 124 lb (56.2 kg)  02/07/22 127 lb 3.2 oz (57.7 kg)    BP 120/78   Pulse 75   Ht 4\' 11"  (1.499 m)   Wt 126 lb (57.2 kg)   LMP 10/16/1975 (Approximate)   SpO2 98%   BMI 25.45 kg/m  Constitutional:  Pleasant, in no acute distress. Psychiatric: Normal mood and affect. Behavior is normal. Cardiovascular: Normal rate, regular rhythm. No edema Pulmonary/chest: Effort normal and breath sounds normal. No wheezing, rales or rhonchi. Abdominal: Soft, nondistended, nontender. Bowel sounds active throughout. There are no masses palpable. No hepatomegaly. Neurological: Alert and oriented to person place and time. Skin: Skin is warm and dry. No rashes noted.   ASSESSMENT AND PLAN;   1) GERD 2) Heartburn 3) Decreased appetite 4) Nausea 5) Abdominal bloating - EGD with biopsies to evaluate for mucosal/luminal pathology along with evaluation of erosive esophagitis, LES laxity, hiatal hernia - Continue PPI as currently prescribed - Continue antireflux lifestyle/dietary modifications to include HOB elevation as currently doing - If evaluation unrevealing, plan for HIDA scan - May benefit from course of probiotic and/or rifaximin  6) Chronic constipation - Well-controlled on MiraLAX - No change to current regimen  7) Atrial fibrillation 8) Chronic anticoagulation - Hold Eliquis 2 days before procedure - will instruct when and how to resume after procedure. Low but real risk of cardiovascular event such as heart attack, stroke, embolism, thrombosis or ischemia/infarct of other organs off Eliquis explained and need to seek urgent help if this occurs. The patient consents to proceed. Will communicate by phone or EMR with patient's prescribing provider to confirm that holding Eliquis is reasonable in this case   The indications, risks, and  benefits of EGD were explained to the patient in detail. Risks include but are not limited to bleeding, perforation, adverse reaction to medications, and cardiopulmonary compromise. Sequelae include but are not limited to the possibility of surgery, hospitalization, and mortality. The patient verbalized understanding and wished to proceed. All questions answered, referred to scheduler. Further recommendations pending results of the exam.     Lavena Bullion, DO, FACG  06/11/2022, 1:29 PM  Wardell Honour, MD

## 2022-06-12 NOTE — Telephone Encounter (Signed)
Patient with diagnosis of afib on Eliquis for anticoagulation.    Procedure: endoscopy Date of procedure: 07/03/22   CHA2DS2-VASc Score = 5   This indicates a 7.2% annual risk of stroke. The patient's score is based upon: CHF History: 1 HTN History: 1 Diabetes History: 0 Stroke History: 0 Vascular Disease History: 0 Age Score: 2 Gender Score: 1      CrCl 37 ml/min  Per office protocol, patient can hold Eliquis for 1 day prior to procedure.    **This guidance is not considered finalized until pre-operative APP has relayed final recommendations.**

## 2022-06-12 NOTE — Telephone Encounter (Signed)
Will route to PharmD for rec's re: holding anticoagulation. Richardson Dopp, PA-C    06/12/2022 2:44 PM

## 2022-06-12 NOTE — Telephone Encounter (Signed)
   Name: [name] DOB: 09/14/1934  MRN: [id number]  Primary Cardiologist: Evalina Field, MD  Chart reviewed as part of pre-operative protocol coverage.   Recommendations: Per office protocol, patient can hold Eliquis for 1 day prior to procedure.   Please call with questions. Richardson Dopp, PA-C 06/12/2022, 5:48 PM

## 2022-06-13 NOTE — Telephone Encounter (Signed)
Patient is informed to hold Eliquis for 1 day prior to procedure.

## 2022-06-26 ENCOUNTER — Ambulatory Visit (INDEPENDENT_AMBULATORY_CARE_PROVIDER_SITE_OTHER): Payer: Medicare Other | Admitting: Adult Health

## 2022-06-26 ENCOUNTER — Encounter: Payer: Self-pay | Admitting: Adult Health

## 2022-06-26 VITALS — BP 128/74 | HR 62 | Temp 97.3°F | Resp 18 | Ht 59.0 in | Wt 125.0 lb

## 2022-06-26 DIAGNOSIS — N390 Urinary tract infection, site not specified: Secondary | ICD-10-CM | POA: Diagnosis not present

## 2022-06-26 DIAGNOSIS — I10 Essential (primary) hypertension: Secondary | ICD-10-CM | POA: Diagnosis not present

## 2022-06-26 DIAGNOSIS — I482 Chronic atrial fibrillation, unspecified: Secondary | ICD-10-CM | POA: Diagnosis not present

## 2022-06-26 DIAGNOSIS — K219 Gastro-esophageal reflux disease without esophagitis: Secondary | ICD-10-CM | POA: Diagnosis not present

## 2022-06-26 LAB — POCT URINALYSIS DIPSTICK
Bilirubin, UA: NEGATIVE
Blood, UA: NEGATIVE
Glucose, UA: NEGATIVE
Ketones, UA: NEGATIVE
Leukocytes, UA: NEGATIVE
Nitrite, UA: NEGATIVE
Odor: NORMAL
Protein, UA: NEGATIVE
Spec Grav, UA: 1.025 (ref 1.010–1.025)
Urobilinogen, UA: 0.2 E.U./dL
pH, UA: 6.5 (ref 5.0–8.0)

## 2022-06-26 MED ORDER — CIPROFLOXACIN HCL 500 MG PO TABS: 500.0000 mg | ORAL_TABLET | Freq: Two times a day (BID) | ORAL | 0 refills | Status: AC

## 2022-06-26 NOTE — Progress Notes (Signed)
[facility] clinic  Provider:  Durenda Age DNP  Code Status:  Full Code  Goals of Care:     05/10/2022   11:07 AM  Advanced Directives  Does Patient Have a Medical Advance Directive? Yes  Type of Advance Directive Living will  Does patient want to make changes to medical advance directive? No - Patient declined     Chief Complaint  Patient presents with   Acute Visit    Possible UTI    HPI: Patient is a 88 y.o. female seen today for an acute visit for possible UTI. She has been having frequent painful urination, and has pressure in her lower abdomen X 2 days. She denies hematuria, fever and chills. Urine dipstick showed moderate leukocyte, negative nitrite and negative blood.   She is scheduled to have endoscopy on 07/03/22. She, also, has bloating and acid reflux. She takes Pantoprazole for acid reflux.  BP 128/74, takes  Coreg and Torsemide   Past Medical History:  Diagnosis Date   Arthritis 1996   Atrial fibrillation ([place])    on coumadin   Atrial tachycardia ([place])    Cancer ([place]) 1988   breast cancer, R mastectomy, chemo   Carcinoid bronchial adenoma of right lung ([place]) 01/05/2016   Right lower lobectomy   Diverticulitis 1999   Femur fracture ([place]) 03/2013   left, non weight bearing for 2 1/2 weeks   GERD (gastroesophageal reflux disease)    Hemorrhoids 1991   HTN (hypertension)    Lymphedema of upper extremity    right arm   Osteoporosis    PONV (postoperative nausea and vomiting)    Urinary, incontinence, stress female    Vitamin D deficiency disease     Past Surgical History:  Procedure Laterality Date   APPENDECTOMY     BREAST BIOPSY  05/04/1987   Right Dr Annamaria Boots   BREAST IMPLANT REMOVAL Right 06/05/10   BUNIONECTOMY  1988   carcinoid Right    Right lower lobectomy 2017   COLONOSCOPY W/ POLYPECTOMY     EYE SURGERY  5/14 ; 6/14   Cataract Surgery   JOINT REPLACEMENT     2002 left; 2009 right Hip, rt knee 2012   KNEE ARTHROPLASTY Right 2012    [place] Right 01/05/2016   Procedure: RIGHT LOWER LOBE LOBECTOMY;  Surgeon: Melrose Nakayama, MD;  Location: [facility];  Service: Thoracic;  Laterality: Right;   MASTECTOMY PARTIAL / LUMPECTOMY W/ AXILLARY LYMPHADENECTOMY  04/05/1987   Right - Dr Annamaria Boots   RESECTION OF MEDIASTINAL MASS N/A 01/05/2016   Procedure: RESECTION OF PLEURAL MASS, right;  Surgeon: Melrose Nakayama, MD;  Location: [facility];  Service: Thoracic;  Laterality: N/A;   TONSILLECTOMY     TOTAL HIP ARTHROPLASTY     TUBAL LIGATION Bilateral 1970   [place]   myomata   VIDEO ASSISTED THORACOSCOPY (VATS)/WEDGE RESECTION Right 01/05/2016   Procedure: RIGHT VIDEO ASSISTED THORACOSCOPY (VATS)/WEDGE RESECTION;  Surgeon: Melrose Nakayama, MD;  Location: [facility];  Service: Thoracic;  Laterality: Right;    Allergies  Allergen Reactions   Aspirin Rash    Outpatient Encounter Medications as of 06/26/2022  Medication Sig   acetaminophen (TYLENOL) 500 MG tablet Take 2 tablets (1,000 mg total) by mouth every 6 (six) hours as needed for mild pain.   apixaban (ELIQUIS) 2.5 MG TABS tablet TAKE ONE TABLET BY MOUTH TWICE DAILY   bisacodyl (DULCOLAX) 5 MG EC tablet Take 1 tablet (5 mg  total) by mouth daily as needed for severe constipation.   Calcium Carbonate-Vitamin D 600-400 MG-UNIT tablet Take 1 tablet by mouth daily.   carvedilol (COREG) 6.25 MG tablet TAKE ONE TABLET BY MOUTH TWICE DAILY AFTER MEALS   ciprofloxacin (CIPRO) 500 MG tablet Take 1 tablet (500 mg total) by mouth 2 (two) times daily for 7 days.   Ergocalciferol (VITAMIN D2) 10 MCG (400 UNIT) TABS Take 400 Units by mouth daily.   lactose free nutrition (BOOST) LIQD Take 237 mLs by mouth 2 (two) times daily between meals.   Multiple Vitamin (MULTIVITAMIN WITH MINERALS) TABS Take 1 tablet by mouth every evening.   Omega-3 Fatty Acids (FISH OIL) 1000 MG CAPS Take 1 capsule by mouth 2 (two) times daily.   ondansetron (ZOFRAN) 4 MG tablet Take 1  tablet (4 mg total) by mouth every 8 (eight) hours as needed for nausea or vomiting.   pantoprazole (PROTONIX) 40 MG tablet Take 1 tablet (40 mg total) by mouth 2 (two) times daily.   polyethylene glycol (MIRALAX / GLYCOLAX) 17 g packet Take 17 g by mouth daily as needed for moderate constipation.   potassium chloride SA (KLOR-CON M) 20 MEQ tablet TAKE ONE TABLET BY MOUTH ONCE DAILY   rosuvastatin (CRESTOR) 10 MG tablet TAKE ONE TABLET AT BEDTIME.   simethicone (GAS-X) 80 MG chewable tablet Chew 1 tablet (80 mg total) by mouth every 6 (six) hours as needed for flatulence.   torsemide (DEMADEX) 20 MG tablet Take 20 mg by mouth 4 (four) times a week. Monday, Tuesday, Thursday, Friday   [DISCONTINUED] carboxymethylcellulose (REFRESH PLUS) 0.5 % SOLN Place 1 drop into both eyes 2 (two) times daily as needed (for dry eyes). (Patient not taking: Reported on 06/11/2022)   [DISCONTINUED] torsemide (DEMADEX) 20 MG tablet TAKE ONE TABLET BY MOUTH DAILY (Patient taking differently: Take 20 tablets by mouth 4 (four) times a week. Patient takes medication Monday, Tuesday, Thursday, Friday)   No facility-administered encounter medications on file as of 06/26/2022.    Review of Systems:  Review of Systems  Constitutional:  Negative for activity change, appetite change, chills, fatigue and fever.  HENT:  Negative for congestion, hearing loss, rhinorrhea and sore throat.   Eyes: Negative.   Respiratory:  Negative for cough, shortness of breath and wheezing.   Cardiovascular:  Negative for chest pain, palpitations and leg swelling.  Gastrointestinal:  Negative for abdominal distention, abdominal pain, constipation, diarrhea, nausea and vomiting.  Genitourinary:  Positive for dysuria, frequency and urgency. Negative for difficulty urinating and flank pain.  Musculoskeletal:  Negative for arthralgias, back pain and myalgias.  Skin:  Negative for color change, rash and wound.  Neurological:  Negative for dizziness,  weakness and headaches.  Psychiatric/Behavioral:  Negative for behavioral problems. The patient is not nervous/anxious.     Health Maintenance  Topic Date Due   Pneumonia Vaccine 33+ Years old (2 - PCV) 10/23/2015   COVID-19 Vaccine (3 - Moderna risk series) 12/14/2019   TETANUS/TDAP  05/09/2020   Zoster Vaccines- Shingrix (2 of 2) 06/19/2021   INFLUENZA VACCINE  05/15/2022   DEXA SCAN  Completed   HPV VACCINES  Aged Out    Physical Exam: Vitals:   06/26/22 1455  BP: 128/74  Pulse: 62  Resp: 18  Temp: (!) 97.3 F (36.3 C)  TempSrc: Temporal  SpO2: 96%  Weight: 125 lb (56.7 kg)  Height: 4\' 11"  (1.499 m)   Body mass index is 25.25 kg/m. Physical Exam Constitutional:  Appearance: Normal appearance.  HENT:     Head: Normocephalic and atraumatic.     Nose: Nose normal.     Mouth/Throat:     Mouth: Mucous membranes are moist.  Eyes:     Conjunctiva/sclera: Conjunctivae normal.  Cardiovascular:     Rate and Rhythm: Normal rate. Rhythm irregular.  Pulmonary:     Effort: Pulmonary effort is normal.     Breath sounds: Normal breath sounds.  Abdominal:     General: Bowel sounds are normal.     Palpations: Abdomen is soft.  Musculoskeletal:        General: Normal range of motion.     Cervical back: Normal range of motion.  Skin:    General: Skin is warm and dry.  Neurological:     General: No focal deficit present.     Mental Status: She is alert and oriented to person, place, and time.  Psychiatric:        Mood and Affect: Mood normal.        Behavior: Behavior normal.        Thought Content: Thought content normal.        Judgment: Judgment normal.     Labs reviewed: Basic Metabolic Panel: Recent Labs    10/17/21 0710 01/29/22 1631 05/01/22 0730  NA 131* 137 137  K 4.8 4.7 4.2  CL 96* 99 99  CO2 26 27 27   GLUCOSE 99 100* 89  BUN 19 16 17   CREATININE 1.14* 0.99 1.06*  CALCIUM 9.5 9.6 9.8   Liver Function Tests: Recent Labs    10/17/21 0710  01/29/22 1631  AST 28 36  ALT 12 14  ALKPHOS  --  65  BILITOT 0.9 1.6*  PROT 7.0 7.5  ALBUMIN  --  3.9   No results for input(s): "LIPASE", "AMYLASE" in the last 8760 hours. No results for input(s): "AMMONIA" in the last 8760 hours. CBC: Recent Labs    10/17/21 0710 01/29/22 1631  WBC 4.6 4.9  NEUTROABS 3,206 3.6  HGB 12.1 12.4  HCT 37.2 39.7  MCV 97.9 103.4*  PLT 189 178   Lipid Panel: No results for input(s): "CHOL", "HDL", "LDLCALC", "TRIG", "CHOLHDL", "LDLDIRECT" in the last 8760 hours. No results found for: "HGBA1C"  Procedures since last visit: No results found.  Assessment/Plan  1. UTI (urinary tract infection) with pyuria - POC Urinalysis Dipstick moderate leukocyte, negative nitrite and negative blood.  - Culture, Urine -  prefers Cipro - ciprofloxacin (CIPRO) 500 MG tablet; Take 1 tablet (500 mg total) by mouth 2 (two) times daily for 7 days.  Dispense: 14 tablet; Refill: 0  2. Gastroesophageal reflux disease, unspecified whether esophagitis present -   continue Pantoprazole -  for endoscopy on 07/03/22  3. Essential hypertension -Blood pressure well controlled Continue current medications  4. Atrial fibrillation, chronic ([place]) -  rate-controlled, continue Eliquis for anticoagulation and Coreg for for rate-control    Labs/tests ordered:  Urine dipstick and culture  Next appt:  11/08/2022

## 2022-06-26 NOTE — Patient Instructions (Signed)
Urinary Tract Infection, Adult  A urinary tract infection (UTI) is an infection of any part of the urinary tract. The urinary tract includes the kidneys, ureters, bladder, and urethra. These organs make, store, and get rid of urine in the body. An upper UTI affects the ureters and kidneys. A lower UTI affects the bladder and urethra. What are the causes? Most urinary tract infections are caused by bacteria in your genital area around your urethra, where urine leaves your body. These bacteria grow and cause inflammation of your urinary tract. What increases the risk? You are more likely to develop this condition if: You have a urinary catheter that stays in place. You are not able to control when you urinate or have a bowel movement (incontinence). You are female and you: Use a spermicide or diaphragm for birth control. Have low estrogen levels. Are pregnant. You have certain genes that increase your risk. You are sexually active. You take antibiotic medicines. You have a condition that causes your flow of urine to slow down, such as: An enlarged prostate, if you are female. Blockage in your urethra. A kidney stone. A nerve condition that affects your bladder control (neurogenic bladder). Not getting enough to drink, or not urinating often. You have certain medical conditions, such as: Diabetes. A weak disease-fighting system (immunesystem). Sickle cell disease. Gout. Spinal cord injury. What are the signs or symptoms? Symptoms of this condition include: Needing to urinate right away (urgency). Frequent urination. This may include small amounts of urine each time you urinate. Pain or burning with urination. Blood in the urine. Urine that smells bad or unusual. Trouble urinating. Cloudy urine. Vaginal discharge, if you are female. Pain in the abdomen or the lower back. You may also have: Vomiting or a decreased appetite. Confusion. Irritability or tiredness. A fever or  chills. Diarrhea. The first symptom in older adults may be confusion. In some cases, they may not have any symptoms until the infection has worsened. How is this diagnosed? This condition is diagnosed based on your medical history and a physical exam. You may also have other tests, including: Urine tests. Blood tests. Tests for STIs (sexually transmitted infections). If you have had more than one UTI, a cystoscopy or imaging studies may be done to determine the cause of the infections. How is this treated? Treatment for this condition includes: Antibiotic medicine. Over-the-counter medicines to treat discomfort. Drinking enough water to stay hydrated. If you have frequent infections or have other conditions such as a kidney stone, you may need to see a health care provider who specializes in the urinary tract (urologist). In rare cases, urinary tract infections can cause sepsis. Sepsis is a life-threatening condition that occurs when the body responds to an infection. Sepsis is treated in the hospital with IV antibiotics, fluids, and other medicines. Follow these instructions at home:  Medicines Take over-the-counter and prescription medicines only as told by your health care provider. If you were prescribed an antibiotic medicine, take it as told by your health care provider. Do not stop using the antibiotic even if you start to feel better. General instructions Make sure you: Empty your bladder often and completely. Do not hold urine for long periods of time. Empty your bladder after sex. Wipe from front to back after urinating or having a bowel movement if you are female. Use each tissue only one time when you wipe. Drink enough fluid to keep your urine pale yellow. Keep all follow-up visits. This is important. Contact a health   care provider if: Your symptoms do not get better after 1-2 days. Your symptoms go away and then return. Get help right away if: You have severe pain in  your back or your lower abdomen. You have a fever or chills. You have nausea or vomiting. Summary A urinary tract infection (UTI) is an infection of any part of the urinary tract, which includes the kidneys, ureters, bladder, and urethra. Most urinary tract infections are caused by bacteria in your genital area. Treatment for this condition often includes antibiotic medicines. If you were prescribed an antibiotic medicine, take it as told by your health care provider. Do not stop using the antibiotic even if you start to feel better. Keep all follow-up visits. This is important. This information is not intended to replace advice given to you by your health care provider. Make sure you discuss any questions you have with your health care provider. Document Revised: 05/13/2020 Document Reviewed: 05/13/2020 Elsevier Patient Education  2023 Elsevier Inc.  

## 2022-06-27 LAB — URINE CULTURE
MICRO NUMBER:: 13906483
SPECIMEN QUALITY:: ADEQUATE

## 2022-07-01 NOTE — Progress Notes (Signed)
Urine culture is negative for UTI.

## 2022-07-03 ENCOUNTER — Ambulatory Visit (AMBULATORY_SURGERY_CENTER): Payer: Medicare Other | Admitting: Gastroenterology

## 2022-07-03 ENCOUNTER — Encounter: Payer: Self-pay | Admitting: Gastroenterology

## 2022-07-03 VITALS — BP 136/63 | HR 69 | Temp 96.0°F | Resp 14 | Ht 59.0 in | Wt 126.0 lb

## 2022-07-03 DIAGNOSIS — K3189 Other diseases of stomach and duodenum: Secondary | ICD-10-CM | POA: Diagnosis not present

## 2022-07-03 DIAGNOSIS — K319 Disease of stomach and duodenum, unspecified: Secondary | ICD-10-CM | POA: Diagnosis not present

## 2022-07-03 DIAGNOSIS — R110 Nausea: Secondary | ICD-10-CM

## 2022-07-03 DIAGNOSIS — K225 Diverticulum of esophagus, acquired: Secondary | ICD-10-CM

## 2022-07-03 DIAGNOSIS — R12 Heartburn: Secondary | ICD-10-CM | POA: Diagnosis not present

## 2022-07-03 DIAGNOSIS — K2970 Gastritis, unspecified, without bleeding: Secondary | ICD-10-CM | POA: Diagnosis not present

## 2022-07-03 DIAGNOSIS — R140 Abdominal distension (gaseous): Secondary | ICD-10-CM

## 2022-07-03 DIAGNOSIS — R630 Anorexia: Secondary | ICD-10-CM

## 2022-07-03 DIAGNOSIS — K2990 Gastroduodenitis, unspecified, without bleeding: Secondary | ICD-10-CM

## 2022-07-03 MED ORDER — SODIUM CHLORIDE 0.9 % IV SOLN: 500.0000 mL | Freq: Once | INTRAVENOUS | Status: DC

## 2022-07-03 NOTE — Progress Notes (Signed)
VS completed by CW.   Pt's states no medical or surgical changes since previsit or office visit.  

## 2022-07-03 NOTE — Progress Notes (Signed)
GASTROENTEROLOGY PROCEDURE H&P NOTE   Primary Care Physician: Virgie Dad, MD    Reason for Procedure:   Nausea, bloating, decreased appetite, GERD, heartburn  Plan:    EGD  Patient is appropriate for endoscopic procedure(s) in the ambulatory ([place]) setting.  The nature of the procedure, as well as the risks, benefits, and alternatives were carefully and thoroughly reviewed with the patient. Ample time for discussion and questions allowed. The patient understood, was satisfied, and agreed to proceed.     HPI: [name] is a 88 y.o. female who presents for EGD for evaluation of Nausea, bloating, decreased appetite, GERD, heartburn .  Patient was most recently seen in the Gastroenterology Clinic on 06/11/2022.  No interval change in medical history since that appointment. Please refer to that note for full details regarding GI history and clinical presentation.   Last dose of Eliquis was yesterday. Holding dose today for short hold per recommendation from Cardiology service.    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   Right lower lobectomy   Diverticulitis 1999   Femur fracture ([place]) 03/2013   left, non weight bearing for 2 1/2 weeks   GERD (gastroesophageal reflux disease)    Hemorrhoids 1991   HTN (hypertension)    Lymphedema of upper extremity    right arm   Osteoporosis    PONV (postoperative nausea and vomiting)    Urinary, incontinence, stress female    Vitamin D deficiency disease     Past Surgical History:  Procedure Laterality Date   APPENDECTOMY     BREAST BIOPSY  05/04/1987   Right Dr Annamaria Boots   BREAST IMPLANT REMOVAL Right 06/05/2010   BUNIONECTOMY  10/15/1986   carcinoid Right    Right lower lobectomy 2017   COLONOSCOPY     COLONOSCOPY W/  POLYPECTOMY     EYE SURGERY  5/14 ; 6/14   Cataract Surgery   JOINT REPLACEMENT     2002 left; 2009 right Hip, rt knee 2012   KNEE ARTHROPLASTY Right 10/15/2010   [name]   LOBECTOMY Right 01/05/2016   Procedure: RIGHT LOWER LOBE LOBECTOMY;  Surgeon: Mel[place] Nakayama, MD;  Location: [facility];  Service: Thoracic;  Laterality: Right;   MASTECTOMY PARTIAL / LUMPECTOMY W/ AXILLARY LYMPHADENECTOMY  04/05/1987   Right - Dr Annamaria Boots   RESECTION OF MEDIASTINAL MASS N/A 01/05/2016   Procedure: RESECTION OF PLEURAL MASS, right;  Surgeon: [place] Nakayama, MD;  Location: [facility];  Service: Thoracic;  Laterality: N/A;   TONSILLECTOMY     TOTAL HIP ARTHROPLASTY     TUBAL LIGATION Bilateral 10/15/1968   VAGINAL HYSTERECTOMY  10/16/1975   [facility]   VIDEO ASSISTED THORACOSCOPY (VATS)/WEDGE RESECTION Right 01/05/2016   Procedure: RIGHT VIDEO ASSISTED THORACOSCOPY (VATS)/WEDGE RESECTION;  Surgeon: Melrose Nakayama, MD;  Location: [facility];  Service: Thoracic;  Laterality: Right;    Prior to Admission medications   Medication Sig Start Date End Date Taking? Authorizing Provider  acetaminophen (TYLENOL) 500 MG tablet Take 2 tablets (1,000 mg total) by mouth every 6 (six) hours as needed for mild pain. 01/17/16  Yes Lars Pinks M, PA-C  apixaban (ELIQUIS) 2.5 MG TABS tablet TAKE ONE TABLET BY MOUTH TWICE DAILY 02/13/22  Yes O'Neal, [name]  Marcello Moores, MD  bisacodyl (DULCOLAX) 5 MG EC tablet Take 1 tablet (5 mg total) by mouth daily as needed for severe constipation. 06/08/20  Yes Pokhrel, L[place]man, MD  Calcium Carbonate-Vitamin D 600-400 MG-UNIT tablet Take 1 tablet by mouth daily.   Yes [provider]  carvedilol (COREG) 6.25 MG tablet TAKE ONE TABLET BY MOUTH TWICE DAILY AFTER MEALS 05/08/22  Yes Virgie Dad, MD  ciprofloxacin (CIPRO) 500 MG tablet Take 1 tablet (500 mg total) by mouth 2 (two) times daily for 7 days. 06/26/22 07/03/22 Yes Medina-Vargas, Monina C, NP  Ergocalciferol (VITAMIN  D2) 10 MCG (400 UNIT) TABS Take 400 Units by mouth daily. 07/15/20  Yes Mast, Man X, NP  lactose free nutrition (BOOST) LIQD Take 237 mLs by mouth 2 (two) times daily between meals.   Yes [provider]  Multiple Vitamin (MULTIVITAMIN WITH MINERALS) TABS Take 1 tablet by mouth every evening.   Yes [provider]  Omega-3 Fatty Acids (FISH OIL) 1000 MG CAPS Take 1 capsule by mouth 2 (two) times daily.   Yes [provider]  pantoprazole (PROTONIX) 40 MG tablet Take 1 tablet (40 mg total) by mouth 2 (two) times daily. 05/11/22  Yes Mast, Man X, NP  polyethylene glycol (MIRALAX / GLYCOLAX) 17 g packet Take 17 g by mouth daily as needed for moderate constipation. 07/15/20  Yes Mast, Man X, NP  potassium chloride SA (KLOR-CON M) 20 MEQ tablet TAKE ONE TABLET BY MOUTH ONCE DAILY 05/08/22  Yes Virgie Dad, MD  rosuvastatin (CRESTOR) 10 MG tablet TAKE ONE TABLET AT BEDTIME. 12/28/21  Yes O'Neal, Cassie Freer, MD  torsemide (DEMADEX) 20 MG tablet Take 20 mg by mouth 4 (four) times a week. Monday, Tuesday, Thursday, Friday   Yes [provider]  ondansetron (ZOFRAN) 4 MG tablet Take 1 tablet (4 mg total) by mouth every 8 (eight) hours as needed for nausea or vomiting. 02/07/22   Wardell Honour, MD  simethicone (GAS-X) 80 MG chewable tablet Chew 1 tablet (80 mg total) by mouth every 6 (six) hours as needed for flatulence. 02/07/22   Wardell Honour, MD    Current Outpatient Medications  Medication Sig Dispense Refill   acetaminophen (TYLENOL) 500 MG tablet Take 2 tablets (1,000 mg total) by mouth every 6 (six) hours as needed for mild pain. 30 tablet 0   apixaban (ELIQUIS) 2.5 MG TABS tablet TAKE ONE TABLET BY MOUTH TWICE DAILY 180 tablet 3   bisacodyl (DULCOLAX) 5 MG EC tablet Take 1 tablet (5 mg total) by mouth daily as needed for severe constipation.     Calcium Carbonate-Vitamin D 600-400 MG-UNIT tablet Take 1 tablet by mouth daily.     carvedilol (COREG) 6.25 MG  tablet TAKE ONE TABLET BY MOUTH TWICE DAILY AFTER MEALS 60 tablet 5   ciprofloxacin (CIPRO) 500 MG tablet Take 1 tablet (500 mg total) by mouth 2 (two) times daily for 7 days. 14 tablet 0   Ergocalciferol (VITAMIN D2) 10 MCG (400 UNIT) TABS Take 400 Units by mouth daily. 30 tablet 0   lactose free nutrition (BOOST) LIQD Take 237 mLs by mouth 2 (two) times daily between meals.     Multiple Vitamin (MULTIVITAMIN WITH MINERALS) TABS Take 1 tablet by mouth every evening.     Omega-3 Fatty Acids (FISH OIL) 1000 MG CAPS Take 1 capsule by mouth 2 (two) times daily.     pantoprazole (PROTONIX) 40 MG tablet Take 1 tablet (40 mg total) by  mouth 2 (two) times daily. 60 tablet 3   polyethylene glycol (MIRALAX / GLYCOLAX) 17 g packet Take 17 g by mouth daily as needed for moderate constipation. 14 each 2   potassium chloride SA (KLOR-CON M) 20 MEQ tablet TAKE ONE TABLET BY MOUTH ONCE DAILY 90 tablet 5   rosuvastatin (CRESTOR) 10 MG tablet TAKE ONE TABLET AT BEDTIME. 90 tablet 3   torsemide (DEMADEX) 20 MG tablet Take 20 mg by mouth 4 (four) times a week. Monday, Tuesday, Thursday, Friday     ondansetron (ZOFRAN) 4 MG tablet Take 1 tablet (4 mg total) by mouth every 8 (eight) hours as needed for nausea or vomiting. 12 tablet 0   simethicone (GAS-X) 80 MG chewable tablet Chew 1 tablet (80 mg total) by mouth every 6 (six) hours as needed for flatulence. 30 tablet 0   Current Facility-Administered Medications  Medication Dose Route Frequency Provider Last Rate Last Admin   0.9 %  sodium chloride infusion  500 mL Intravenous Once Makyah Lavigne V, DO        Allergies as of 07/03/2022 - Review Complete 07/03/2022  Allergen Reaction Noted   Aspirin Rash 08/25/2007    Family History  Problem Relation Age of Onset   Colon cancer Mother    Colon cancer Father    Heart disease Father    Stomach cancer Neg Hx    Liver cancer Neg Hx    Esophageal cancer Neg Hx     Social History   Socioeconomic History    Marital status: Widowed    Spouse name: Not on file   Number of children: Not on file   Years of education: Not on file   Highest education level: Not on file  Occupational History   Not on file  Tobacco Use   Smoking status: Never   Smokeless tobacco: Never  Vaping Use   Vaping Use: Never used  Substance and Sexual Activity   Alcohol use: No    Alcohol/week: 0.0 standard drinks of alcohol   Drug use: No   Sexual activity: Not Currently    Partners: Male    Birth control/protection: Surgical    Comment: hysterectomy  Other Topics Concern   Not on file  Social History Narrative   Not on file   Social Determinants of Health   Financial Resource Strain: Not on file  Food Insecurity: Not on file  Transportation Needs: Not on file  Physical Activity: Not on file  Stress: Not on file  Social Connections: Not on file  Intimate Partner Violence: Not on file    Physical Exam: Vital signs in last 24 hours: @BP  (!) 153/90   Pulse 73   Temp (!) 96 F (35.6 C) (Temporal)   Ht 4\' 11"  (1.499 m)   Wt 126 lb (57.2 kg)   LMP 10/16/1975 (Approximate)   SpO2 96%   BMI 25.45 kg/m  GEN: NAD EYE: Sclerae anicteric ENT: MMM CV: Non-tachycardic Pulm: CTA b/l GI: Soft, NT/ND NEURO:  Alert & Oriented x [place], DO [facility]   07/03/2022 10:18 AM

## 2022-07-03 NOTE — Op Note (Signed)
[facility] Patient Name: [name] Procedure Date: 07/03/2022 10:10 AM MRN: [id number] Endoscopist: Gerrit Heck , MD Age: 88 Referring MD:  Date of Birth: 08-09-1934 Gender: Female Account #: [id number] Procedure:                Upper GI endoscopy Indications:              Heartburn, Suspected esophageal reflux, Abdominal                            bloating, Nausea, Decreased appetite Medicines:                Monitored Anesthesia Care Procedure:                Pre-Anesthesia Assessment:                           - Prior to the procedure, a History and Physical                            was performed, and patient medications and                            allergies were reviewed. The patient's tolerance of                            previous anesthesia was also reviewed. The risks                            and benefits of the procedure and the sedation                            options and risks were discussed with the patient.                            All questions were answered, and informed consent                            was obtained. Prior Anticoagulants: The patient has                            taken Eliquis (apixaban), last dose was 1 day prior                            to procedure. ASA Grade Assessment: III - A patient                            with severe systemic disease. After reviewing the                            risks and benefits, the patient was deemed in                            satisfactory condition to undergo the procedure.  After obtaining informed consent, the endoscope was                            passed under direct vision. Throughout the                            procedure, the patient's blood pressure, pulse, and                            oxygen saturations were monitored continuously. The                            GIF [id number] #[id number] was introduced through the                            mouth,  and advanced to the third part of duodenum.                            The upper GI endoscopy was accomplished without                            difficulty. The patient tolerated the procedure                            well. Scope In: Scope Out: Findings:                 The examined esophagus was normal.                           The Z-line was regular and was found 38 cm from the                            incisors.                           Localized mild inflammation characterized by                            congestion (edema) and erythema was found in the                            gastric body. Biopsies were taken with a cold                            forceps for histology. Estimated blood loss was                            minimal.                           The cardia, gastric fundus, incisura, gastric                            antrum and pylorus were normal.  A small non-bleeding diverticulum was found in the                            second portion of the duodenum.                           Normal mucosa was found in the duodenal bulb, in                            the first portion of the duodenum, in the second                            portion of the duodenum and in the third portion of                            the duodenum. Biopsies were taken with a cold                            forceps for histology. Estimated blood loss was                            minimal. Complications:            No immediate complications. Estimated Blood Loss:     Estimated blood loss was minimal. Impression:               - Normal esophagus.                           - Z-line regular, 38 cm from the incisors.                           - Gastritis. Biopsied.                           - Normal cardia, gastric fundus, incisura, antrum                            and pylorus.                           - Non-bleeding duodenal diverticulum.                            - Normal mucosa was found in the duodenal bulb, in                            the first portion of the duodenum, in the second                            portion of the duodenum and in the third portion of                            the duodenum. Biopsied. Recommendation:           - Patient has a  contact number available for                            emergencies. The signs and symptoms of potential                            delayed complications were discussed with the                            patient. Return to normal activities tomorrow.                            Written discharge instructions were provided to the                            patient.                           - Resume previous diet.                           - Continue present medications.                           - Await pathology results.                           - Resume Eliquis (apixaban) at prior dose tomorrow.                           - If biopsies unrevealing and continued symptoms,                            can trial course of OTC FDGuard. Gerrit Heck, MD 07/03/2022 10:43:41 AM

## 2022-07-03 NOTE — Progress Notes (Signed)
Called to room to assist during endoscopic procedure.  Patient ID and intended procedure confirmed with present staff. Received instructions for my participation in the procedure from the performing physician.  

## 2022-07-03 NOTE — Progress Notes (Signed)
Pt in recovery with monitors in place, VSS. Report given to receiving RN. Bite guard was placed with pt awake to ensure comfort. No dental or soft tissue damage noted. 

## 2022-07-03 NOTE — Patient Instructions (Signed)
YOU HAD AN ENDOSCOPIC PROCEDURE TODAY AT [facility]:   Refer to the procedure report that was given to you for any specific questions about what was found during the examination.  If the procedure report does not answer your questions, please call your gastroenterologist to clarify.  If you requested that your care partner not be given the details of your procedure findings, then the procedure report has been included in a sealed envelope for you to review at your convenience later.  YOU SHOULD EXPECT: Some feelings of bloating in the abdomen. Passage of more gas than usual.  Walking can help get rid of the air that was put into your GI tract during the procedure and reduce the bloating. If you had a lower endoscopy (such as a colonoscopy or flexible sigmoidoscopy) you may notice spotting of blood in your stool or on the toilet paper. If you underwent a bowel prep for your procedure, you may not have a normal bowel movement for a few days.  Please Note:  You might notice some irritation and congestion in your nose or some drainage.  This is from the oxygen used during your procedure.  There is no need for concern and it should clear up in a day or so.  SYMPTOMS TO REPORT IMMEDIATELY:  Following upper endoscopy (EGD)  Vomiting of blood or coffee ground material  New chest pain or pain under the shoulder blades  Painful or persistently difficult swallowing  New shortness of breath  Fever of 100F or higher  Black, tarry-looking stools  For urgent or emergent issues, a gastroenterologist can be reached at any hour by calling [phone]. Do not use MyChart messaging for urgent concerns.    DIET:  We do recommend a small meal at first, but then you may proceed to your regular diet.  Drink plenty of fluids but you should avoid alcoholic beverages for 24 hours.  MEDICATIONS: Continue present medications. Resume Eliquis (Apixaban) at prior dose tomorrow (Wednesday).  If the  biopsies are revealing and your symptoms persist, we can try a course of over the counter FDGuard, but will await pathology results to determine this.  Please see handouts given to you by your recovery nurse.  Thank you for allowing [place] to provide for your healthcare needs today.  ACTIVITY:  You should plan to take it easy for the rest of today and you should NOT DRIVE or use heavy machinery until tomorrow (because of the sedation medicines used during the test).    FOLLOW UP: Our staff will call the number listed on your records the next business day following your procedure.  We will call around 7:15- 8:00 am to check on you and address any questions or concerns that you may have regarding the information given to you following your procedure. If we do not reach you, we will leave a message.     If any biopsies were taken you will be contacted by phone or by letter within the next 1-3 weeks.  Please call us at [phone] if you have not heard about the biopsies in 3 weeks.    SIGNATURES/CONFIDENTIALITY: You and/or your care partner have signed paperwork which will be entered into your electronic medical record.  These signatures attest to the fact that that the information above on your After Visit Summary has been reviewed and is understood.  Full responsibility of the confidentiality of this discharge information lies with you and/or your care-partner.

## 2022-07-04 ENCOUNTER — Telehealth: Payer: Self-pay | Admitting: *Deleted

## 2022-07-04 NOTE — Telephone Encounter (Signed)
  Follow up Call-     07/03/2022    9:16 AM  Call back number  Post procedure Call Back phone  # [phone]  Permission to leave phone message Yes     Patient questions:  Do you have a fever, pain , or abdominal swelling? No. Pain Score  0 *  Have you tolerated food without any problems? Yes.    Have you been able to return to your normal activities? Yes.    Do you have any questions about your discharge instructions: Diet   No. Medications  No. Follow up visit  No.  Do you have questions or concerns about your Care? No.  Actions: * If pain score is 4 or above: No action needed, pain <4.

## 2022-07-10 ENCOUNTER — Encounter: Payer: Self-pay | Admitting: Gastroenterology

## 2022-07-12 ENCOUNTER — Telehealth: Payer: Self-pay | Admitting: Gastroenterology

## 2022-07-12 NOTE — Telephone Encounter (Signed)
Spoke with pt and gave pt biopsy results. Pt verbalized understanding and stated she is currently taking pantoprazole 40 mg twice a day and wanted to know how long she should continue taking medication twice a day. Pt reports that most of her symptoms have improved but she still has some nausea. Pt requested that I leave a detailed message if she is unable to pick up the phone.

## 2022-07-12 NOTE — Telephone Encounter (Signed)
Continue twice daily for 6 weeks, then reduce to daily. If symptoms controlled, can reduce to 20 mg/day.

## 2022-07-12 NOTE — Telephone Encounter (Signed)
Inbound call from patient calling in regards to previous procedure she had 9/19. Patient states she does not understand the medical terms on her report and would like for a provider to call her back or message her via mychart so that she understands. Please give a call to further advise.  Thank you

## 2022-07-12 NOTE — Telephone Encounter (Signed)
Gave pt recommendations, pt verbalized understanding.

## 2022-09-13 ENCOUNTER — Telehealth: Payer: Self-pay | Admitting: Pharmacy Technician

## 2022-09-13 ENCOUNTER — Encounter: Payer: Self-pay | Admitting: Gastroenterology

## 2022-09-13 ENCOUNTER — Telehealth: Payer: Self-pay

## 2022-09-13 ENCOUNTER — Ambulatory Visit (INDEPENDENT_AMBULATORY_CARE_PROVIDER_SITE_OTHER): Payer: Medicare Other | Admitting: Gastroenterology

## 2022-09-13 VITALS — BP 100/64 | HR 66 | Ht 59.0 in | Wt 124.0 lb

## 2022-09-13 DIAGNOSIS — K589 Irritable bowel syndrome without diarrhea: Secondary | ICD-10-CM | POA: Diagnosis not present

## 2022-09-13 DIAGNOSIS — R110 Nausea: Secondary | ICD-10-CM | POA: Diagnosis not present

## 2022-09-13 DIAGNOSIS — R140 Abdominal distension (gaseous): Secondary | ICD-10-CM | POA: Diagnosis not present

## 2022-09-13 DIAGNOSIS — K5900 Constipation, unspecified: Secondary | ICD-10-CM | POA: Diagnosis not present

## 2022-09-13 DIAGNOSIS — K219 Gastro-esophageal reflux disease without esophagitis: Secondary | ICD-10-CM

## 2022-09-13 MED ORDER — RIFAXIMIN 550 MG PO TABS: ORAL_TABLET | ORAL | 1 refills | Status: DC

## 2022-09-13 NOTE — Telephone Encounter (Signed)
Can we obtain Prior Auth for Xifaxan 550 MG for this patient please? Dx: IBS

## 2022-09-13 NOTE — Progress Notes (Signed)
Chief Complaint:    Constipation, bloating, nausea  GI History: [name] is a 88 y.o. 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  CXR with possible pneumonia vs effusion, CT PE with possible pneumonia.  Treated with antibiotics - 03/13/2022: RUQ [place]: Cirrhotic liver morphology. Otherwise normal gallbladder, CBD. 2 simple hepatic cysts measuring up to 1.5 cm.  - 06/03/2022: Initial appointment GI clinic for evaluation of GERD.  Longstanding history of reflux with index symptoms of heartburn, hoarseness, increased throat clearing.  Has been taking Protonix 40 mg/day for 3 years.  Starting in 01/2022, noted reduced appetite, nausea, weakness, bloating.  Bloating improved with OTC simethicone.  Reflux symptoms improved with trial of high-dose PPI.  Separately, history of chronic constipation well controlled with MiraLAX daily x3 years.  Recommended EGD. Trialed Protonix 40 mg BID x6 weeks.  - 07/03/2022: EGD: Normal esophagus, mild nonulcer gastritis (path benign, no H. pylori), small duodenal diverticulum with otherwise normal duodenum (path benign)  Family history notable for both mother and father with colon cancer.   Previously followed by Dr. Oletta Lamas at [place], last seen 02/23/2019 evaluation of possible early cirrhosis, thrombocytopenia.  Thought at that time was while she does have some possible changes of cirrhosis on imaging, it was nonprogressive over serial imaging.  Liver enzymes otherwise normal.   Recommended repeat CT liver three-phase to evaluate for signs of portal hypertension and held off on EGD, partly due to COVID-19 pandemic.   - 08/23/2008: Colonoscopy: No report available for review - 12/29/2013: Colonoscopy: Adenomatous polyp.  Recall not recommended due to age stated recommended FOBT testing   - 06/01/2016: Abdominal ultrasound: Multiple hepatic cysts, grossly similar to previous CT.  Nodular liver contour -06/02/2016: CT A/P perforated ileal diverticulum, likely Meckel's in location with regional enteritis.  Lobulated liver surface compatible with cirrhosis.  Scattered hepatic cysts - 05/22/2018: Dotatate scan for pulmonary carcinoid: Very low dotatate activity in lobular nodules at lung base.  No evidence of malignancy outside of lungs - 11/25/2018: RUQ [place]: Mildly nodular liver contour, multiple small hepatic cysts - 02/20/2020: Nuc med bleeding scan: Negative/no bleeding -06/02/2019: CT A/P: Irregular, nodular liver contour, unchanged 1.5 cm right lobe cyst, unchanged 5 mm left lobe cyst.  No duct dilation   HPI:     Patient is a 88 y.o. female presenting to the Gastroenterology Clinic for follow-up.   Still having abdominal bloating.  Bloating is responsive to simethicone, but returns shortly later.  Also with continued intermittent nausea.  Also tends to have nausea, lightheadedness immediately after BM.  Does endorse straining to have BM.  Reflux symptoms were improved with 6-week trial of high-dose PPI.  Has since reduced Protonix to 40 mg/day and still overall control.     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   Right lower lobectomy   Diverticulitis 1999   Femur fracture ([place]) 03/2013   left,  non weight bearing for 2 1/2 weeks   GERD (gastroesophageal reflux disease)    Hemorrhoids 1991   HTN (hypertension)    Lymphedema of upper extremity    right arm   Osteoporosis    PONV (postoperative nausea and vomiting)    Urinary, incontinence, stress female    Vitamin D deficiency disease     Patient's surgical history, family medical history, social history, medications and allergies were all reviewed in Epic    Current Outpatient Medications  Medication Sig Dispense Refill   acetaminophen (TYLENOL) 500 MG tablet Take 2 tablets (1,000 mg total) by mouth every 6 (six) hours as needed for mild pain. 30 tablet 0   apixaban (ELIQUIS) 2.5 MG TABS tablet TAKE ONE TABLET BY MOUTH TWICE DAILY 180 tablet 3   bisacodyl (DULCOLAX) 5 MG EC tablet Take 1 tablet (5 mg total) by mouth daily as needed for severe constipation.     Calcium Carbonate-Vitamin D 600-400 MG-UNIT tablet Take 1 tablet by mouth daily.     carvedilol (COREG) 6.25 MG tablet TAKE ONE TABLET BY MOUTH TWICE DAILY AFTER MEALS 60 tablet 5   Ergocalciferol (VITAMIN D2) 10 MCG (400 UNIT) TABS Take 400 Units by mouth daily. 30 tablet 0   lactose free nutrition (BOOST) LIQD Take 237 mLs by mouth 2 (two) times daily between meals.     Multiple Vitamin (MULTIVITAMIN WITH MINERALS) TABS Take 1 tablet by mouth every evening.     Omega-3 Fatty Acids (FISH OIL) 1000 MG CAPS Take 1 capsule by mouth 2 (two) times daily.     pantoprazole (PROTONIX) 40 MG tablet Take 1 tablet (40 mg total) by mouth 2 (two) times daily. 60 tablet 3   polyethylene glycol (MIRALAX / GLYCOLAX) 17 g packet Take 17 g by mouth daily as needed for moderate constipation. 14 each 2   potassium chloride SA (KLOR-CON M) 20 MEQ tablet TAKE ONE TABLET BY MOUTH ONCE DAILY 90 tablet 5   rosuvastatin (CRESTOR) 10 MG tablet TAKE ONE TABLET AT BEDTIME. 90 tablet 3   simethicone (GAS-X) 80 MG chewable tablet Chew 1 tablet (80 mg total) by mouth every 6 (six) hours as needed for  flatulence. 30 tablet 0   torsemide (DEMADEX) 20 MG tablet Take 20 mg by mouth 4 (four) times a week. Monday, Tuesday, Thursday, Friday     ondansetron (ZOFRAN) 4 MG tablet Take 1 tablet (4 mg total) by mouth every 8 (eight) hours as needed for nausea or vomiting. 12 tablet 0   Current Facility-Administered Medications  Medication Dose Route Frequency Provider Last Rate Last Admin   0.9 %  sodium chloride infusion  500 mL Intravenous Once Kailena Lubas V, DO        Physical Exam:     BP 100/64   Pulse 66   Ht 4\' 11"  (1.499 m)   Wt 124 lb (56.2 kg)   LMP 10/16/1975 (Approximate)   BMI 25.04 kg/m   GENERAL:  Pleasant female in NAD PSYCH: : Cooperative, normal affect EENT:  conjunctiva pink, mucous membranes moist, neck supple without masses CARDIAC:  RRR, no murmur heard, no peripheral edema PULM: Normal respiratory effort, lungs CTA bilaterally, no wheezing ABDOMEN:  Nondistended, soft, nontender. No obvious masses, no hepatomegaly,  normal bowel sounds SKIN:  turgor, no lesions seen Musculoskeletal:  Normal muscle tone, normal strength NEURO:  Alert and oriented x 3, no focal neurologic deficits   IMPRESSION and PLAN:    1) Nausea 2) Bloating While with typical reflux symptoms are improved with trial of high-dose PPI, her continued nausea and bloating are suspicious for SIBO.  Discussed diagnostic and treatment options with plan as follows: - Trial course of rifaximin 550 mg tid x14 days, RF 1 - Start probiotic (i.e. align) and continue at least 4 weeks beyond completion of rifaximin course - Ok to continue simethicone as needed - Medications [facility] be called [facility] which [facility] directly into her facility  3) Constipation Longstanding history of constipation, but on her description today sounds like she also has a strong component of pelvic floor dyssynergia.  Additionally, her constipation/straining is resulting in vasovagal symptoms.  Plan for the  following: - Referral to pelvic floor PT for biofeedback and pelvic floor retraining - Continue MiraLAX to maintain soft stools and limit straining to BM - Depending on response to pelvic floor PT, may also consider referral to Colorectal Surgery for possible component of prolapse  4) Irritable bowel syndrome - Treating with course of rifaximin, probiotics as above  5) GERD - Continue Protonix   RTC 3 months or sooner prn           Lavena Bullion ,DO, FACG 09/13/2022, 3:36 PM

## 2022-09-13 NOTE — Patient Instructions (Addendum)
We have sent the following medications to your pharmacy for you to pick up at your convenience: Xifaxan 550 mg  We have placed a referral to Pelvic Floor Therapy.  Take over the counter probiotic [employer], systems ) daily.   If you are age 86 or older, your body mass index should be between 23-30. Your Body mass index is 25.04 kg/m. If this is out of the aforementioned range listed, please consider follow up with your Primary Care Provider.  __________________________________________________________  The [facility] providers would like to encourage you to use [facility] to communicate with providers for non-urgent requests or questions.  Due to long hold times on the telephone, sending your provider a message by [facility] may be a faster and more efficient way to get a response.  Please allow 48 business hours for a response.  Please remember that this is for non-urgent requests.   Due to recent changes in healthcare laws, you may see the results of your imaging and laboratory studies on MyChart before your provider has had a chance to review them.  We understand that in some cases there may be results that are confusing or concerning to you. Not all laboratory results come back in the same time frame and the provider may be waiting for multiple results in order to interpret others.  Please give [place] 48 hours in order for your provider to thoroughly review all the results before contacting the office for clarification of your results.     Thank you for choosing me and [facility].  Vito Cirigliano, D.O.

## 2022-09-13 NOTE — Telephone Encounter (Deleted)
Patient Advocate Encounter  Received notification from [facility] that prior authorization for XIFAXAN 550MG  is required.   PA submitted on 11.30.23 Key [id number] Status is pending

## 2022-09-19 ENCOUNTER — Telehealth: Payer: Self-pay | Admitting: Pharmacy Technician

## 2022-09-19 ENCOUNTER — Other Ambulatory Visit (HOSPITAL_COMMUNITY): Payer: Self-pay

## 2022-09-19 NOTE — Telephone Encounter (Signed)
error 

## 2022-09-19 NOTE — Telephone Encounter (Signed)
Received a fax regarding Prior Authorization from [employer] for XIFAXAN 550MG . Authorization has been DENIED because PT MUST HAVE A TRY/FAIL OF LOPERAMIDE.      Received notification from [facility] that prior authorization for XIFAXAN 550MG  is required.   PA submitted on 11.30.23 Key [id number] Status is pending

## 2022-09-20 NOTE — Telephone Encounter (Signed)
Do we have any samples available?  If not, can we please try to resubmit for treatment of SIBO?  Loperamide would be effective if we are trying to treat diarrhea, but this is targeting SIBO.  Thanks.

## 2022-09-20 NOTE — Telephone Encounter (Signed)
Dr. Bryan Lemma, we do not have any samples at this time. Hi [place], please see Dr. Bryan Lemma recommendations. Thank you.

## 2022-09-24 NOTE — Telephone Encounter (Signed)
Patient returning call for Xifaxan update.It was denied by insurance. Wants to know should she eliminate the medication from her regimen or do you all have another option. She advise that Alliance is working well for her. Please advise.

## 2022-09-25 NOTE — Telephone Encounter (Signed)
Patient Advocate Encounter  Received notification TO RESUBMIT WITH Dx CODE FOR SIBO [id number] for XIFAXAN 550MG  is required.   PA submitted on 12.12.23 Key [id number] Status is pending    [name], CPhT Patient Advocate Phone: [phone]

## 2022-10-01 ENCOUNTER — Ambulatory Visit (INDEPENDENT_AMBULATORY_CARE_PROVIDER_SITE_OTHER): Payer: Medicare Other | Admitting: Adult Health

## 2022-10-01 ENCOUNTER — Encounter: Payer: Self-pay | Admitting: Adult Health

## 2022-10-01 VITALS — BP 128/80 | HR 79 | Temp 97.5°F | Resp 18 | Ht 59.0 in | Wt 126.0 lb

## 2022-10-01 DIAGNOSIS — E871 Hypo-osmolality and hyponatremia: Secondary | ICD-10-CM

## 2022-10-01 DIAGNOSIS — I10 Essential (primary) hypertension: Secondary | ICD-10-CM

## 2022-10-01 DIAGNOSIS — R3989 Other symptoms and signs involving the genitourinary system: Secondary | ICD-10-CM | POA: Diagnosis not present

## 2022-10-01 LAB — POCT URINALYSIS DIPSTICK
Appearance: NORMAL
Bilirubin, UA: NEGATIVE
Blood, UA: NEGATIVE
Glucose, UA: NEGATIVE
Ketones, UA: NEGATIVE
Leukocytes, UA: NEGATIVE
Nitrite, UA: NEGATIVE
Protein, UA: NEGATIVE
Spec Grav, UA: 1.015 (ref 1.010–1.025)
Urobilinogen, UA: NEGATIVE E.U./dL — AB
pH, UA: 5 (ref 5.0–8.0)

## 2022-10-01 NOTE — Progress Notes (Signed)
[facility] clinic  Provider:  Durenda Age DNP  Code Status:  DNR  Goals of Care:     05/10/2022   11:07 AM  Advanced Directives  Does Patient Have a Medical Advance Directive? Yes  Type of Advance Directive Living will  Does patient want to make changes to medical advance directive? No - Patient declined     Chief Complaint  Patient presents with   Acute Visit    Patient is being seen for possible UTI    HPI: Patient is a 88 y.o. female seen today for an acute visit for possible UTI.  She has noted a decreased in urine output X 1 week so she increased her water intake. She is here today to make sure she does not have UTI. She said that she always has low back pain. She denies fever nor chills nor hematuria. She feels bloated daily but denies constipation.  Urine dipstick showed negative nitrate, leukocyte, glucose, blood and ketones.  BP 128/80, takes Coreg for hypertension.  Past Medical History:  Diagnosis Date   Arthritis 1996   Atrial fibrillation ([place])    on coumadin   Atrial tachycardia    Cancer ([place]) 1988   breast cancer, R mastectomy, chemo   Carcinoid bronchial adenoma of right lung ([place]) 01/05/2016   Right lower lobectomy   Diverticulitis 1999   Femur fracture ([place]) 03/2013   left, non weight bearing for 2 1/2 weeks   GERD (gastroesophageal reflux disease)    Hemorrhoids 1991   HTN (hypertension)    Lymphedema of upper extremity    right arm   Osteoporosis    PONV (postoperative nausea and vomiting)    Urinary, incontinence, stress female    Vitamin D deficiency disease     Past Surgical History:  Procedure Laterality Date   APPENDECTOMY     BREAST BIOPSY  05/04/1987   Right Dr Annamaria Boots   BREAST IMPLANT REMOVAL Right 06/05/2010   BUNIONECTOMY  10/15/1986   carcinoid Right    Right lower lobectomy 2017   COLONOSCOPY     COLONOSCOPY W/ POLYPECTOMY     EYE SURGERY  5/14 ; 6/14   Cataract Surgery   JOINT REPLACEMENT     2002 left; 2009 right  Hip, rt knee 2012   KNEE ARTHROPLASTY Right 10/15/2010   [name]   LOBECTOMY Right 01/05/2016   Procedure: RIGHT LOWER LOBE LOBECTOMY;  Surgeon: Melrose Nakayama, MD;  Location: [facility];  Service: Thoracic;  Laterality: Right;   MASTECTOMY PARTIAL / LUMPECTOMY W/ AXILLARY LYMPHADENECTOMY  04/05/1987   Right - Dr Annamaria Boots   RESECTION OF MEDIASTINAL MASS N/A 01/05/2016   Procedure: RESECTION OF PLEURAL MASS, right;  Surgeon: Melrose Nakayama, MD;  Location: [facility];  Service: Thoracic;  Laterality: N/A;   TONSILLECTOMY     TOTAL HIP ARTHROPLASTY     TUBAL LIGATION Bilateral 10/15/1968   VAGINAL HYSTERECTOMY  10/16/1975   myomata   VIDEO ASSISTED THORACOSCOPY (VATS)/WEDGE RESECTION Right 01/05/2016   Procedure: RIGHT VIDEO ASSISTED THORACOSCOPY (VATS)/WEDGE RESECTION;  Surgeon: Melrose Nakayama, MD;  Location: [facility];  Service: Thoracic;  Laterality: Right;    Allergies  Allergen Reactions   Aspirin Rash    Outpatient Encounter Medications as of 10/01/2022  Medication Sig   acetaminophen (TYLENOL) 500 MG tablet Take 2 tablets (1,000 mg total) by mouth every 6 (six) hours as needed for mild pain.   apixaban (ELIQUIS) 2.5 MG TABS tablet TAKE ONE TABLET BY MOUTH TWICE  DAILY   bisacodyl (DULCOLAX) 5 MG EC tablet Take 1 tablet (5 mg total) by mouth daily as needed for severe constipation.   Calcium Carbonate-Vitamin D 600-400 MG-UNIT tablet Take 1 tablet by mouth daily.   carvedilol (COREG) 6.25 MG tablet TAKE ONE TABLET BY MOUTH TWICE DAILY AFTER MEALS   Ergocalciferol (VITAMIN D2) 10 MCG (400 UNIT) TABS Take 400 Units by mouth daily.   lactose free nutrition (BOOST) LIQD Take 237 mLs by mouth 2 (two) times daily between meals.   Multiple Vitamin (MULTIVITAMIN WITH MINERALS) TABS Take 1 tablet by mouth every evening.   Omega-3 Fatty Acids (FISH OIL) 1000 MG CAPS Take 1 capsule by mouth 2 (two) times daily.   pantoprazole (PROTONIX) 40 MG tablet Take 1 tablet (40 mg total) by mouth  2 (two) times daily.   polyethylene glycol (MIRALAX / GLYCOLAX) 17 g packet Take 17 g by mouth daily as needed for moderate constipation.   potassium chloride SA (KLOR-CON M) 20 MEQ tablet TAKE ONE TABLET BY MOUTH ONCE DAILY   rosuvastatin (CRESTOR) 10 MG tablet TAKE ONE TABLET AT BEDTIME.   simethicone (GAS-X) 80 MG chewable tablet Chew 1 tablet (80 mg total) by mouth every 6 (six) hours as needed for flatulence.   torsemide (DEMADEX) 20 MG tablet Take 20 mg by mouth 4 (four) times a week. Monday, Tuesday, Thursday, Friday   [DISCONTINUED] ondansetron (ZOFRAN) 4 MG tablet Take 1 tablet (4 mg total) by mouth every 8 (eight) hours as needed for nausea or vomiting.   [DISCONTINUED] rifaximin (XIFAXAN) 550 MG TABS tablet Take 1 tablet (550 MG ) three times daily for IBS for 14 days.   Facility-Administered Encounter Medications as of 10/01/2022  Medication   0.9 %  sodium chloride infusion    Review of Systems:  Review of Systems  Constitutional:  Negative for activity change, chills and fever.  Gastrointestinal:  Negative for abdominal distention, abdominal pain, diarrhea, nausea and vomiting.  Genitourinary:  Positive for decreased urine volume and flank pain. Negative for difficulty urinating, dysuria, frequency and urgency.    Health Maintenance  Topic Date Due   Pneumonia Vaccine 49+ Years old (2 - PCV) 10/23/2015   COVID-19 Vaccine (3 - Moderna risk series) 12/14/2019   DTaP/Tdap/Td (4 - Td or Tdap) 05/09/2020   Zoster Vaccines- Shingrix (2 of 2) 06/19/2021   INFLUENZA VACCINE  05/15/2022   Medicare Annual Wellness (AWV)  10/26/2022   DEXA SCAN  Completed   HPV VACCINES  Aged Out    Physical Exam: Vitals:   10/01/22 1440  BP: 128/80  Pulse: 79  Resp: 18  Temp: (!) 97.5 F (36.4 C)  SpO2: 97%  Weight: 126 lb (57.2 kg)  Height: _0  (1.499 m)   Body mass index is 25.45 kg/m. Physical Exam Constitutional:      Appearance: Normal appearance.  HENT:     Head:  Normocephalic and atraumatic.     Nose: Nose normal.     Mouth/Throat:     Mouth: Mucous membranes are moist.  Eyes:     Conjunctiva/sclera: Conjunctivae normal.  Cardiovascular:     Rate and Rhythm: Normal rate and regular rhythm.  Pulmonary:     Effort: Pulmonary effort is normal.     Breath sounds: Normal breath sounds.  Abdominal:     General: Bowel sounds are normal.     Palpations: Abdomen is soft.  Musculoskeletal:        General: Normal range of motion.  Cervical back: Normal range of motion.  Skin:    General: Skin is warm and dry.  Neurological:     General: No focal deficit present.     Mental Status: She is alert and oriented to person, place, and time.  Psychiatric:        Mood and Affect: Mood normal.        Behavior: Behavior normal.        Thought Content: Thought content normal.        Judgment: Judgment normal.     Labs reviewed: Basic Metabolic Panel: Recent Labs    10/17/21 0710 01/29/22 1631 05/01/22 0730  NA 131* 137 137  K 4.8 4.7 4.2  CL 96* 99 99  CO2 _0 GLUCOSE 99 100* 89  BUN _1 CREATININE 1.14* 0.99 1.06*  CALCIUM 9.5 9.6 9.8   Liver Function Tests: Recent Labs    10/17/21 0710 01/29/22 1631  AST 28 36  ALT 12 14  ALKPHOS  --  65  BILITOT 0.9 1.6*  PROT 7.0 7.5  ALBUMIN  --  3.9   No results for input(s): "LIPASE", "AMYLASE" in the last 8760 hours. No results for input(s): "AMMONIA" in the last 8760 hours. CBC: Recent Labs    10/17/21 0710 01/29/22 1631  WBC 4.6 4.9  NEUTROABS 3,206 3.6  HGB 12.1 12.4  HCT 37.2 39.7  MCV 97.9 103.4*  PLT 189 178   Lipid Panel: No results for input(s): "CHOL", "HDL", "LDLCALC", "TRIG", "CHOLHDL", "LDLDIRECT" in the last 8760 hours. No results found for: "HGBA1C"  Procedures since last visit: No results found.  Assessment/Plan  1. Essential hypertension -  BP is stable, continue Coreg  2. Urinary problem - POC Urinalysis Dipstick  showed negative nitrate,  leukocyte, glucose, blood and ketones. -  not indicative of UTI  3. Hyponatremia Lab Results  Component Value Date   NA 137 05/01/2022   K 4.2 05/01/2022   CO2 27 05/01/2022   GLUCOSE 89 05/01/2022   BUN 17 05/01/2022   CREATININE 1.06 (H) 05/01/2022   CALCIUM 9.8 05/01/2022   EGFR 51 (L) 05/01/2022   GFRNONAA 56 (L) 01/29/2022   -  stable     Labs/tests ordered:   POC urine dipstick  Next appt:  11/14/2022

## 2022-10-01 NOTE — Patient Instructions (Signed)
Hyponatremia Hyponatremia is when the amount of salt (sodium) in your blood is too low. When salt levels are low, your body cells may take in extra water. This can cause swelling. The swelling often affects the brain. What are the causes? Certain medical problems or conditions. Vomiting a lot. Having watery poop (diarrhea) often. Sweating too much. Taking certain medicines or using illegal drugs. Fluids given through an IV tube. What increases the risk? Having heart, kidney, or liver failure. Having a medical condition that causes you to have watery poop a lot. Doing very hard exercises. Taking medicines that affect the amount of salt that is in your blood. What are the signs or symptoms? Symptoms of this condition include: Headache. Feeling like you may vomit (nausea). Vomiting. Being very tired. Muscle weakness and cramps. Not wanting to eat as much as normal. Feeling weak or dizzy. Very bad symptoms of this condition include: Confusion. Feeling restless. Having a fast heart rate. Fainting. Seizures. Coma. How is this treated? Treatment for this condition depends on the cause. Treatment may include: Getting fluids through an IV tube that is put into one of your veins. Taking medicines to fix the salt levels in your blood. If medicines are causing the problem, your medicines will need to be changed. Limiting how much water or fluid you take in, in some cases. Monitoring in the hospital to watch your symptoms. Follow these instructions at home:  Take over-the-counter and prescription medicines only as told by your doctor. Many medicines can make this condition worse. Talk with your doctor about any medicines that you are taking. Do not drink alcohol. Keep all follow-up visits. Contact a doctor if: You feel more like you may vomit. You feel more tired. Your headache gets worse. You feel more confused. You feel weaker. Your symptoms go away and then they come back. Get  help right away if: You have a seizure. You faint. You keep having watery poop. You keep vomiting. Summary Hyponatremia is when the amount of salt in your blood is too low. When salt levels are low, you can have swelling throughout the body. The swelling mostly affects the brain. Treatment depends on the cause. Treatment may include IV fluids and changing medicines. This information is not intended to replace advice given to you by your health care provider. Make sure you discuss any questions you have with your health care provider. Document Revised: 04/11/2021 Document Reviewed: 04/11/2021 Elsevier Patient Education  [place].

## 2022-11-06 ENCOUNTER — Other Ambulatory Visit: Payer: Self-pay | Admitting: Internal Medicine

## 2022-11-07 ENCOUNTER — Encounter: Payer: Medicare Other | Admitting: Family Medicine

## 2022-11-08 ENCOUNTER — Encounter: Payer: Medicare Other | Admitting: Nurse Practitioner

## 2022-11-14 ENCOUNTER — Encounter: Payer: Self-pay | Admitting: Family Medicine

## 2022-11-14 ENCOUNTER — Non-Acute Institutional Stay (INDEPENDENT_AMBULATORY_CARE_PROVIDER_SITE_OTHER): Payer: Medicare Other | Admitting: Family Medicine

## 2022-11-14 ENCOUNTER — Ambulatory Visit: Payer: Medicare Other | Admitting: Family Medicine

## 2022-11-14 VITALS — BP 118/70 | HR 81 | Temp 97.5°F | Ht 59.0 in | Wt 122.6 lb

## 2022-11-14 DIAGNOSIS — I5032 Chronic diastolic (congestive) heart failure: Secondary | ICD-10-CM | POA: Diagnosis not present

## 2022-11-14 DIAGNOSIS — I482 Chronic atrial fibrillation, unspecified: Secondary | ICD-10-CM

## 2022-11-14 DIAGNOSIS — N1832 Chronic kidney disease, stage 3b: Secondary | ICD-10-CM

## 2022-11-14 NOTE — Progress Notes (Signed)
Provider:  Alain Honey, MD  Careteam: Patient Care Team: Mast, Man X, NP as PCP - General (Internal Medicine) Megan Salon, MD (Gynecology) Eston Esters, MD (Inactive) (Hematology and Oncology) Laurence Spates, MD (Inactive) (Gastroenterology) Mast, Man X, NP as Nurse Practitioner (Internal Medicine)  PLACE OF SERVICE:  [place]  Advanced Directive information    Allergies  Allergen Reactions   Aspirin Rash    Chief Complaint  Patient presents with   Medical Management of Chronic Issues    Patient presents for a follow-up appointment     HPI: Patient is a 89 y.o. female nice lady here for follow-up appointment.  She was seen little over a month ago for possible UTI.  That workup was negative.  She does not drink much water because she was told with low sodium that she had once that it was could be delusional and she should limit her water intake.  I think that probably works against her dysuria the She also has a history of congestive failure and takes Lasix Monday Tuesday Thursday and Friday.  She feels like there may be some worse symptoms on days when she does not take the Lasix and she wonders if she could take Lasix on a different schedule.  We mutually decided to try Monday Wednesday and Friday and then take additional dose on the weekend depending on daily weights or symptoms such as shortness of breath She has a follow-up appointment next week at her cardiology office and if there is disagreement with this regimen of Lasix expect them to change things at that time.  Today I plan to measure a BMP as well as BNP.  Cardiology will have access to that next week Generally though she denies any shortness of breath.  She does sleep with her head elevated but that is related to GERD rather than orthopnea  Review of Systems:  Review of Systems  Constitutional: Negative.   HENT: Negative.    Respiratory: Negative.    Cardiovascular: Negative.  Negative for orthopnea  and PND.  Genitourinary:  Positive for dysuria.  Neurological: Negative.   Psychiatric/Behavioral: Negative.    All other systems reviewed and are negative.   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   Right lower lobectomy   Diverticulitis 1999   Femur fracture ([place]) 03/2013   left, non weight bearing for 2 1/2 weeks   GERD (gastroesophageal reflux disease)    Hemorrhoids 1991   HTN (hypertension)    Lymphedema of upper extremity    right arm   Osteoporosis    PONV (postoperative nausea and vomiting)    Urinary, incontinence, stress female    Vitamin D deficiency disease    Past Surgical History:  Procedure Laterality Date   APPENDECTOMY     BREAST BIOPSY  05/04/1987   Right Dr Annamaria Boots   BREAST IMPLANT REMOVAL Right 06/05/2010   BUNIONECTOMY  10/15/1986   carcinoid Right    Right lower lobectomy 2017   COLONOSCOPY     COLONOSCOPY W/ POLYPECTOMY     EYE SURGERY  5/14 ; 6/14   Cataract Surgery   JOINT REPLACEMENT     2002 left; 2009 right Hip, rt knee 2012   KNEE ARTHROPLASTY Right 10/15/2010   [name]  [facility]   LOBECTOMY Right 01/05/2016   Procedure: RIGHT LOWER LOBE LOBECTOMY;  Surgeon: Melrose Nakayama, MD;  Location: [place] [facility];  Service: Thoracic;  Laterality: Right;   MASTECTOMY PARTIAL / LUMPECTOMY W/ AXILLARY LYMPHADENECTOMY  04/05/1987   Right - Dr Annamaria Boots   RESECTION OF MEDIASTINAL MASS N/A 01/05/2016   Procedure: RESECTION OF PLEURAL MASS, right;  Surgeon: Melrose Nakayama, MD;  Location: [facility];  Service: Thoracic;  Laterality: N/A;   TONSILLECTOMY     TOTAL HIP ARTHROPLASTY     TUBAL LIGATION Bilateral 10/15/1968   VAGINAL HYSTERECTOMY  10/16/1975   [facility]   VIDEO ASSISTED THORACOSCOPY (VATS)/WEDGE RESECTION Right 01/05/2016   Procedure: RIGHT VIDEO ASSISTED  THORACOSCOPY (VATS)/WEDGE RESECTION;  Surgeon: Melrose Nakayama, MD;  Location: [facility];  Service: Thoracic;  Laterality: Right;   Social History:   reports that she has never smoked. She has never used smokeless tobacco. She reports that she does not drink alcohol and does not use drugs.  Family History  Problem Relation Age of Onset   Colon cancer Mother    Colon cancer Father    Heart disease Father    Stomach cancer Neg Hx    Liver cancer Neg Hx    Esophageal cancer Neg Hx     Medications: Patient's Medications  New Prescriptions   No medications on file  Previous Medications   ACETAMINOPHEN (TYLENOL) 500 MG TABLET    Take 2 tablets (1,000 mg total) by mouth every 6 (six) hours as needed for mild pain.   APIXABAN (ELIQUIS) 2.5 MG TABS TABLET    TAKE ONE TABLET BY MOUTH TWICE DAILY   BISACODYL (DULCOLAX) 5 MG EC TABLET    Take 1 tablet (5 mg total) by mouth daily as needed for severe constipation.   CALCIUM CARBONATE-VITAMIN D 600-400 MG-UNIT TABLET    Take 1 tablet by mouth daily.   CARVEDILOL (COREG) 6.25 MG TABLET    TAKE ONE TABLET BY MOUTH TWICE DAILY AFTER MEALS   ERGOCALCIFEROL (VITAMIN D2) 10 MCG (400 UNIT) TABS    Take 400 Units by mouth daily.   LACTOSE FREE NUTRITION (BOOST) LIQD    Take 237 mLs by mouth 2 (two) times daily between meals.   MULTIPLE VITAMIN (MULTIVITAMIN WITH MINERALS) TABS    Take 1 tablet by mouth every evening.   OMEGA-3 FATTY ACIDS (FISH OIL) 1000 MG CAPS    Take 1 capsule by mouth 2 (two) times daily.   PANTOPRAZOLE (PROTONIX) 40 MG TABLET    Take 1 tablet (40 mg total) by mouth 2 (two) times daily.   POLYETHYLENE GLYCOL (MIRALAX / GLYCOLAX) 17 G PACKET    Take 17 g by mouth daily as needed for moderate constipation.   POTASSIUM CHLORIDE SA (KLOR-CON M) 20 MEQ TABLET    TAKE ONE TABLET BY MOUTH ONCE DAILY   ROSUVASTATIN (CRESTOR) 10 MG TABLET    TAKE ONE TABLET AT BEDTIME.   SIMETHICONE (GAS-X) 80 MG CHEWABLE TABLET    Chew 1 tablet (80 mg total)  by mouth every 6 (six) hours as needed for flatulence.   TORSEMIDE (DEMADEX) 20 MG TABLET    Take 20 mg by mouth 4 (four) times a week. Monday, Tuesday, Thursday, Friday  Modified Medications   No medications on file  Discontinued Medications   No medications on file    Physical Exam:  Vitals:   11/14/22 1421  BP: 118/70  Pulse: 81  Temp: (!) 97.5 F (36.4 C)  SpO2:  98%  Weight: 122 lb 9.6 oz (55.6 kg)  Height: 4\' 11"  (1.499 m)   Body mass index is 24.76 kg/m. Wt Readings from Last 3 Encounters:  11/14/22 122 lb 9.6 oz (55.6 kg)  10/01/22 126 lb (57.2 kg)  09/13/22 124 lb (56.2 kg)    Physical Exam Vitals and nursing note reviewed.  Constitutional:      Appearance: Normal appearance.  Cardiovascular:     Rate and Rhythm: Normal rate and regular rhythm.  Pulmonary:     Effort: Pulmonary effort is normal.     Breath sounds: Normal breath sounds.  Musculoskeletal:     Right lower leg: No edema.     Left lower leg: No edema.  Neurological:     General: No focal deficit present.     Mental Status: She is alert and oriented to person, place, and time.     Labs reviewed: Basic Metabolic Panel: Recent Labs    01/29/22 1631 05/01/22 0730  NA 137 137  K 4.7 4.2  CL 99 99  CO2 27 27  GLUCOSE 100* 89  BUN 16 17  CREATININE 0.99 1.06*  CALCIUM 9.6 9.8   Liver Function Tests: Recent Labs    01/29/22 1631  AST 36  ALT 14  ALKPHOS 65  BILITOT 1.6*  PROT 7.5  ALBUMIN 3.9   No results for input(s): "LIPASE", "AMYLASE" in the last 8760 hours. No results for input(s): "AMMONIA" in the last 8760 hours. CBC: Recent Labs    01/29/22 1631  WBC 4.9  NEUTROABS 3.6  HGB 12.4  HCT 39.7  MCV 103.4*  PLT 178   Lipid Panel: No results for input(s): "CHOL", "HDL", "LDLCALC", "TRIG", "CHOLHDL", "LDLDIRECT" in the last 8760 hours. TSH: No results for input(s): "TSH" in the last 8760 hours. A1C: No results found for: "HGBA1C"   Assessment/Plan  1. Chronic  diastolic congestive heart failure ([place]) Patient is asymptomatic at this time.  Blood pressure and edema are well-controlled.  Blood pressure today 118/70  2. Atrial fibrillation, chronic ([place]) Heart is regular.  Did not believe she is in A-fib today  3. Stage 3b chronic kidney disease ([place]) Will reassess kidney function with basic metabolic panel.  Also recommended to liberalize fluid intake a bit   Alain Honey, MD [place] [phone]

## 2022-11-14 NOTE — Patient Instructions (Signed)
Take Lasix M,W,F and only S,Sun if weight is more than 3 pounds the day before.

## 2022-11-15 ENCOUNTER — Other Ambulatory Visit: Payer: Medicare Other

## 2022-11-15 LAB — BASIC METABOLIC PANEL WITHOUT GFR
BUN: 16 mg/dL (ref 7–25)
CO2: 29 mmol/L (ref 20–32)
Calcium: 9.5 mg/dL (ref 8.6–10.4)
Chloride: 99 mmol/L (ref 98–110)
Creat: 0.87 mg/dL (ref 0.60–0.95)
Glucose, Bld: 88 mg/dL (ref 65–99)
Potassium: 4.7 mmol/L (ref 3.5–5.3)
Sodium: 137 mmol/L (ref 135–146)
eGFR: 65 mL/min/{1.73_m2}

## 2022-11-15 LAB — BRAIN NATRIURETIC PEPTIDE: Brain Natriuretic Peptide: 252 pg/mL — ABNORMAL HIGH (ref <100)

## 2022-11-15 NOTE — Progress Notes (Signed)
Cardiology Office Note:   Date:  11/16/2022  NAME:  [name]    MRN: [id number] DOB:  04-Jan-1934   PCP:  Mast, Man X, NP  Cardiologist:  None  Electrophysiologist:  None   Referring MD: Virgie Dad, MD   Chief Complaint  Patient presents with   Follow-up         History of Present Illness:   [name] is a 89 y.o. female with a hx of permanent A-fib, HFpEF, hypertension who presents for follow-up.  She reports shortness of breath symptoms are occurring in the morning.  Symptoms are ongoing for 4 to 6 months.  She reports her weight is going down.  She remains on diuretics most days.  She does take breaks.  She reports low energy.  She reports weakness and fatigue.  Recent lab work shows stable kidney function.  Normal serum creatinine.  Her BNP value was 252 which is within range.  She has no evidence of volume overload.  Her weight is going down.  I suspect she is dehydrated.  She is only drinking 2 glasses of water per day.  She is still taking lots of torsemide.  We discussed backing off and taking torsemide as needed.  Her pulse is 57.  We will reduce her beta-blocker dose.  She reports no chest pain.  Her shortness of breath actually improves as the day goes on.  She is suffering from constipation.  This could also be contributing.  Her blood pressure is well-controlled.  No bleeding on Eliquis.  CV examination unremarkable.  Problem List 1. Permanent Afib -CHADSVASC=4 2. Carcinoid lung tumor  3. Cirrhosis  -on CT A/P 2020 4. Hyponatremia -SIADH? 5. HTN 6. 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   Right lower lobectomy   Diverticulitis 1999   Femur fracture ([place]) 03/2013   left, non weight bearing for 2 1/2 weeks   GERD  (gastroesophageal reflux disease)    Hemorrhoids 1991   HTN (hypertension)    Lymphedema of upper extremity    right arm   Osteoporosis    PONV (postoperative nausea and vomiting)    Urinary, incontinence, stress female    Vitamin D deficiency disease     Past Surgical History: Past Surgical History:  Procedure Laterality Date   APPENDECTOMY     BREAST BIOPSY  05/04/1987   Right Dr Annamaria Boots   BREAST IMPLANT REMOVAL Right 06/05/2010   BUNIONECTOMY  10/15/1986   carcinoid Right    Right lower lobectomy 2017   COLONOSCOPY     COLONOSCOPY W/ POLYPECTOMY     EYE SURGERY  5/14 ; 6/14   Cataract Surgery   JOINT REPLACEMENT     2002 left; 2009 right Hip, rt knee 2012   KNEE ARTHROPLASTY Right 10/15/2010   [name]   LOBECTOMY Right 01/05/2016   Procedure: RIGHT LOWER LOBE LOBECTOMY;  Surgeon: Melrose Nakayama, MD;  Location: [facility];  Service: Thoracic;  Laterality: Right;   MASTECTOMY PARTIAL / LUMPECTOMY W/ AXILLARY LYMPHADENECTOMY  04/05/1987   Right - Dr Annamaria Boots   RESECTION OF MEDIASTINAL MASS N/A 01/05/2016   Procedure: RESECTION OF PLEURAL MASS, right;  Surgeon: Melrose Nakayama, MD;  [facility];  Service: Thoracic;  Laterality: N/A;   TONSILLECTOMY     TOTAL HIP ARTHROPLASTY     TUBAL LIGATION Bilateral 10/15/1968   VAGINAL HYSTERECTOMY  10/16/1975   [facility]ata   VIDEO ASSISTED THORACOSCOPY (VATS)/WEDGE RESECTION Right 01/05/2016   Procedure: RIGHT VIDEO ASSISTED THORACOSCOPY (VATS)/WEDGE RESECTION;  Surgeon: Melrose Nakayama, MD;  Location: [facility];  Service: Thoracic;  Laterality: Right;    Current Medications: Current Meds  Medication Sig   acetaminophen (TYLENOL) 500 MG tablet Take 2 tablets (1,000 mg total) by mouth every 6 (six) hours as needed for mild pain.   apixaban (ELIQUIS) 2.5 MG TABS tablet TAKE ONE TABLET BY MOUTH TWICE DAILY   bisacodyl (DULCOLAX) 5 MG EC tablet Take 1 tablet (5 mg total) by mouth daily as needed for severe constipation.    Calcium Carbonate-Vitamin D 600-400 MG-UNIT tablet Take 1 tablet by mouth daily.   Ergocalciferol (VITAMIN D2) 10 MCG (400 UNIT) TABS Take 400 Units by mouth daily.   lactose free nutrition (BOOST) LIQD Take 237 mLs by mouth 2 (two) times daily between meals.   Multiple Vitamin (MULTIVITAMIN WITH MINERALS) TABS Take 1 tablet by mouth every evening.   Multiple Vitamins-Minerals (PRESERVISION AREDS PO) Take by mouth.   Omega-3 Fatty Acids (FISH OIL) 1000 MG CAPS Take 1 capsule by mouth 2 (two) times daily.   Omeprazole Magnesium (PRILOSEC OTC PO) Take by mouth.   polyethylene glycol (MIRALAX / GLYCOLAX) 17 g packet Take 17 g by mouth daily as needed for moderate constipation.   rosuvastatin (CRESTOR) 10 MG tablet TAKE ONE TABLET AT BEDTIME.   simethicone (GAS-X) 80 MG chewable tablet Chew 1 tablet (80 mg total) by mouth every 6 (six) hours as needed for flatulence.   [DISCONTINUED] carvedilol (COREG) 6.25 MG tablet TAKE ONE TABLET BY MOUTH TWICE DAILY AFTER MEALS   [DISCONTINUED] potassium chloride SA (KLOR-CON M) 20 MEQ tablet TAKE ONE TABLET BY MOUTH ONCE DAILY   [DISCONTINUED] torsemide (DEMADEX) 20 MG tablet Take 20 mg by mouth 4 (four) times a week. Monday, Tuesday, Thursday, Friday   Current Facility-Administered Medications for the 11/16/22 encounter (Office Visit) with Geralynn Rile, MD  Medication   0.9 %  sodium chloride infusion     Allergies:    Aspirin   Social History: Social History   Socioeconomic History   Marital status: Widowed    Spouse name: Not on file   Number of children: Not on file   Years of education: Not on file   Highest education level: Not on file  Occupational History   Not on file  Tobacco Use   Smoking status: Never   Smokeless tobacco: Never  Vaping Use   Vaping Use: Never used  Substance and Sexual Activity   Alcohol use: No    Alcohol/week: 0.0 standard drinks of alcohol   Drug use: No   Sexual activity: Not Currently    Partners:  Male    Birth control/protection: Surgical    Comment: hysterectomy  Other Topics Concern   Not on file  Social History Narrative   Not on file   Social Determinants of Health   Financial Resource Strain: Not on file  Food Insecurity: Not on file  Transportation Needs: Not on file  Physical Activity: Not on file  Stress: Not on file  Social Connections: Not on file     Family History: The patient's family history includes Colon cancer in her father and mother; Heart disease in her  father. There is no history of Stomach cancer, Liver cancer, or Esophageal cancer.  ROS:   All other ROS reviewed and negative. Pertinent positives noted in the HPI.     EKGs/Labs/Other Studies Reviewed:   The following studies were personally reviewed by me today:  TTE 06/02/2020  1. Left ventricular ejection fraction, by estimation, is 60 to 65%. The  left ventricle has normal function. The left ventricle has no regional  wall motion abnormalities. There is mild left ventricular hypertrophy.  Left ventricular diastolic parameters  are indeterminate.   2. Right ventricular systolic function is normal. The right ventricular  size is normal. There is normal pulmonary artery systolic pressure.   3. Left atrial size was moderately dilated.   4. Right atrial size was severely dilated.   5. The mitral valve is abnormal. Mild mitral valve regurgitation.   6. Tricuspid valve regurgitation is mild to moderate.   7. The aortic valve is abnormal. Aortic valve regurgitation is mild. Mild  aortic valve sclerosis is present, with no evidence of aortic valve  stenosis.   Recent Labs: 01/29/2022: ALT 14; Hemoglobin 12.4; Platelets 178 11/15/2022: Brain Natriuretic Peptide 252; BUN 16; Creat 0.87; Potassium 4.7; Sodium 137   Recent Lipid Panel    Component Value Date/Time   CHOL 119 01/05/2021 0715   TRIG 89 01/05/2021 0715   HDL 50 01/05/2021 0715   CHOLHDL 2.4 01/05/2021 0715   VLDL 10 04/09/2007 0915    LDLCALC 52 01/05/2021 0715   LDLDIRECT 107.8 04/09/2007 0915    Physical Exam:   VS:  BP 130/60   Pulse (!) 57   Ht 4\' 11"  (1.499 m)   Wt 121 lb 3.2 oz (55 kg)   LMP 10/16/1975 (Approximate)   SpO2 94%   BMI 24.48 kg/m    Wt Readings from Last 3 Encounters:  11/16/22 121 lb 3.2 oz (55 kg)  11/14/22 122 lb 9.6 oz (55.6 kg)  10/01/22 126 lb (57.2 kg)    General: Well nourished, well developed, in no acute distress Head: Atraumatic, normal size  Eyes: PEERLA, EOMI  Neck: Supple, no JVD Endocrine: No thryomegaly Cardiac: Normal S1, S2; irregular rhythm, no murmurs Lungs: Clear to auscultation bilaterally, no wheezing, rhonchi or rales  Abd: Soft, nontender, no hepatomegaly  Ext: No edema, pulses 2+ Musculoskeletal: No deformities, BUE and BLE strength normal and equal Skin: Warm and dry, no rashes   Neuro: Alert and oriented to person, place, time, and situation, CNII-XII grossly intact, no focal deficits  Psych: Normal mood and affect   ASSESSMENT:   [name] is a 89 y.o. female who presents for the following: 1. Chronic diastolic heart failure ([place])   2. SOB (shortness of breath)   3. Permanent atrial fibrillation ([place])   4. Acquired thrombophilia ([place])   5. Essential hypertension   6. Imbalance     PLAN:   1. Chronic diastolic heart failure ([facility]) -Euvolemic on examination.  Symptoms of shortness of breath are not related to congestive heart failure.  Her BNP value was within limits.  Clinically there is no worsening volume overload.  I believe dehydration could be contributing.  Reduce torsemide to 20 mg daily as needed.  I would like to recheck her echo.  This will further reassure everyone that congestive heart failure is not a big issue here.  2. SOB (shortness of breath) -Reduce torsemide to as needed.  Check CBC and TSH today.  Recent kidney function is normal.  Anemia or  thyroid dysfunction could be causing her symptoms.  I have also recommended home health  physical therapy.  She has not been that active.  She is also likely dehydrated.  Weight is down.  I have asked her to increase her water intake and we will Home health referral to physical therapy.  She also needs to work on eating better.  This is to be an issue per the daughter.  I think this could be contributing. -She is also slightly bradycardic.  Reduce Coreg to 3.25 mg twice daily.  She will keep a log of blood pressure as well as pulse.  If low we will pursue a home monitor.  3. Permanent atrial fibrillation ([place]) 4. Acquired thrombophilia ([place]) -Reduce Coreg to 3.25 mg twice daily.  Keep a log of heart rate.  On Eliquis 2.5 mg twice daily.  Appropriate for weight and age.  5. Essential hypertension -Keep a log of blood pressure and heart rate.  6. Imbalance -Referral to home health physical therapy.  Disposition: Return in about 4 months (around 03/17/2023).  Medication Adjustments/Labs and Tests Ordered: Current medicines are reviewed at length with the patient today.  Concerns regarding medicines are outlined above.  Orders Placed This Encounter  Procedures   TSH   CBC   Ambulatory referral to Physical Therapy   ECHOCARDIOGRAM COMPLETE   Meds ordered this encounter  Medications   torsemide (DEMADEX) 20 MG tablet    Sig: Take 1 tablet (20 mg total) by mouth as needed.    Dispense:  30 tablet    Refill:  3   potassium chloride SA (KLOR-CON M) 20 MEQ tablet    Sig: Take 1 tablet (20 mEq total) by mouth as needed (with Torsemide).    Dispense:  30 tablet    Refill:  3   carvedilol (COREG) 3.125 MG tablet    Sig: Take 1 tablet (3.125 mg total) by mouth 2 (two) times daily with a meal.    Dispense:  180 tablet    Refill:  3    Patient Instructions  Medication Instructions:  Decrease Carvedilol to 3.125 mg twice daily   START taking Torsemide and Potassium as needed.   *If you need a refill on your cardiac medications before your next appointment, please call your  pharmacy*   Lab Work: TSH, CBC today   If you have labs (blood work) drawn today and your tests are completely normal, you will receive your results only by: [facility] (if you have MyChart) OR A paper copy in the mail If you have any lab test that is abnormal or we need to change your treatment, we will call you to review the results.   Testing/Procedures: Echocardiogram - Your physician has requested that you have an echocardiogram. Echocardiography is a painless test that uses sound waves to create images of your heart. It provides your doctor with information about the size and shape of your heart and how well your heart's chambers and valves are working. This procedure takes approximately one hour. There are no restrictions for this procedure.     Follow-Up: At [facility], you and your health needs are our priority.  As part of our continuing mission to provide you with exceptional heart care, we have created designated Provider Care Teams.  These Care Teams include your primary Cardiologist (physician) and Advanced Practice Providers (APPs -  Physician Assistants and Nurse Practitioners) who all work together to provide you with the care you need, when you  need it.  We recommend signing up for the patient portal called "MyChart".  Sign up information is provided on this After Visit Summary.  MyChart is used to connect with patients for Virtual Visits (Telemedicine).  Patients are able to view lab/test results, encounter notes, upcoming appointments, etc.  Non-urgent messages can be sent to your provider as well.   To learn more about what you can do with MyChart, go to [email or web address].    Your next appointment:   4 month(s)  Provider:   Eleonore Chiquito, MD   Other Instructions Referral for Home Health- they will contact you.  Take BP/HR at home- let us know of any concerns.      Time Spent with Patient: I have spent a total of 35 minutes with patient  reviewing hospital notes, telemetry, EKGs, labs and examining the patient as well as establishing an assessment and plan that was discussed with the patient.  > 50% of time was spent in direct patient care.  Signed, Addison Naegeli. Audie Box, MD, [facility]  [address] [place], [place] [address] [phone]  11/16/2022 10:14 AM

## 2022-11-16 ENCOUNTER — Ambulatory Visit: Payer: Medicare Other | Admitting: Cardiovascular Disease

## 2022-11-16 ENCOUNTER — Encounter: Payer: Self-pay | Admitting: Cardiovascular Disease

## 2022-11-16 ENCOUNTER — Ambulatory Visit: Payer: Medicare Other | Attending: Cardiovascular Disease | Admitting: Cardiovascular Disease

## 2022-11-16 VITALS — BP 130/60 | HR 57 | Ht 59.0 in | Wt 121.2 lb

## 2022-11-16 DIAGNOSIS — I4821 Permanent atrial fibrillation: Secondary | ICD-10-CM | POA: Diagnosis not present

## 2022-11-16 DIAGNOSIS — R2689 Other abnormalities of gait and mobility: Secondary | ICD-10-CM

## 2022-11-16 DIAGNOSIS — R0602 Shortness of breath: Secondary | ICD-10-CM | POA: Diagnosis not present

## 2022-11-16 DIAGNOSIS — D6869 Other thrombophilia: Secondary | ICD-10-CM | POA: Diagnosis not present

## 2022-11-16 DIAGNOSIS — I5032 Chronic diastolic (congestive) heart failure: Secondary | ICD-10-CM

## 2022-11-16 DIAGNOSIS — I10 Essential (primary) hypertension: Secondary | ICD-10-CM

## 2022-11-16 LAB — CBC

## 2022-11-16 MED ORDER — POTASSIUM CHLORIDE CRYS ER 20 MEQ PO TBCR: 20.0000 meq | EXTENDED_RELEASE_TABLET | ORAL | 3 refills | Status: DC | PRN

## 2022-11-16 MED ORDER — CARVEDILOL 3.125 MG PO TABS: 3.1250 mg | ORAL_TABLET | Freq: Two times a day (BID) | ORAL | 3 refills | Status: DC

## 2022-11-16 MED ORDER — TORSEMIDE 20 MG PO TABS: 20.0000 mg | ORAL_TABLET | ORAL | 3 refills | Status: DC | PRN

## 2022-11-16 NOTE — Patient Instructions (Addendum)
Medication Instructions:  Decrease Carvedilol to 3.125 mg twice daily   START taking Torsemide and Potassium as needed.   *If you need a refill on your cardiac medications before your next appointment, please call your pharmacy*   Lab Work: TSH, CBC today   If you have labs (blood work) drawn today and your tests are completely normal, you will receive your results only by: [facility] (if you have MyChart) OR A paper copy in the mail If you have any lab test that is abnormal or we need to change your treatment, we will call you to review the results.   Testing/Procedures: Echocardiogram - Your physician has requested that you have an echocardiogram. Echocardiography is a painless test that uses sound waves to create images of your heart. It provides your doctor with information about the size and shape of your heart and how well your heart's chambers and valves are working. This procedure takes approximately one hour. There are no restrictions for this procedure.     Follow-Up: At [facility], you and your health needs are our priority.  As part of our continuing mission to provide you with exceptional heart care, we have created designated Provider Care Teams.  These Care Teams include your primary Cardiologist (physician) and Advanced Practice Providers (APPs -  Physician Assistants and Nurse Practitioners) who all work together to provide you with the care you need, when you need it.  We recommend signing up for the patient portal called "MyChart".  Sign up information is provided on this After Visit Summary.  MyChart is used to connect with patients for Virtual Visits (Telemedicine).  Patients are able to view lab/test results, encounter notes, upcoming appointments, etc.  Non-urgent messages can be sent to your provider as well.   To learn more about what you can do with MyChart, go to [email or web address].    Your next appointment:   4 month(s)  Provider:    Eleonore Chiquito, MD   Other Instructions Referral for Home Health- they will contact you.  Take BP/HR at home- let us know of any concerns.

## 2022-11-17 LAB — CBC
Hematocrit: 37 % (ref 34.0–46.6)
Hemoglobin: 11.9 g/dL (ref 11.1–15.9)
MCH: 31.3 pg (ref 26.6–33.0)
MCHC: 32.2 g/dL (ref 31.5–35.7)
MCV: 97 fL (ref 79–97)
Platelets: 159 10*3/uL (ref 150–450)
RBC: 3.8 x10E6/uL (ref 3.77–5.28)
RDW: 13.6 % (ref 11.7–15.4)
WBC: 4 10*3/uL (ref 3.4–10.8)

## 2022-11-17 LAB — TSH: TSH: 1.45 u[IU]/mL (ref 0.450–4.500)

## 2022-11-22 MED ORDER — AMLODIPINE BESYLATE 5 MG PO TABS: 5.0000 mg | ORAL_TABLET | Freq: Every day | ORAL | 3 refills | Status: DC

## 2022-11-26 MED ORDER — LOSARTAN POTASSIUM 25 MG PO TABS: 25.0000 mg | ORAL_TABLET | Freq: Every day | ORAL | 3 refills | Status: DC

## 2022-11-26 NOTE — Addendum Note (Signed)
Addended by: Ena Dawley on: 11/26/2022 04:35 PM   Modules accepted: Orders

## 2022-11-29 NOTE — Telephone Encounter (Signed)
Spoke with patient who was concerned her BP was too low. Recommended she take losartan in the evening, and to check BP in the AM and PM 2 hours after taking BP medications. She said she would try that. Also recommended she continue with other medications as prescribed and to contact our clinic if her SBP stays below 105.

## 2022-12-05 ENCOUNTER — Emergency Department (HOSPITAL_COMMUNITY): Payer: Medicare Other

## 2022-12-05 ENCOUNTER — Other Ambulatory Visit: Payer: Self-pay

## 2022-12-05 ENCOUNTER — Inpatient Hospital Stay (HOSPITAL_COMMUNITY)
Admission: EM | Admit: 2022-12-05 | Discharge: 2022-12-09 | DRG: 291 | Disposition: A | Discharge status: Home Health | Payer: Medicare Other | Source: Skilled Nursing Facility | Attending: Internal Medicine | Admitting: Internal Medicine

## 2022-12-05 ENCOUNTER — Telehealth: Payer: Self-pay | Admitting: Cardiovascular Disease

## 2022-12-05 DIAGNOSIS — Z1152 Encounter for screening for COVID-19: Secondary | ICD-10-CM

## 2022-12-05 DIAGNOSIS — I509 Heart failure, unspecified: Secondary | ICD-10-CM | POA: Diagnosis not present

## 2022-12-05 DIAGNOSIS — C7A090 Malignant carcinoid tumor of the bronchus and lung: Secondary | ICD-10-CM | POA: Diagnosis present

## 2022-12-05 DIAGNOSIS — R531 Weakness: Secondary | ICD-10-CM

## 2022-12-05 DIAGNOSIS — Z85118 Personal history of other malignant neoplasm of bronchus and lung: Secondary | ICD-10-CM

## 2022-12-05 DIAGNOSIS — Z96651 Presence of right artificial knee joint: Secondary | ICD-10-CM | POA: Diagnosis present

## 2022-12-05 DIAGNOSIS — I444 Left anterior fascicular block: Secondary | ICD-10-CM | POA: Diagnosis present

## 2022-12-05 DIAGNOSIS — R339 Retention of urine, unspecified: Secondary | ICD-10-CM | POA: Diagnosis present

## 2022-12-05 DIAGNOSIS — J432 Centrilobular emphysema: Secondary | ICD-10-CM | POA: Diagnosis present

## 2022-12-05 DIAGNOSIS — K219 Gastro-esophageal reflux disease without esophagitis: Secondary | ICD-10-CM | POA: Diagnosis present

## 2022-12-05 DIAGNOSIS — I10 Essential (primary) hypertension: Secondary | ICD-10-CM | POA: Diagnosis present

## 2022-12-05 DIAGNOSIS — N183 Chronic kidney disease, stage 3 unspecified: Secondary | ICD-10-CM | POA: Diagnosis present

## 2022-12-05 DIAGNOSIS — I482 Chronic atrial fibrillation, unspecified: Secondary | ICD-10-CM | POA: Diagnosis present

## 2022-12-05 DIAGNOSIS — R911 Solitary pulmonary nodule: Secondary | ICD-10-CM

## 2022-12-05 DIAGNOSIS — I5033 Acute on chronic diastolic (congestive) heart failure: Secondary | ICD-10-CM | POA: Diagnosis present

## 2022-12-05 DIAGNOSIS — I130 Hypertensive heart and chronic kidney disease with heart failure and stage 1 through stage 4 chronic kidney disease, or unspecified chronic kidney disease: Secondary | ICD-10-CM | POA: Diagnosis not present

## 2022-12-05 DIAGNOSIS — E559 Vitamin D deficiency, unspecified: Secondary | ICD-10-CM | POA: Diagnosis present

## 2022-12-05 DIAGNOSIS — N1831 Chronic kidney disease, stage 3a: Secondary | ICD-10-CM | POA: Diagnosis present

## 2022-12-05 DIAGNOSIS — Z9011 Acquired absence of right breast and nipple: Secondary | ICD-10-CM

## 2022-12-05 DIAGNOSIS — J90 Pleural effusion, not elsewhere classified: Secondary | ICD-10-CM

## 2022-12-05 DIAGNOSIS — Z800 Family history of malignant neoplasm of digestive organs: Secondary | ICD-10-CM

## 2022-12-05 DIAGNOSIS — Z902 Acquired absence of lung [part of]: Secondary | ICD-10-CM

## 2022-12-05 DIAGNOSIS — Z79899 Other long term (current) drug therapy: Secondary | ICD-10-CM

## 2022-12-05 DIAGNOSIS — Z8249 Family history of ischemic heart disease and other diseases of the circulatory system: Secondary | ICD-10-CM

## 2022-12-05 DIAGNOSIS — E876 Hypokalemia: Secondary | ICD-10-CM | POA: Diagnosis present

## 2022-12-05 DIAGNOSIS — Z853 Personal history of malignant neoplasm of breast: Secondary | ICD-10-CM

## 2022-12-05 DIAGNOSIS — I480 Paroxysmal atrial fibrillation: Secondary | ICD-10-CM | POA: Diagnosis present

## 2022-12-05 DIAGNOSIS — Z66 Do not resuscitate: Secondary | ICD-10-CM | POA: Diagnosis present

## 2022-12-05 DIAGNOSIS — M810 Age-related osteoporosis without current pathological fracture: Secondary | ICD-10-CM | POA: Diagnosis present

## 2022-12-05 DIAGNOSIS — R0602 Shortness of breath: Principal | ICD-10-CM

## 2022-12-05 DIAGNOSIS — I959 Hypotension, unspecified: Secondary | ICD-10-CM | POA: Diagnosis present

## 2022-12-05 DIAGNOSIS — Z7901 Long term (current) use of anticoagulants: Secondary | ICD-10-CM

## 2022-12-05 DIAGNOSIS — E785 Hyperlipidemia, unspecified: Secondary | ICD-10-CM | POA: Diagnosis present

## 2022-12-05 DIAGNOSIS — I700 Atherosclerosis of aorta: Secondary | ICD-10-CM | POA: Diagnosis present

## 2022-12-05 DIAGNOSIS — E871 Hypo-osmolality and hyponatremia: Secondary | ICD-10-CM | POA: Diagnosis present

## 2022-12-05 LAB — BASIC METABOLIC PANEL
Anion gap: 10 (ref 5–15)
BUN: 7 mg/dL — ABNORMAL LOW (ref 8–23)
CO2: 25 mmol/L (ref 22–32)
Calcium: 9.1 mg/dL (ref 8.9–10.3)
Chloride: 94 mmol/L — ABNORMAL LOW (ref 98–111)
Creatinine, Ser: 0.79 mg/dL (ref 0.44–1.00)
GFR, Estimated: >60 mL/min (ref >60)
Glucose, Bld: 103 mg/dL — ABNORMAL HIGH (ref 70–99)
Potassium: 4.3 mmol/L (ref 3.5–5.1)
Sodium: 129 mmol/L — ABNORMAL LOW (ref 135–145)

## 2022-12-05 LAB — URINALYSIS, ROUTINE W REFLEX MICROSCOPIC
Bilirubin Urine: NEGATIVE
Glucose, UA: NEGATIVE mg/dL
Hgb urine dipstick: NEGATIVE
Ketones, ur: 5 mg/dL — AB
Leukocytes,Ua: NEGATIVE
Nitrite: NEGATIVE
Protein, ur: NEGATIVE mg/dL
Specific Gravity, Urine: 1.004 — ABNORMAL LOW (ref 1.005–1.030)
pH: 7 (ref 5.0–8.0)

## 2022-12-05 LAB — CBC WITH DIFFERENTIAL/PLATELET
Abs Immature Granulocytes: 0.01 10*3/uL (ref 0.00–0.07)
Basophils Absolute: 0 10*3/uL (ref 0.0–0.1)
Basophils Relative: 0 %
Eosinophils Absolute: 0 10*3/uL (ref 0.0–0.5)
Eosinophils Relative: 0 %
HCT: 35.3 % — ABNORMAL LOW (ref 36.0–46.0)
Hemoglobin: 11.4 g/dL — ABNORMAL LOW (ref 12.0–15.0)
Immature Granulocytes: 0 %
Lymphocytes Relative: 8 %
Lymphs Abs: 0.4 10*3/uL — ABNORMAL LOW (ref 0.7–4.0)
MCH: 31.7 pg (ref 26.0–34.0)
MCHC: 32.3 g/dL (ref 30.0–36.0)
MCV: 98.1 fL (ref 80.0–100.0)
Monocytes Absolute: 0.4 10*3/uL (ref 0.1–1.0)
Monocytes Relative: 8 %
Neutro Abs: 3.6 10*3/uL (ref 1.7–7.7)
Neutrophils Relative %: 84 %
Platelets: 152 10*3/uL (ref 150–400)
RBC: 3.6 MIL/uL — ABNORMAL LOW (ref 3.87–5.11)
RDW: 14.7 % (ref 11.5–15.5)
WBC: 4.3 10*3/uL (ref 4.0–10.5)
nRBC: 0 % (ref 0.0–0.2)

## 2022-12-05 LAB — RESP PANEL BY RT-PCR (RSV, FLU A&B, COVID)  RVPGX2
Influenza A by PCR: NEGATIVE
Influenza B by PCR: NEGATIVE
Resp Syncytial Virus by PCR: NEGATIVE
SARS Coronavirus 2 by RT PCR: NEGATIVE

## 2022-12-05 LAB — MAGNESIUM: Magnesium: 2.1 mg/dL (ref 1.7–2.4)

## 2022-12-05 LAB — BRAIN NATRIURETIC PEPTIDE: B Natriuretic Peptide: 382.7 pg/mL — ABNORMAL HIGH (ref 0.0–100.0)

## 2022-12-05 LAB — TROPONIN I (HIGH SENSITIVITY)
Troponin I (High Sensitivity): 16 ng/L (ref <18)
Troponin I (High Sensitivity): 9 ng/L (ref <18)

## 2022-12-05 MED ORDER — BOOST PO LIQD
237.0000 mL | Freq: Two times a day (BID) | ORAL | Status: DC
  Administered 2022-12-06 – 2022-12-09 (×4): 237 mL via ORAL
  Filled 2022-12-05 (×8): qty 237

## 2022-12-05 MED ORDER — POTASSIUM CHLORIDE CRYS ER 20 MEQ PO TBCR: 20.0000 meq | EXTENDED_RELEASE_TABLET | ORAL | Status: DC | PRN

## 2022-12-05 MED ORDER — ACETAMINOPHEN 325 MG PO TABS: 650.0000 mg | ORAL_TABLET | ORAL | Status: DC | PRN

## 2022-12-05 MED ORDER — SIMETHICONE 80 MG PO CHEW: 80.0000 mg | CHEWABLE_TABLET | Freq: Four times a day (QID) | ORAL | Status: DC | PRN

## 2022-12-05 MED ORDER — ROSUVASTATIN CALCIUM 5 MG PO TABS
10.0000 mg | ORAL_TABLET | Freq: Every day | ORAL | Status: DC
  Administered 2022-12-05 – 2022-12-08 (×4): 10 mg via ORAL
  Filled 2022-12-05 (×4): qty 2

## 2022-12-05 MED ORDER — HYDRALAZINE HCL 20 MG/ML IJ SOLN
2.0000 mg | Freq: Four times a day (QID) | INTRAMUSCULAR | Status: DC | PRN
  Administered 2022-12-06: 2 mg via INTRAVENOUS
  Filled 2022-12-05: qty 1

## 2022-12-05 MED ORDER — SODIUM CHLORIDE 0.9 % IV SOLN: 250.0000 mL | INTRAVENOUS | Status: DC | PRN

## 2022-12-05 MED ORDER — PANTOPRAZOLE SODIUM 40 MG PO TBEC
40.0000 mg | DELAYED_RELEASE_TABLET | Freq: Every day | ORAL | Status: DC
  Administered 2022-12-06 – 2022-12-09 (×4): 40 mg via ORAL
  Filled 2022-12-05 (×5): qty 1

## 2022-12-05 MED ORDER — LOSARTAN POTASSIUM 50 MG PO TABS
50.0000 mg | ORAL_TABLET | Freq: Every day | ORAL | Status: DC
  Administered 2022-12-06: 50 mg via ORAL
  Filled 2022-12-05 (×2): qty 1

## 2022-12-05 MED ORDER — BISACODYL 5 MG PO TBEC: 5.0000 mg | DELAYED_RELEASE_TABLET | Freq: Every day | ORAL | Status: DC | PRN

## 2022-12-05 MED ORDER — ONDANSETRON HCL 4 MG/2ML IJ SOLN: 4.0000 mg | Freq: Four times a day (QID) | INTRAMUSCULAR | Status: DC | PRN

## 2022-12-05 MED ORDER — FUROSEMIDE 10 MG/ML IJ SOLN
20.0000 mg | Freq: Two times a day (BID) | INTRAMUSCULAR | Status: DC
  Administered 2022-12-05 – 2022-12-06 (×2): 20 mg via INTRAVENOUS
  Filled 2022-12-05 (×2): qty 2

## 2022-12-05 MED ORDER — IOHEXOL 350 MG/ML SOLN
75.0000 mL | Freq: Once | INTRAVENOUS | Status: AC | PRN
  Administered 2022-12-05: 75 mL via INTRAVENOUS

## 2022-12-05 MED ORDER — SODIUM CHLORIDE 0.9% FLUSH: 3.0000 mL | INTRAVENOUS | Status: DC | PRN

## 2022-12-05 MED ORDER — CARVEDILOL 3.125 MG PO TABS
3.1250 mg | ORAL_TABLET | Freq: Two times a day (BID) | ORAL | Status: DC
  Administered 2022-12-06 – 2022-12-09 (×7): 3.125 mg via ORAL
  Filled 2022-12-05 (×7): qty 1

## 2022-12-05 MED ORDER — SODIUM CHLORIDE 0.9% FLUSH
3.0000 mL | Freq: Two times a day (BID) | INTRAVENOUS | Status: DC
  Administered 2022-12-06 – 2022-12-09 (×7): 3 mL via INTRAVENOUS

## 2022-12-05 MED ORDER — ACETAMINOPHEN 500 MG PO TABS
1000.0000 mg | ORAL_TABLET | Freq: Four times a day (QID) | ORAL | Status: DC | PRN
  Administered 2022-12-06: 1000 mg via ORAL
  Filled 2022-12-05: qty 2

## 2022-12-05 MED ORDER — APIXABAN 2.5 MG PO TABS
2.5000 mg | ORAL_TABLET | Freq: Two times a day (BID) | ORAL | Status: DC
  Administered 2022-12-05 – 2022-12-09 (×8): 2.5 mg via ORAL
  Filled 2022-12-05 (×8): qty 1

## 2022-12-05 MED ORDER — POLYETHYLENE GLYCOL 3350 17 G PO PACK: 17.0000 g | PACK | Freq: Every day | ORAL | Status: DC | PRN

## 2022-12-05 NOTE — ED Notes (Signed)
Patient food tray ordered.   Pharmacy has just left the room.

## 2022-12-05 NOTE — ED Triage Notes (Signed)
Pt BIB [facility] from home d/t SOB that has been going on for a month. Per EMS, pt's cardiologist has been adjusting her meds over the past month d/t her condition. Pt sat at 98% on RA w EMS. A&O x4.

## 2022-12-05 NOTE — ED Notes (Signed)
Patient aaox4. No acute concerns at this time. Pt on the monitor and on 2L [place]. Purewick in place. Son at bedside.

## 2022-12-05 NOTE — ED Provider Notes (Signed)
  Physical Exam  BP (!) 166/85   Pulse 70   Temp 98.3 F (36.8 C) (Oral)   Resp 19   LMP 10/16/1975 (Approximate)   SpO2 95%   Physical Exam  Procedures  Procedures  ED Course / MDM    Medical Decision Making Amount and/or Complexity of Data Reviewed Labs: ordered. Radiology: ordered.  Risk Prescription drug management. Decision regarding hospitalization.   50F, SOB and weakness for one month, working outpatient with cards. Pending labs and imaging.  On my evaluation, the patient sats were dipping down to 92% on room air.  She was subsequent placed on 2 L O2 via nasal cannula.  CTA imaging was performed and revealed the following: IMPRESSION:  1. Study is negative for pulmonary embolism.  2. Enlarging RIGHT-sided pleural effusion is now small to moderate  layering along the posterior RIGHT chest. Trace LEFT-sided pleural  effusion.  3. Areas of ground-glass with mosaic attenuation seen in the chest.  Findings could be seen in the setting of pulmonary edema or small  airways disease. Would also include the possibility of viral  infection.  4. New RIGHT middle lobe nodule with mean diameter of 10 mm. Mild  spiculation suggested on coronal images. A small bronchogenic  neoplasm not excluded in addition to metastatic process. Consider  short interval follow-up, PET or biopsy for further assessment in  this patient with history of breast cancer and pulmonary carcinoid.  Multi disciplinary thoracic oncologic management is advised.  5. Additional 7 mm mean diameter nodule in the RIGHT lower lobe  warrants attention on follow-up.  6. Other smaller new nodules and stable smaller nodules are of  uncertain significance in a patient with history of carcinoid.    Aortic Atherosclerosis (ICD10-I70.0).    Discussed CT findings with the patient.  Patient would likely benefit from inpatient thoracentesis and pleural fluid studies in addition to potential biopsy of her new lung  nodule.  Hospitalist medicine consulted for admission, Dr. Roosevelt Locks accepting.    Regan Lemming, MD 12/05/22 [phone]

## 2022-12-05 NOTE — ED Notes (Signed)
ED TO INPATIENT HANDOFF REPORT  ED Nurse Name and Phone #: [phone] Graciella Freer Name/Age/Gender [name] 89 y.o. female Room/Bed: 039C/039C  Code Status   Code Status: DNR  Home/SNF/Other Home Patient oriented to: self, place, time, and situation Is this baseline? Yes   Triage Complete: Triage complete  Chief Complaint CHF (congestive heart failure) ([place]) [I50.9]  Triage Note Pt BIB [facility] from home d/t SOB that has been going on for a month. Per EMS, pt's cardiologist has been adjusting her meds over the past month d/t her condition. Pt sat at 98% on RA w EMS. A&O x4.    Allergies Allergies  Allergen Reactions   Aspirin Rash    Level of Care/Admitting Diagnosis ED Disposition     ED Disposition  Admit   Condition  --   Comment  Hospital Area: [facility] [[id number]]  Level of Care: Telemetry Medical [104]  May place patient in observation at [facility] or [facility] if equivalent level of care is available:: No  Covid Evaluation: Asymptomatic - no recent exposure (last 10 days) testing not required  Diagnosis: CHF (congestive heart failure) [facility]) [[id number]]  Admitting Physician: Lequita Halt [[id number]]  Attending Physician: Lequita Halt [[id number]]          B Medical/Surgery History Past Medical History:  Diagnosis Date   Arthritis 1996   Atrial fibrillation ([place])    on coumadin   Atrial tachycardia    Cancer ([place]) 1988   breast cancer, R mastectomy, chemo   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   Right lower lobectomy   Diverticulitis 1999   Femur fracture ([place]) 03/2013   left, non weight bearing for 2 1/2 weeks   GERD (gastroesophageal reflux disease)    Hemorrhoids 1991   HTN (hypertension)    Lymphedema of upper extremity    right arm   Osteoporosis    PONV (postoperative nausea and vomiting)    Urinary, incontinence, stress female    Vitamin D deficiency disease    Past Surgical History:  Procedure  Laterality Date   APPENDECTOMY     BREAST BIOPSY  05/04/1987   Right Dr Annamaria Boots   BREAST IMPLANT REMOVAL Right 06/05/2010   BUNIONECTOMY  10/15/1986   carcinoid Right    Right lower lobectomy 2017   COLONOSCOPY     COLONOSCOPY W/ POLYPECTOMY     EYE SURGERY  5/14 ; 6/14   Cataract Surgery   JOINT REPLACEMENT     2002 left; 2009 right Hip, rt knee 2012   KNEE ARTHROPLASTY Right 10/15/2010   [name]   LOBECTOMY Right 01/05/2016   Procedure: RIGHT LOWER LOBE LOBECTOMY;  Surgeon: Melrose Nakayama, MD;  Location: [facility];  Service: Thoracic;  Laterality: Right;   MASTECTOMY PARTIAL / LUMPECTOMY W/ AXILLARY LYMPHADENECTOMY  04/05/1987   Right - Dr Annamaria Boots   RESECTION OF MEDIASTINAL MASS N/A 01/05/2016   Procedure: RESECTION OF PLEURAL MASS, right;  Surgeon: Melrose Nakayama, MD;  Location: [facility];  Service: Thoracic;  Laterality: N/A;   TONSILLECTOMY     TOTAL HIP ARTHROPLASTY     TUBAL LIGATION Bilateral 10/15/1968   VAGINAL HYSTERECTOMY  10/16/1975   myomata   VIDEO ASSISTED THORACOSCOPY (VATS)/WEDGE RESECTION Right 01/05/2016   Procedure: RIGHT VIDEO ASSISTED THORACOSCOPY (VATS)/WEDGE RESECTION;  Surgeon: Melrose Nakayama, MD;  Location: [facility];  Service: Thoracic;  Laterality: Right;     A IV Location/Drains/Wounds Patient Lines/Drains/Airways Status  Active Line/Drains/Airways     Name Placement date Placement time Site Days   Peripheral IV 12/05/22 20 G 1.88" Anterior;Left Forearm 12/05/22  1529  Forearm  less than 1   Incision (Closed) 01/05/16 Chest Right 01/05/16  1406  -- 2526   Incision (Closed) 01/05/16 Chest Right 01/05/16  1406  -- 2526   Incision (Closed) 01/05/16 Chest Right 01/05/16  1408  -- 2526            Intake/Output Last 24 hours No intake or output data in the 24 hours ending 12/05/22 2103  Labs/Imaging Results for orders placed or performed during the hospital encounter of 12/05/22 (from the past 48 hour(s))  Resp panel by RT-PCR  (RSV, Flu A&B, Covid) Anterior Nasal Swab     Status: None   Collection Time: 12/05/22 12:58 PM   Specimen: Anterior Nasal Swab  Result Value Ref Range   SARS Coronavirus 2 by RT PCR NEGATIVE NEGATIVE   Influenza A by PCR NEGATIVE NEGATIVE   Influenza B by PCR NEGATIVE NEGATIVE    Comment: (NOTE) The Xpert Xpress SARS-CoV-2/FLU/RSV plus assay is intended as an aid in the diagnosis of influenza from Nasopharyngeal swab specimens and should not be used as a sole basis for treatment. Nasal washings and aspirates are unacceptable for Xpert Xpress SARS-CoV-2/FLU/RSV testing.  Fact Sheet for Patients: EntrepreneurPulse.com.au  Fact Sheet for Healthcare Providers: IncredibleEmployment.be  This test is not yet approved or cleared by the [place] FDA and has been authorized for detection and/or diagnosis of SARS-CoV-2 by FDA under an Emergency Use Authorization (EUA). This EUA will remain in effect (meaning this test can be used) for the duration of the COVID-19 declaration under Section 564(b)(1) of the Act, 21 U.S.C. section 360bbb-3(b)(1), unless the authorization is terminated or revoked.     Resp Syncytial Virus by PCR NEGATIVE NEGATIVE    Comment: (NOTE) Fact Sheet for Patients: EntrepreneurPulse.com.au  Fact Sheet for Healthcare Providers: IncredibleEmployment.be  This test is not yet approved or cleared by the [place] FDA and has been authorized for detection and/or diagnosis of SARS-CoV-2 by FDA under an Emergency Use Authorization (EUA). This EUA will remain in effect (meaning this test can be used) for the duration of the COVID-19 declaration under Section 564(b)(1) of the Act, 21 U.S.C. section 360bbb-3(b)(1), unless the authorization is terminated or revoked.  Performed at [facility], [place] [address], [place], [place] [address]   Brain natriuretic peptide     Status: Abnormal    Collection Time: 12/05/22 12:58 PM  Result Value Ref Range   B Natriuretic Peptide 382.7 (H) 0.0 - 100.0 pg/mL    Comment: Performed at [facility] [address], [place], [place] [address]  Basic metabolic panel     Status: Abnormal   Collection Time: 12/05/22  1:15 PM  Result Value Ref Range   Sodium 129 (L) 135 - 145 mmol/L   Potassium 4.3 3.5 - 5.1 mmol/L   Chloride 94 (L) 98 - 111 mmol/L   CO2 25 22 - 32 mmol/L   Glucose, Bld 103 (H) 70 - 99 mg/dL    Comment: Glucose reference range applies only to samples taken after fasting for at least 8 hours.   BUN 7 (L) 8 - 23 mg/dL   Creatinine, Ser 0.79 0.44 - 1.00 mg/dL   Calcium 9.1 8.9 - 10.3 mg/dL   GFR, Estimated >60 >60 mL/min    Comment: (NOTE) Calculated using the CKD-EPI Creatinine Equation (  2021)    Anion gap 10 5 - 15    Comment: Performed at [facility], [place] [address], [place], [place] [address]  CBC with Differential     Status: Abnormal   Collection Time: 12/05/22  1:15 PM  Result Value Ref Range   WBC 4.3 4.0 - 10.5 K/uL   RBC 3.60 (L) 3.87 - 5.11 MIL/uL   Hemoglobin 11.4 (L) 12.0 - 15.0 g/dL   HCT 35.3 (L) 36.0 - 46.0 %   MCV 98.1 80.0 - 100.0 fL   MCH 31.7 26.0 - 34.0 pg   MCHC 32.3 30.0 - 36.0 g/dL   RDW 14.7 11.5 - 15.5 %   Platelets 152 150 - 400 K/uL    Comment: REPEATED TO VERIFY   nRBC 0.0 0.0 - 0.2 %   Neutrophils Relative % 84 %   Neutro Abs 3.6 1.7 - 7.7 K/uL   Lymphocytes Relative 8 %   Lymphs Abs 0.4 (L) 0.7 - 4.0 K/uL   Monocytes Relative 8 %   Monocytes Absolute 0.4 0.1 - 1.0 K/uL   Eosinophils Relative 0 %   Eosinophils Absolute 0.0 0.0 - 0.5 K/uL   Basophils Relative 0 %   Basophils Absolute 0.0 0.0 - 0.1 K/uL   Immature Granulocytes 0 %   Abs Immature Granulocytes 0.01 0.00 - 0.07 K/uL    Comment: Performed at [facility], [place] [address] [place], [place] [address]  Magnesium     Status: None   Collection Time: 12/05/22  1:15 PM  Result Value Ref Range    Magnesium 2.1 1.7 - 2.4 mg/dL    Comment: Performed at [facility] [address], [place], [place] [address]  Troponin I (High Sensitivity)     Status: None   Collection Time: 12/05/22  1:15 PM  Result Value Ref Range   Troponin I (High Sensitivity) 16 <18 ng/L    Comment: (NOTE) Elevated high sensitivity troponin I (hsTnI) values and significant  changes across serial measurements may suggest ACS but many other  chronic and acute conditions are known to elevate hsTnI results.  Refer to the "Links" section for chest pain algorithms and additional  guidance. Performed at [facility], [place] [address], [place], [place] [address]   Urinalysis, Routine w reflex microscopic -Urine, Clean Catch     Status: Abnormal   Collection Time: 12/05/22  2:16 PM  Result Value Ref Range   Color, Urine STRAW (A) YELLOW   APPearance CLEAR CLEAR   Specific Gravity, Urine 1.004 (L) 1.005 - 1.030   pH 7.0 5.0 - 8.0   Glucose, UA NEGATIVE NEGATIVE mg/dL   Hgb urine dipstick NEGATIVE NEGATIVE   Bilirubin Urine NEGATIVE NEGATIVE   Ketones, ur 5 (A) NEGATIVE mg/dL   Protein, ur NEGATIVE NEGATIVE mg/dL   Nitrite NEGATIVE NEGATIVE   Leukocytes,Ua NEGATIVE NEGATIVE    Comment: Performed at [facility] [address], [place], [place] [address]  Troponin I (High Sensitivity)     Status: None   Collection Time: 12/05/22  2:35 PM  Result Value Ref Range   Troponin I (High Sensitivity) 9 <18 ng/L    Comment: (NOTE) Elevated high sensitivity troponin I (hsTnI) values and significant  changes across serial measurements may suggest ACS but many other  chronic and acute conditions are known to elevate hsTnI results.  Refer to the "Links" section for chest pain algorithms and additional  guidance. Performed at [facility], [place] [address], [place], [place] [address]    CT Angio Chest PE W/Cm &/Or Wo Cm  Result Date: 12/05/2022 CLINICAL DATA:  Suspected pulmonary embolism in an  89 year old female. * Tracking Code: BO * EXAM: CT ANGIOGRAPHY CHEST WITH CONTRAST TECHNIQUE: Multidetector CT imaging of the chest was performed using the standard protocol during bolus administration of intravenous contrast. Multiplanar CT image reconstructions and MIPs were obtained to evaluate the vascular anatomy. RADIATION DOSE REDUCTION: This exam was performed according to the departmental dose-optimization program which includes automated exposure control, adjustment of the mA and/or kV according to patient size and/or use of iterative reconstruction technique. CONTRAST:  12mL OMNIPAQUE IOHEXOL 350 MG/ML SOLN COMPARISON:  Comparison made with January 29, 2022. FINDINGS: Cardiovascular: Heart size is enlarged as on previous imaging. There is reflux of contrast into the hepatic veins though not as pronounced as on prior imaging. Three-vessel coronary artery disease. No pericardial effusion or sign of pericardial nodularity. Aortic atherosclerotic changes.  No aneurysmal dilation. Dense opacification of the pulmonary arterial bed. Maximal opacification of the main pulmonary artery at 719 Hounsfield units. Study is negative for pulmonary embolism. Mediastinum/Nodes: RIGHT axillary dissection changes post RIGHT mastectomy. No adenopathy. Lungs/Pleura: Areas of ground-glass with mosaic attenuation seen in the chest. Enlarging RIGHT-sided pleural effusion is now small to moderate layering along the posterior RIGHT chest. New middle lobe nodule (image 111/12) 7 x 6 mm. New LEFT lower lobe pulmonary nodule (image 107/12) 6-7 mm. Stable LEFT lower lobe pulmonary nodule (image 94/12) Stable RIGHT upper lobe pulmonary nodule (image 36/12) 5 mm. Area of nodularity in the RIGHT middle lobe (image 108/12) 14 x 5 mm appears more tubular and centered along bronchovascular structures, not definitely seen previously. Scattered additional areas of subtle nodularity are similar. Scarring following RIGHT lower lobectomy similar to  previous imaging. Airways are patent. No pneumothorax. Trace LEFT-sided pleural effusion. Upper Abdomen: Reflux of contrast into the a [place] veins. No acute upper abdominal process to the extent evaluated. Musculoskeletal: No acute musculoskeletal process. No destructive bone finding. Spinal degenerative changes. Review of the MIP images confirms the above findings. IMPRESSION: 1. Study is negative for pulmonary embolism. 2. Enlarging RIGHT-sided pleural effusion is now small to moderate layering along the posterior RIGHT chest. Trace LEFT-sided pleural effusion. 3. Areas of ground-glass with mosaic attenuation seen in the chest. Findings could be seen in the setting of pulmonary edema or small airways disease. Would also include the possibility of viral infection. 4. New RIGHT middle lobe nodule with mean diameter of 10 mm. Mild spiculation suggested on coronal images. A small bronchogenic neoplasm not excluded in addition to metastatic process. Consider short interval follow-up, PET or biopsy for further assessment in this patient with history of breast cancer and pulmonary carcinoid. Multi disciplinary thoracic oncologic management is advised. 5. Additional 7 mm mean diameter nodule in the RIGHT lower lobe warrants attention on follow-up. 6. Other smaller new nodules and stable smaller nodules are of uncertain significance in a patient with history of carcinoid. Aortic Atherosclerosis (ICD10-I70.0). Electronically Signed   By: Zetta Bills M.D.   On: 12/05/2022 17:30   DG Chest Port 1 View  Result Date: 12/05/2022 CLINICAL DATA:  89 year old female with shortness of breath. EXAM: PORTABLE CHEST - 1 VIEW COMPARISON:  Seventeen FINDINGS: The mediastinal contours are within normal limits. Unchanged cardiomegaly. Similar appearing blunting of right costophrenic angle. Mild bibasilar subsegmental atelectasis. No acute osseous abnormality. Surgical clips in right axilla IMPRESSION: 1. Trace right pleural  effusion. 2. Bibasilar subsegmental atelectasis. 3. Unchanged mild cardiomegaly. Electronically Signed   By: Ruthann Cancer M.D.   On: 12/05/2022 13:29    Pending Labs Unresulted Labs (From admission, onward)     Start     Ordered   12/06/22 0786  Basic metabolic panel  Daily,   R     Comments: As Scheduled for 5 days    12/05/22 1831            Vitals/Pain Today's Vitals   12/05/22 1831 12/05/22 1845 12/05/22 1937 12/05/22 2051  BP:  (!) 148/78 (!) 181/83   Pulse: 71 74 76   Resp: 17 (!) 27 (!) 30   Temp:    98 F (36.7 C)  TempSrc:    Oral  SpO2: 100% 100% 99%     Isolation Precautions Airborne and Contact precautions  Medications Medications  acetaminophen (TYLENOL) tablet 1,000 mg (has no administration in time range)  carvedilol (COREG) tablet 3.125 mg (has no administration in time range)  losartan (COZAAR) tablet 50 mg (has no administration in time range)  rosuvastatin (CRESTOR) tablet 10 mg (has no administration in time range)  furosemide (LASIX) injection 20 mg (20 mg Intravenous Given 12/05/22 1845)  bisacodyl (DULCOLAX) EC tablet 5 mg (has no administration in time range)  pantoprazole (PROTONIX) EC tablet 40 mg (has no administration in time range)  polyethylene glycol (MIRALAX / GLYCOLAX) packet 17 g (has no administration in time range)  simethicone (MYLICON) chewable tablet 80 mg (has no administration in time range)  apixaban (ELIQUIS) tablet 2.5 mg (has no administration in time range)  lactose free nutrition (Boost) liquid 237 mL (has no administration in time range)  sodium chloride flush (NS) 0.9 % injection 3 mL (has no administration in time range)  sodium chloride flush (NS) 0.9 % injection 3 mL (has no administration in time range)  0.9 %  sodium chloride infusion (has no administration in time range)  ondansetron (ZOFRAN) injection 4 mg (has no administration in time range)  hydrALAZINE (APRESOLINE) injection 2 mg (has no administration in  time range)  iohexol (OMNIPAQUE) 350 MG/ML injection 75 mL (75 mLs Intravenous Contrast Given 12/05/22 1652)    Mobility walks with device     Focused Assessments Cardiac Assessment Handoff:    Lab Results  Component Value Date   CKTOTAL 53 06/01/2020   No results found for: "DDIMER" Does the Patient currently have chest pain? No   , Pulmonary Assessment Handoff:  Lung sounds: Bilateral Breath Sounds: Clear O2 Device: Nasal Cannula O2 Flow Rate (L/min): 2 L/min    R Recommendations: See Admitting Provider Note  Report given to:   Additional Notes: ambulates with walker. Son at bedside.

## 2022-12-05 NOTE — H&P (Addendum)
History and Physical    [name] LPF:[id number] DOB: Sep 05, 1934 DOA: 12/05/2022  PCP: [facility] (Confirm with patient/family/NH records and if not entered, this has to be entered at [facility] point of entry) Patient coming from: Home  I have personally briefly reviewed patient's old medical records in [place]  Chief Complaint: SOB  HPI: [name] is a 89 y.o. female with medical history significant of chronic HFpEF, PAF on Eliquis, HTN, HLD, RLL adenocarcinoma status post lobectomy, presented with worsening of shortness of breath.  Chronically, patient takes torsemide 20 mg Monday through Friday for chronic HFpEF.  Starting about 4-5 weeks ago, patient developed frequent nauseous vomiting, and " stomach bloating", and went to see cardiology about 3 weeks ago.  And her CHF/BP meds were adjusted as such, torsemide was discontinued, and amlodipine was discontinued, and patient was started on losartan and her Coreg was also cut down from 6.25 mg twice daily to 3.125 mg twice daily.  2 weeks ago, patient started to have increasing exertional dyspnea, and she also found her blood pressure significantly elevated with SBP in the 170-180s, losartan was increased from 25 mg daily to 50 mg daily about 7 days ago.  However her breathing symptoms continue to get worse, yesterday even walking from her bed to bathroom because significant shortness of breath.  Denies any chest pain no cough no fever chills no diarrhea.  And her weight has been stable so she did not take any torsemide over the last 2 weeks.  And she wears a pair of compression stocking, which makes it hard for her to measure peripheral edema.  ED Course: Febrile, blood pressure significant elevated 180/99, O2 saturation 94% on room air.  CT angiogram negative for PE but signs of mild pulmonary edema, and new 1.0 cm spiral pulm nodule in the right middle lobe   Review of Systems: As per HPI otherwise 14 point review of  systems negative.    Past Medical History:  Diagnosis Date   Arthritis 1996   Atrial fibrillation ([place])    on coumadin   Atrial tachycardia    Cancer ([place]) 1988   breast cancer, R mastectomy, chemo   Carcinoid bronchial adenoma of right lung ([place]) 01/05/2016   Right lower lobectomy   Diverticulitis 1999   Femur fracture ([place]) 03/2013   left, non weight bearing for 2 1/2 weeks   GERD (gastroesophageal reflux disease)    Hemorrhoids 1991   HTN (hypertension)    Lymphedema of upper extremity    right arm   Osteoporosis    PONV (postoperative nausea and vomiting)    Urinary, incontinence, stress female    Vitamin D deficiency disease     Past Surgical History:  Procedure Laterality Date   APPENDECTOMY     BREAST BIOPSY  05/04/1987   Right Dr Annamaria Boots   BREAST IMPLANT REMOVAL Right 06/05/2010   BUNIONECTOMY  10/15/1986   carcinoid Right    Right lower lobectomy 2017   COLONOSCOPY     COLONOSCOPY W/ POLYPECTOMY     EYE SURGERY  5/14 ; 6/14   Cataract Surgery   JOINT REPLACEMENT     2002 left; 2009 right Hip, rt knee 2012   KNEE ARTHROPLASTY Right 10/15/2010   [name]   LOBECTOMY Right 01/05/2016   Procedure: RIGHT LOWER LOBE LOBECTOMY;  Surgeon: Melrose Nakayama, MD;  Location: [facility];  Service: Thoracic;  Laterality: Right;   MASTECTOMY PARTIAL / LUMPECTOMY W/ AXILLARY  LYMPHADENECTOMY  04/05/1987   Right - Dr Annamaria Boots   RESECTION OF MEDIASTINAL MASS N/A 01/05/2016   Procedure: RESECTION OF PLEURAL MASS, right;  Surgeon: Melrose Nakayama, MD;  Location: [facility];  Service: Thoracic;  Laterality: N/A;   TONSILLECTOMY     TOTAL HIP ARTHROPLASTY     TUBAL LIGATION Bilateral 10/15/1968   VAGINAL HYSTERECTOMY  10/16/1975   myomata   VIDEO ASSISTED THORACOSCOPY (VATS)/WEDGE RESECTION Right 01/05/2016   Procedure: RIGHT VIDEO ASSISTED THORACOSCOPY (VATS)/WEDGE RESECTION;  Surgeon: Melrose Nakayama, MD;  Location: [facility];  Service: Thoracic;  Laterality: Right;      reports that she has never smoked. She has never used smokeless tobacco. She reports that she does not drink alcohol and does not use drugs.  Allergies  Allergen Reactions   Aspirin Rash    Family History  Problem Relation Age of Onset   Colon cancer Mother    Colon cancer Father    Heart disease Father    Stomach cancer Neg Hx    Liver cancer Neg Hx    Esophageal cancer Neg Hx      Prior to Admission medications   Medication Sig Start Date End Date Taking? Authorizing Provider  acetaminophen (TYLENOL) 500 MG tablet Take 2 tablets (1,000 mg total) by mouth every 6 (six) hours as needed for mild pain. 01/17/16   Nani Skillern, PA-C  apixaban (ELIQUIS) 2.5 MG TABS tablet TAKE ONE TABLET BY MOUTH TWICE DAILY 02/13/22   O'Neal, Cassie Freer, MD  bisacodyl (DULCOLAX) 5 MG EC tablet Take 1 tablet (5 mg total) by mouth daily as needed for severe constipation. 06/08/20   Pokhrel, Corrie Mckusick, MD  Calcium Carbonate-Vitamin D 600-400 MG-UNIT tablet Take 1 tablet by mouth daily.    [provider]  carvedilol (COREG) 3.125 MG tablet Take 1 tablet (3.125 mg total) by mouth 2 (two) times daily with a meal. 11/16/22   O'Neal, Cassie Freer, MD  Ergocalciferol (VITAMIN D2) 10 MCG (400 UNIT) TABS Take 400 Units by mouth daily. 07/15/20   Mast, Man X, NP  lactose free nutrition (BOOST) LIQD Take 237 mLs by mouth 2 (two) times daily between meals.    [provider]  losartan (COZAAR) 25 MG tablet Take 1 tablet (25 mg total) by mouth daily. 11/26/22 11/21/23  Geralynn Rile, MD  Multiple Vitamin (MULTIVITAMIN WITH MINERALS) TABS Take 1 tablet by mouth every evening.    [provider]  Multiple Vitamins-Minerals (PRESERVISION AREDS PO) Take by mouth.    [provider]  Omega-3 Fatty Acids (FISH OIL) 1000 MG CAPS Take 1 capsule by mouth 2 (two) times daily.    [provider]  Omeprazole Magnesium (PRILOSEC OTC PO) Take by mouth.    [provider]   polyethylene glycol (MIRALAX / GLYCOLAX) 17 g packet Take 17 g by mouth daily as needed for moderate constipation. 07/15/20   Mast, Man X, NP  potassium chloride SA (KLOR-CON M) 20 MEQ tablet Take 1 tablet (20 mEq total) by mouth as needed (with Torsemide). 11/16/22   O'Neal, Cassie Freer, MD  Probiotic Product (ALIGN PO) Take by mouth.    [provider]  rosuvastatin (CRESTOR) 10 MG tablet TAKE ONE TABLET AT BEDTIME. 12/28/21   O'Neal, Cassie Freer, MD  simethicone (GAS-X) 80 MG chewable tablet Chew 1 tablet (80 mg total) by mouth every 6 (six) hours as needed for flatulence. 02/07/22   Wardell Honour, MD  torsemide (DEMADEX) 20 MG tablet  Take 1 tablet (20 mg total) by mouth as needed. 11/16/22   Geralynn Rile, MD    Physical Exam: Vitals:   12/05/22 1630 12/05/22 1711 12/05/22 1800 12/05/22 1815  BP: (!) 166/85  (!) 170/92 (!) 175/95  Pulse: 70  65 79  Resp: 19  19 (!) 26  Temp:  98.3 F (36.8 C)    TempSrc:  Oral    SpO2: 95%  100% 100%    Constitutional: NAD, calm, comfortable Vitals:   12/05/22 1630 12/05/22 1711 12/05/22 1800 12/05/22 1815  BP: (!) 166/85  (!) 170/92 (!) 175/95  Pulse: 70  65 79  Resp: 19  19 (!) 26  Temp:  98.3 F (36.8 C)    TempSrc:  Oral    SpO2: 95%  100% 100%   Eyes: PERRL, lids and conjunctivae normal ENMT: Mucous membranes are moist. Posterior pharynx clear of any exudate or lesions.Normal dentition.  Neck: normal, supple, no masses, no thyromegaly Respiratory: clear to auscultation bilaterally, no wheezing, bilateral fine crackles to the mid level, increasing breathing effort, talking in broken sentences. No accessory muscle use.  Cardiovascular: Regular rate and rhythm, no murmurs / rubs / gallops. 2+ extremity edema. 2+ pedal pulses. No carotid bruits.  Abdomen: no tenderness, no masses palpated. No hepatosplenomegaly. Bowel sounds positive.  Musculoskeletal: no clubbing / cyanosis. No joint deformity upper and lower  extremities. Good ROM, no contractures. Normal muscle tone.  Skin: no rashes, lesions, ulcers. No induration Neurologic: CN 2-12 grossly intact. Sensation intact, DTR normal. Strength 5/5 in all 4.  Psychiatric: Normal judgment and insight. Alert and oriented x 3. Normal mood.    Labs on Admission: I have personally reviewed following labs and imaging studies  CBC: Recent Labs  Lab 12/05/22 1315  WBC 4.3  NEUTROABS 3.6  HGB 11.4*  HCT 35.3*  MCV 98.1  PLT 465   Basic Metabolic Panel: Recent Labs  Lab 12/05/22 1315  NA 129*  K 4.3  CL 94*  CO2 25  GLUCOSE 103*  BUN 7*  CREATININE 0.79  CALCIUM 9.1  MG 2.1   GFR: CrCl cannot be calculated (Unknown ideal weight.). Liver Function Tests: No results for input(s): "AST", "ALT", "ALKPHOS", "BILITOT", "PROT", "ALBUMIN" in the last 168 hours. No results for input(s): "LIPASE", "AMYLASE" in the last 168 hours. No results for input(s): "AMMONIA" in the last 168 hours. Coagulation Profile: No results for input(s): "INR", "PROTIME" in the last 168 hours. Cardiac Enzymes: No results for input(s): "CKTOTAL", "CKMB", "CKMBINDEX", "TROPONINI" in the last 168 hours. BNP (last 3 results) No results for input(s): "PROBNP" in the last 8760 hours. HbA1C: No results for input(s): "HGBA1C" in the last 72 hours. CBG: No results for input(s): "GLUCAP" in the last 168 hours. Lipid Profile: No results for input(s): "CHOL", "HDL", "LDLCALC", "TRIG", "CHOLHDL", "LDLDIRECT" in the last 72 hours. Thyroid Function Tests: No results for input(s): "TSH", "T4TOTAL", "FREET4", "T3FREE", "THYROIDAB" in the last 72 hours. Anemia Panel: No results for input(s): "VITAMINB12", "FOLATE", "FERRITIN", "TIBC", "IRON", "RETICCTPCT" in the last 72 hours. Urine analysis:    Component Value Date/Time   COLORURINE STRAW (A) 12/05/2022 1416   APPEARANCEUR CLEAR 12/05/2022 1416   LABSPEC 1.004 (L) 12/05/2022 1416   PHURINE 7.0 12/05/2022 1416   GLUCOSEU  NEGATIVE 12/05/2022 1416   HGBUR NEGATIVE 12/05/2022 1416   BILIRUBINUR NEGATIVE 12/05/2022 1416   BILIRUBINUR neg 10/01/2022 1504   KETONESUR 5 (A) 12/05/2022 1416   PROTEINUR NEGATIVE 12/05/2022 1416   UROBILINOGEN negative (  A) 10/01/2022 1504   UROBILINOGEN 0.2 04/23/2015 1100   NITRITE NEGATIVE 12/05/2022 1416   LEUKOCYTESUR NEGATIVE 12/05/2022 1416    Radiological Exams on Admission: CT Angio Chest PE W/Cm &/Or Wo Cm  Result Date: 12/05/2022 CLINICAL DATA:  Suspected pulmonary embolism in an 89 year old female. * Tracking Code: BO * EXAM: CT ANGIOGRAPHY CHEST WITH CONTRAST TECHNIQUE: Multidetector CT imaging of the chest was performed using the standard protocol during bolus administration of intravenous contrast. Multiplanar CT image reconstructions and MIPs were obtained to evaluate the vascular anatomy. RADIATION DOSE REDUCTION: This exam was performed according to the departmental dose-optimization program which includes automated exposure control, adjustment of the mA and/or kV according to patient size and/or use of iterative reconstruction technique. CONTRAST:  9mL OMNIPAQUE IOHEXOL 350 MG/ML SOLN COMPARISON:  Comparison made with January 29, 2022. FINDINGS: Cardiovascular: Heart size is enlarged as on previous imaging. There is reflux of contrast into the hepatic veins though not as pronounced as on prior imaging. Three-vessel coronary artery disease. No pericardial effusion or sign of pericardial nodularity. Aortic atherosclerotic changes.  No aneurysmal dilation. Dense opacification of the pulmonary arterial bed. Maximal opacification of the main pulmonary artery at 719 Hounsfield units. Study is negative for pulmonary embolism. Mediastinum/Nodes: RIGHT axillary dissection changes post RIGHT mastectomy. No adenopathy. Lungs/Pleura: Areas of ground-glass with mosaic attenuation seen in the chest. Enlarging RIGHT-sided pleural effusion is now small to moderate layering along the posterior  RIGHT chest. New middle lobe nodule (image 111/12) 7 x 6 mm. New LEFT lower lobe pulmonary nodule (image 107/12) 6-7 mm. Stable LEFT lower lobe pulmonary nodule (image 94/12) Stable RIGHT upper lobe pulmonary nodule (image 36/12) 5 mm. Area of nodularity in the RIGHT middle lobe (image 108/12) 14 x 5 mm appears more tubular and centered along bronchovascular structures, not definitely seen previously. Scattered additional areas of subtle nodularity are similar. Scarring following RIGHT lower lobectomy similar to previous imaging. Airways are patent. No pneumothorax. Trace LEFT-sided pleural effusion. Upper Abdomen: Reflux of contrast into the a [place] veins. No acute upper abdominal process to the extent evaluated. Musculoskeletal: No acute musculoskeletal process. No destructive bone finding. Spinal degenerative changes. Review of the MIP images confirms the above findings. IMPRESSION: 1. Study is negative for pulmonary embolism. 2. Enlarging RIGHT-sided pleural effusion is now small to moderate layering along the posterior RIGHT chest. Trace LEFT-sided pleural effusion. 3. Areas of ground-glass with mosaic attenuation seen in the chest. Findings could be seen in the setting of pulmonary edema or small airways disease. Would also include the possibility of viral infection. 4. New RIGHT middle lobe nodule with mean diameter of 10 mm. Mild spiculation suggested on coronal images. A small bronchogenic neoplasm not excluded in addition to metastatic process. Consider short interval follow-up, PET or biopsy for further assessment in this patient with history of breast cancer and pulmonary carcinoid. Multi disciplinary thoracic oncologic management is advised. 5. Additional 7 mm mean diameter nodule in the RIGHT lower lobe warrants attention on follow-up. 6. Other smaller new nodules and stable smaller nodules are of uncertain significance in a patient with history of carcinoid. Aortic Atherosclerosis (ICD10-I70.0).  Electronically Signed   By: Zetta Bills M.D.   On: 12/05/2022 17:30   DG Chest Port 1 View  Result Date: 12/05/2022 CLINICAL DATA:  89 year old female with shortness of breath. EXAM: PORTABLE CHEST - 1 VIEW COMPARISON:  Seventeen FINDINGS: The mediastinal contours are within normal limits. Unchanged cardiomegaly. Similar appearing blunting of right costophrenic angle. Mild bibasilar  subsegmental atelectasis. No acute osseous abnormality. Surgical clips in right axilla IMPRESSION: 1. Trace right pleural effusion. 2. Bibasilar subsegmental atelectasis. 3. Unchanged mild cardiomegaly. Electronically Signed   By: Ruthann Cancer M.D.   On: 12/05/2022 13:29    EKG: Independently reviewed.  Rate controlled A-fib, no acute ST changes.  Assessment/Plan Principal Problem:   CHF (congestive heart failure) (HCC) Active Problems:   Atrial fibrillation, chronic (HCC)   Essential hypertension   Carcinoid bronchial adenoma of right lung (HCC)   Acute heart failure (HCC)   CKD (chronic kidney disease) stage 3, GFR 30-59 ml/min (HCC)  (please populate well all problems here in Problem List. (For example, if patient is on BP meds at home and you resume or decide to hold them, it is a problem that needs to be her. Same for CAD, COPD, HLD and so on)  Acute on chronic HFpEF decompensation -Secondary to uncontrolled hypertension -Significant pulmonary edema and bilateral pleural effusion R>L -Start Lasix 20 mg IV twice daily -Echocardiogram -Weight strict INO's. -May need to consider go back to as needed torsemide for blood pressure control 150/100 instead of weight change, discussed with patient and her son at bedside.  HTN, uncontrolled -As patient is on IV Lasix now, plan to continue home dosage of losartan and Coreg for now -Add as needed hydralazine for BP 150/100  Hyponatremia -Hypervolemic, secondary to CHF decompensation -Continue aggressive diuresis, and follow-up sodium level  RML lung nodule  with history of lung cancer and RLL lobectomy in 2017 -Given there is a significant pulmonary edema, plan to continue diuresis for now and repeat chest CT in 2 to 4 weeks  PAF -Rate controlled A-fib, less likely to contribute CHF decompensation -Continue Coreg 3.125 mg twice daily -Continue Eliquis  Deconditioning -PT evaluation  DVT prophylaxis: Eliquis Code Status: DNR Family Communication: Son at bedside Disposition Plan: Expect less than 2 midnight hospital stay Consults called: None Admission status: Tele obs   Lequita Halt MD [facility] Pager [phone]  12/05/2022, 6:33 PM

## 2022-12-05 NOTE — Telephone Encounter (Signed)
Noted. Will route to MD to make aware.  Thanks!

## 2022-12-05 NOTE — Telephone Encounter (Signed)
Patient's daughter calling to inform the patient was transported to the hospital today. She says the patient's BP was 150/90 this morning and she did not feel well. She says the patient lives at [facility] and asked to be transported. She says she is currently in [place] and her brother is going to the hospital to be with the patient. She says she does not need a call back unless there are questions.

## 2022-12-05 NOTE — ED Provider Notes (Signed)
[facility] Provider Note   CSN: [id number] Arrival date & time: 12/05/22  1229     History  Chief Complaint  Patient presents with   Shortness of Breath    [name] is a 89 y.o. female.  She has a history of A-fib CHF follows with Dr. Farris Has cardiology.  She has been having ongoing shortness of breath for over a month, multiple medication changes.  She said they took her off of her Coreg and put her on amlodipine which caused leg swelling.  She is now on losartan and just increased the dose but her blood pressure continues to remain high.  She is felt worse for the last week with dyspnea on any type of exertion and generally weak all over.  She went to the nurse today and could not even walk back to her room she was so fatigued.  She endorses a little bit of a headache and some abdominal bloating, no chest pain no diarrhea no urinary symptoms.  No fevers or chills.  No cough.  The history is provided by the patient.  Shortness of Breath Severity:  Moderate Onset quality:  Gradual Duration:  1 month Timing:  Intermittent Progression:  Worsening Relieved by:  Nothing Worsened by:  Activity Ineffective treatments:  Rest Associated symptoms: abdominal pain (bloating)   Associated symptoms: no chest pain, no cough, no fever, no hemoptysis, no sputum production and no vomiting        Home Medications Prior to Admission medications   Medication Sig Start Date End Date Taking? Authorizing Provider  acetaminophen (TYLENOL) 500 MG tablet Take 2 tablets (1,000 mg total) by mouth every 6 (six) hours as needed for mild pain. 01/17/16   Nani Skillern, PA-C  apixaban (ELIQUIS) 2.5 MG TABS tablet TAKE ONE TABLET BY MOUTH TWICE DAILY 02/13/22   O'Neal, Cassie Freer, MD  bisacodyl (DULCOLAX) 5 MG EC tablet Take 1 tablet (5 mg total) by mouth daily as needed for severe constipation. 06/08/20   Pokhrel, Corrie Mckusick, MD  Calcium Carbonate-Vitamin D  600-400 MG-UNIT tablet Take 1 tablet by mouth daily.    [provider]  carvedilol (COREG) 3.125 MG tablet Take 1 tablet (3.125 mg total) by mouth 2 (two) times daily with a meal. 11/16/22   O'Neal, Cassie Freer, MD  Ergocalciferol (VITAMIN D2) 10 MCG (400 UNIT) TABS Take 400 Units by mouth daily. 07/15/20   Mast, Man X, NP  lactose free nutrition (BOOST) LIQD Take 237 mLs by mouth 2 (two) times daily between meals.    [provider]  losartan (COZAAR) 25 MG tablet Take 1 tablet (25 mg total) by mouth daily. 11/26/22 11/21/23  Geralynn Rile, MD  Multiple Vitamin (MULTIVITAMIN WITH MINERALS) TABS Take 1 tablet by mouth every evening.    [provider]  Multiple Vitamins-Minerals (PRESERVISION AREDS PO) Take by mouth.    [provider]  Omega-3 Fatty Acids (FISH OIL) 1000 MG CAPS Take 1 capsule by mouth 2 (two) times daily.    [provider]  Omeprazole Magnesium (PRILOSEC OTC PO) Take by mouth.    [provider]  polyethylene glycol (MIRALAX / GLYCOLAX) 17 g packet Take 17 g by mouth daily as needed for moderate constipation. 07/15/20   Mast, Man X, NP  potassium chloride SA (KLOR-CON M) 20 MEQ tablet Take 1 tablet (20 mEq total) by mouth as needed (with Torsemide). 11/16/22   O'Neal, Cassie Freer, MD  Probiotic  Product (ALIGN PO) Take by mouth.    [provider]  rosuvastatin (CRESTOR) 10 MG tablet TAKE ONE TABLET AT BEDTIME. 12/28/21   O'Neal, Cassie Freer, MD  simethicone (GAS-X) 80 MG chewable tablet Chew 1 tablet (80 mg total) by mouth every 6 (six) hours as needed for flatulence. 02/07/22   Wardell Honour, MD  torsemide (DEMADEX) 20 MG tablet Take 1 tablet (20 mg total) by mouth as needed. 11/16/22   O'NealCassie Freer, MD      Allergies    Aspirin    Review of Systems   Review of Systems  Constitutional:  Positive for fatigue. Negative for fever.  Respiratory:  Positive for shortness of breath. Negative for cough,  hemoptysis and sputum production.   Cardiovascular:  Negative for chest pain.  Gastrointestinal:  Positive for abdominal distention and abdominal pain (bloating). Negative for vomiting.  Genitourinary:  Negative for dysuria.    Physical Exam Updated Vital Signs BP (!) 180/99   Pulse 70   Temp 98 F (36.7 C) (Oral)   Resp 14   LMP 10/16/1975 (Approximate)   SpO2 93%  Physical Exam Vitals and nursing note reviewed.  Constitutional:      General: She is not in acute distress.    Appearance: She is well-developed.  HENT:     Head: Normocephalic and atraumatic.  Eyes:     Conjunctiva/sclera: Conjunctivae normal.  Cardiovascular:     Rate and Rhythm: Normal rate and regular rhythm.     Heart sounds: No murmur heard. Pulmonary:     Effort: Pulmonary effort is normal. No respiratory distress.     Breath sounds: Normal breath sounds.  Abdominal:     Palpations: Abdomen is soft.     Tenderness: There is no abdominal tenderness.  Musculoskeletal:        General: No swelling.     Cervical back: Neck supple.     Right lower leg: No tenderness. No edema.     Left lower leg: No tenderness. No edema.  Skin:    General: Skin is warm and dry.     Capillary Refill: Capillary refill takes less than 2 seconds.  Neurological:     General: No focal deficit present.     Mental Status: She is alert.     ED Results / Procedures / Treatments   Labs (all labs ordered are listed, but only abnormal results are displayed) Labs Reviewed  BASIC METABOLIC PANEL - Abnormal; Notable for the following components:      Result Value   Sodium 129 (*)    Chloride 94 (*)    Glucose, Bld 103 (*)    BUN 7 (*)    All other components within normal limits  CBC WITH DIFFERENTIAL/PLATELET - Abnormal; Notable for the following components:   RBC 3.60 (*)    Hemoglobin 11.4 (*)    HCT 35.3 (*)    Lymphs Abs 0.4 (*)    All other components within normal limits  URINALYSIS, ROUTINE W REFLEX MICROSCOPIC -  Abnormal; Notable for the following components:   Color, Urine STRAW (*)    Specific Gravity, Urine 1.004 (*)    Ketones, ur 5 (*)    All other components within normal limits  BRAIN NATRIURETIC PEPTIDE - Abnormal; Notable for the following components:   B Natriuretic Peptide 382.7 (*)    All other components within normal limits  RESP PANEL BY RT-PCR (RSV, FLU A&B, COVID)  RVPGX2  MAGNESIUM  TROPONIN I (  HIGH SENSITIVITY)  TROPONIN I (HIGH SENSITIVITY)    EKG EKG Interpretation  Date/Time:  Wednesday December 05 2022 12:40:16 EST Ventricular Rate:  68 PR Interval:  252 QRS Duration: 87 QT Interval:  415 QTC Calculation: 442 R Axis:   -37 Text Interpretation: atrial fibrillation Left axis deviation Abnormal R-wave progression, early transition  No significant change since prior 4/23  Confirmed by Aletta Edouard [phone]) on 12/05/2022 1:01:30 PM  Radiology DG Chest Port 1 View  Result Date: 12/05/2022 CLINICAL DATA:  89 year old female with shortness of breath. EXAM: PORTABLE CHEST - 1 VIEW COMPARISON:  Seventeen FINDINGS: The mediastinal contours are within normal limits. Unchanged cardiomegaly. Similar appearing blunting of right costophrenic angle. Mild bibasilar subsegmental atelectasis. No acute osseous abnormality. Surgical clips in right axilla IMPRESSION: 1. Trace right pleural effusion. 2. Bibasilar subsegmental atelectasis. 3. Unchanged mild cardiomegaly. Electronically Signed   By: Ruthann Cancer M.D.   On: 12/05/2022 13:29    Procedures Procedures    Medications Ordered in ED Medications - No data to display  ED Course/ Medical Decision Making/ A&P                             Medical Decision Making Amount and/or Complexity of Data Reviewed Labs: ordered. Radiology: ordered.   This patient complains of shortness of breath weakness fatigue; this involves an extensive number of treatment Options and is a complaint that carries with it a high risk of  complications and morbidity. The differential includes CHF, overdiuresis, renal failure, metabolic derangement, infection, pneumonia, PE  I ordered, reviewed and interpreted labs, which included CBC with normal white count, hemoglobin low stable from priors, chemistries with low sodium normal renal function, troponins flat, magnesium normal, urinalysis without signs of infection, COVID and flu negative, BNP elevated similar to priors I ordered imaging studies which included chest x-ray and CT angio chest and I independently    visualized and interpreted imaging which showed trace effusion.  CT pending at time of signout Previous records obtained and reviewed in epic including recent cardiology notes  Cardiac monitoring reviewed, atrial fibrillation with controlled rate Social determinants considered, no significant barriers Critical Interventions: None  After the interventions stated above, I reevaluated the patient and found patient be satting well on room air no distress Admission and further testing considered, patient's care signed out to Dr. Armandina Gemma to follow-up on results of lab work and CT.  May need cardiology input.         Final Clinical Impression(s) / ED Diagnoses Final diagnoses:  SOB (shortness of breath)  General weakness    Rx / DC Orders ED Discharge Orders     None         Hayden Rasmussen, MD 12/05/22 [id number]

## 2022-12-06 ENCOUNTER — Observation Stay (HOSPITAL_COMMUNITY): Payer: Medicare Other

## 2022-12-06 ENCOUNTER — Encounter (HOSPITAL_COMMUNITY): Payer: Self-pay | Admitting: Internal Medicine

## 2022-12-06 DIAGNOSIS — Z800 Family history of malignant neoplasm of digestive organs: Secondary | ICD-10-CM | POA: Diagnosis not present

## 2022-12-06 DIAGNOSIS — I480 Paroxysmal atrial fibrillation: Secondary | ICD-10-CM | POA: Diagnosis present

## 2022-12-06 DIAGNOSIS — N1832 Chronic kidney disease, stage 3b: Secondary | ICD-10-CM | POA: Diagnosis not present

## 2022-12-06 DIAGNOSIS — Z9011 Acquired absence of right breast and nipple: Secondary | ICD-10-CM | POA: Diagnosis not present

## 2022-12-06 DIAGNOSIS — E785 Hyperlipidemia, unspecified: Secondary | ICD-10-CM | POA: Diagnosis present

## 2022-12-06 DIAGNOSIS — C7A090 Malignant carcinoid tumor of the bronchus and lung: Secondary | ICD-10-CM

## 2022-12-06 DIAGNOSIS — Z1152 Encounter for screening for COVID-19: Secondary | ICD-10-CM | POA: Diagnosis not present

## 2022-12-06 DIAGNOSIS — Z8249 Family history of ischemic heart disease and other diseases of the circulatory system: Secondary | ICD-10-CM | POA: Diagnosis not present

## 2022-12-06 DIAGNOSIS — J432 Centrilobular emphysema: Secondary | ICD-10-CM | POA: Diagnosis present

## 2022-12-06 DIAGNOSIS — Z79899 Other long term (current) drug therapy: Secondary | ICD-10-CM | POA: Diagnosis not present

## 2022-12-06 DIAGNOSIS — N1831 Chronic kidney disease, stage 3a: Secondary | ICD-10-CM | POA: Diagnosis present

## 2022-12-06 DIAGNOSIS — I482 Chronic atrial fibrillation, unspecified: Secondary | ICD-10-CM

## 2022-12-06 DIAGNOSIS — Z853 Personal history of malignant neoplasm of breast: Secondary | ICD-10-CM | POA: Diagnosis not present

## 2022-12-06 DIAGNOSIS — Z902 Acquired absence of lung [part of]: Secondary | ICD-10-CM | POA: Diagnosis not present

## 2022-12-06 DIAGNOSIS — E871 Hypo-osmolality and hyponatremia: Secondary | ICD-10-CM | POA: Diagnosis present

## 2022-12-06 DIAGNOSIS — I959 Hypotension, unspecified: Secondary | ICD-10-CM | POA: Diagnosis present

## 2022-12-06 DIAGNOSIS — I509 Heart failure, unspecified: Secondary | ICD-10-CM | POA: Diagnosis present

## 2022-12-06 DIAGNOSIS — I5033 Acute on chronic diastolic (congestive) heart failure: Secondary | ICD-10-CM | POA: Diagnosis present

## 2022-12-06 DIAGNOSIS — Z96651 Presence of right artificial knee joint: Secondary | ICD-10-CM | POA: Diagnosis present

## 2022-12-06 DIAGNOSIS — I5031 Acute diastolic (congestive) heart failure: Secondary | ICD-10-CM

## 2022-12-06 DIAGNOSIS — I130 Hypertensive heart and chronic kidney disease with heart failure and stage 1 through stage 4 chronic kidney disease, or unspecified chronic kidney disease: Secondary | ICD-10-CM | POA: Diagnosis present

## 2022-12-06 DIAGNOSIS — I10 Essential (primary) hypertension: Secondary | ICD-10-CM

## 2022-12-06 DIAGNOSIS — E876 Hypokalemia: Secondary | ICD-10-CM | POA: Diagnosis present

## 2022-12-06 DIAGNOSIS — Z85118 Personal history of other malignant neoplasm of bronchus and lung: Secondary | ICD-10-CM | POA: Diagnosis not present

## 2022-12-06 DIAGNOSIS — I444 Left anterior fascicular block: Secondary | ICD-10-CM | POA: Diagnosis present

## 2022-12-06 DIAGNOSIS — M810 Age-related osteoporosis without current pathological fracture: Secondary | ICD-10-CM | POA: Diagnosis present

## 2022-12-06 DIAGNOSIS — Z7901 Long term (current) use of anticoagulants: Secondary | ICD-10-CM | POA: Diagnosis not present

## 2022-12-06 DIAGNOSIS — I700 Atherosclerosis of aorta: Secondary | ICD-10-CM | POA: Diagnosis present

## 2022-12-06 DIAGNOSIS — Z66 Do not resuscitate: Secondary | ICD-10-CM | POA: Diagnosis present

## 2022-12-06 LAB — BASIC METABOLIC PANEL
Anion gap: 13 (ref 5–15)
BUN: 7 mg/dL — ABNORMAL LOW (ref 8–23)
CO2: 24 mmol/L (ref 22–32)
Calcium: 8.9 mg/dL (ref 8.9–10.3)
Chloride: 93 mmol/L — ABNORMAL LOW (ref 98–111)
Creatinine, Ser: 0.78 mg/dL (ref 0.44–1.00)
GFR, Estimated: >60 mL/min (ref >60)
Glucose, Bld: 105 mg/dL — ABNORMAL HIGH (ref 70–99)
Potassium: 3.8 mmol/L (ref 3.5–5.1)
Sodium: 130 mmol/L — ABNORMAL LOW (ref 135–145)

## 2022-12-06 LAB — ECHOCARDIOGRAM COMPLETE
AR max vel: 1.95 cm2
AV Area VTI: 1.94 cm2
AV Area mean vel: 1.79 cm2
AV Mean grad: 6.5 mmHg
AV Peak grad: 11.9 mmHg
Ao pk vel: 1.73 m/s
Height: 59.016 in
MV VTI: 2.32 cm2
S' Lateral: 2.5 cm
Weight: 1968.27 oz

## 2022-12-06 MED ORDER — SENNOSIDES-DOCUSATE SODIUM 8.6-50 MG PO TABS
2.0000 | ORAL_TABLET | Freq: Every day | ORAL | Status: DC
  Administered 2022-12-06 – 2022-12-08 (×3): 2 via ORAL
  Filled 2022-12-06 (×3): qty 2

## 2022-12-06 NOTE — Hospital Course (Addendum)
[name] was admitted to the hospital with the working diagnosis of decompensated heart failure.   89 yo female with the past medical history of heart failure, paroxysmal atrial fibrillation, lung cancer right lower lobe sp lobectomy and hypertension who presented with dyspnea. Her diuretic therapy was discontinued 3 weeks ago, one week later she developed dyspnea and uncontrolled hypertension. On her initial physical examination her blood pressure was 180/99, HR 70, RR 19 and 02 saturation 95%, lungs with bilateral rales with increased work of breathing with no wheezing, heart with S1 and S2 present and regular, abdomen with no distention and positive lower extremity edema.   Na 129, K 4,3 Cl 94, bicarbonate 25, glucose 103 bun 7 cr 0,79 BNP 382  High sensitive troponin 16 and 9  Wbc 4,3 hgb 11,4 plt 152  Sars covid 19 negative   Urine analysis SG 1,004, negative proteins and negative leukocytes.   Chest radiograph with cardiomegaly, bilateral hilar vascular congestion, with small right pleural effusion. CT chest with bilateral apical centrilobular emphysema, with bilateral ground glass opacities and small right pleural effusion.  Negative for pulmonary embolism.  Right middle lobe nodule 10 mm diameter (mild spiculation). 7 mm diameter nodule right lower lobe.   EKG 68 bpm, left axis, with left anterior fascicular block, atrial fibrillation rhythm with no significant ST segment or T wave changes.   02/23 volume status has improved, transition to oral regimen of diuretics. Monitor response before discharge.   02/24 Continue adjusting antihypertensive regimen. Possible discharge tomorrow.

## 2022-12-06 NOTE — Assessment & Plan Note (Addendum)
Echocardiogram with preserved LV systolic function EF 55 to 60%, mild LVH, RV systolic function with mild reduction. RVSP 55.2 mmHg. LA and RA with severe dilatation, moderate TR   Urine output is [id number]  ml  Systolic blood pressure A999333 to 147 mmHg.   Continue with empagliflozin and resume torsemide along with losartan. If blood pressure and renal function stable, plan to add spironolactone. Continue with losartan.

## 2022-12-06 NOTE — Assessment & Plan Note (Signed)
Patient will follow up as outpatient

## 2022-12-06 NOTE — Plan of Care (Signed)

## 2022-12-06 NOTE — Assessment & Plan Note (Addendum)
Patient had transitory hypotension during diuresis.  At the time of her discharge her blood pressure is stable with systolic at [id number] to [id number] mmHg.  Plan to continue carvedilol and losartan.   Dyslipidemia, continue with statin therapy.

## 2022-12-06 NOTE — Assessment & Plan Note (Signed)
Continue rate control with carvedilol and anticoagulation with apixaban.

## 2022-12-06 NOTE — Plan of Care (Signed)

## 2022-12-06 NOTE — Progress Notes (Signed)
Mobility Specialist - Progress Note   12/06/22 1400  Mobility  Activity Ambulated with assistance in hallway  Level of Assistance Contact guard assist, steadying assist  Assistive Device Front wheel walker  Distance Ambulated (ft) 280 ft  Activity Response Tolerated well  Mobility Referral Yes  $Mobility charge 1 Mobility    Pt received in bed agreeable to mobility. No complaints throughout. Left in bed w/ all needs met and call bell within reach.   [place] Specialist Please contact via SecureChat or Rehab office at [phone]

## 2022-12-06 NOTE — Progress Notes (Signed)
Echocardiogram 2D Echocardiogram has been performed.  [name] 12/06/2022, 12:26 PM

## 2022-12-06 NOTE — TOC Progression Note (Addendum)
Transition of Care [facility]) - Progression Note    Patient Details  Name: [name] MRN: [id number] Date of Birth: Aug 05, 1934  Transition of Care [facility]) CM/SW Contact  Zenon Mayo, RN Phone Number: 12/06/2022, 4:21 PM  Clinical Narrative:    From [facility]L, CHF , edema, still issues with voiding. Daughter at the bedside, NCM offered choice for [facility].  Daughter states she would want to do therapy on[facility], patient is in agreement.  NCM will fax orders to [facility] tomorrow. Daughter will transport patient back to [place] at [employer] and per Staff RN , MD said she needs one more day, so plan to dc on 2/24.         Expected Discharge Plan and Services                                               Social Determinants of Health (SDOH) Interventions SDOH Screenings   Food Insecurity: No Food Insecurity (12/05/2022)  Housing: Low Risk  (12/05/2022)  Transportation Needs: No Transportation Needs (12/05/2022)  Utilities: Not At Risk (12/05/2022)  Depression (PHQ2-9): Low Risk  (10/19/2021)  Tobacco Use: Low Risk  (12/06/2022)    Readmission Risk Interventions     No data to display

## 2022-12-06 NOTE — Progress Notes (Signed)
Bladder scan 200-364ml - pt ambulated to the bathroom.  Pt able to void 159ml.  Pt denies discomfort or the urge to void.  Will continue to monitor.

## 2022-12-06 NOTE — Assessment & Plan Note (Addendum)
Hyponatremia,   Renal function with serum cr at 0,92, K is 3,9 and serum bicarbonate at 28. Na 128 and Mg 2,0   Intermittent urinary retention.  Continue bladder scan monitoring.   Resume torsemide and follow up renal function and electrolytes in am.

## 2022-12-06 NOTE — Progress Notes (Signed)
Progress Note   Patient: [name] FYB:[id number] DOB: 1934-03-07 DOA: 12/05/2022     0 DOS: the patient was seen and examined on 12/06/2022   Brief hospital course: [name] was admitted to the hospital with the working diagnosis of decompensated heart failure.   89 yo female with the past medical history of heart failure, paroxysmal atrial fibrillation, lung cancer right lower lobe sp lobectomy and hypertension who presented with dyspnea. Her diuretic therapy was discontinued 3 weeks ago, one week later she developed dyspnea and uncontrolled hypertension. On her initial physical examination her blood pressure was 180/99, HR 70, RR 19 and 02 saturation 95%, lungs with bilateral rales with increased work of breathing with no wheezing, heart with S1 and S2 present and regular, abdomen with no distention and positive lower extremity edema.   Na 129, K 4,3 Cl 94, bicarbonate 25, glucose 103 bun 7 cr 0,79 BNP 382  High sensitive troponin 16 and 9  Wbc 4,3 hgb 11,4 plt 152  Sars covid 19 negative   Urine analysis SG 1,004, negative proteins and negative leukocytes.   Chest radiograph with cardiomegaly, bilateral hilar vascular congestion, with small right pleural effusion. CT chest with bilateral apical centrilobular emphysema, with bilateral ground glass opacities and small right pleural effusion.  Negative for pulmonary embolism.  Right middle lobe nodule 10 mm diameter (mild spiculation). 7 mm diameter nodule right lower lobe.   EKG 68 bpm, left axis, with left anterior fascicular block, atrial fibrillation rhythm with no significant ST segment or T wave changes.        Assessment and Plan: * Acute on chronic diastolic CHF (congestive heart failure) ([place]) 2021 echocardiogram with preserved LV systolic function, with mild to moderate TR.   Urine output is 5,277 ml  Systolic blood pressure 98 to 125 mmHg.   Her volume status has improved Because hypotension, will hold on  further diuresis and losartan.  Continue with carvedilol for now.  Add empagliflozin   Essential hypertension Hypotension.  Plan to hold on losartan and furosemide for now.   Dyslipidemia. Continue with statin therapy.   Atrial fibrillation, chronic (HCC) Continue rate control with carvedilol and anticoagulation with apixaban.   CKD (chronic kidney disease) stage 3, GFR 30-59 ml/min (HCC) Hyponatremia,   Renal function with serum cr at 0,78, with K at 3,8 and serum bicarbonate at 24.  Plan to continue close follow up renal function in am.  Intermittent urinary retention.  Continue bladder scan monitoring.   Carcinoid bronchial adenoma of right lung [facility]) Patient will follow up as outpatient         Subjective: patient is feeling better, her blood pressure has improved, she had urinary retention this morning required catheterization   Physical Exam: Vitals:   12/06/22 0006 12/06/22 0338 12/06/22 0500 12/06/22 0753  BP: (!) 165/81 (!) 170/99  (!) 123/59  Pulse: 68 63  80  Resp: (!) 24 (!) 24  18  Temp: 97.8 F (36.6 C) 97.7 F (36.5 C)  97.8 F (36.6 C)  TempSrc: Oral Oral  Oral  SpO2: 97% 100%  99%  Weight:   55.8 kg   Height:       Neurology awake and alert ENT with mild pallor Cardiovascular with S1 and S2 present, irregularly irregular with no gallops or murmurs No JVD No lower extremity edema Respiratory with no rales or wheezing Abdomen with no distention  Data Reviewed:    Family Communication: I spoke with patient's daughter at the  bedside, we talked in detail about patient's condition, plan of care and prognosis and all questions were addressed.   Disposition: Status is: Observation The patient remains OBS appropriate and will d/c before 2 midnights.  Planned Discharge Destination: Home      Author: Tawni Millers, MD 12/06/2022 1:41 PM  For on call review [email or web address].

## 2022-12-06 NOTE — Evaluation (Signed)
Physical Therapy Evaluation Patient Details Name: [name] MRN: [id number] DOB: 10-06-1934 Today's Date: 12/06/2022  History of Present Illness  [name] is a 89 y.o. female with medical history significant of chronic HFpEF, PAF on Eliquis, HTN, HLD, RLL adenocarcinoma status post lobectomy, presented with worsening of shortness of breath  Clinical Impression  Pt tolerated treatment well today. Pt was able to ambulate in the hallway with RW CGA and complete bed mobility independently however per pt daughter she is not at baseline. Educated pt and family that she would benefit from continued OOB mobility during acute stay with PT, mobility specialist, and nursing staff. Pt would also benefit from [place] following DC. No DME needs as pt owns needed DME.       Recommendations for follow up therapy are one component of a multi-disciplinary discharge planning process, led by the attending physician.  Recommendations may be updated based on patient status, additional functional criteria and insurance authorization.  Follow Up Recommendations Home health PT      Assistance Recommended at Discharge Intermittent Supervision/Assistance  Patient can return home with the following  A little help with walking and/or transfers;A little help with bathing/dressing/bathroom    Equipment Recommendations Other (comment) (Pt has needed DME at home)  Recommendations for Other Services       Functional Status Assessment Patient has had a recent decline in their functional status and demonstrates the ability to make significant improvements in function in a reasonable and predictable amount of time.     Precautions / Restrictions Precautions Precautions: Fall Restrictions Weight Bearing Restrictions: No      Mobility  Bed Mobility Overal bed mobility: Independent                  Transfers Overall transfer level: Needs assistance Equipment used: Rolling walker (2 wheels) Transfers:  Sit to/from Stand Sit to Stand: Min guard                Ambulation/Gait Ambulation/Gait assistance: Min guard Gait Distance (Feet): 150 Feet Assistive device: Rolling walker (2 wheels) Gait Pattern/deviations: WFL(Within Functional Limits) Gait velocity: Decreased        Stairs            Wheelchair Mobility    Modified Rankin (Stroke Patients Only)       Balance Overall balance assessment: No apparent balance deficits (not formally assessed)                                           Pertinent Vitals/Pain Pain Assessment Pain Assessment: No/denies pain    Home Living Family/patient expects to be discharged to:: Assisted living                 Home Equipment: Rollator (4 wheels);Rolling Walker (2 wheels);Grab bars - toilet;Grab bars - tub/shower      Prior Function Prior Level of Function : Independent/Modified Independent                     Hand Dominance   Dominant Hand: Right    Extremity/Trunk Assessment   Upper Extremity Assessment Upper Extremity Assessment: Overall WFL for tasks assessed    Lower Extremity Assessment Lower Extremity Assessment: Overall WFL for tasks assessed       Communication   Communication: HOH  Cognition Arousal/Alertness: Lethargic Behavior During Therapy: Flat affect (Stated that her  hearing aids needed to be charged) Overall Cognitive Status: Within Functional Limits for tasks assessed                                          General Comments General comments (skin integrity, edema, etc.): BP: 107/57 supine; 123/56 seated. VSS. O2 down to 91% on RA during ambulation but quickly recovered to 95%.    Exercises     Assessment/Plan    PT Assessment Patient needs continued PT services  PT Problem List Decreased activity tolerance;Decreased mobility;Decreased knowledge of use of DME;Cardiopulmonary status limiting activity       PT Treatment  Interventions DME instruction;Gait training;Functional mobility training;Patient/family education    PT Goals (Current goals can be found in the Care Plan section)  Acute Rehab PT Goals Patient Stated Goal: To get back home PT Goal Formulation: With patient/family Time For Goal Achievement: 12/20/22 Potential to Achieve Goals: Good    Frequency Min 3X/week     Co-evaluation               AM-PAC PT "6 Clicks" Mobility  Outcome Measure Help needed turning from your back to your side while in a flat bed without using bedrails?: None Help needed moving from lying on your back to sitting on the side of a flat bed without using bedrails?: None Help needed moving to and from a bed to a chair (including a wheelchair)?: None Help needed standing up from a chair using your arms (e.g., wheelchair or bedside chair)?: A Little Help needed to walk in hospital room?: A Little Help needed climbing 3-5 steps with a railing? : A Lot 6 Click Score: 20    End of Session Equipment Utilized During Treatment: Gait belt   Patient left: in bed;with call bell/phone within reach;with family/visitor present Nurse Communication: Mobility status PT Visit Diagnosis: Other abnormalities of gait and mobility (R26.89)    Time: 0935-1006 PT Time Calculation (min) (ACUTE ONLY): 31 min   Charges:   PT Evaluation $PT Eval Low Complexity: 1 Low PT Treatments $Gait Training: 8-22 mins        Shelby Mattocks, PT, DPT Acute Rehab Services [phone]   Viann Shove 12/06/2022, 12:00 PM

## 2022-12-07 ENCOUNTER — Other Ambulatory Visit (HOSPITAL_COMMUNITY): Payer: Self-pay

## 2022-12-07 LAB — BASIC METABOLIC PANEL
Anion gap: 9 (ref 5–15)
BUN: 13 mg/dL (ref 8–23)
CO2: 28 mmol/L (ref 22–32)
Calcium: 8.6 mg/dL — ABNORMAL LOW (ref 8.9–10.3)
Chloride: 91 mmol/L — ABNORMAL LOW (ref 98–111)
Creatinine, Ser: 0.92 mg/dL (ref 0.44–1.00)
GFR, Estimated: >60 mL/min (ref >60)
Glucose, Bld: 96 mg/dL (ref 70–99)
Potassium: 3.9 mmol/L (ref 3.5–5.1)
Sodium: 128 mmol/L — ABNORMAL LOW (ref 135–145)

## 2022-12-07 LAB — MAGNESIUM: Magnesium: 2 mg/dL (ref 1.7–2.4)

## 2022-12-07 MED ORDER — TORSEMIDE 20 MG PO TABS
20.0000 mg | ORAL_TABLET | Freq: Every day | ORAL | Status: DC
  Administered 2022-12-07 – 2022-12-08 (×2): 20 mg via ORAL
  Filled 2022-12-07 (×2): qty 1

## 2022-12-07 MED ORDER — EMPAGLIFLOZIN 10 MG PO TABS
10.0000 mg | ORAL_TABLET | Freq: Every day | ORAL | Status: DC
  Administered 2022-12-07 – 2022-12-08 (×2): 10 mg via ORAL
  Filled 2022-12-07 (×2): qty 1

## 2022-12-07 MED ORDER — LOSARTAN POTASSIUM 25 MG PO TABS
25.0000 mg | ORAL_TABLET | Freq: Every day | ORAL | Status: DC
  Administered 2022-12-07 – 2022-12-09 (×3): 25 mg via ORAL
  Filled 2022-12-07 (×3): qty 1

## 2022-12-07 NOTE — Progress Notes (Addendum)
   Heart Failure Stewardship Pharmacist Progress Note   PCP: [facility] PCP-Cardiologist: None    HPI:  89 yo F with PMH of CHF, afib, HTN, HLD, RLL adenocarcinoma s/p lobectomy.   Presented to the ED on 2/21 with shortness of breath and LE edema. She was seen by outpatient cardiology and medications were adjusted for abdominal distention and LE edema following amlodipine. A couple weeks ago she was starting to have increased exertional dyspnea and hypertensive - losartan was increased to 50 mg daily. Her shortness of breath continued, prompting her to come to the ED for evaluation. CXR with trace R pleural effusion and mild cardiomegaly. CTA with pulmonary nodules and no PE. An ECHO was done 2/22 and LVEF is 55-60% (was 60-65% in 2021), mild LVH, no regional wall motion abnormalities, RV mildly reduced.   Current HF Medications: Diuretic: torsemide 20 mg daily Beta Blocker: carvedilol 3.125 mg BID ACE/ARB/ARNI: losartan 25 mg daily SGLT2i: Jardiance 10 mg daily  Prior to admission HF Medications: Diuretic: torsemide 20 mg daily PRN Beta blocker: carvedilol 3.125 mg BID ACE/ARB/ARNI: losartan 25 mg daily  Pertinent Lab Values: Serum creatinine 0.92, BUN 13, Potassium 3.9, Sodium 128, BNP 382.7, Magnesium 2.0  Vital Signs: Weight: 123 lbs (admission weight: 123 lbs) Blood pressure: 140/70s  Heart rate: 50-70s  I/O: -1.4L yesterday; net -1.5L  Medication Assistance / Insurance Benefits Check: Does the patient have prescription insurance?  Yes Type of insurance plan: [facility] Medicare  Outpatient Pharmacy:  Prior to admission outpatient pharmacy: [facility] Is the patient willing to use [facility] at discharge?  No - receives meds via delivery from [facility], can get meds same day Is the patient willing to transition their outpatient pharmacy to utilize a [facility] outpatient pharmacy?   No - receives meds via delivery from [facility]: 1. Acute on chronic diastolic CHF (LVEF 23-53%). NYHA class III symptoms. - Agree with starting torsemide 20 mg daily. Strict I/Os and daily weights. Keep K>4 and Mg>2. - Continue carvedilol 3.125 mg BID - Agree with restarting losartan 25 mg daily - Consider adding MRA before discharge pending BP trends - Agree with starting Jardiance 10 mg daily   Plan: 1) Medication changes recommended at this time: - Agree with changes  2) Patient assistance: - Jardiance copay $35  3)  Education  - Patient has been educated on current HF medications and potential additions to HF medication regimen - Patient verbalizes understanding that over the next few months, these medication doses may change and more medications may be added to optimize HF regimen - Patient has been educated on basic disease state pathophysiology and goals of therapy   Kerby Nora, PharmD, BCPS Heart Failure Stewardship Pharmacist Phone [phone]

## 2022-12-07 NOTE — TOC Benefit Eligibility Note (Signed)
Patient Teacher, English as a foreign language completed.    The patient is currently admitted and upon discharge could be taking Jardiance 10 mg.  The current 30 day co-pay is $35.00.   The patient is insured through [place] [place] Patient Advocate Specialist [place] Patient Advocate Team Direct Number: [phone]  Fax: [phone]

## 2022-12-07 NOTE — Progress Notes (Signed)
Heart Failure Nurse Navigator Progress Note  Assessed for HV TOC readiness. Offered appt upon acute hospitalization discharge. Pt declined HV TOC appt. States she is from [facility] independent living. Has medical team there she works closely with. The center provides meals. Pt sets up her own medications, utilizes local pharmacy that delivers same-day meds to facility. Offers PT/OT services and nurse 5 days per week as ordered.  Pt states it would be hard for her and her daughter to coordinate and get her to an appointment. Reviewed medication changes with patient and HF pharmacist, educated patient at bedside.   Pt feels confident in new changes and plan of stay at the hospital until (potentially) Monday.   No HV TOC appt scheduled per pt request.    Pricilla Holm, MSN, RN Heart Failure Nurse Navigator

## 2022-12-07 NOTE — Progress Notes (Signed)
Mobility Specialist - Progress Note   12/07/22 1012  Mobility  Activity Ambulated with assistance in hallway  Level of Assistance Standby assist, set-up cues, supervision of patient - no hands on  Assistive Device Front wheel walker  Distance Ambulated (ft) 300 ft  Activity Response Tolerated well  Mobility Referral Yes  $Mobility charge 1 Mobility    Pt received in bed agreeable to mobility. No complaints throughout, tolerated increased distance well. Left in bed w/ bed alarm on and call bell in her lap.   [place] Specialist Please contact via SecureChat or Rehab office at [phone]

## 2022-12-07 NOTE — Progress Notes (Signed)
Progress Note   Patient: RAYMIE GORDEN [id number] DOB: [birth year] DOA: 12/05/2022     1 DOS: the patient was seen and examined on 12/07/2022   Brief hospital course: Mrs. Bosen was admitted to the hospital with the working diagnosis of decompensated heart failure.   89 yo female with the past medical history of heart failure, paroxysmal atrial fibrillation, lung cancer right lower lobe sp lobectomy and hypertension who presented with dyspnea. Her diuretic therapy was discontinued 3 weeks ago, one week later she developed dyspnea and uncontrolled hypertension. On her initial physical examination her blood pressure was 180/99, HR 70, RR 19 and 02 saturation 95%, lungs with bilateral rales with increased work of breathing with no wheezing, heart with S1 and S2 present and regular, abdomen with no distention and positive lower extremity edema.   Na 129, K 4,3 Cl 94, bicarbonate 25, glucose 103 bun 7 cr 0,79 BNP 382  High sensitive troponin 16 and 9  Wbc 4,3 hgb 11,4 plt 152  Sars covid 19 negative   Urine analysis SG 1,004, negative proteins and negative leukocytes.   Chest radiograph with cardiomegaly, bilateral hilar vascular congestion, with small right pleural effusion. CT chest with bilateral apical centrilobular emphysema, with bilateral ground glass opacities and small right pleural effusion.  Negative for pulmonary embolism.  Right middle lobe nodule 10 mm diameter (mild spiculation). 7 mm diameter nodule right lower lobe.   EKG 68 bpm, left axis, with left anterior fascicular block, atrial fibrillation rhythm with no significant ST segment or T wave changes.   02/23 volume status has improved, transition to oral regimen of diuretics. Monitor response before discharge.       Assessment and Plan: * Acute on chronic diastolic CHF (congestive heart failure) (HCC) Echocardiogram with preserved LV systolic function EF 55 to 60%, mild LVH, RV systolic function with mild reduction.  RVSP 55.2 mmHg. LA and RA with severe dilatation, moderate TR   Urine output is [id number]  ml  Systolic blood pressure [id number] to 147 mmHg.   Continue with empagliflozin and resume torsemide along with losartan. If blood pressure and renal function stable, plan to add spironolactone. Continue with losartan.   Essential hypertension Hypotension.   Resume losartan and torsemide. Continue close blood pressure monitoring.  Dyslipidemia, continue with statin therapy.   Atrial fibrillation, chronic (HCC) Continue rate control with carvedilol and anticoagulation with apixaban.   CKD (chronic kidney disease) stage 3, GFR 30-59 ml/min (HCC) Hyponatremia,   Renal function with serum cr at 0,92, K is 3,9 and serum bicarbonate at 28. Na 128 and Mg 2,0   Intermittent urinary retention.  Continue bladder scan monitoring.   Resume torsemide and follow up renal function and electrolytes in am.   Carcinoid bronchial adenoma of right lung [facility]) Patient will follow up as outpatient         Subjective: Patient with no chest pain or dyspnea, feeling pressure in her right chest.   Physical Exam: Vitals:   12/07/22 0115 12/07/22 0300 12/07/22 0401 12/07/22 0524  BP:  (!) 147/74    Pulse:  71    Resp: '16 16  18  '$ Temp:  97.8 F (36.6 C)    TempSrc:  Oral    SpO2:  90%    Weight:   56.2 kg   Height:       Neurology awake and alert ENT with mild pallor Cardiovascular with S1 and S2 present and rhythmic with mild systolic murmur at the apex. Respiratory with  no rales or wheezing, no rhonchi Abdomen with no distention  No lower extremity edema  Data Reviewed:    Family Communication: I spoke with patient's sister at the bedside, we talked in detail about patient's condition, plan of care and prognosis and all questions were addressed.   Disposition: Status is: Inpatient Remains inpatient appropriate because: heart failure   Planned Discharge Destination: Home    Author: Tawni Millers, MD 12/07/2022 11:16 AM  For on call review [email or web address].

## 2022-12-08 LAB — RENAL FUNCTION PANEL
Albumin: 3.6 g/dL (ref 3.5–5.0)
Anion gap: 15 (ref 5–15)
BUN: 15 mg/dL (ref 8–23)
CO2: 27 mmol/L (ref 22–32)
Calcium: 8.8 mg/dL — ABNORMAL LOW (ref 8.9–10.3)
Chloride: 89 mmol/L — ABNORMAL LOW (ref 98–111)
Creatinine, Ser: 1.08 mg/dL — ABNORMAL HIGH (ref 0.44–1.00)
GFR, Estimated: 50 mL/min — ABNORMAL LOW (ref >60)
Glucose, Bld: 87 mg/dL (ref 70–99)
Phosphorus: 4.6 mg/dL (ref 2.5–4.6)
Potassium: 3.4 mmol/L — ABNORMAL LOW (ref 3.5–5.1)
Sodium: 131 mmol/L — ABNORMAL LOW (ref 135–145)

## 2022-12-08 MED ORDER — POTASSIUM CHLORIDE CRYS ER 20 MEQ PO TBCR
40.0000 meq | EXTENDED_RELEASE_TABLET | Freq: Once | ORAL | Status: AC
  Administered 2022-12-08: 40 meq via ORAL
  Filled 2022-12-08: qty 2

## 2022-12-08 MED ORDER — SPIRONOLACTONE 12.5 MG HALF TABLET: 12.5000 mg | ORAL_TABLET | Freq: Every day | ORAL | Status: DC

## 2022-12-08 NOTE — Progress Notes (Signed)
Progress Note   Patient: [name] [id number] DOB: 09-05-1934 DOA: 12/05/2022     2 DOS: the patient was seen and examined on 12/08/2022   Brief hospital course: [name] was admitted to the hospital with the working diagnosis of decompensated heart failure.   89 yo female with the past medical history of heart failure, paroxysmal atrial fibrillation, lung cancer right lower lobe sp lobectomy and hypertension who presented with dyspnea. Her diuretic therapy was discontinued 3 weeks ago, one week later she developed dyspnea and uncontrolled hypertension. On her initial physical examination her blood pressure was 180/99, HR 70, RR 19 and 02 saturation 95%, lungs with bilateral rales with increased work of breathing with no wheezing, heart with S1 and S2 present and regular, abdomen with no distention and positive lower extremity edema.   Na 129, K 4,3 Cl 94, bicarbonate 25, glucose 103 bun 7 cr 0,79 BNP 382  High sensitive troponin 16 and 9  Wbc 4,3 hgb 11,4 plt 152  Sars covid 19 negative   Urine analysis SG 1,004, negative proteins and negative leukocytes.   Chest radiograph with cardiomegaly, bilateral hilar vascular congestion, with small right pleural effusion. CT chest with bilateral apical centrilobular emphysema, with bilateral ground glass opacities and small right pleural effusion.  Negative for pulmonary embolism.  Right middle lobe nodule 10 mm diameter (mild spiculation). 7 mm diameter nodule right lower lobe.   EKG 68 bpm, left axis, with left anterior fascicular block, atrial fibrillation rhythm with no significant ST segment or T wave changes.   02/23 volume status has improved, transition to oral regimen of diuretics. Monitor response before discharge.   02/24 Continue adjusting antihypertensive regimen. Possible discharge tomorrow.     Assessment and Plan: * Acute on chronic diastolic CHF (congestive heart failure) (HCC) Echocardiogram with preserved LV  systolic function EF 55 to 60%, mild LVH, RV systolic function with mild reduction. RVSP 55.2 mmHg. LA and RA with severe dilatation, moderate TR   Urine output is [id number]  ml  Systolic blood pressure 99991111 to 119 mmHg.   Considering urinary retention and past history of urinary tract infections, will discontinue SGLT 2 inh and will add spironolactone.  Continue losartan and carvedilol. If blood pressure stable, plan for discharge home tomorrow.   Essential hypertension Hypotension.   Resume losartan and torsemide. Continue close blood pressure monitoring.  Dyslipidemia, continue with statin therapy.   Atrial fibrillation, chronic (HCC) Continue rate control with carvedilol and anticoagulation with apixaban.   CKD (chronic kidney disease) stage 3, GFR 30-59 ml/min (HCC) CKD stage 3a. Hyponatremia, hypokalemia   Today renal function with serum cr at 1,0 with k at 3.4 and serum bicarbonate at 27. Na 131 and Mg 2.0   Intermittent urinary retention.  Continue bladder scan monitoring.   Follow up renal function in am. Add 40 Kcl for repletion.   Carcinoid bronchial adenoma of right lung [facility]) Patient will follow up as outpatient         Subjective: Patient is feeling better, her dyspnea has improved, no further abdominal pressure.   Physical Exam: Vitals:   12/07/22 2017 12/08/22 0028 12/08/22 0522 12/08/22 0716  BP: 134/66 113/61 (!) 101/56 (!) 119/51  Pulse: 77 72 (!) 56 63  Resp:  '17 18 18  '$ Temp:  97.7 F (36.5 C) 98.2 F (36.8 C) (!) 97.5 F (36.4 C)  TempSrc:   Oral Oral  SpO2: 92% 100% 100% 93%  Weight:   53.8 kg  Height:       Neurology awake and alert ENT with mild pallor Cardiovascular with S1 and S2 present, with loud P2, no gallops, positive systolic murmur at the right sternal border.  No JVD No lower extremity edema Respiratory with expiratory wheezing at bases bilaterally, with no rhonchi or rales Abdomen with no distention  Data  Reviewed:    Family Communication: I spoke with patient's daughter at the bedside, we talked in detail about patient's condition, plan of care and prognosis and all questions were addressed.   Disposition: Status is: Inpatient Remains inpatient appropriate because: heart failure   Planned Discharge Destination: Home    Author: Tawni Millers, MD 12/08/2022 10:35 AM  For on call review [email or web address].

## 2022-12-08 NOTE — Plan of Care (Signed)

## 2022-12-09 LAB — RENAL FUNCTION PANEL
Albumin: 3.5 g/dL (ref 3.5–5.0)
Anion gap: 11 (ref 5–15)
BUN: 18 mg/dL (ref 8–23)
CO2: 29 mmol/L (ref 22–32)
Calcium: 8.5 mg/dL — ABNORMAL LOW (ref 8.9–10.3)
Chloride: 89 mmol/L — ABNORMAL LOW (ref 98–111)
Creatinine, Ser: 1.13 mg/dL — ABNORMAL HIGH (ref 0.44–1.00)
GFR, Estimated: 47 mL/min — ABNORMAL LOW (ref >60)
Glucose, Bld: 89 mg/dL (ref 70–99)
Phosphorus: 5 mg/dL — ABNORMAL HIGH (ref 2.5–4.6)
Potassium: 3.7 mmol/L (ref 3.5–5.1)
Sodium: 129 mmol/L — ABNORMAL LOW (ref 135–145)

## 2022-12-09 MED ORDER — TORSEMIDE 20 MG PO TABS
10.0000 mg | ORAL_TABLET | Freq: Every day | ORAL | Status: DC
  Administered 2022-12-09: 10 mg via ORAL
  Filled 2022-12-09: qty 1

## 2022-12-09 MED ORDER — SPIRONOLACTONE 12.5 MG HALF TABLET: 12.5000 mg | ORAL_TABLET | Freq: Every day | ORAL | Status: DC

## 2022-12-09 MED ORDER — SPIRONOLACTONE 25 MG PO TABS: 12.5000 mg | ORAL_TABLET | Freq: Every day | ORAL | 0 refills | Status: DC

## 2022-12-09 MED ORDER — TORSEMIDE 10 MG PO TABS: 10.0000 mg | ORAL_TABLET | Freq: Every day | ORAL | 0 refills | Status: DC

## 2022-12-09 NOTE — Discharge Summary (Signed)
Physician Discharge Summary   Patient: [name] MRN: [id number] DOB: 10/21/1933  Admit date:     12/05/2022  Discharge date: 12/09/22  Discharge Physician: Tawni Millers   PCP: [facility]   Recommendations at discharge:    Patient will continue diuresis with torsemide 10 mg daily with instructions to increase to 20 mg daily in case of weight gain 2 to 3 lbs in 24 hrs or 5 lbs in 7 days.  Added spironolactone 12.5 mg daily. Follow up renal function and electrolytes in 7 days.  Follow up with [facility] in 7 to 10 days.  Follow up with Cardiology as outpatient.  Follow up right lung nodules as outpatient.   I spoke with patient's over the phone, we talked in detail about patient's condition, plan of care and prognosis and all questions were addressed.   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 [facility] Course: [name] was admitted to the hospital with the working diagnosis of decompensated heart failure.   89 yo female with the past medical history of heart failure, paroxysmal atrial fibrillation, lung cancer right lower lobe sp lobectomy and hypertension who presented with dyspnea. Her diuretic therapy was discontinued 3 weeks ago, one week later she developed dyspnea and uncontrolled hypertension. On her initial physical examination her blood pressure was 180/99, HR 70, RR 19 and 02 saturation 95%, lungs with bilateral rales with increased work of breathing with no wheezing, heart with S1 and S2 present and regular, abdomen with no distention and positive lower extremity edema.   Na 129, K 4,3 Cl 94,  bicarbonate 25, glucose 103 bun 7 cr 0,79 BNP 382  High sensitive troponin 16 and 9  Wbc 4,3 hgb 11,4 plt 152  Sars covid 19 negative   Urine analysis SG 1,004, negative proteins and negative leukocytes.   Chest radiograph with cardiomegaly, bilateral hilar vascular congestion, with small right pleural effusion. CT chest with bilateral apical centrilobular emphysema, with bilateral ground glass opacities and small right pleural effusion.  Negative for pulmonary embolism.  Right middle lobe nodule 10 mm diameter (mild spiculation). 7 mm diameter nodule right lower lobe.   EKG 68 bpm, left axis, with left anterior fascicular block, atrial fibrillation rhythm with no significant ST segment or T wave changes.   02/23 volume status has improved, transition to oral regimen of diuretics. Monitor response before discharge.   02/24 Continue adjusting antihypertensive regimen.  02/25 patient euvolemic, plan to continue medical therapy as outpatient with close follow up as outpatient.    Assessment and Plan: * Acute on chronic diastolic CHF (congestive heart failure) (HCC) Echocardiogram with preserved LV systolic function EF 55 to 60%, mild LVH, RV systolic function with mild reduction. RVSP 55.2 mmHg. LA and RA with severe dilatation, moderate TR   Patient was placed on IV furosemide for diuresis, negative fluid balance was achieved, - 5,733 ml, with significant improvement in her symptoms.   Patient will continue heart failure regimen with losartan and carvedilol. Diuresis with torsemide 10 mg daily with instruction to increase to 20 mg in case of volume overload.  Added spironolactone.  Patient had intermittent  urinary retention and history of urinary tract infections in the past, will hold on SGLT 2 inh.  Patient will need close follow up as outpatient.   Essential hypertension Patient had transitory hypotension during diuresis.  At the time of her discharge her blood pressure is stable  with systolic at [id number] to [id number] mmHg.  Plan to continue carvedilol and losartan.   Dyslipidemia, continue with statin therapy.   Atrial fibrillation, chronic (HCC) Continue rate control with carvedilol and anticoagulation with apixaban.   CKD (chronic kidney disease) stage 3, GFR 30-59 ml/min (HCC) CKD stage 3a. Hyponatremia, hypokalemia, hyper P  Patient with euvolemic at the time of her discharge with serum cr at 1,13 with K at 3,7 and serum bicarbonate at 29, Na 129 and mg 2,0 and p 5,0   Intermittent urinary retention.  Continue bladder scan monitoring.   Continue diuresis with torsemide and spironolactone, follow up renal function and electrolytes in 7 days.   Carcinoid bronchial adenoma of right lung [facility]) Patient will follow up as outpatient         Consultants: none  Procedures performed: none Disposition: Home independent living, [facility].  Diet recommendation:  Cardiac diet DISCHARGE MEDICATION: Allergies as of 12/09/2022       Reactions   Aspirin Rash        Medication List     STOP taking these medications    multivitamin with minerals Tabs tablet   potassium chloride SA 20 MEQ tablet Commonly known as: KLOR-CON M       TAKE these medications    acetaminophen 500 MG tablet Commonly known as: TYLENOL Take 2 tablets (1,000 mg total) by mouth every 6 (six) hours as needed for mild pain.   ALIGN PO Take by mouth.   bisacodyl 5 MG EC tablet Commonly known as: DULCOLAX Take 1 tablet (5 mg total) by mouth daily as needed for severe constipation.   Calcium Carbonate-Vitamin D 600-400 MG-UNIT tablet Take 1 tablet by mouth daily.   carvedilol 3.125 MG tablet Commonly known as: COREG Take 1 tablet (3.125 mg total) by mouth 2 (two) times daily with a meal.   Eliquis 2.5 MG Tabs tablet Generic drug: apixaban TAKE ONE TABLET BY MOUTH TWICE DAILY What changed: how much to take   Fish Oil 1000 MG Caps Take 1 capsule by mouth 2 (two) times  daily.   lactose free nutrition Liqd Take 237 mLs by mouth 2 (two) times daily between meals.   losartan 25 MG tablet Commonly known as: COZAAR Take 1 tablet (25 mg total) by mouth daily.   pantoprazole 40 MG tablet Commonly known as: PROTONIX Take 40 mg by mouth daily.   polyethylene glycol 17 g packet Commonly known as: MIRALAX / GLYCOLAX Take 17 g by mouth daily as needed for moderate constipation.   PRESERVISION AREDS PO Take by mouth.   rosuvastatin 10 MG tablet Commonly known as: CRESTOR TAKE ONE TABLET AT BEDTIME. What changed: when to take this   simethicone 80 MG chewable tablet Commonly known as: Gas-X Chew 1 tablet (80 mg total) by mouth every 6 (six) hours as needed for flatulence.   spironolactone 25 MG tablet Commonly known as: ALDACTONE Take 0.5 tablets (12.5 mg total) by mouth daily. Start taking on: December 10, 2022   Systane Complete 0.6 % Soln Generic drug: Propylene Glycol Place 1 drop into both eyes daily.   torsemide 10 MG tablet Commonly known as: DEMADEX Take 1 tablet (10 mg total) by mouth  daily. Increase to 2 tablets daily in case of weight gain 2 to 3 lbs in 24 hrs or 5 lbs in 7 days. What changed:  medication strength how much to take when to take this reasons to take this additional instructions   Vitamin D2 10 MCG (400 UNIT) Tabs Take 400 Units by mouth daily.        Discharge Exam: Filed Weights   12/07/22 0401 12/08/22 0522 12/09/22 0657  Weight: 56.2 kg 53.8 kg 52.1 kg   BP 115/72 (BP Location: Left Arm)   Pulse 60   Temp (!) 97.2 F (36.2 C) (Oral)   Resp 18   Ht 4' 11.02" (1.499 m)   Wt 52.1 kg   LMP 10/16/1975 (Approximate)   SpO2 100%   BMI 23.19 kg/m   Patient is feeling better, no dyspnea or edema, no chest pain.  Neurology awake and alert ENT with mild pallor Cardiovascular with S1 and S2 present and regular with no gallops, rubs or murmurs No JVD No lower extremity edema Respiratory with no rales  or wheezing Abdomen with no distention   Condition at discharge: stable  The results of significant diagnostics from this hospitalization (including imaging, microbiology, ancillary and laboratory) are listed below for reference.   Imaging Studies: ECHOCARDIOGRAM COMPLETE  Result Date: 12/06/2022    ECHOCARDIOGRAM REPORT   Patient Name:   [name] Date of Exam: 12/06/2022 Medical Rec #:  [id number]     Height:       59.0 in Accession #:    [id number]    Weight:       123.0 lb Date of Birth:  1934/03/12     BSA:          1.500 m Patient Age:    61 years      BP:           170/99 mmHg Patient Gender: F             HR:           75 bpm. Exam Location:  Inpatient Procedure: 2D Echo, Cardiac Doppler and Color Doppler Indications:    CHF-Acute Diastolic [id number]  History:        Patient has prior history of Echocardiogram examinations, most                 recent 06/02/2020. CHF, Arrythmias:Atrial Fibrillation; Risk                 Factors:Hypertension and Dyslipidemia. CKD, stage 3.  Sonographer:    Ronny Flurry Referring Phys: [id number] Rossiter  1. Left ventricular ejection fraction, by estimation, is 55 to 60%. The left ventricle has normal function. The left ventricle has no regional wall motion abnormalities. There is mild left ventricular hypertrophy. Left ventricular diastolic parameters are indeterminate.  2. Right ventricular systolic function is mildly reduced. The right ventricular size is mild-moderately enlarged. There is moderately elevated pulmonary artery systolic pressure. The estimated right ventricular systolic pressure is [id number] mmHg.  3. Left atrial size was severely dilated.  4. Right atrial size was severely dilated.  5. The mitral valve is degenerative. Trivial mitral valve regurgitation. No evidence of mitral stenosis.  6. Tricuspid valve regurgitation is moderate.  7. The aortic valve is abnormal. There is mild calcification of the aortic valve. There is mild thickening  of the aortic valve. Aortic valve regurgitation is trivial. Aortic valve sclerosis/calcification is present, without any evidence of aortic stenosis.  8.  The inferior vena cava is dilated in size with <50% respiratory variability, suggesting right atrial pressure of 15 mmHg.  9. Cannot exclude a small PFO. FINDINGS  Left Ventricle: Left ventricular ejection fraction, by estimation, is 55 to 60%. The left ventricle has normal function. The left ventricle has no regional wall motion abnormalities. The left ventricular internal cavity size was normal in size. There is  mild left ventricular hypertrophy. Left ventricular diastolic parameters are indeterminate. Right Ventricle: The right ventricular size is mild-moderately enlarged. No increase in right ventricular wall thickness. Right ventricular systolic function is mildly reduced. There is moderately elevated pulmonary artery systolic pressure. The tricuspid regurgitant velocity is 3.17 m/s, and with an assumed right atrial pressure of 15 mmHg, the estimated right ventricular systolic pressure is [id number] mmHg. Left Atrium: Left atrial size was severely dilated. Right Atrium: Right atrial size was severely dilated. Pericardium: There is no evidence of pericardial effusion. Mitral Valve: The mitral valve is degenerative in appearance. Mild mitral annular calcification. Trivial mitral valve regurgitation. No evidence of mitral valve stenosis. MV peak gradient, 4.8 mmHg. The mean mitral valve gradient is 2.0 mmHg. Tricuspid Valve: The tricuspid valve is normal in structure. Tricuspid valve regurgitation is moderate . No evidence of tricuspid stenosis. Aortic Valve: The aortic valve is abnormal. There is mild calcification of the aortic valve. There is mild thickening of the aortic valve. Aortic valve regurgitation is trivial. Aortic valve sclerosis/calcification is present, without any evidence of aortic stenosis. Aortic valve mean gradient measures 6.5 mmHg. Aortic valve  peak gradient measures 11.9 mmHg. Aortic valve area, by VTI measures 1.94 cm. Pulmonic Valve: The pulmonic valve was normal in structure. Pulmonic valve regurgitation is trivial. No evidence of pulmonic stenosis. Aorta: The aortic root is normal in size and structure. Venous: The inferior vena cava is dilated in size with less than 50% respiratory variability, suggesting right atrial pressure of 15 mmHg. IAS/Shunts: Cannot exclude a small PFO.  LEFT VENTRICLE PLAX 2D LVIDd:         3.70 cm   Diastology LVIDs:         2.50 cm   LV e' medial:    5.82 cm/s LV PW:         1.20 cm   LV E/e' medial:  15.1 LV IVS:        1.00 cm   LV e' lateral:   13.20 cm/s LVOT diam:     1.90 cm   LV E/e' lateral: 6.7 LV SV:         60 LV SV Index:   40 LVOT Area:     2.84 cm  RIGHT VENTRICLE             IVC RV S prime:     26.40 cm/s  IVC diam: 2.70 cm TAPSE (M-mode): 1.6 cm LEFT ATRIUM             Index        RIGHT ATRIUM           Index LA diam:        5.30 cm 3.53 cm/m   RA Area:     21.30 cm LA Vol (A2C):   59.7 ml 39.80 ml/m  RA Volume:   57.50 ml  38.33 ml/m LA Vol (A4C):   63.7 ml 42.46 ml/m LA Biplane Vol: 62.8 ml 41.86 ml/m  AORTIC VALVE AV Area (Vmax):    1.95 cm AV Area (Vmean):   1.79 cm AV Area (VTI):  1.94 cm AV Vmax:           172.50 cm/s AV Vmean:          118.000 cm/s AV VTI:            0.310 m AV Peak Grad:      11.9 mmHg AV Mean Grad:      6.5 mmHg LVOT Vmax:         118.40 cm/s LVOT Vmean:        74.680 cm/s LVOT VTI:          0.212 m LVOT/AV VTI ratio: 0.68  AORTA Ao Root diam: 2.90 cm Ao Asc diam:  3.00 cm MITRAL VALVE               TRICUSPID VALVE MV Area VTI:  2.32 cm     TR Peak grad:   40.2 mmHg MV Peak grad: 4.8 mmHg     TR Vmax:        317.00 cm/s MV Mean grad: 2.0 mmHg MV Vmax:      1.10 m/s     SHUNTS MV Vmean:     61.1 cm/s    Systemic VTI:  0.21 m MV E velocity: 87.80 cm/s  Systemic Diam: 1.90 cm Cherlynn Kaiser MD Electronically signed by Cherlynn Kaiser MD Signature Date/Time:  12/06/2022/4:13:02 PM    Final    CT Angio Chest PE W/Cm &/Or Wo Cm  Result Date: 12/05/2022 CLINICAL DATA:  Suspected pulmonary embolism in an 89 year old female. * Tracking Code: BO * EXAM: CT ANGIOGRAPHY CHEST WITH CONTRAST TECHNIQUE: Multidetector CT imaging of the chest was performed using the standard protocol during bolus administration of intravenous contrast. Multiplanar CT image reconstructions and MIPs were obtained to evaluate the vascular anatomy. RADIATION DOSE REDUCTION: This exam was performed according to the departmental dose-optimization program which includes automated exposure control, adjustment of the mA and/or kV according to patient size and/or use of iterative reconstruction technique. CONTRAST:  24m OMNIPAQUE IOHEXOL 350 MG/ML SOLN COMPARISON:  Comparison made with January 29, 2022. FINDINGS: Cardiovascular: Heart size is enlarged as on previous imaging. There is reflux of contrast into the hepatic veins though not as pronounced as on prior imaging. Three-vessel coronary artery disease. No pericardial effusion or sign of pericardial nodularity. Aortic atherosclerotic changes.  No aneurysmal dilation. Dense opacification of the pulmonary arterial bed. Maximal opacification of the main pulmonary artery at 719 Hounsfield units. Study is negative for pulmonary embolism. Mediastinum/Nodes: RIGHT axillary dissection changes post RIGHT mastectomy. No adenopathy. Lungs/Pleura: Areas of ground-glass with mosaic attenuation seen in the chest. Enlarging RIGHT-sided pleural effusion is now small to moderate layering along the posterior RIGHT chest. New middle lobe nodule (image 111/12) 7 x 6 mm. New LEFT lower lobe pulmonary nodule (image 107/12) 6-7 mm. Stable LEFT lower lobe pulmonary nodule (image 94/12) Stable RIGHT upper lobe pulmonary nodule (image 36/12) 5 mm. Area of nodularity in the RIGHT middle lobe (image 108/12) 14 x 5 mm appears more tubular and centered along bronchovascular  structures, not definitely seen previously. Scattered additional areas of subtle nodularity are similar. Scarring following RIGHT lower lobectomy similar to previous imaging. Airways are patent. No pneumothorax. Trace LEFT-sided pleural effusion. Upper Abdomen: Reflux of contrast into the a [place] veins. No acute upper abdominal process to the extent evaluated. Musculoskeletal: No acute musculoskeletal process. No destructive bone finding. Spinal degenerative changes. Review of the MIP images confirms the above findings. IMPRESSION: 1. Study is negative for pulmonary embolism. 2. Enlarging  RIGHT-sided pleural effusion is now small to moderate layering along the posterior RIGHT chest. Trace LEFT-sided pleural effusion. 3. Areas of ground-glass with mosaic attenuation seen in the chest. Findings could be seen in the setting of pulmonary edema or small airways disease. Would also include the possibility of viral infection. 4. New RIGHT middle lobe nodule with mean diameter of 10 mm. Mild spiculation suggested on coronal images. A small bronchogenic neoplasm not excluded in addition to metastatic process. Consider short interval follow-up, PET or biopsy for further assessment in this patient with history of breast cancer and pulmonary carcinoid. Multi disciplinary thoracic oncologic management is advised. 5. Additional 7 mm mean diameter nodule in the RIGHT lower lobe warrants attention on follow-up. 6. Other smaller new nodules and stable smaller nodules are of uncertain significance in a patient with history of carcinoid. Aortic Atherosclerosis (ICD10-I70.0). Electronically Signed   By: Zetta Bills M.D.   On: 12/05/2022 17:30   DG Chest Port 1 View  Result Date: 12/05/2022 CLINICAL DATA:  89 year old female with shortness of breath. EXAM: PORTABLE CHEST - 1 VIEW COMPARISON:  Seventeen FINDINGS: The mediastinal contours are within normal limits. Unchanged cardiomegaly. Similar appearing blunting of right  costophrenic angle. Mild bibasilar subsegmental atelectasis. No acute osseous abnormality. Surgical clips in right axilla IMPRESSION: 1. Trace right pleural effusion. 2. Bibasilar subsegmental atelectasis. 3. Unchanged mild cardiomegaly. Electronically Signed   By: Ruthann Cancer M.D.   On: 12/05/2022 13:29    Microbiology: Results for orders placed or performed during the hospital encounter of 12/05/22  Resp panel by RT-PCR (RSV, Flu A&B, Covid) Anterior Nasal Swab     Status: None   Collection Time: 12/05/22 12:58 PM   Specimen: Anterior Nasal Swab  Result Value Ref Range Status   SARS Coronavirus 2 by RT PCR NEGATIVE NEGATIVE Final   Influenza A by PCR NEGATIVE NEGATIVE Final   Influenza B by PCR NEGATIVE NEGATIVE Final    Comment: (NOTE) The Xpert Xpress SARS-CoV-2/FLU/RSV plus assay is intended as an aid in the diagnosis of influenza from Nasopharyngeal swab specimens and should not be used as a sole basis for treatment. Nasal washings and aspirates are unacceptable for Xpert Xpress SARS-CoV-2/FLU/RSV testing.  Fact Sheet for Patients: EntrepreneurPulse.com.au  Fact Sheet for Healthcare Providers: IncredibleEmployment.be  This test is not yet approved or cleared by the [place] FDA and has been authorized for detection and/or diagnosis of SARS-CoV-2 by FDA under an Emergency Use Authorization (EUA). This EUA will remain in effect (meaning this test can be used) for the duration of the COVID-19 declaration under Section 564(b)(1) of the Act, 21 U.S.C. section 360bbb-3(b)(1), unless the authorization is terminated or revoked.     Resp Syncytial Virus by PCR NEGATIVE NEGATIVE Final    Comment: (NOTE) Fact Sheet for Patients: EntrepreneurPulse.com.au  Fact Sheet for Healthcare Providers: IncredibleEmployment.be  This test is not yet approved or cleared by the [place] FDA and has been authorized  for detection and/or diagnosis of SARS-CoV-2 by FDA under an Emergency Use Authorization (EUA). This EUA will remain in effect (meaning this test can be used) for the duration of the COVID-19 declaration under Section 564(b)(1) of the Act, 21 U.S.C. section 360bbb-3(b)(1), unless the authorization is terminated or revoked.  Performed at [facility], [place] [address], [place], [place] [address]     Labs: CBC: Recent Labs  Lab 12/05/22 1315  WBC 4.3  NEUTROABS 3.6  HGB 11.4*  HCT 35.3*  MCV 98.1  PLT  0000000   Basic Metabolic Panel: Recent Labs  Lab 12/05/22 1315 12/06/22 0045 12/07/22 0046 12/08/22 0043 12/09/22 0038  NA 129* 130* 128* 131* 129*  K 4.3 3.8 3.9 3.4* 3.7  CL 94* 93* 91* 89* 89*  CO2 '25 24 28 27 29  '$ GLUCOSE 103* 105* 96 87 89  BUN 7* 7* '13 15 18  '$ CREATININE 0.79 0.78 0.92 1.08* 1.13*  CALCIUM 9.1 8.9 8.6* 8.8* 8.5*  MG 2.1  --  2.0  --   --   PHOS  --   --   --  4.6 5.0*   Liver Function Tests: Recent Labs  Lab 12/08/22 0043 12/09/22 0038  ALBUMIN 3.6 3.5   CBG: No results for input(s): "GLUCAP" in the last 168 hours.  Discharge time spent: greater than 30 minutes.  Signed: Tawni Millers, MD [facility] 12/09/2022

## 2022-12-09 NOTE — Progress Notes (Signed)
Pt being d/c, VSS, IV removed, Education complete.   Alvis Lemmings, RN 12/09/2022 12:18 PM

## 2022-12-10 ENCOUNTER — Telehealth: Payer: Self-pay

## 2022-12-10 ENCOUNTER — Other Ambulatory Visit (HOSPITAL_COMMUNITY): Payer: Medicare Other

## 2022-12-10 NOTE — Transitions of Care (Post Inpatient/ED Visit) (Signed)
   12/10/2022  Name: [name] MRN: [id number] DOB: 1933/11/10  Today's TOC FU Call Status:  Completed 12/10/2022 @ 10:04 am  Transition Care Management Follow-up Telephone Call  Spoke with patient  Items Reviewed:  Hospital visit  [place] and Equipment/Supplies:  N/A Functional Questionnaire:  Resides independently is nursing facility  [facility] up appointments reviewed:  Appointment made for 12/13/2022 @ [place] with Mast, Man X, NP @240  pm   SIGNATURE: [facility]

## 2022-12-11 ENCOUNTER — Ambulatory Visit (INDEPENDENT_AMBULATORY_CARE_PROVIDER_SITE_OTHER): Payer: Medicare Other | Admitting: Orthopedic Surgery

## 2022-12-11 ENCOUNTER — Encounter: Payer: Self-pay | Admitting: Orthopedic Surgery

## 2022-12-11 VITALS — BP 110/70 | HR 79 | Temp 97.1°F | Resp 16 | Ht 59.02 in | Wt 122.8 lb

## 2022-12-11 DIAGNOSIS — R911 Solitary pulmonary nodule: Secondary | ICD-10-CM | POA: Diagnosis not present

## 2022-12-11 DIAGNOSIS — I5041 Acute combined systolic (congestive) and diastolic (congestive) heart failure: Secondary | ICD-10-CM | POA: Diagnosis not present

## 2022-12-11 DIAGNOSIS — R350 Frequency of micturition: Secondary | ICD-10-CM | POA: Diagnosis not present

## 2022-12-11 LAB — POCT URINALYSIS DIPSTICK
Bilirubin, UA: NEGATIVE
Blood, UA: NEGATIVE
Glucose, UA: NEGATIVE
Ketones, UA: POSITIVE
Leukocytes, UA: NEGATIVE
Nitrite, UA: NEGATIVE
Protein, UA: NEGATIVE
Spec Grav, UA: <=1.005 — AB (ref 1.010–1.025)
Urobilinogen, UA: NEGATIVE E.U./dL — AB
pH, UA: 7 (ref 5.0–8.0)

## 2022-12-11 NOTE — Patient Instructions (Addendum)
Please schedule lab visit 03/04, also need routine visit at [facility] in 1 month  Please schedule cardiology follow up with Dr. Audie Box - [phone]  Please hydrate with water

## 2022-12-11 NOTE — Progress Notes (Signed)
Careteam: Patient Care Team: [facility], [place] as PCP - General Megan Salon, MD (Gynecology) Eston Esters, MD (Inactive) (Hematology and Oncology) Laurence Spates, MD (Inactive) (Gastroenterology) Mast, Man X, NP as Nurse Practitioner (Internal Medicine)  Seen by: Windell Moulding, AGNP-C  PLACE OF SERVICE:  [place] Directive information Does Patient Have a Medical Advance Directive?: Yes, Type of Advance Directive: Living will, Does patient want to make changes to medical advance directive?: No - Patient declined  Allergies  Allergen Reactions   Aspirin Rash    Chief Complaint  Patient presents with   Transitions Of Care    [facility] 12/05/2022-12/09/2022 for SOB.    Concern     Patient complains of possible UTI. Patient symptoms are unable to urinate during the night, and back pain.     HPI: Patient is a 89 y.o. female seen today for f/u s/p hospitalization 02/21-02/25 due to acute exacerbation of CHF.   She c/o sob prior to hospitalization. Rales noted on initial exam. O2 sat > 95% on room air. BNP was 382. CXR revealed cardiomegaly with bilateral hilar vascular congestion and small right pleural effusion. CT chest noted bilateral apical centrilobular emphysema and small right pleural effusion. Right middle lobe nodule 10 mm diameter and 7 mm nodule to right lower lobe noted. EKG left anterior fascicular block and atrial fibrillation. Echo preserved LV systolic function, EF 0000000. She received aggressive diuretics during hospitalization. Spironolactone added, also on torsemide. Advised to hold SGLT 2 inhibitor. Discharged back to [place].   Weight up 2 lbs this morning. She took extra torsemide 10 mg this morning. Denies sob or ankle edema. She is following low sodium diet.   02/26 she began to have increased frequency. She reports it seem worse than being on diuretics. First noticed at night. She also used Purewick during hospitalization. She  does have some dysuria, but claims skin near peri area ia raw due to thin toliet paper in hospital.   Review of Systems:  Review of Systems  Constitutional:  Negative for chills and fever.  HENT:  Negative for congestion and sore throat.   Eyes:  Negative for blurred vision.  Respiratory:  Negative for cough, shortness of breath and wheezing.   Cardiovascular:  Negative for chest pain, orthopnea and leg swelling.  Gastrointestinal:  Negative for abdominal pain and constipation.  Genitourinary:  Positive for dysuria and frequency. Negative for flank pain and hematuria.  Musculoskeletal:  Negative for falls and joint pain.  Skin:  Negative for rash.  Neurological:  Negative for dizziness, weakness and headaches.  Psychiatric/Behavioral:  Negative for depression. The patient is not nervous/anxious.     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   Right lower lobectomy   Diverticulitis 1999   Femur fracture ([place]) 03/2013   left, non weight bearing for 2 1/2 weeks   GERD (gastroesophageal reflux disease)    Hemorrhoids 1991   HTN (hypertension)    Lymphedema of upper extremity    right arm   Osteoporosis    PONV (postoperative nausea and vomiting)    Urinary, incontinence, stress female    Vitamin D deficiency disease    Past Surgical History:  Procedure Laterality Date   APPENDECTOMY  BREAST BIOPSY  05/04/1987   Right Dr Annamaria Boots   BREAST IMPLANT REMOVAL Right 06/05/2010   BUNIONECTOMY  10/15/1986   carcinoid Right    Right lower lobectomy 2017   COLONOSCOPY     COLONOSCOPY W/ POLYPECTOMY     EYE SURGERY  5/14 ; 6/14   Cataract Surgery   JOINT REPLACEMENT     2002 left; 2009 right Hip, rt knee 2012   KNEE ARTHROPLASTY Right 10/15/2010   [name] [facility]   LOBECTOMY Right  01/05/2016   Procedure: RIGHT LOWER LOBE LOBECTOMY;  Surgeon: Melrose Nakayama, MD;  Location: [facility];  Service: Thoracic;  Laterality: Right;   MASTECTOMY PARTIAL / LUMPECTOMY W/ AXILLARY LYMPHADENECTOMY  04/05/1987   Right - Dr Annamaria Boots   RESECTION OF MEDIASTINAL MASS N/A 01/05/2016   Procedure: RESECTION OF PLEURAL MASS, right;  Surgeon: Melrose Nakayama, MD;  Location: [place];  Service: Thoracic;  Laterality: N/A;   TONSILLECTOMY     TOTAL HIP ARTHROPLASTY     TUBAL LIGATION Bilateral 10/15/1968   VAGINAL HYSTERECTOMY  10/16/1975   myomata   VIDEO ASSISTED THORACOSCOPY (VATS)/WEDGE RESECTION Right 01/05/2016   Procedure: RIGHT VIDEO ASSISTED THORACOSCOPY (VATS)/WEDGE RESECTION;  Surgeon: Melrose Nakayama, MD;  Location: [place];  Service: Thoracic;  Laterality: Right;   Social History:   reports that she has never smoked. She has never used smokeless tobacco. She reports that she does not drink alcohol and does not use drugs.  Family History  Problem Relation Age of Onset   Colon cancer Mother    Colon cancer Father    Heart disease Father    Stomach cancer Neg Hx    Liver cancer Neg Hx    Esophageal cancer Neg Hx     Medications: Patient's Medications  New Prescriptions   No medications on file  Previous Medications   ACETAMINOPHEN (TYLENOL) 500 MG TABLET    Take 2 tablets (1,000 mg total) by mouth every 6 (six) hours as needed for mild pain.   APIXABAN (ELIQUIS) 2.5 MG TABS TABLET    TAKE ONE TABLET BY MOUTH TWICE DAILY   BISACODYL (DULCOLAX) 5 MG EC TABLET    Take 1 tablet (5 mg total) by mouth daily as needed for severe constipation.   CALCIUM CARBONATE-VITAMIN D 600-400 MG-UNIT TABLET    Take 1 tablet by mouth daily.   CARVEDILOL (COREG) 3.125 MG TABLET    Take 1 tablet (3.125 mg total) by mouth 2 (two) times daily with a meal.   ERGOCALCIFEROL (VITAMIN D2) 10 MCG (400 UNIT) TABS    Take 400 Units by mouth daily.   LACTOSE FREE NUTRITION (BOOST) LIQD    Take 237  mLs by mouth 2 (two) times daily between meals.   LOSARTAN (COZAAR) 25 MG TABLET    Take 1 tablet (25 mg total) by mouth daily.   OMEGA-3 FATTY ACIDS (FISH OIL) 1000 MG CAPS    Take 1 capsule by mouth 2 (two) times daily.   PANTOPRAZOLE (PROTONIX) 40 MG TABLET    Take 40 mg by mouth daily.   POLYETHYLENE GLYCOL (MIRALAX / GLYCOLAX) 17 G PACKET    Take 17 g by mouth daily as needed for moderate constipation.   PROBIOTIC PRODUCT (ALIGN PO)    Take by mouth.   PROPYLENE GLYCOL (SYSTANE COMPLETE) 0.6 % SOLN    Place 1 drop into both eyes daily.   ROSUVASTATIN (CRESTOR) 10 MG TABLET    TAKE ONE TABLET AT BEDTIME.  SIMETHICONE (GAS-X) 80 MG CHEWABLE TABLET    Chew 1 tablet (80 mg total) by mouth every 6 (six) hours as needed for flatulence.   SPIRONOLACTONE (ALDACTONE) 25 MG TABLET    Take 0.5 tablets (12.5 mg total) by mouth daily.   TORSEMIDE (DEMADEX) 10 MG TABLET    Take 1 tablet (10 mg total) by mouth daily. Increase to 2 tablets daily in case of weight gain 2 to 3 lbs in 24 hrs or 5 lbs in 7 days.  Modified Medications   No medications on file  Discontinued Medications   MULTIPLE VITAMINS-MINERALS (PRESERVISION AREDS PO)    Take by mouth.    Physical Exam:  Vitals:   12/11/22 1327  BP: 110/70  Pulse: 79  Resp: 16  Temp: (!) 97.1 F (36.2 C)  SpO2: 90%  Weight: 122 lb 12.8 oz (55.7 kg)  Height: 4' 11.02" (1.499 m)   Body mass index is 24.79 kg/m. Wt Readings from Last 3 Encounters:  12/11/22 122 lb 12.8 oz (55.7 kg)  12/09/22 114 lb 13.8 oz (52.1 kg)  11/16/22 121 lb 3.2 oz (55 kg)    Physical Exam Vitals reviewed.  Constitutional:      General: She is not in acute distress. HENT:     Head: Normocephalic.  Eyes:     General:        Right eye: No discharge.        Left eye: No discharge.  Cardiovascular:     Rate and Rhythm: Normal rate and regular rhythm.     Pulses: Normal pulses.     Heart sounds: Normal heart sounds.  Pulmonary:     Effort: Pulmonary effort is  normal. No respiratory distress.     Breath sounds: Normal breath sounds. No wheezing or rales.  Abdominal:     General: Bowel sounds are normal.     Palpations: Abdomen is soft.  Musculoskeletal:     Cervical back: Neck supple.     Right lower leg: No edema.     Left lower leg: No edema.     Comments: Ted hose on  Skin:    General: Skin is warm and dry.     Capillary Refill: Capillary refill takes less than 2 seconds.  Neurological:     General: No focal deficit present.     Mental Status: She is alert and oriented to person, place, and time.     Motor: Weakness present.     Gait: Gait abnormal.     Comments: rolator  Psychiatric:        Mood and Affect: Mood normal.        Behavior: Behavior normal.     Labs reviewed: Basic Metabolic Panel: Recent Labs    11/16/22 1019 12/05/22 1315 12/06/22 0045 12/07/22 0046 12/08/22 0043 12/09/22 0038  NA  --  129*   < > 128* 131* 129*  K  --  4.3   < > 3.9 3.4* 3.7  CL  --  94*   < > 91* 89* 89*  CO2  --  25   < > '28 27 29  '$ GLUCOSE  --  103*   < > 96 87 89  BUN  --  7*   < > '13 15 18  '$ CREATININE  --  0.79   < > 0.92 1.08* 1.13*  CALCIUM  --  9.1   < > 8.6* 8.8* 8.5*  MG  --  2.1  --  2.0  --   --  PHOS  --   --   --   --  4.6 5.0*  TSH 1.450  --   --   --   --   --    < > = values in this interval not displayed.   Liver Function Tests: Recent Labs    01/29/22 1631 12/08/22 0043 12/09/22 0038  AST 36  --   --   ALT 14  --   --   ALKPHOS 65  --   --   BILITOT 1.6*  --   --   PROT 7.5  --   --   ALBUMIN 3.9 3.6 3.5   No results for input(s): "LIPASE", "AMYLASE" in the last 8760 hours. No results for input(s): "AMMONIA" in the last 8760 hours. CBC: Recent Labs    01/29/22 1631 11/16/22 1019 12/05/22 1315  WBC 4.9 4.0 4.3  NEUTROABS 3.6  --  3.6  HGB 12.4 11.9 11.4*  HCT 39.7 37.0 35.3*  MCV 103.4* 97 98.1  PLT 178 159 152   Lipid Panel: No results for input(s): "CHOL", "HDL", "LDLCALC", "TRIG",  "CHOLHDL", "LDLDIRECT" in the last 8760 hours. TSH: Recent Labs    11/16/22 1019  TSH 1.450   A1C: No results found for: "HGBA1C"   Assessment/Plan 1. Urinary frequency - onset x 1 day - increased frequency at night - recent use of purewick in hospital - encourage hydration with water - POC Urinalysis Dipstick> - leukocytes and nitrates - Basic Metabolic Panel with eGFR; Future - urine culture  2. Acute combined systolic and diastolic congestive heart failure ([place]) - hospitalized 02/21- 02/25 - BNP 382 - CXR revealed cardiomegaly with bilateral hilar vascular congestion and small right pleural effusion  - CT chest noted bilateral apical centrilobular emphysema and small right pleural effusion - spirolactone added - cont torsemide - weight gain 2 lbs today> she took extra torsemide 10 mg  - exam unremarkable today> lung clear no ankle edema - bmp scheduled 03/02 at [facility] clinic - needs to schedule cardiology follow up - Basic Metabolic Panel with eGFR; Future  3. Pulmonary nodule - CT chest noted right middle lobe nodule 10 mm diameter and 7 mm nodule to right lower lobe  - family to schedule with Dr. Sabra Heck to discuss  Total time: 33 minutes. Greater than 50% of total time spent doing patient education regarding heart failure, UTI and pulmonary nodules including symptom/medication management.     Next appt: I asked that the patient see me for an immediate exam if there is another episode of this problem.  [place], [facility] [phone]

## 2022-12-12 LAB — URINE CULTURE
MICRO NUMBER:: 14621534
SPECIMEN QUALITY:: ADEQUATE

## 2022-12-13 ENCOUNTER — Encounter: Payer: Medicare Other | Admitting: Nurse Practitioner

## 2022-12-16 NOTE — Progress Notes (Unsigned)
Cardiology Office Note:   Date:  12/17/2022  NAME:  [name]    MRN: [id number] DOB:  1934-02-12   PCP:  [place], [place]  Cardiologist:  None  Electrophysiologist:  None   Referring MD: [facility], [place]*   Chief Complaint  Patient presents with   Follow-up        History of Present Illness:   [name] is a 89 y.o. female with a hx of permanent Afib, HFpEF, HTN who presents for follow-up. Admitted last month for acute HFpEF.  She was admitted to the hospital.  Acute diastolic heart failure.  She was diuresed net -5.8 L.  Her weights have remained largely unchanged.  She reports difficulty urinating due to PureWick use.  This has improved.  She was discharged and is taking torsemide 10 mg daily with an extra tablet every third day.  We discussed going to 20 mg daily.  She did leave the hospital with a sodium of 129.  It was trending down the day of discharge despite adequate diuresis.  We need to recheck labs today including a CBC.  She denies any chest pain.  She does report feeling a bit dizzy and imbalance.  She has not had physical therapy yet.  She is trying to walk more but we want her to walk in a safe way.  EKG confirms A-fib.  Pulse seems to be stable.  Will stop the carvedilol and the seem to cause an issue.  Will continue this for now.  She denies any significant chest pain.  EKG is stable.  Her volume status is quite acceptable today in office.  [facility]   Problem List 1. Permanent Afib -CHADSVASC=4 2. Carcinoid lung tumor  3. Cirrhosis  -on CT A/P 2020 4. Hyponatremia -SIADH? 5. HTN 6. HFpEF  -EF 60-65% 7. 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   Right lower  lobectomy   Diverticulitis 1999   Femur fracture ([place]) 03/2013   left, non weight bearing for 2 1/2 weeks   GERD (gastroesophageal reflux disease)    Hemorrhoids 1991   HTN (hypertension)    Lymphedema of upper extremity    right arm   Osteoporosis    PONV (postoperative nausea and vomiting)    Urinary, incontinence, stress female    Vitamin D deficiency disease     Past Surgical History: Past Surgical History:  Procedure Laterality Date   APPENDECTOMY     BREAST BIOPSY  05/04/1987   Right Dr Annamaria Boots   BREAST IMPLANT REMOVAL Right 06/05/2010   BUNIONECTOMY  10/15/1986   carcinoid Right    Right lower lobectomy 2017   COLONOSCOPY     COLONOSCOPY W/ POLYPECTOMY     EYE SURGERY  5/14 ; 6/14   Cataract Surgery   JOINT REPLACEMENT     2002 left; 2009 right Hip, rt knee 2012   KNEE ARTHROPLASTY Right 10/15/2010   [name]   LOBECTOMY Right 01/05/2016   Procedure: RIGHT LOWER LOBE LOBECTOMY;  Surgeon: Melrose Nakayama, MD;  Location: [facility];  Service: Thoracic;  Laterality: Right;   MASTECTOMY PARTIAL / LUMPECTOMY W/ AXILLARY LYMPHADENECTOMY  04/05/1987   Right -  Dr Annamaria Boots   RESECTION OF MEDIASTINAL MASS N/A 01/05/2016   Procedure: RESECTION OF PLEURAL MASS, right;  Surgeon: Melrose Nakayama, MD;  Location: [facility];  Service: Thoracic;  Laterality: N/A;   TONSILLECTOMY     TOTAL HIP ARTHROPLASTY     TUBAL LIGATION Bilateral 10/15/1968   VAGINAL HYSTERECTOMY  10/16/1975   [place]   VIDEO ASSISTED THORACOSCOPY (VATS)/WEDGE RESECTION Right 01/05/2016   Procedure: RIGHT VIDEO ASSISTED THORACOSCOPY (VATS)/WEDGE RESECTION;  Surgeon: Melrose Nakayama, MD;  Location: [facility];  Service: Thoracic;  Laterality: Right;    Current Medications: Current Meds  Medication Sig   acetaminophen (TYLENOL) 500 MG tablet Take 2 tablets (1,000 mg total) by mouth every 6 (six) hours as needed for mild pain.   apixaban (ELIQUIS) 2.5 MG TABS tablet TAKE ONE TABLET BY MOUTH TWICE DAILY    bisacodyl (DULCOLAX) 5 MG EC tablet Take 1 tablet (5 mg total) by mouth daily as needed for severe constipation.   Calcium Carbonate-Vitamin D 600-400 MG-UNIT tablet Take 1 tablet by mouth daily.   carvedilol (COREG) 3.125 MG tablet Take 1 tablet (3.125 mg total) by mouth 2 (two) times daily with a meal.   Ergocalciferol (VITAMIN D2) 10 MCG (400 UNIT) TABS Take 400 Units by mouth daily.   lactose free nutrition (BOOST) LIQD Take 237 mLs by mouth 2 (two) times daily between meals.   losartan (COZAAR) 25 MG tablet Take 1 tablet (25 mg total) by mouth daily.   Omega-3 Fatty Acids (FISH OIL) 1000 MG CAPS Take 1 capsule by mouth 2 (two) times daily.   pantoprazole (PROTONIX) 40 MG tablet Take 40 mg by mouth daily.   polyethylene glycol (MIRALAX / GLYCOLAX) 17 g packet Take 17 g by mouth daily as needed for moderate constipation.   Probiotic Product (ALIGN PO) Take by mouth.   Propylene Glycol (SYSTANE COMPLETE) 0.6 % SOLN Place 1 drop into both eyes daily.   rosuvastatin (CRESTOR) 10 MG tablet TAKE ONE TABLET AT BEDTIME.   simethicone (GAS-X) 80 MG chewable tablet Chew 1 tablet (80 mg total) by mouth every 6 (six) hours as needed for flatulence.   spironolactone (ALDACTONE) 25 MG tablet Take 0.5 tablets (12.5 mg total) by mouth daily.   [DISCONTINUED] torsemide (DEMADEX) 10 MG tablet Take 1 tablet (10 mg total) by mouth daily. Increase to 2 tablets daily in case of weight gain 2 to 3 lbs in 24 hrs or 5 lbs in 7 days.     Allergies:    Aspirin   Social History: Social History   Socioeconomic History   Marital status: Widowed    Spouse name: Not on file   Number of children: Not on file   Years of education: Not on file   Highest education level: Not on file  Occupational History   Not on file  Tobacco Use   Smoking status: Never   Smokeless tobacco: Never  Vaping Use   Vaping Use: Never used  Substance and Sexual Activity   Alcohol use: No    Alcohol/week: 0.0 standard drinks of  alcohol   Drug use: No   Sexual activity: Not Currently    Partners: Male    Birth control/protection: Surgical    Comment: hysterectomy  Other Topics Concern   Not on file  Social History Narrative   Not on file   Social Determinants of Health   Financial Resource Strain: Not on file  Food Insecurity: No Food Insecurity (12/05/2022)   Hunger Vital Sign  Worried About Charity fundraiser in the Last Year: Never true    [place] in the Last Year: Never true  Transportation Needs: No Transportation Needs (12/05/2022)   PRAPARE - Hydrologist (Medical): No    Lack of Transportation (Non-Medical): No  Physical Activity: Not on file  Stress: Not on file  Social Connections: Not on file     Family History: The patient's family history includes Colon cancer in her father and mother; Heart disease in her father. There is no history of Stomach cancer, Liver cancer, or Esophageal cancer.  ROS:   All other ROS reviewed and negative. Pertinent positives noted in the HPI.     EKGs/Labs/Other Studies Reviewed:   The following studies were personally reviewed by me today:  EKG:  EKG is ordered today.  The ekg ordered today demonstrates Afib 67 bpm, and was personally reviewed by me.   TTE 12/06/2022  1. Left ventricular ejection fraction, by estimation, is 55 to 60%. The  left ventricle has normal function. The left ventricle has no regional  wall motion abnormalities. There is mild left ventricular hypertrophy.  Left ventricular diastolic parameters  are indeterminate.   2. Right ventricular systolic function is mildly reduced. The right  ventricular size is mild-moderately enlarged. There is moderately elevated  pulmonary artery systolic pressure. The estimated right ventricular  systolic pressure is [id number] mmHg.   3. Left atrial size was severely dilated.   4. Right atrial size was severely dilated.   5. The mitral valve is degenerative. Trivial  mitral valve regurgitation.  No evidence of mitral stenosis.   6. Tricuspid valve regurgitation is moderate.   7. The aortic valve is abnormal. There is mild calcification of the  aortic valve. There is mild thickening of the aortic valve. Aortic valve  regurgitation is trivial. Aortic valve sclerosis/calcification is present,  without any evidence of aortic  stenosis.   8. The inferior vena cava is dilated in size with <50% respiratory  variability, suggesting right atrial pressure of 15 mmHg.   9. Cannot exclude a small PFO.   Recent Labs: 01/29/2022: ALT 14 11/16/2022: TSH 1.450 12/05/2022: B Natriuretic Peptide 382.7; Hemoglobin 11.4; Platelets 152 12/07/2022: Magnesium 2.0 12/09/2022: BUN 18; Creatinine, Ser 1.13; Potassium 3.7; Sodium 129   Recent Lipid Panel    Component Value Date/Time   CHOL 119 01/05/2021 0715   TRIG 89 01/05/2021 0715   HDL 50 01/05/2021 0715   CHOLHDL 2.4 01/05/2021 0715   VLDL 10 04/09/2007 0915   LDLCALC 52 01/05/2021 0715   LDLDIRECT 107.8 04/09/2007 0915    Physical Exam:   VS:  BP 136/81 (BP Location: Left Arm, Patient Position: Sitting, Cuff Size: Normal)   Pulse 67   Ht '4\' 11"'$  (1.499 m)   Wt 124 lb 12.8 oz (56.6 kg)   LMP 10/16/1975 (Approximate)   SpO2 98%   BMI 25.21 kg/m    Wt Readings from Last 3 Encounters:  12/17/22 124 lb 12.8 oz (56.6 kg)  12/11/22 122 lb 12.8 oz (55.7 kg)  12/09/22 114 lb 13.8 oz (52.1 kg)    General: Well nourished, well developed, in no acute distress Head: Atraumatic, normal size  Eyes: PEERLA, EOMI  Neck: Supple, no JVD Endocrine: No thryomegaly Cardiac: Normal S1, S2; irregular rhythm Lungs: Diminished breath sounds bilaterally Abd: Soft, nontender, no hepatomegaly  Ext: No edema, pulses 2+ Musculoskeletal: No deformities, BUE and BLE strength normal and equal Skin: Warm  and dry, no rashes   Neuro: Alert and oriented to person, place, time, and situation, CNII-XII grossly intact, no focal deficits   Psych: Normal mood and affect   ASSESSMENT:   [name] is a 89 y.o. female who presents for the following: 1. Chronic diastolic heart failure ([place])   2. Permanent atrial fibrillation ([place])   3. Acquired thrombophilia ([place])   4. Essential hypertension     PLAN:   1. Chronic diastolic heart failure ([place]) -Recent admission for diastolic heart failure.  Euvolemic on examination.  Continue Aldactone 12.5 mg daily.  We will increase her torsemide to 20 mg daily.  We need to check her BMP today.  Her creatinine was creeping up.  Sodium was also a bit low.  She was admitted to the hospital with an SIADH picture.  We need to make sure this is not reoccurring.  We also will check her CBC today.  2. Permanent atrial fibrillation ([place]) 3. Acquired thrombophilia ([facility]) -Rate control.  Permanent.  Coreg 3.25 mg twice daily.  Continue Eliquis 2.5 mg twice daily.  Appropriate dose for her age and weight.  4. Essential hypertension -Well-controlled.  She is on losartan, Aldactone and carvedilol.  We will keep a close eye on her kidney profile today.  As long as her blood pressure values are less than 140/90 I am okay with this given her advanced age.  Disposition: Return in about 3 months (around 03/19/2023).  Medication Adjustments/Labs and Tests Ordered: Current medicines are reviewed at length with the patient today.  Concerns regarding medicines are outlined above.  Orders Placed This Encounter  Procedures   CBC   Basic metabolic panel   EKG [id number]   Meds ordered this encounter  Medications   torsemide (DEMADEX) 20 MG tablet    Sig: Take 1 tablet (20 mg total) by mouth daily.    Dispense:  30 tablet    Refill:  3    Patient Instructions  Medication Instructions:  Increase Torsemide to 20 mg daily   *If you need a refill on your cardiac medications before your next appointment, please call your pharmacy*   Lab Work: CBC, BMET today   If you have labs (blood work) drawn today  and your tests are completely normal, you will receive your results only by: [place] (if you have MyChart) OR A paper copy in the mail If you have any lab test that is abnormal or we need to change your treatment, we will call you to review the results.   Follow-Up: At [facility], you and your health needs are our priority.  As part of our continuing mission to provide you with exceptional heart care, we have created designated Provider Care Teams.  These Care Teams include your primary Cardiologist (physician) and Advanced Practice Providers (APPs -  Physician Assistants and Nurse Practitioners) who all work together to provide you with the care you need, when you need it.  We recommend signing up for the patient portal called "MyChart".  Sign up information is provided on this After Visit Summary.  MyChart is used to connect with patients for Virtual Visits (Telemedicine).  Patients are able to view lab/test results, encounter notes, upcoming appointments, etc.  Non-urgent messages can be sent to your provider as well.   To learn more about what you can do with MyChart, go to [email or web address].    Your next appointment:   Keep scheduled appointment   Provider:   Eleonore Chiquito, MD  Time Spent with Patient: I have spent a total of 35 minutes with patient reviewing hospital notes, telemetry, EKGs, labs and examining the patient as well as establishing an assessment and plan that was discussed with the patient.  > 50% of time was spent in direct patient care.  Signed, Addison Naegeli. Audie Box, MD, [place]  [address], [place], [place] [address] [phone]  12/17/2022 2:24 PM

## 2022-12-17 ENCOUNTER — Ambulatory Visit: Payer: Medicare Other | Attending: Cardiovascular Disease | Admitting: Cardiovascular Disease

## 2022-12-17 ENCOUNTER — Encounter: Payer: Self-pay | Admitting: Cardiovascular Disease

## 2022-12-17 VITALS — BP 136/81 | HR 67 | Ht 59.0 in | Wt 124.8 lb

## 2022-12-17 DIAGNOSIS — I10 Essential (primary) hypertension: Secondary | ICD-10-CM | POA: Diagnosis not present

## 2022-12-17 DIAGNOSIS — I4821 Permanent atrial fibrillation: Secondary | ICD-10-CM

## 2022-12-17 DIAGNOSIS — I5032 Chronic diastolic (congestive) heart failure: Secondary | ICD-10-CM | POA: Diagnosis not present

## 2022-12-17 DIAGNOSIS — D6869 Other thrombophilia: Secondary | ICD-10-CM | POA: Diagnosis not present

## 2022-12-17 MED ORDER — TORSEMIDE 20 MG PO TABS: 20.0000 mg | ORAL_TABLET | Freq: Every day | ORAL | 3 refills | Status: DC

## 2022-12-17 NOTE — Patient Instructions (Signed)
Medication Instructions:  Increase Torsemide to 20 mg daily   *If you need a refill on your cardiac medications before your next appointment, please call your pharmacy*   Lab Work: CBC, BMET today   If you have labs (blood work) drawn today and your tests are completely normal, you will receive your results only by: [place] (if you have MyChart) OR A paper copy in the mail If you have any lab test that is abnormal or we need to change your treatment, we will call you to review the results.   Follow-Up: At [facility], you and your health needs are our priority.  As part of our continuing mission to provide you with exceptional heart care, we have created designated Provider Care Teams.  These Care Teams include your primary Cardiologist (physician) and Advanced Practice Providers (APPs -  Physician Assistants and Nurse Practitioners) who all work together to provide you with the care you need, when you need it.  We recommend signing up for the patient portal called "MyChart".  Sign up information is provided on this After Visit Summary.  MyChart is used to connect with patients for Virtual Visits (Telemedicine).  Patients are able to view lab/test results, encounter notes, upcoming appointments, etc.  Non-urgent messages can be sent to your provider as well.   To learn more about what you can do with MyChart, go to [email or web address].    Your next appointment:   Keep scheduled appointment   Provider:   Eleonore Chiquito, MD

## 2022-12-18 ENCOUNTER — Telehealth: Payer: Self-pay | Admitting: Cardiovascular Disease

## 2022-12-18 ENCOUNTER — Other Ambulatory Visit: Payer: Medicare Other

## 2022-12-18 ENCOUNTER — Other Ambulatory Visit: Payer: Self-pay

## 2022-12-18 LAB — CBC
Hematocrit: 34.4 % (ref 34.0–46.6)
Hemoglobin: 11.3 g/dL (ref 11.1–15.9)
MCH: 31.2 pg (ref 26.6–33.0)
MCHC: 32.8 g/dL (ref 31.5–35.7)
MCV: 95 fL (ref 79–97)
Platelets: 191 10*3/uL (ref 150–450)
RBC: 3.62 x10E6/uL — ABNORMAL LOW (ref 3.77–5.28)
RDW: 13.4 % (ref 11.7–15.4)
WBC: 4.4 10*3/uL (ref 3.4–10.8)

## 2022-12-18 LAB — BASIC METABOLIC PANEL
BUN/Creatinine Ratio: 20 (ref 12–28)
BUN: 19 mg/dL (ref 8–27)
CO2: 26 mmol/L (ref 20–29)
Calcium: 9.5 mg/dL (ref 8.7–10.3)
Chloride: 90 mmol/L — ABNORMAL LOW (ref 96–106)
Creatinine, Ser: 0.96 mg/dL (ref 0.57–1.00)
Glucose: 81 mg/dL (ref 70–99)
Potassium: 4.8 mmol/L (ref 3.5–5.2)
Sodium: 133 mmol/L — ABNORMAL LOW (ref 134–144)
eGFR: 58 mL/min/{1.73_m2} — ABNORMAL LOW (ref >59)

## 2022-12-18 MED ORDER — SPIRONOLACTONE 25 MG PO TABS: 12.5000 mg | ORAL_TABLET | Freq: Every day | ORAL | 3 refills | Status: DC

## 2022-12-18 NOTE — Telephone Encounter (Signed)
Called spoke to [place]. I let her know there is not mention of the PT and OT in Dr. Kathalene Frames notes. She states the pt called her and told her "they wanted it but did not have a fax number." She states "I would have went they her PCP normally." Will get the message to Dr. Audie Box to determine if PT and OT will be ordered by this office or her PCP.

## 2022-12-18 NOTE — Telephone Encounter (Signed)
Caller stated they will need orders for patient's PT and OT rehab.  Orders can be faxed to fax# [phone].

## 2022-12-19 NOTE — Telephone Encounter (Signed)
Called to speak to [place]. Spoke to [place] instead, She was not sure if pt's PCP has been contacted for the PT/OT order. She will have Minna Merritts call me back to verify.

## 2022-12-19 NOTE — Telephone Encounter (Signed)
Caller returned RN's call and stated she will contact the patient's PCP to get PT/OT order but wants to confirm it will be OK with cardiologist for patient to have treatments.

## 2022-12-19 NOTE — Telephone Encounter (Signed)
Spoke with [name], she will place the order and have the provider on site sign it. The pt will be scheduled for PT/OT next Wednesday. No further questions or concerns expressed at this time.

## 2023-01-01 ENCOUNTER — Other Ambulatory Visit: Payer: Self-pay | Admitting: Cardiovascular Disease

## 2023-01-09 ENCOUNTER — Encounter: Payer: Self-pay | Admitting: Family Medicine

## 2023-01-09 ENCOUNTER — Non-Acute Institutional Stay (INDEPENDENT_AMBULATORY_CARE_PROVIDER_SITE_OTHER): Payer: Medicare Other | Admitting: Family Medicine

## 2023-01-09 VITALS — BP 122/80 | HR 74 | Temp 97.4°F | Resp 18 | Ht 59.0 in | Wt 123.0 lb

## 2023-01-09 DIAGNOSIS — E785 Hyperlipidemia, unspecified: Secondary | ICD-10-CM

## 2023-01-09 DIAGNOSIS — I482 Chronic atrial fibrillation, unspecified: Secondary | ICD-10-CM | POA: Diagnosis not present

## 2023-01-09 DIAGNOSIS — I10 Essential (primary) hypertension: Secondary | ICD-10-CM

## 2023-01-09 DIAGNOSIS — I5033 Acute on chronic diastolic (congestive) heart failure: Secondary | ICD-10-CM

## 2023-01-09 NOTE — Progress Notes (Signed)
Provider:  Alain Honey, MD  Careteam: Patient Care Team: [facility], [place] as PCP - General Megan Salon, MD (Gynecology) Eston Esters, MD (Inactive) (Hematology and Oncology) Laurence Spates, MD (Inactive) (Gastroenterology) Mast, Man X, NP as Nurse Practitioner (Internal Medicine)  PLACE OF SERVICE:  [place] Directive information    Allergies  Allergen Reactions   Aspirin Rash    Chief Complaint  Patient presents with   Follow-up    Three Month Follow up   Quality Metric Gaps    Needs to discuss Pneumonia, Covid, Tdap, Shingrix, Influenza vaccine and Medicare Annual Wellness Visit.      HPI: Patient is a 89 y.o. female here for medical management of chronic problems including congestive heart failure, hypertension, atherosclerosis of the aorta, chronic kidney disease, and hyperlipidemia.  Since her last visit here she has seen her cardiologist on several occasions and also been hospitalized with failure.  Currently she is taking torsemide 20 mg a day as well as spironolactone 12.5 mg.  There are no issues with the Eliquis.  Lipids have not been assessed and over 2 years so we have made plans to check lipids tomorrow. She denies shortness of breath orthopnea or PND.  As long she watches her salt intake and takes a diuretic there is no edema in the ankles  Review of Systems:  Review of Systems  Constitutional: Negative.   Respiratory: Negative.    Cardiovascular: Negative.   Genitourinary: Negative.   Musculoskeletal:  Positive for joint pain.  Neurological: Negative.   Psychiatric/Behavioral: Negative.    All other systems reviewed and are negative.   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   Right lower lobectomy   Diverticulitis 1999    Femur fracture ([place]) 03/2013   left, non weight bearing for 2 1/2 weeks   GERD (gastroesophageal reflux disease)    Hemorrhoids 1991   HTN (hypertension)    Lymphedema of upper extremity    right arm   Osteoporosis    PONV (postoperative nausea and vomiting)    Urinary, incontinence, stress female    Vitamin D deficiency disease    Past Surgical History:  Procedure Laterality Date   APPENDECTOMY     BREAST BIOPSY  05/04/1987   Right Dr Annamaria Boots   BREAST IMPLANT REMOVAL Right 06/05/2010   BUNIONECTOMY  10/15/1986   carcinoid Right    Right lower lobectomy 2017   COLONOSCOPY     COLONOSCOPY W/ POLYPECTOMY     EYE SURGERY  5/14 ; 6/14   Cataract Surgery   JOINT REPLACEMENT     2002 left; 2009 right Hip, rt knee 2012   KNEE ARTHROPLASTY Right 10/15/2010   [name]   LOBECTOMY Right 01/05/2016   Procedure: RIGHT LOWER LOBE LOBECTOMY;  Surgeon: Melrose Nakayama, MD;  Location: [place];  Service: Thoracic;  Laterality: Right;   MASTECTOMY PARTIAL / LUMPECTOMY W/ AXILLARY LYMPHADENECTOMY  04/05/1987   Right - Dr Annamaria Boots   RESECTION OF MEDIASTINAL MASS N/A 01/05/2016   Procedure: RESECTION OF PLEURAL MASS, right;  Surgeon: Melrose Nakayama, MD;  Location: [place];  Service: Thoracic;  Laterality: N/A;   [place]  Bilateral 10/15/1968   VAGINAL HYSTERECTOMY  10/16/1975   myomata   VIDEO ASSISTED THORACOSCOPY (VATS)/WEDGE RESECTION Right 01/05/2016   Procedure: RIGHT VIDEO ASSISTED THORACOSCOPY (VATS)/WEDGE RESECTION;  Surgeon: Melrose Nakayama, MD;  Location: [place];  Service: Thoracic;  Laterality: Right;   Social History:   reports that she has never smoked. She has never used smokeless tobacco. She reports that she does not drink alcohol and does not use drugs.  Family History  Problem Relation Age of Onset   Colon cancer Mother    Colon cancer Father    Heart disease Father    Stomach cancer Neg Hx    Liver cancer  Neg Hx    Esophageal cancer Neg Hx     Medications: Patient's Medications  New Prescriptions   No medications on file  Previous Medications   ACETAMINOPHEN (TYLENOL) 500 MG TABLET    Take 2 tablets (1,000 mg total) by mouth every 6 (six) hours as needed for mild pain.   APIXABAN (ELIQUIS) 2.5 MG TABS TABLET    TAKE ONE TABLET BY MOUTH TWICE DAILY   BISACODYL (DULCOLAX) 5 MG EC TABLET    Take 1 tablet (5 mg total) by mouth daily as needed for severe constipation.   CALCIUM CARBONATE-VITAMIN D 600-400 MG-UNIT TABLET    Take 1 tablet by mouth daily.   CARVEDILOL (COREG) 3.125 MG TABLET    Take 1 tablet (3.125 mg total) by mouth 2 (two) times daily with a meal.   ERGOCALCIFEROL (VITAMIN D2) 10 MCG (400 UNIT) TABS    Take 400 Units by mouth daily.   LACTOSE FREE NUTRITION (BOOST) LIQD    Take 237 mLs by mouth 2 (two) times daily between meals.   LOSARTAN (COZAAR) 25 MG TABLET    Take 1 tablet (25 mg total) by mouth daily.   OMEGA-3 FATTY ACIDS (FISH OIL) 1000 MG CAPS    Take 1 capsule by mouth 2 (two) times daily.   PANTOPRAZOLE (PROTONIX) 40 MG TABLET    Take 40 mg by mouth daily.   POLYETHYLENE GLYCOL (MIRALAX / GLYCOLAX) 17 G PACKET    Take 17 g by mouth daily as needed for moderate constipation.   PROBIOTIC PRODUCT (ALIGN PO)    Take by mouth.   PROPYLENE GLYCOL (SYSTANE COMPLETE) 0.6 % SOLN    Place 1 drop into both eyes daily.   ROSUVASTATIN (CRESTOR) 10 MG TABLET    TAKE ONE TABLET AT BEDTIME.   SIMETHICONE (GAS-X) 80 MG CHEWABLE TABLET    Chew 1 tablet (80 mg total) by mouth every 6 (six) hours as needed for flatulence.   SPIRONOLACTONE (ALDACTONE) 25 MG TABLET    Take 0.5 tablets (12.5 mg total) by mouth daily.   TORSEMIDE (DEMADEX) 20 MG TABLET    Take 1 tablet (20 mg total) by mouth daily.  Modified Medications   No medications on file  Discontinued Medications   No medications on file    Physical Exam:  There were no vitals filed for this visit. There is no height or weight  on file to calculate BMI. Wt Readings from Last 3 Encounters:  12/17/22 124 lb 12.8 oz (56.6 kg)  12/11/22 122 lb 12.8 oz (55.7 kg)  12/09/22 114 lb 13.8 oz (52.1 kg)    Physical Exam Vitals and nursing note reviewed.  Constitutional:      Appearance: Normal appearance.  HENT:     Mouth/Throat:     Mouth: Mucous membranes are moist.  Pharynx: Oropharynx is clear.  Eyes:     Extraocular Movements: Extraocular movements intact.     Pupils: Pupils are equal, round, and reactive to light.  Cardiovascular:     Rate and Rhythm: Normal rate and regular rhythm.  Pulmonary:     Effort: Pulmonary effort is normal.     Breath sounds: Normal breath sounds.  Abdominal:     General: Abdomen is flat. Bowel sounds are normal.     Palpations: Abdomen is soft.  Neurological:     General: No focal deficit present.     Mental Status: She is alert and oriented to person, place, and time.  Psychiatric:        Mood and Affect: Mood normal.        Behavior: Behavior normal.     Labs reviewed: Basic Metabolic Panel: Recent Labs    11/16/22 1019 12/05/22 1315 12/06/22 0045 12/07/22 0046 12/08/22 0043 12/09/22 0038 12/17/22 1346  NA  --  129*   < > 128* 131* 129* 133*  K  --  4.3   < > 3.9 3.4* 3.7 4.8  CL  --  94*   < > 91* 89* 89* 90*  CO2  --  25   < > 28 27 29 26   GLUCOSE  --  103*   < > 96 87 89 81  BUN  --  7*   < > 13 15 18 19   CREATININE  --  0.79   < > 0.92 1.08* 1.13* 0.96  CALCIUM  --  9.1   < > 8.6* 8.8* 8.5* 9.5  MG  --  2.1  --  2.0  --   --   --   PHOS  --   --   --   --  4.6 5.0*  --   TSH 1.450  --   --   --   --   --   --    < > = values in this interval not displayed.   Liver Function Tests: Recent Labs    01/29/22 1631 12/08/22 0043 12/09/22 0038  AST 36  --   --   ALT 14  --   --   ALKPHOS 65  --   --   BILITOT 1.6*  --   --   PROT 7.5  --   --   ALBUMIN 3.9 3.6 3.5   No results for input(s): "LIPASE", "AMYLASE" in the last 8760 hours. No results  for input(s): "AMMONIA" in the last 8760 hours. CBC: Recent Labs    01/29/22 1631 11/16/22 1019 12/05/22 1315 12/17/22 1346  WBC 4.9 4.0 4.3 4.4  NEUTROABS 3.6  --  3.6  --   HGB 12.4 11.9 11.4* 11.3  HCT 39.7 37.0 35.3* 34.4  MCV 103.4* 97 98.1 95  PLT 178 159 152 191   Lipid Panel: No results for input(s): "CHOL", "HDL", "LDLCALC", "TRIG", "CHOLHDL", "LDLDIRECT" in the last 8760 hours. TSH: Recent Labs    11/16/22 1019  TSH 1.450   A1C: No results found for: "HGBA1C"   Assessment/Plan  1. Atrial fibrillation, chronic ([place]) She is on metoprolol for rate control and Eliquis as anticoagulant.  Appears to be in sinus rhythm today  2. Acute on chronic diastolic congestive heart failure ([place]) Well compensated at this time.  Continue on torsemide as before and antihypertensives  3. Essential hypertension Continue with losartan and carvedilol  Alain Honey, MD [place] [phone]

## 2023-01-10 ENCOUNTER — Other Ambulatory Visit: Payer: Medicare Other

## 2023-01-15 ENCOUNTER — Other Ambulatory Visit: Payer: Medicare Other

## 2023-01-15 LAB — LIPID PANEL
Cholesterol: 146 mg/dL (ref <200)
HDL: 61 mg/dL (ref >=50)
LDL Cholesterol (Calc): 70 mg/dL (calc)
Non-HDL Cholesterol (Calc): 85 mg/dL (calc) (ref <130)
Total CHOL/HDL Ratio: 2.4 (calc) (ref <5.0)
Triglycerides: 73 mg/dL (ref <150)

## 2023-01-16 ENCOUNTER — Ambulatory Visit (INDEPENDENT_AMBULATORY_CARE_PROVIDER_SITE_OTHER): Payer: Medicare Other | Admitting: Family Medicine

## 2023-01-16 ENCOUNTER — Ambulatory Visit: Payer: Medicare Other | Admitting: Family

## 2023-01-16 VITALS — BP 122/68 | HR 80 | Temp 98.1°F | Resp 16 | Ht 59.0 in | Wt 122.6 lb

## 2023-01-16 DIAGNOSIS — L03113 Cellulitis of right upper limb: Secondary | ICD-10-CM | POA: Diagnosis not present

## 2023-01-16 MED ORDER — CEPHALEXIN 500 MG PO CAPS: 500.0000 mg | ORAL_CAPSULE | Freq: Two times a day (BID) | ORAL | 0 refills | Status: DC

## 2023-01-16 NOTE — Progress Notes (Signed)
Provider:  Alain Honey, MD  Careteam: Patient Care Team: Wardell Honour, MD as PCP - General (Family Medicine) Megan Salon, MD (Gynecology) Eston Esters, MD (Inactive) (Hematology and Oncology) Laurence Spates, MD (Inactive) (Gastroenterology) Mast, Man X, NP as Nurse Practitioner (Internal Medicine)  PLACE OF SERVICE:  [place] Directive information Does Patient Have a Medical Advance Directive?: Yes, Type of Advance Directive: Living will, Does patient want to make changes to medical advance directive?: No - Patient declined  Allergies  Allergen Reactions   Aspirin Rash    Chief Complaint  Patient presents with   Acute Visit    Patient complains of pain in arm.     HPI: Patient is a 89 y.o. female [name] presents today with swelling in her right arm and erythema and warmth.  She had mastectomy in the remote past and has some lymphedema but she has had this symptom complex previously and was treated as cellulitis.  There is no open area in the skin although she did have a bit of a problem with her index fingernail.  Review of Systems:  Review of Systems  Constitutional:  Negative for fever and malaise/fatigue.  HENT: Negative.    Respiratory: Negative.    Cardiovascular: Negative.   Genitourinary: Negative.   Skin:  Positive for rash.  Neurological: Negative.   Endo/Heme/Allergies: Negative.     Past Medical History:  Diagnosis Date   Arthritis 1996   Atrial fibrillation    on coumadin   Atrial tachycardia    Cancer 1988   breast cancer, R mastectomy, chemo   Carcinoid bronchial adenoma of right lung 01/05/2016   Right lower lobectomy   Diverticulitis 1999   Femur fracture 03/2013   left, non weight bearing for 2 1/2 weeks   GERD (gastroesophageal reflux disease)    Hemorrhoids 1991   HTN (hypertension)    Lymphedema of upper extremity    right arm   Osteoporosis    PONV (postoperative nausea and vomiting)    Urinary, incontinence,  stress female    Vitamin D deficiency disease    Past Surgical History:  Procedure Laterality Date   APPENDECTOMY     BREAST BIOPSY  05/04/1987   Right Dr Annamaria Boots   BREAST IMPLANT REMOVAL Right 06/05/2010   BUNIONECTOMY  10/15/1986   carcinoid Right    Right lower lobectomy 2017   COLONOSCOPY     COLONOSCOPY W/ POLYPECTOMY     EYE SURGERY  5/14 ; 6/14   Cataract Surgery   JOINT REPLACEMENT     2002 left; 2009 right Hip, rt knee 2012   KNEE ARTHROPLASTY Right 10/15/2010   [name]   LOBECTOMY Right 01/05/2016   Procedure: RIGHT LOWER LOBE LOBECTOMY;  Surgeon: Melrose Nakayama, MD;  Location: [facility];  Service: Thoracic;  Laterality: Right;   MASTECTOMY PARTIAL / LUMPECTOMY W/ AXILLARY LYMPHADENECTOMY  04/05/1987   Right - Dr Annamaria Boots   RESECTION OF MEDIASTINAL MASS N/A 01/05/2016   Procedure: RESECTION OF PLEURAL MASS, right;  Surgeon: Melrose Nakayama, MD;  Location: [facility];  Service: Thoracic;  Laterality: N/A;   TONSILLECTOMY     TOTAL HIP ARTHROPLASTY     TUBAL LIGATION Bilateral 10/15/1968   VAGINAL HYSTERECTOMY  10/16/1975   myomata   VIDEO ASSISTED THORACOSCOPY (VATS)/WEDGE RESECTION Right 01/05/2016   Procedure: RIGHT VIDEO ASSISTED THORACOSCOPY (VATS)/WEDGE RESECTION;  Surgeon: Melrose Nakayama, MD;  Location: [place] [facility];  Service: Thoracic;  Laterality: Right;  Social History:   reports that she has never smoked. She has never used smokeless tobacco. She reports that she does not drink alcohol and does not use drugs.  Family History  Problem Relation Age of Onset   Colon cancer Mother    Colon cancer Father    Heart disease Father    Stomach cancer Neg Hx    Liver cancer Neg Hx    Esophageal cancer Neg Hx     Medications: Patient's Medications  New Prescriptions   CEPHALEXIN (KEFLEX) 500 MG CAPSULE    Take 1 capsule (500 mg total) by mouth 2 (two) times daily.  Previous Medications   ACETAMINOPHEN (TYLENOL) 500 MG TABLET    Take 2 tablets (1,000 mg  total) by mouth every 6 (six) hours as needed for mild pain.   APIXABAN (ELIQUIS) 2.5 MG TABS TABLET    TAKE ONE TABLET BY MOUTH TWICE DAILY   BISACODYL (DULCOLAX) 5 MG EC TABLET    Take 1 tablet (5 mg total) by mouth daily as needed for severe constipation.   CALCIUM CARBONATE-VITAMIN D 600-400 MG-UNIT TABLET    Take 1 tablet by mouth daily.   CARVEDILOL (COREG) 3.125 MG TABLET    Take 1 tablet (3.125 mg total) by mouth 2 (two) times daily with a meal.   ERGOCALCIFEROL (VITAMIN D2) 10 MCG (400 UNIT) TABS    Take 400 Units by mouth daily.   LACTOSE FREE NUTRITION (BOOST) LIQD    Take 237 mLs by mouth 2 (two) times daily between meals.   LOSARTAN (COZAAR) 25 MG TABLET    Take 1 tablet (25 mg total) by mouth daily.   OMEGA-3 FATTY ACIDS (FISH OIL) 1000 MG CAPS    Take 1 capsule by mouth 2 (two) times daily.   PANTOPRAZOLE (PROTONIX) 40 MG TABLET    Take 40 mg by mouth daily.   POLYETHYLENE GLYCOL (MIRALAX / GLYCOLAX) 17 G PACKET    Take 17 g by mouth daily as needed for moderate constipation.   PROBIOTIC PRODUCT (ALIGN PO)    Take by mouth.   PROPYLENE GLYCOL (SYSTANE COMPLETE) 0.6 % SOLN    Place 1 drop into both eyes daily.   ROSUVASTATIN (CRESTOR) 10 MG TABLET    TAKE ONE TABLET AT BEDTIME.   SIMETHICONE (GAS-X) 80 MG CHEWABLE TABLET    Chew 1 tablet (80 mg total) by mouth every 6 (six) hours as needed for flatulence.   SPIRONOLACTONE (ALDACTONE) 25 MG TABLET    Take 0.5 tablets (12.5 mg total) by mouth daily.   TORSEMIDE (DEMADEX) 20 MG TABLET    Take 1 tablet (20 mg total) by mouth daily.  Modified Medications   No medications on file  Discontinued Medications   No medications on file    Physical Exam:  Vitals:   01/16/23 1334  BP: 122/68  Pulse: 80  Resp: 16  Temp: 98.1 F (36.7 C)  SpO2: 91%  Weight: 122 lb 9.6 oz (55.6 kg)  Height: 4\' 11"  (1.499 m)   Body mass index is 24.76 kg/m. Wt Readings from Last 3 Encounters:  01/16/23 122 lb 9.6 oz (55.6 kg)  01/09/23 123 lb  (55.8 kg)  12/17/22 124 lb 12.8 oz (56.6 kg)    Physical Exam Vitals and nursing note reviewed.  Constitutional:      Appearance: Normal appearance.  Skin:    Comments: There is some soft tissue swelling in the right arm as well as erythema and increased temperature.  This appears  to be cellulitis.  I do not think this is DVT as the swelling is soft as opposed to firm.  Neurological:     Mental Status: She is alert.     Labs reviewed: Basic Metabolic Panel: Recent Labs    11/16/22 1019 12/05/22 1315 12/06/22 0045 12/07/22 0046 12/08/22 0043 12/09/22 0038 12/17/22 1346  NA  --  129*   < > 128* 131* 129* 133*  K  --  4.3   < > 3.9 3.4* 3.7 4.8  CL  --  94*   < > 91* 89* 89* 90*  CO2  --  25   < > 28 27 29 26   GLUCOSE  --  103*   < > 96 87 89 81  BUN  --  7*   < > 13 15 18 19   CREATININE  --  0.79   < > 0.92 1.08* 1.13* 0.96  CALCIUM  --  9.1   < > 8.6* 8.8* 8.5* 9.5  MG  --  2.1  --  2.0  --   --   --   PHOS  --   --   --   --  4.6 5.0*  --   TSH 1.450  --   --   --   --   --   --    < > = values in this interval not displayed.   Liver Function Tests: Recent Labs    01/29/22 1631 12/08/22 0043 12/09/22 0038  AST 36  --   --   ALT 14  --   --   ALKPHOS 65  --   --   BILITOT 1.6*  --   --   PROT 7.5  --   --   ALBUMIN 3.9 3.6 3.5   No results for input(s): "LIPASE", "AMYLASE" in the last 8760 hours. No results for input(s): "AMMONIA" in the last 8760 hours. CBC: Recent Labs    01/29/22 1631 11/16/22 1019 12/05/22 1315 12/17/22 1346  WBC 4.9 4.0 4.3 4.4  NEUTROABS 3.6  --  3.6  --   HGB 12.4 11.9 11.4* 11.3  HCT 39.7 37.0 35.3* 34.4  MCV 103.4* 97 98.1 95  PLT 178 159 152 191   Lipid Panel: Recent Labs    01/15/23 0719  CHOL 146  HDL 61  LDLCALC 70  TRIG 73  CHOLHDL 2.4   TSH: Recent Labs    11/16/22 1019  TSH 1.450   A1C: No results found for: "HGBA1C"   Assessment/Plan 1. Cellulitis of right upper extremity Will treat with Keflex  500 mg twice daily and warm compresses.  If symptoms do not improve over the next 48 hours consider ultrasound to rule out DVT    Alain Honey, MD [place] [phone]

## 2023-01-16 NOTE — Progress Notes (Deleted)
Careteam: Patient Care Team: Wardell Honour, MD as PCP - General (Family Medicine) Megan Salon, MD (Gynecology) Eston Esters, MD (Inactive) (Hematology and Oncology) Laurence Spates, MD (Inactive) (Gastroenterology) Mast, Man X, NP as Nurse Practitioner (Internal Medicine)  Seen by: Windell Moulding, AGNP-C  PLACE OF SERVICE:  [place] Directive information Does Patient Have a Medical Advance Directive?: Yes, Type of Advance Directive: Living will, Does patient want to make changes to medical advance directive?: No - Patient declined  Allergies  Allergen Reactions   Aspirin Rash    Chief Complaint  Patient presents with   Acute Visit    Patient complains of pain in arm.     HPI: Patient is a 89 y.o. female ***  Review of Systems:  ROS***  Past Medical History:  Diagnosis Date   Arthritis 1996   Atrial fibrillation    on coumadin   Atrial tachycardia    Cancer 1988   breast cancer, R mastectomy, chemo   Carcinoid bronchial adenoma of right lung 01/05/2016   Right lower lobectomy   Diverticulitis 1999   Femur fracture 03/2013   left, non weight bearing for 2 1/2 weeks   GERD (gastroesophageal reflux disease)    Hemorrhoids 1991   HTN (hypertension)    Lymphedema of upper extremity    right arm   Osteoporosis    PONV (postoperative nausea and vomiting)    Urinary, incontinence, stress female    Vitamin D deficiency disease    Past Surgical History:  Procedure Laterality Date   APPENDECTOMY     BREAST BIOPSY  05/04/1987   Right Dr Annamaria Boots   BREAST IMPLANT REMOVAL Right 06/05/2010   BUNIONECTOMY  10/15/1986   carcinoid Right    Right lower lobectomy 2017   COLONOSCOPY     COLONOSCOPY W/ POLYPECTOMY     EYE SURGERY  5/14 ; 6/14   Cataract Surgery   JOINT REPLACEMENT     2002 left; 2009 right Hip, rt knee 2012   KNEE ARTHROPLASTY Right 10/15/2010   [name]   LOBECTOMY Right 01/05/2016   Procedure: RIGHT LOWER LOBE LOBECTOMY;  Surgeon:  Melrose Nakayama, MD;  Location: [facility];  Service: Thoracic;  Laterality: Right;   MASTECTOMY PARTIAL / LUMPECTOMY W/ AXILLARY LYMPHADENECTOMY  04/05/1987   Right - Dr Annamaria Boots   RESECTION OF MEDIASTINAL MASS N/A 01/05/2016   Procedure: RESECTION OF PLEURAL MASS, right;  Surgeon: Melrose Nakayama, MD;  Location: [facility];  Service: Thoracic;  Laterality: N/A;   TONSILLECTOMY     TOTAL HIP ARTHROPLASTY     TUBAL LIGATION Bilateral 10/15/1968   VAGINAL HYSTERECTOMY  10/16/1975   myomata   VIDEO ASSISTED THORACOSCOPY (VATS)/WEDGE RESECTION Right 01/05/2016   Procedure: RIGHT VIDEO ASSISTED THORACOSCOPY (VATS)/WEDGE RESECTION;  Surgeon: Melrose Nakayama, MD;  Location: [facility];  Service: Thoracic;  Laterality: Right;   Social History:   reports that she has never smoked. She has never used smokeless tobacco. She reports that she does not drink alcohol and does not use drugs.  Family History  Problem Relation Age of Onset   Colon cancer Mother    Colon cancer Father    Heart disease Father    Stomach cancer Neg Hx    Liver cancer Neg Hx    Esophageal cancer Neg Hx     Medications: Patient's Medications  New Prescriptions   No medications on file  Previous Medications   ACETAMINOPHEN (TYLENOL) 500 MG TABLET  Take 2 tablets (1,000 mg total) by mouth every 6 (six) hours as needed for mild pain.   APIXABAN (ELIQUIS) 2.5 MG TABS TABLET    TAKE ONE TABLET BY MOUTH TWICE DAILY   BISACODYL (DULCOLAX) 5 MG EC TABLET    Take 1 tablet (5 mg total) by mouth daily as needed for severe constipation.   CALCIUM CARBONATE-VITAMIN D 600-400 MG-UNIT TABLET    Take 1 tablet by mouth daily.   CARVEDILOL (COREG) 3.125 MG TABLET    Take 1 tablet (3.125 mg total) by mouth 2 (two) times daily with a meal.   ERGOCALCIFEROL (VITAMIN D2) 10 MCG (400 UNIT) TABS    Take 400 Units by mouth daily.   LACTOSE FREE NUTRITION (BOOST) LIQD    Take 237 mLs by mouth 2 (two) times daily between meals.   LOSARTAN  (COZAAR) 25 MG TABLET    Take 1 tablet (25 mg total) by mouth daily.   OMEGA-3 FATTY ACIDS (FISH OIL) 1000 MG CAPS    Take 1 capsule by mouth 2 (two) times daily.   PANTOPRAZOLE (PROTONIX) 40 MG TABLET    Take 40 mg by mouth daily.   POLYETHYLENE GLYCOL (MIRALAX / GLYCOLAX) 17 G PACKET    Take 17 g by mouth daily as needed for moderate constipation.   PROBIOTIC PRODUCT (ALIGN PO)    Take by mouth.   PROPYLENE GLYCOL (SYSTANE COMPLETE) 0.6 % SOLN    Place 1 drop into both eyes daily.   ROSUVASTATIN (CRESTOR) 10 MG TABLET    TAKE ONE TABLET AT BEDTIME.   SIMETHICONE (GAS-X) 80 MG CHEWABLE TABLET    Chew 1 tablet (80 mg total) by mouth every 6 (six) hours as needed for flatulence.   SPIRONOLACTONE (ALDACTONE) 25 MG TABLET    Take 0.5 tablets (12.5 mg total) by mouth daily.   TORSEMIDE (DEMADEX) 20 MG TABLET    Take 1 tablet (20 mg total) by mouth daily.  Modified Medications   No medications on file  Discontinued Medications   No medications on file    Physical Exam:  Vitals:   01/16/23 1334  BP: 122/68  Pulse: 80  Resp: 16  Temp: 98.1 F (36.7 C)  SpO2: 91%  Weight: 122 lb 9.6 oz (55.6 kg)  Height: 4\' 11"  (1.499 m)   Body mass index is 24.76 kg/m. Wt Readings from Last 3 Encounters:  01/16/23 122 lb 9.6 oz (55.6 kg)  01/09/23 123 lb (55.8 kg)  12/17/22 124 lb 12.8 oz (56.6 kg)    Physical Exam***  Labs reviewed: Basic Metabolic Panel: Recent Labs    11/16/22 1019 12/05/22 1315 12/06/22 0045 12/07/22 0046 12/08/22 0043 12/09/22 0038 12/17/22 1346  NA  --  129*   < > 128* 131* 129* 133*  K  --  4.3   < > 3.9 3.4* 3.7 4.8  CL  --  94*   < > 91* 89* 89* 90*  CO2  --  25   < > 28 27 29 26   GLUCOSE  --  103*   < > 96 87 89 81  BUN  --  7*   < > 13 15 18 19   CREATININE  --  0.79   < > 0.92 1.08* 1.13* 0.96  CALCIUM  --  9.1   < > 8.6* 8.8* 8.5* 9.5  MG  --  2.1  --  2.0  --   --   --   PHOS  --   --   --   --  4.6 5.0*  --   TSH 1.450  --   --   --   --   --   --     < > = values in this interval not displayed.   Liver Function Tests: Recent Labs    01/29/22 1631 12/08/22 0043 12/09/22 0038  AST 36  --   --   ALT 14  --   --   ALKPHOS 65  --   --   BILITOT 1.6*  --   --   PROT 7.5  --   --   ALBUMIN 3.9 3.6 3.5   No results for input(s): "LIPASE", "AMYLASE" in the last 8760 hours. No results for input(s): "AMMONIA" in the last 8760 hours. CBC: Recent Labs    01/29/22 1631 11/16/22 1019 12/05/22 1315 12/17/22 1346  WBC 4.9 4.0 4.3 4.4  NEUTROABS 3.6  --  3.6  --   HGB 12.4 11.9 11.4* 11.3  HCT 39.7 37.0 35.3* 34.4  MCV 103.4* 97 98.1 95  PLT 178 159 152 191   Lipid Panel: Recent Labs    01/15/23 0719  CHOL 146  HDL 61  LDLCALC 70  TRIG 73  CHOLHDL 2.4   TSH: Recent Labs    11/16/22 1019  TSH 1.450   A1C: No results found for: "HGBA1C"   Assessment/Plan There are no diagnoses linked to this encounter.  Next appt: *** Herlinda Heady Harper, [facility] [phone]

## 2023-02-10 ENCOUNTER — Other Ambulatory Visit: Payer: Self-pay | Admitting: Cardiovascular Disease

## 2023-02-11 NOTE — Telephone Encounter (Signed)
Pt last saw Dr Flora Lipps 12/17/22, last labs 12/17/22, Creat 0.96, age 89, weight 55.6kg, based on specified criteria pt is on appropriate dosage of Eliquis 2.5mg  BID for afib.  Will refill rx.

## 2023-02-14 ENCOUNTER — Encounter: Payer: Self-pay | Admitting: Nurse Practitioner

## 2023-02-14 ENCOUNTER — Non-Acute Institutional Stay (INDEPENDENT_AMBULATORY_CARE_PROVIDER_SITE_OTHER): Payer: Medicare Other | Admitting: Nurse Practitioner

## 2023-02-14 VITALS — BP 118/72 | HR 66 | Temp 98.0°F | Ht 59.0 in | Wt 122.0 lb

## 2023-02-14 DIAGNOSIS — Z0000 Encounter for general adult medical examination without abnormal findings: Secondary | ICD-10-CM | POA: Diagnosis not present

## 2023-02-14 NOTE — Progress Notes (Signed)
Subjective:   [name] is a 89 y.o. female who presents for Medicare Annual (Subsequent) preventive examination in the Clinic @ [facility] [address].  Cardiac Risk Factors include: advanced age (>53men, >72 women)     Objective:    Today's Vitals   02/14/23 1356  BP: 118/72  Pulse: 66  Temp: 98 F (36.7 C)  TempSrc: Temporal  SpO2: 98%  Weight: 122 lb (55.3 kg)  Height: 4\' 11"  (1.499 m)   Body mass index is 24.64 kg/m.     02/14/2023    2:01 PM 01/16/2023    1:35 PM 12/11/2022    1:29 PM 12/05/2022   11:00 PM 05/10/2022   11:07 AM 01/29/2022    4:38 PM 10/19/2021   11:03 AM  Advanced Directives  Does Patient Have a Medical Advance Directive? Yes Yes Yes Yes Yes Yes Yes  Type of Advance Directive Living will Living will Living will Living will Living will Healthcare Power of Attorney;Living will;Out of facility DNR (pink MOST or yellow form) Healthcare Power of [place];Living will  Does patient want to make changes to medical advance directive? No - Patient declined No - Patient declined No - Patient declined No - Patient declined No - Patient declined  No - Patient declined  Copy of Healthcare Power of Attorney in Chart?      No - copy requested, Physician notified Yes - validated most recent copy scanned in chart (See row information)    Current Medications (verified) Outpatient Encounter Medications as of 02/14/2023  Medication Sig   acetaminophen (TYLENOL) 500 MG tablet Take 2 tablets (1,000 mg total) by mouth every 6 (six) hours as needed for mild pain.   bisacodyl (DULCOLAX) 5 MG EC tablet Take 1 tablet (5 mg total) by mouth daily as needed for severe constipation.   Calcium Carbonate-Vitamin D 600-400 MG-UNIT tablet Take 1 tablet by mouth daily.   carvedilol (COREG) 3.125 MG tablet Take 1 tablet (3.125 mg total) by mouth 2 (two) times daily with a meal.   ELIQUIS 2.5 MG TABS tablet TAKE ONE TABLET BY MOUTH TWICE DAILY   Ergocalciferol (VITAMIN D2) 10 MCG (400 UNIT)  TABS Take 400 Units by mouth daily.   lactose free nutrition (BOOST) LIQD Take 237 mLs by mouth 2 (two) times daily between meals.   losartan (COZAAR) 25 MG tablet Take 1 tablet (25 mg total) by mouth daily.   Omega-3 Fatty Acids (FISH OIL) 1000 MG CAPS Take 1 capsule by mouth 2 (two) times daily.   pantoprazole (PROTONIX) 40 MG tablet Take 40 mg by mouth daily.   polyethylene glycol (MIRALAX / GLYCOLAX) 17 g packet Take 17 g by mouth daily as needed for moderate constipation.   Probiotic Product (ALIGN PO) Take by mouth.   Propylene Glycol (SYSTANE COMPLETE) 0.6 % SOLN Place 1 drop into both eyes daily.   rosuvastatin (CRESTOR) 10 MG tablet TAKE ONE TABLET AT BEDTIME.   simethicone (GAS-X) 80 MG chewable tablet Chew 1 tablet (80 mg total) by mouth every 6 (six) hours as needed for flatulence.   spironolactone (ALDACTONE) 25 MG tablet Take 0.5 tablets (12.5 mg total) by mouth daily.   torsemide (DEMADEX) 20 MG tablet Take 1 tablet (20 mg total) by mouth daily.   [DISCONTINUED] cephALEXin (KEFLEX) 500 MG capsule Take 1 capsule (500 mg total) by mouth 2 (two) times daily.   No facility-administered encounter medications on file as of 02/14/2023.    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   Right lower lobectomy   Diverticulitis 1999   Femur fracture (HCC) 03/2013   left, non weight bearing for 2 1/2 weeks   GERD (gastroesophageal reflux disease)    Hemorrhoids 1991   HTN (hypertension)    Lymphedema of upper extremity    right arm   Osteoporosis    PONV (postoperative nausea and vomiting)    Urinary, incontinence, stress female    Vitamin D deficiency disease    Past Surgical History:  Procedure Laterality Date   APPENDECTOMY     BREAST  BIOPSY  05/04/1987   Right Dr Maple Hudson   BREAST IMPLANT REMOVAL Right 06/05/2010   BUNIONECTOMY  10/15/1986   carcinoid Right    Right lower lobectomy 2017   COLONOSCOPY     COLONOSCOPY W/ POLYPECTOMY     EYE SURGERY  5/14 ; 6/14   Cataract Surgery   JOINT REPLACEMENT     2002 left; 2009 right Hip, rt knee 2012   KNEE ARTHROPLASTY Right 10/15/2010   [facility]   LOBECTOMY Right 01/05/2016   Procedure: RIGHT LOWER LOBE LOBECTOMY;  Surgeon: Loreli Slot, MD;  Location: [facility] OR;  Service: Thoracic;  Laterality: Right;   MASTECTOMY PARTIAL / LUMPECTOMY W/ AXILLARY LYMPHADENECTOMY  04/05/1987   Right - Dr Maple Hudson   RESECTION OF MEDIASTINAL MASS N/A 01/05/2016   Procedure: RESECTION OF PLEURAL MASS, right;  Surgeon: Loreli Slot, MD;  Location: [facility] OR;  Service: Thoracic;  Laterality: N/A;   TONSILLECTOMY     TOTAL HIP ARTHROPLASTY     TUBAL LIGATION Bilateral 10/15/1968   VAGINAL HYSTERECTOMY  10/16/1975   myomata   VIDEO ASSISTED THORACOSCOPY (VATS)/WEDGE RESECTION Right 01/05/2016   Procedure: RIGHT VIDEO ASSISTED THORACOSCOPY (VATS)/WEDGE RESECTION;  Surgeon: Loreli Slot, MD;  Location: [facility] OR;  Service: Thoracic;  Laterality: Right;   Family History  Problem Relation Age of Onset   Colon cancer Mother    Colon cancer Father    Heart disease Father    Stomach cancer Neg Hx    Liver cancer Neg Hx    Esophageal cancer Neg Hx    Social History   Socioeconomic History   Marital status: Widowed    Spouse name: Not on file   Number of children: Not on file   Years of education: Not on file   Highest education level: Associate degree: occupational, Scientist, product/process development, or vocational program  Occupational History   Not on file  Tobacco Use   Smoking status: Never   Smokeless tobacco: Never  Vaping Use   Vaping Use: Never used  Substance and Sexual Activity   Alcohol use: No    Alcohol/week: 0.0 standard drinks of alcohol   Drug use: No   Sexual activity: Not  Currently    Partners: Male    Birth control/protection: Surgical    Comment: hysterectomy  Other Topics Concern   Not on file  Social History Narrative   Not on file   Social Determinants of Health   Financial Resource Strain: Low Risk  (01/16/2023)   Overall Financial Resource Strain (CARDIA)    Difficulty of Paying Living Expenses: Not hard at all  Food Insecurity: No Food Insecurity (01/16/2023)   Hunger Vital Sign  Worried About Programme researcher, broadcasting/film/video in the Last Year: Never true    Ran Out of Food in the Last Year: Never true  Transportation Needs: No Transportation Needs (01/16/2023)   PRAPARE - Administrator, Civil Service (Medical): No    Lack of Transportation (Non-Medical): No  Physical Activity: Sufficiently Active (01/16/2023)   Exercise Vital Sign    Days of Exercise per Week: 6 days    Minutes of Exercise per Session: 30 min  Stress: No Stress Concern Present (01/16/2023)   [employer] of Occupational Health - Occupational Stress Questionnaire    Feeling of Stress : Only a little  Social Connections: Socially Isolated (01/16/2023)   Social Connection and Isolation Panel [NHANES]    Frequency of Communication with Friends and Family: More than three times a week    Frequency of Social Gatherings with Friends and Family: More than three times a week    Attends Religious Services: Never    Database administrator or Organizations: No    Attends Banker Meetings: Not on file    Marital Status: Widowed    Tobacco Counseling Counseling given: Not Answered   Clinical Intake:  Pre-visit preparation completed: Yes  Pain : No/denies pain     BMI - recorded: 24.64 Nutritional Status: BMI of 19-24  Normal Nutritional Risks: None Diabetes: No  How often do you need to have someone help you when you read instructions, pamphlets, or other written materials from your doctor or pharmacy?: 1 - Never What is the last grade level you completed in  school?: college courses.  Diabetic?no  Interpreter Needed?: No  Information entered by :: Laurieann Friddle Nedra Hai NP   Activities of Daily Living    02/14/2023    2:50 PM 12/06/2022   12:00 AM  In your present state of health, do you have any difficulty performing the following activities:  Hearing? 1 1  Comment hearing aids   Vision? 0 1  Difficulty concentrating or making decisions? 0 1  Walking or climbing stairs? 1 1  Dressing or bathing? 0 0  Doing errands, shopping? 0   Preparing Food and eating ? N   Using the Toilet? N   In the past six months, have you accidently leaked urine? Y   Do you have problems with loss of bowel control? N   Managing your Medications? N   Managing your Finances? N   Housekeeping or managing your Housekeeping? N     Patient Care Team: Frederica Kuster, MD as PCP - General (Family Medicine) Jerene Bears, MD (Gynecology) Pierce Crane, MD (Inactive) (Hematology and Oncology) Carman Ching, MD (Inactive) (Gastroenterology) Marieelena Bartko X, NP as Nurse Practitioner (Internal Medicine)  Indicate any recent Medical Services you may have received from other than [facility] providers in the past year (date may be approximate).     Assessment:   This is a routine wellness examination for Brendy.  Hearing/Vision screen Hearing Screening - Comments:: Wears hearing aids and they are working fine.  Vision Screening - Comments:: Last eye exam less than 12 months ago. Next eye exam in June 2024, Dr.Hecker   Dietary issues and exercise activities discussed: Current Exercise Habits: The patient does not participate in regular exercise at present (PT 2x/week, walking to/back from dinning room), Exercise limited by: orthopedic condition(s);cardiac condition(s)   Goals Addressed             This Visit's Progress    Maintain Mobility  and Function       Evidence-based guidance:  Emphasize the importance of physical activity and aerobic exercise as included in  treatment plan; assess barriers to adherence; consider patient's abilities and preferences.  Encourage gradual increase in activity or exercise instead of stopping if pain occurs.  Reinforce individual therapy exercise prescription, such as strengthening, stabilization and stretching programs.  Promote optimal body mechanics to stabilize the spine with lifting and functional activity.  Encourage activity and mobility modifications to facilitate optimal function, such as using a log roll for bed mobility or dressing from a seated position.  Reinforce individual adaptive equipment recommendations to limit excessive spinal movements, such as a Event organiser.  Assess adequacy of sleep; encourage use of sleep hygiene techniques, such as bedtime routine; use of white noise; dark, cool bedroom; avoiding daytime naps, heavy meals or exercise before bedtime.  Promote positions and modification to optimize sleep and sexual activity; consider pillows or positioning devices to assist in maintaining neutral spine.  Explore options for applying ergonomic principles at work and home, such as frequent position changes, using ergonomically designed equipment and working at optimal height.  Promote modifications to increase comfort with driving such as lumbar support, optimizing seat and steering wheel position, using cruise control and taking frequent rest stops to stretch and walk.   Notes:        Depression Screen    02/14/2023    1:59 PM 10/19/2021    2:47 PM  PHQ 2/9 Scores  PHQ - 2 Score 0 0    Fall Risk    02/14/2023    1:59 PM 01/16/2023    1:35 PM 12/11/2022    1:29 PM 11/14/2022    2:24 PM 02/07/2022    3:04 PM  Fall Risk   Falls in the past year? 0 0 0 0 1  Number falls in past yr: 0 0 0 0 0  Injury with Fall? 0 0 0 0 0  Risk for fall due to : History of fall(s) No Fall Risks No Fall Risks No Fall Risks History of fall(s)  Follow up Falls evaluation completed Falls evaluation completed Falls  evaluation completed Falls evaluation completed Falls evaluation completed    FALL RISK PREVENTION PERTAINING TO THE HOME:  Any stairs in or around the home? Yes  If so, are there any without handrails? No  Home free of loose throw rugs in walkways, pet beds, electrical cords, etc? Yes  Adequate lighting in your home to reduce risk of falls? Yes   ASSISTIVE DEVICES UTILIZED TO PREVENT FALLS:  Life alert? No  Use of a cane, walker or w/c? Yes  Grab bars in the bathroom? Yes  Shower chair or bench in shower? No  Elevated toilet seat or a handicapped toilet? Yes   TIMED UP AND GO:  Was the test performed? Yes .  Length of time to ambulate 10 feet: 8 sec.   Gait slow and steady with assistive device  Cognitive Function:    02/14/2023    2:01 PM 10/26/2021    3:03 PM 10/26/2021    2:27 PM  MMSE - Mini Mental State Exam  Orientation to time 5 5 5   Orientation to Place 5 5 5   Registration 3 3 3   Attention/ Calculation 5 0 0  Recall 1 3 3   Language- name 2 objects 2 2 2   Language- repeat 1 1 1   Language- follow 3 step command 3 3 3   Language- read & follow direction 1  1 1  Write a sentence 1 1 1   Copy design 0 1 1  Total score 27 25 25         Immunizations Immunization History  Administered Date(s) Administered   Fluad Quad(high Dose 65+) 07/31/2021   Influenza, High Dose Seasonal PF 07/27/2020, 08/21/2022   Influenza-Unspecified 06/30/2016   Moderna Sars-Covid-2 Vaccination 10/19/2019, 11/16/2019   Pneumococcal Polysaccharide-23 12/16/2001   Pneumococcal-Unspecified 07/30/2008, 10/22/2014   Td 12/16/2001   Tdap 10/15/2009   Tetanus 05/09/2010   Zoster Recombinat (Shingrix) 04/24/2021    TDAP status: Up to date  Flu Vaccine status: Up to date  Pneumococcal vaccine status: Due, Education has been provided regarding the importance of this vaccine. Advised may receive this vaccine at local pharmacy or Health Dept. Aware to provide a copy of the vaccination record  if obtained from local pharmacy or Health Dept. Verbalized acceptance and understanding.  Covid-19 vaccine status: Information provided on how to obtain vaccines.   Qualifies for Shingles Vaccine? Yes   Zostavax completed Yes   Shingrix Completed?: No.    Education has been provided regarding the importance of this vaccine. Patient has been advised to call insurance company to determine out of pocket expense if they have not yet received this vaccine. Advised may also receive vaccine at local pharmacy or Health Dept. Verbalized acceptance and understanding.  Screening Tests Health Maintenance  Topic Date Due   Pneumonia Vaccine 75+ Years old (2 of 2 - PCV) 10/23/2015   Zoster Vaccines- Shingrix (2 of 2) 06/19/2021   COVID-19 Vaccine (3 - Moderna risk series) 08/27/2023 (Originally 12/14/2019)   INFLUENZA VACCINE  05/16/2023   Medicare Annual Wellness (AWV)  02/14/2024   DEXA SCAN  Completed   HPV VACCINES  Aged Out   DTaP/Tdap/Td  Discontinued    Health Maintenance  Health Maintenance Due  Topic Date Due   Pneumonia Vaccine 10+ Years old (2 of 2 - PCV) 10/23/2015   Zoster Vaccines- Shingrix (2 of 2) 06/19/2021    Colorectal cancer screening: No longer required.   Mammogram status: No longer required due to aged out.  Bone Density status: Completed 04/06/22. Results reflect: Bone density results: OSTEOPOROSIS. Repeat every 2 years.  Lung Cancer Screening: (Low Dose CT Chest recommended if Age 87-80 years, 30 pack-year currently smoking OR have quit w/in 15years.) does not qualify.     Additional Screening:  Hepatitis C Screening: does not qualify;   Vision Screening: Recommended annual ophthalmology exams for early detection of glaucoma and other disorders of the eye. Is the patient up to date with their annual eye exam?  Yes  Who is the provider or what is the name of the office in which the patient attends annual eye exams? Dr. Elmer Picker If pt is not established with a  provider, would they like to be referred to a provider to establish care? No .   Dental Screening: Recommended annual dental exams for proper oral hygiene  Community Resource Referral / Chronic Care Management: CRR required this visit?  No   CCM required this visit?  No      Plan:     I have personally reviewed and noted the following in the patient's chart:   Medical and social history Use of alcohol, tobacco or illicit drugs  Current medications and supplements including opioid prescriptions. Patient is not currently taking opioid prescriptions. Functional ability and status Nutritional status Physical activity Advanced directives List of other physicians Hospitalizations, surgeries, and ER visits in previous 12 months  Vitals Screenings to include cognitive, depression, and falls Referrals and appointments  In addition, I have reviewed and discussed with patient certain preventive protocols, quality metrics, and best practice recommendations. A written personalized care plan for preventive services as well as general preventive health recommendations were provided to patient.     Kentaro Alewine X Vitaliy Eisenhour, NP   02/14/2023

## 2023-03-26 NOTE — Progress Notes (Unsigned)
Cardiology Office Note:   Date:  03/28/2023  NAME:  [name]    MRN: [id number] DOB:  09/05/1934   PCP:  Frederica Kuster, MD  Cardiologist:  None  Electrophysiologist:  None   Referring MD: Mast, Man X, NP   Chief Complaint  Patient presents with   Follow-up   History of Present Illness:   [name] is a 89 y.o. female with a hx of HFpEF, HTN, permanent afib who presents for follow-up.  She reports she is doing well.  Blood pressure is well-controlled.  Denies any symptoms of heart failure.  She was working with physical therapy.  She reports she can still get a bit short of breath but symptoms are much improved with physical therapy.  Weights are stable.  No signs of volume overload.  No bleeding on Eliquis.  Denies any symptoms in office today.  Problem List 1. Permanent Afib -CHADSVASC=4 2. Carcinoid lung tumor  3. Cirrhosis  -on CT A/P 2020 4. Hyponatremia -SIADH? 5. HTN 6. HFpEF  -EF 60-65% 7. HLD -T chol 146, HDL 61, LDL 70, TG 73 7. 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   Right lower lobectomy   Diverticulitis 1999   Femur fracture (HCC) 03/2013   left, non weight bearing for 2 1/2 weeks   GERD (gastroesophageal reflux disease)    Hemorrhoids 1991   HTN (hypertension)    Lymphedema of upper extremity    right arm   Osteoporosis    PONV (postoperative nausea and vomiting)    Urinary, incontinence, stress female    Vitamin D deficiency disease     Past Surgical History: Past Surgical History:  Procedure Laterality Date   APPENDECTOMY     BREAST BIOPSY  05/04/1987   Right Dr Maple Hudson   BREAST IMPLANT REMOVAL Right 06/05/2010   BUNIONECTOMY  10/15/1986   carcinoid Right    Right lower  lobectomy 2017   COLONOSCOPY     COLONOSCOPY W/ POLYPECTOMY     EYE SURGERY  5/14 ; 6/14   Cataract Surgery   JOINT REPLACEMENT     2002 left; 2009 right Hip, rt knee 2012   KNEE ARTHROPLASTY Right 10/15/2010   Gerri Spore Long   LOBECTOMY Right 01/05/2016   Procedure: RIGHT LOWER LOBE LOBECTOMY;  Surgeon: Loreli Slot, MD;  Location: [facility] OR;  Service: Thoracic;  Laterality: Right;   MASTECTOMY PARTIAL / LUMPECTOMY W/ AXILLARY LYMPHADENECTOMY  04/05/1987   Right - Dr Maple Hudson   RESECTION OF MEDIASTINAL MASS N/A 01/05/2016   Procedure: RESECTION OF PLEURAL MASS, right;  Surgeon: Loreli Slot, MD;  Location: [facility] OR;  Service: Thoracic;  Laterality: N/A;   TONSILLECTOMY     TOTAL HIP ARTHROPLASTY     TUBAL LIGATION Bilateral 10/15/1968   VAGINAL HYSTERECTOMY  10/16/1975   myomata   VIDEO ASSISTED THORACOSCOPY (VATS)/WEDGE RESECTION Right 01/05/2016   Procedure: RIGHT VIDEO ASSISTED THORACOSCOPY (VATS)/WEDGE RESECTION;  Surgeon: Loreli Slot, MD;  Location: [facility] OR;  Service: Thoracic;  Laterality: Right;    Current Medications: Current Meds  Medication Sig   acetaminophen (TYLENOL) 500 MG tablet Take 2 tablets (1,000 mg total) by mouth every 6 (six) hours as needed  for mild pain.   bisacodyl (DULCOLAX) 5 MG EC tablet Take 1 tablet (5 mg total) by mouth daily as needed for severe constipation.   Calcium Carbonate-Vitamin D 600-400 MG-UNIT tablet Take 1 tablet by mouth daily.   carvedilol (COREG) 3.125 MG tablet Take 1 tablet (3.125 mg total) by mouth 2 (two) times daily with a meal.   ELIQUIS 2.5 MG TABS tablet TAKE ONE TABLET BY MOUTH TWICE DAILY   Ergocalciferol (VITAMIN D2) 10 MCG (400 UNIT) TABS Take 400 Units by mouth daily.   lactose free nutrition (BOOST) LIQD Take 237 mLs by mouth 2 (two) times daily between meals.   losartan (COZAAR) 25 MG tablet Take 1 tablet (25 mg total) by mouth daily.   Omega-3 Fatty Acids (FISH OIL) 1000 MG CAPS Take 1 capsule by mouth daily.    pantoprazole (PROTONIX) 40 MG tablet Take 40 mg by mouth daily.   polyethylene glycol (MIRALAX / GLYCOLAX) 17 g packet Take 17 g by mouth daily as needed for moderate constipation.   Probiotic Product (ALIGN PO) Take by mouth.   Propylene Glycol (SYSTANE COMPLETE) 0.6 % SOLN Place 1 drop into both eyes daily.   rosuvastatin (CRESTOR) 10 MG tablet TAKE ONE TABLET AT BEDTIME.   simethicone (GAS-X) 80 MG chewable tablet Chew 1 tablet (80 mg total) by mouth every 6 (six) hours as needed for flatulence.   spironolactone (ALDACTONE) 25 MG tablet Take 0.5 tablets (12.5 mg total) by mouth daily.   torsemide (DEMADEX) 20 MG tablet Take 1 tablet (20 mg total) by mouth daily.     Allergies:    Aspirin   Social History: Social History   Socioeconomic History   Marital status: Widowed    Spouse name: Not on file   Number of children: Not on file   Years of education: Not on file   Highest education level: Associate degree: occupational, Scientist, product/process development, or vocational program  Occupational History   Not on file  Tobacco Use   Smoking status: Never   Smokeless tobacco: Never  Vaping Use   Vaping Use: Never used  Substance and Sexual Activity   Alcohol use: No    Alcohol/week: 0.0 standard drinks of alcohol   Drug use: No   Sexual activity: Not Currently    Partners: Male    Birth control/protection: Surgical    Comment: hysterectomy  Other Topics Concern   Not on file  Social History Narrative   Not on file   Social Determinants of Health   Financial Resource Strain: Low Risk  (01/16/2023)   Overall Financial Resource Strain (CARDIA)    Difficulty of Paying Living Expenses: Not hard at all  Food Insecurity: No Food Insecurity (01/16/2023)   Hunger Vital Sign    Worried About Running Out of Food in the Last Year: Never true    Ran Out of Food in the Last Year: Never true  Transportation Needs: No Transportation Needs (01/16/2023)   PRAPARE - Administrator, Civil Service  (Medical): No    Lack of Transportation (Non-Medical): No  Physical Activity: Sufficiently Active (01/16/2023)   Exercise Vital Sign    Days of Exercise per Week: 6 days    Minutes of Exercise per Session: 30 min  Stress: No Stress Concern Present (01/16/2023)   [employer] of Occupational Health - Occupational Stress Questionnaire    Feeling of Stress : Only a little  Social Connections: Socially Isolated (01/16/2023)   Social Connection and Isolation Panel [NHANES]  Frequency of Communication with Friends and Family: More than three times a week    Frequency of Social Gatherings with Friends and Family: More than three times a week    Attends Religious Services: Never    Database administrator or Organizations: No    Attends Banker Meetings: Not on file    Marital Status: Widowed     Family History: The patient's family history includes Colon cancer in her father and mother; Heart disease in her father. There is no history of Stomach cancer, Liver cancer, or Esophageal cancer.  ROS:   All other ROS reviewed and negative. Pertinent positives noted in the HPI.     EKGs/Labs/Other Studies Reviewed:   The following studies were personally reviewed by me today:  TTE 12/06/2022  1. Left ventricular ejection fraction, by estimation, is 55 to 60%. The  left ventricle has normal function. The left ventricle has no regional  wall motion abnormalities. There is mild left ventricular hypertrophy.  Left ventricular diastolic parameters  are indeterminate.   2. Right ventricular systolic function is mildly reduced. The right  ventricular size is mild-moderately enlarged. There is moderately elevated  pulmonary artery systolic pressure. The estimated right ventricular  systolic pressure is 55.2 mmHg.   3. Left atrial size was severely dilated.   4. Right atrial size was severely dilated.   5. The mitral valve is degenerative. Trivial mitral valve regurgitation.  No evidence  of mitral stenosis.   6. Tricuspid valve regurgitation is moderate.   7. The aortic valve is abnormal. There is mild calcification of the  aortic valve. There is mild thickening of the aortic valve. Aortic valve  regurgitation is trivial. Aortic valve sclerosis/calcification is present,  without any evidence of aortic  stenosis.   8. The inferior vena cava is dilated in size with <50% respiratory  variability, suggesting right atrial pressure of 15 mmHg.   9. Cannot exclude a small PFO.   Recent Labs: 11/16/2022: TSH 1.450 12/05/2022: B Natriuretic Peptide 382.7 12/07/2022: Magnesium 2.0 12/17/2022: BUN 19; Creatinine, Ser 0.96; Hemoglobin 11.3; Platelets 191; Potassium 4.8; Sodium 133   Recent Lipid Panel    Component Value Date/Time   CHOL 146 01/15/2023 0719   TRIG 73 01/15/2023 0719   HDL 61 01/15/2023 0719   CHOLHDL 2.4 01/15/2023 0719   VLDL 10 04/09/2007 0915   LDLCALC 70 01/15/2023 0719   LDLDIRECT 107.8 04/09/2007 0915    Physical Exam:   VS:  BP 130/70   Pulse 75   Ht 4\' 11"  (1.499 m)   Wt 125 lb 12.8 oz (57.1 kg)   LMP 10/16/1975 (Approximate)   SpO2 98%   BMI 25.41 kg/m    Wt Readings from Last 3 Encounters:  03/28/23 125 lb 12.8 oz (57.1 kg)  02/14/23 122 lb (55.3 kg)  01/16/23 122 lb 9.6 oz (55.6 kg)    General: Well nourished, well developed, in no acute distress Head: Atraumatic, normal size  Eyes: PEERLA, EOMI  Neck: Supple, no JVD Endocrine: No thryomegaly Cardiac: Normal S1, S2; irregular rhythm, no murmurs rubs or gallops Lungs: Clear to auscultation bilaterally, no wheezing, rhonchi or rales  Abd: Soft, nontender, no hepatomegaly  Ext: No edema, pulses 2+ Musculoskeletal: No deformities, BUE and BLE strength normal and equal Skin: Warm and dry, no rashes   Neuro: Alert and oriented to person, place, time, and situation, CNII-XII grossly intact, no focal deficits  Psych: Normal mood and affect   ASSESSMENT:  [name] is a 89 y.o. female  who presents for the following: 1. Chronic diastolic heart failure (HCC)   2. Essential hypertension   3. Permanent atrial fibrillation (HCC)   4. Acquired thrombophilia (HCC)     PLAN:   1. Chronic diastolic heart failure (HCC) -Euvolemic on examination.  She will continue with torsemide 20 mg daily.  She is also on Aldactone doing well with this.  2. Essential hypertension -BP stable on carvedilol 3.125 mg twice daily, losartan 25 mg daily, Aldactone 12.5 mg daily.  3. Permanent atrial fibrillation (HCC) 4. Acquired thrombophilia (HCC) -Permanent A-fib.  Rate controlled on carvedilol.  On Eliquis.  Dose is appropriate based on age and weight.      Disposition: Return in about 6 months (around 09/27/2023).  Medication Adjustments/Labs and Tests Ordered: Current medicines are reviewed at length with the patient today.  Concerns regarding medicines are outlined above.  No orders of the defined types were placed in this encounter.  No orders of the defined types were placed in this encounter.   Patient Instructions  Medication Instructions:  The current medical regimen is effective;  continue present plan and medications.  *If you need a refill on your cardiac medications before your next appointment, please call your pharmacy*   Follow-Up: At [facility], you and your health needs are our priority.  As part of our continuing mission to provide you with exceptional heart care, we have created designated Provider Care Teams.  These Care Teams include your primary Cardiologist (physician) and Advanced Practice Providers (APPs -  Physician Assistants and Nurse Practitioners) who all work together to provide you with the care you need, when you need it.  We recommend signing up for the patient portal called "MyChart".  Sign up information is provided on this After Visit Summary.  MyChart is used to connect with patients for Virtual Visits (Telemedicine).  Patients are able  to view lab/test results, encounter notes, upcoming appointments, etc.  Non-urgent messages can be sent to your provider as well.   To learn more about what you can do with MyChart, go to [email or web address].    Your next appointment:   6 month(s)  Provider:   Lennie Odor, MD     Time Spent with Patient: I have spent a total of 25 minutes with patient reviewing hospital notes, telemetry, EKGs, labs and examining the patient as well as establishing an assessment and plan that was discussed with the patient.  > 50% of time was spent in direct patient care.  Signed, Lenna Gilford. Flora Lipps, MD, [facility]  [facility]  [address], [address] [place], [place] [address] [phone]  03/28/2023 6:34 PM

## 2023-03-28 ENCOUNTER — Ambulatory Visit: Payer: Medicare Other | Attending: Cardiovascular Disease | Admitting: Cardiovascular Disease

## 2023-03-28 ENCOUNTER — Encounter: Payer: Self-pay | Admitting: Cardiovascular Disease

## 2023-03-28 VITALS — BP 130/70 | HR 75 | Ht 59.0 in | Wt 125.8 lb

## 2023-03-28 DIAGNOSIS — D6869 Other thrombophilia: Secondary | ICD-10-CM | POA: Diagnosis not present

## 2023-03-28 DIAGNOSIS — I4821 Permanent atrial fibrillation: Secondary | ICD-10-CM

## 2023-03-28 DIAGNOSIS — I10 Essential (primary) hypertension: Secondary | ICD-10-CM | POA: Diagnosis not present

## 2023-03-28 DIAGNOSIS — I5032 Chronic diastolic (congestive) heart failure: Secondary | ICD-10-CM | POA: Diagnosis not present

## 2023-03-28 NOTE — Patient Instructions (Signed)
Medication Instructions:  The current medical regimen is effective;  continue present plan and medications.  *If you need a refill on your cardiac medications before your next appointment, please call your pharmacy*   Follow-Up: At [facility], you and your health needs are our priority.  As part of our continuing mission to provide you with exceptional heart care, we have created designated Provider Care Teams.  These Care Teams include your primary Cardiologist (physician) and Advanced Practice Providers (APPs -  Physician Assistants and Nurse Practitioners) who all work together to provide you with the care you need, when you need it.  We recommend signing up for the patient portal called "MyChart".  Sign up information is provided on this After Visit Summary.  MyChart is used to connect with patients for Virtual Visits (Telemedicine).  Patients are able to view lab/test results, encounter notes, upcoming appointments, etc.  Non-urgent messages can be sent to your provider as well.   To learn more about what you can do with MyChart, go to [email or web address].    Your next appointment:   6 month(s)  Provider:   Camas O'Neal, MD   

## 2023-04-17 ENCOUNTER — Encounter: Payer: Self-pay | Admitting: Family Medicine

## 2023-04-17 ENCOUNTER — Non-Acute Institutional Stay (INDEPENDENT_AMBULATORY_CARE_PROVIDER_SITE_OTHER): Payer: Medicare Other | Admitting: Family Medicine

## 2023-04-17 VITALS — BP 118/80 | HR 74 | Temp 96.8°F | Ht 59.0 in | Wt 126.8 lb

## 2023-04-17 DIAGNOSIS — C7A090 Malignant carcinoid tumor of the bronchus and lung: Secondary | ICD-10-CM | POA: Diagnosis not present

## 2023-04-17 DIAGNOSIS — I5033 Acute on chronic diastolic (congestive) heart failure: Secondary | ICD-10-CM | POA: Diagnosis not present

## 2023-04-17 DIAGNOSIS — I10 Essential (primary) hypertension: Secondary | ICD-10-CM

## 2023-04-17 DIAGNOSIS — I482 Chronic atrial fibrillation, unspecified: Secondary | ICD-10-CM

## 2023-04-17 DIAGNOSIS — E785 Hyperlipidemia, unspecified: Secondary | ICD-10-CM

## 2023-04-17 NOTE — Patient Instructions (Signed)
Call the office at [phone] in next week to schedule a 6 month follow-up with Dr.Miller @ [facility]

## 2023-04-17 NOTE — Progress Notes (Signed)
Provider:  Jacalyn Lefevre, MD  Careteam: Patient Care Team: Frederica Kuster, MD as PCP - General (Family Medicine) Jerene Bears, MD (Gynecology) Pierce Crane, MD (Inactive) (Hematology and Oncology) Carman Ching, MD (Inactive) (Gastroenterology) Mast, Man X, NP as Nurse Practitioner (Internal Medicine)  PLACE OF SERVICE:  [facility]  Advanced Directive information    Allergies  Allergen Reactions   Aspirin Rash    Chief Complaint  Patient presents with   Medical Management of Chronic Issues    Patient presents today for a 3 month follow-up   Quality Metric Gaps    Pneumonia, zoster     HPI: Patient is a 89 y.o. female this is 39-month follow-up in the interim patient has not experienced any new symptoms.  Memory is good sleeping good appetite good weight stable there have been no falls she does have a history of COPD and uses oxygen at night.  Also has hyperlipidemia and uses rosuvastatin which has her LDL at goal of 70  Review of Systems:  Review of Systems  Constitutional: Negative.   HENT: Negative.    Respiratory: Negative.    Cardiovascular: Negative.   Genitourinary: Negative.   Musculoskeletal: Negative.   Skin: Negative.   Neurological: Negative.   Psychiatric/Behavioral: Negative.    All other systems reviewed and are negative.   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   Right lower lobectomy   Diverticulitis 1999   Femur fracture (HCC) 03/2013   left, non weight bearing for 2 1/2 weeks   GERD (gastroesophageal reflux disease)    Hemorrhoids 1991   HTN (hypertension)    Lymphedema of upper extremity    right arm   Osteoporosis    PONV (postoperative nausea and vomiting)    Urinary, incontinence, stress female    Vitamin D deficiency  disease    Past Surgical History:  Procedure Laterality Date   APPENDECTOMY     BREAST BIOPSY  05/04/1987   Right Dr Maple Hudson   BREAST IMPLANT REMOVAL Right 06/05/2010   BUNIONECTOMY  10/15/1986   carcinoid Right    Right lower lobectomy 2017   COLONOSCOPY     COLONOSCOPY W/ POLYPECTOMY     EYE SURGERY  5/14 ; 6/14   Cataract Surgery   JOINT REPLACEMENT     2002 left; 2009 right Hip, rt knee 2012   KNEE ARTHROPLASTY Right 10/15/2010   Gerri Spore Long   LOBECTOMY Right 01/05/2016   Procedure: RIGHT LOWER LOBE LOBECTOMY;  Surgeon: Loreli Slot, MD;  Location: [facility] OR;  Service: Thoracic;  Laterality: Right;   MASTECTOMY PARTIAL / LUMPECTOMY W/ AXILLARY LYMPHADENECTOMY  04/05/1987   Right - Dr Maple Hudson   RESECTION OF MEDIASTINAL MASS N/A 01/05/2016   Procedure: RESECTION OF PLEURAL MASS, right;  Surgeon: Loreli Slot, MD;  Location: [facility] OR;  Service: Thoracic;  Laterality: N/A;   TONSILLECTOMY     TOTAL HIP ARTHROPLASTY     TUBAL LIGATION Bilateral 10/15/1968   VAGINAL HYSTERECTOMY  10/16/1975   myomata   VIDEO ASSISTED THORACOSCOPY (VATS)/WEDGE RESECTION Right 01/05/2016   Procedure: RIGHT VIDEO ASSISTED THORACOSCOPY (VATS)/WEDGE RESECTION;  Surgeon: Loreli Slot, MD;  Location: [facility] OR;  Service: Thoracic;  Laterality: Right;   Social History:   reports  that she has never smoked. She has never used smokeless tobacco. She reports that she does not drink alcohol and does not use drugs.  Family History  Problem Relation Age of Onset   Colon cancer Mother    Colon cancer Father    Heart disease Father    Stomach cancer Neg Hx    Liver cancer Neg Hx    Esophageal cancer Neg Hx     Medications: Patient's Medications  New Prescriptions   No medications on file  Previous Medications   ACETAMINOPHEN (TYLENOL) 500 MG TABLET    Take 2 tablets (1,000 mg total) by mouth every 6 (six) hours as needed for mild pain.   BISACODYL (DULCOLAX) 5 MG EC TABLET    Take 1 tablet  (5 mg total) by mouth daily as needed for severe constipation.   CALCIUM CARBONATE-VITAMIN D 600-400 MG-UNIT TABLET    Take 1 tablet by mouth daily.   CARVEDILOL (COREG) 3.125 MG TABLET    Take 1 tablet (3.125 mg total) by mouth 2 (two) times daily with a meal.   ELIQUIS 2.5 MG TABS TABLET    TAKE ONE TABLET BY MOUTH TWICE DAILY   ERGOCALCIFEROL (VITAMIN D2) 10 MCG (400 UNIT) TABS    Take 400 Units by mouth daily.   LACTOSE FREE NUTRITION (BOOST) LIQD    Take 237 mLs by mouth 2 (two) times daily between meals.   LOSARTAN (COZAAR) 25 MG TABLET    Take 1 tablet (25 mg total) by mouth daily.   OMEGA-3 FATTY ACIDS (FISH OIL) 1000 MG CAPS    Take 1 capsule by mouth daily.   PANTOPRAZOLE (PROTONIX) 40 MG TABLET    Take 40 mg by mouth daily.   POLYETHYLENE GLYCOL (MIRALAX / GLYCOLAX) 17 G PACKET    Take 17 g by mouth daily as needed for moderate constipation.   PROBIOTIC PRODUCT (ALIGN PO)    Take by mouth.   PROPYLENE GLYCOL (SYSTANE COMPLETE) 0.6 % SOLN    Place 1 drop into both eyes daily.   ROSUVASTATIN (CRESTOR) 10 MG TABLET    TAKE ONE TABLET AT BEDTIME.   SIMETHICONE (GAS-X) 80 MG CHEWABLE TABLET    Chew 1 tablet (80 mg total) by mouth every 6 (six) hours as needed for flatulence.   SPIRONOLACTONE (ALDACTONE) 25 MG TABLET    Take 0.5 tablets (12.5 mg total) by mouth daily.   TORSEMIDE (DEMADEX) 20 MG TABLET    Take 1 tablet (20 mg total) by mouth daily.  Modified Medications   No medications on file  Discontinued Medications   No medications on file    Physical Exam:  Vitals:   04/17/23 1326  BP: 118/80  Pulse: 74  Temp: (!) 96.8 F (36 C)  SpO2: 98%  Weight: 126 lb 12.8 oz (57.5 kg)  Height: 4\' 11"  (1.499 m)   Body mass index is 25.61 kg/m. Wt Readings from Last 3 Encounters:  04/17/23 126 lb 12.8 oz (57.5 kg)  03/28/23 125 lb 12.8 oz (57.1 kg)  02/14/23 122 lb (55.3 kg)    Physical Exam Vitals and nursing note reviewed.  Constitutional:      Appearance: Normal  appearance.  HENT:     Mouth/Throat:     Pharynx: Oropharynx is clear.  Eyes:     Extraocular Movements: Extraocular movements intact.     Pupils: Pupils are equal, round, and reactive to light.  Cardiovascular:     Rate and Rhythm: Normal rate and regular rhythm.  Pulmonary:     Effort: Pulmonary effort is normal.     Breath sounds: Normal breath sounds.  Abdominal:     General: Bowel sounds are normal.     Palpations: Abdomen is soft.  Musculoskeletal:        General: Normal range of motion.  Neurological:     General: No focal deficit present.     Mental Status: She is alert and oriented to person, place, and time.     Labs reviewed: Basic Metabolic Panel: Recent Labs    11/16/22 1019 12/05/22 1315 12/06/22 0045 12/07/22 0046 12/08/22 0043 12/09/22 0038 12/17/22 1346  NA  --  129*   < > 128* 131* 129* 133*  K  --  4.3   < > 3.9 3.4* 3.7 4.8  CL  --  94*   < > 91* 89* 89* 90*  CO2  --  25   < > 28 27 29 26   GLUCOSE  --  103*   < > 96 87 89 81  BUN  --  7*   < > 13 15 18 19   CREATININE  --  0.79   < > 0.92 1.08* 1.13* 0.96  CALCIUM  --  9.1   < > 8.6* 8.8* 8.5* 9.5  MG  --  2.1  --  2.0  --   --   --   PHOS  --   --   --   --  4.6 5.0*  --   TSH 1.450  --   --   --   --   --   --    < > = values in this interval not displayed.   Liver Function Tests: Recent Labs    12/08/22 0043 12/09/22 0038  ALBUMIN 3.6 3.5   No results for input(s): "LIPASE", "AMYLASE" in the last 8760 hours. No results for input(s): "AMMONIA" in the last 8760 hours. CBC: Recent Labs    11/16/22 1019 12/05/22 1315 12/17/22 1346  WBC 4.0 4.3 4.4  NEUTROABS  --  3.6  --   HGB 11.9 11.4* 11.3  HCT 37.0 35.3* 34.4  MCV 97 98.1 95  PLT 159 152 191   Lipid Panel: Recent Labs    01/15/23 0719  CHOL 146  HDL 61  LDLCALC 70  TRIG 73  CHOLHDL 2.4   TSH: Recent Labs    11/16/22 1019  TSH 1.450   A1C: No results found for: "HGBA1C"   Assessment/Plan  1. Atrial  fibrillation, chronic (HCC) Seems to be in sinus rhythm today there may be a very short systolic murmur that seems new  2. 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 Acute on chronic diastolic congestive heart failure (HCC) Well compensated continues with Aldactone as well as beta-blocker and ACE inhibitor  4. Essential hypertension Blood pressure today 118/80 on losartan and carvedilol  5. Hyperlipidemia, unspecified hyperlipidemia type Last LDL is at goal from 2 months ago   Jacalyn Lefevre, MD [facility] [phone]

## 2023-04-30 ENCOUNTER — Other Ambulatory Visit: Payer: Self-pay | Admitting: Cardiovascular Disease

## 2023-05-27 ENCOUNTER — Ambulatory Visit (INDEPENDENT_AMBULATORY_CARE_PROVIDER_SITE_OTHER): Payer: Medicare Other | Admitting: Orthopedic Surgery

## 2023-05-27 ENCOUNTER — Encounter: Payer: Self-pay | Admitting: Orthopedic Surgery

## 2023-05-27 VITALS — BP 142/82 | HR 81 | Temp 97.6°F | Resp 16 | Ht 59.0 in | Wt 128.8 lb

## 2023-05-27 DIAGNOSIS — I5032 Chronic diastolic (congestive) heart failure: Secondary | ICD-10-CM

## 2023-05-27 DIAGNOSIS — L03113 Cellulitis of right upper limb: Secondary | ICD-10-CM | POA: Diagnosis not present

## 2023-05-27 MED ORDER — CEPHALEXIN 500 MG PO CAPS: 500.0000 mg | ORAL_CAPSULE | Freq: Two times a day (BID) | ORAL | 0 refills | Status: AC

## 2023-05-27 MED ORDER — TORSEMIDE 20 MG PO TABS: 20.0000 mg | ORAL_TABLET | Freq: Every day | ORAL | 0 refills | Status: DC

## 2023-05-27 NOTE — Patient Instructions (Signed)
Keflex antibiotic sent to [facility] city> if you arm is not better within 2 days please contact us> you will  need ultrasound to rule out blood clot  Please take EXTRA torsemide 20 mg to help with weight gain and shortness of breath> extra dose for 3 days> if not better please notify provider or cardiology

## 2023-05-27 NOTE — Progress Notes (Signed)
Careteam: Patient Care Team: Frederica Kuster, MD as PCP - General (Family Medicine) Jerene Bears, MD (Gynecology) Pierce Crane, MD (Inactive) (Hematology and Oncology) Carman Ching, MD (Inactive) (Gastroenterology) Mast, Man X, NP as Nurse Practitioner (Internal Medicine)  Seen by: Hazle Nordmann, AGNP-C  PLACE OF SERVICE:  [facility]  Advanced Directive information Does Patient Have a Medical Advance Directive?: Yes, Type of Advance Directive: Healthcare Power of [place];Living will;Out of facility DNR (pink MOST or yellow form), Does patient want to make changes to medical advance directive?: No - Patient declined  Allergies  Allergen Reactions   Aspirin Rash    Chief Complaint  Patient presents with   Acute Visit    Patient complains of possible cellulitis in right arm. Arm is red, swollen, hot, feels like pin needles. Symptoms started last night.      HPI: Patient is a 89 y.o. female seen today for right arm swelling.   H/o right sided mastectomy. She woke up this morning and right arm from elbow to wrist was swollen and hot to touch. She denies pain. She is taking Eliquis daily as prescribed. 01/2023 she had same symptoms. Dr. Hyacinth Meeker had prescribed her Keflex and symptoms resolved. Afebrile. Vitals stable.   She also c/o increased shortness of breath. H/o CHF, takes torsemide 20 mg daily. She is compliant with daily weights. Today's weight in office 128 lbs. Weight this morning at home 125 lbs. O2 sats 95%. She denies cough and ankle edema. She is compliant with low sodium diet.     Review of Systems:  Review of Systems  Constitutional:  Negative for fever and malaise/fatigue.  HENT:  Negative for congestion and sore throat.   Respiratory:  Positive for shortness of breath. Negative for cough, sputum production and wheezing.   Cardiovascular:  Positive for leg swelling. Negative for chest pain.  Gastrointestinal:  Negative for constipation.  Genitourinary:   Negative for dysuria.  Musculoskeletal:  Negative for falls and joint pain.       Left arm swelling  Neurological:  Negative for dizziness and weakness.  Psychiatric/Behavioral:  Negative for depression. The patient is not nervous/anxious.     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   Right lower lobectomy   Diverticulitis 1999   Femur fracture (HCC) 03/2013   left, non weight bearing for 2 1/2 weeks   GERD (gastroesophageal reflux disease)    Hemorrhoids 1991   HTN (hypertension)    Lymphedema of upper extremity    right arm   Osteoporosis    PONV (postoperative nausea and vomiting)    Urinary, incontinence, stress female    Vitamin D deficiency disease    Past Surgical History:  Procedure Laterality Date   APPENDECTOMY     BREAST BIOPSY  05/04/1987   Right Dr Maple Hudson   BREAST IMPLANT REMOVAL Right 06/05/2010   BUNIONECTOMY  10/15/1986   carcinoid Right    Right lower lobectomy 2017   COLONOSCOPY     COLONOSCOPY W/ POLYPECTOMY     EYE SURGERY  5/14 ; 6/14   Cataract Surgery   JOINT REPLACEMENT     2002 left; 2009 right Hip, rt knee 2012   KNEE ARTHROPLASTY Right 10/15/2010   [facility]   LOBECTOMY  Right 01/05/2016   Procedure: RIGHT LOWER LOBE LOBECTOMY;  Surgeon: Loreli Slot, MD;  Location: [facility] OR;  Service: Thoracic;  Laterality: Right;   MASTECTOMY PARTIAL / LUMPECTOMY W/ AXILLARY LYMPHADENECTOMY  04/05/1987   Right - Dr Maple Hudson   RESECTION OF MEDIASTINAL MASS N/A 01/05/2016   Procedure: RESECTION OF PLEURAL MASS, right;  Surgeon: Loreli Slot, MD;  Location: [facility] OR;  Service: Thoracic;  Laterality: N/A;   TONSILLECTOMY     TOTAL HIP ARTHROPLASTY     TUBAL LIGATION Bilateral 10/15/1968   VAGINAL HYSTERECTOMY  10/16/1975   myomata   VIDEO ASSISTED  THORACOSCOPY (VATS)/WEDGE RESECTION Right 01/05/2016   Procedure: RIGHT VIDEO ASSISTED THORACOSCOPY (VATS)/WEDGE RESECTION;  Surgeon: Loreli Slot, MD;  Location: [facility] OR;  Service: Thoracic;  Laterality: Right;   Social History:   reports that she has never smoked. She has never used smokeless tobacco. She reports that she does not drink alcohol and does not use drugs.  Family History  Problem Relation Age of Onset   Colon cancer Mother    Colon cancer Father    Heart disease Father    Stomach cancer Neg Hx    Liver cancer Neg Hx    Esophageal cancer Neg Hx     Medications: Patient's Medications  New Prescriptions   No medications on file  Previous Medications   ACETAMINOPHEN (TYLENOL) 500 MG TABLET    Take 2 tablets (1,000 mg total) by mouth every 6 (six) hours as needed for mild pain.   BISACODYL (DULCOLAX) 5 MG EC TABLET    Take 1 tablet (5 mg total) by mouth daily as needed for severe constipation.   CALCIUM CARBONATE-VITAMIN D 600-400 MG-UNIT TABLET    Take 1 tablet by mouth daily.   CARVEDILOL (COREG) 3.125 MG TABLET    Take 1 tablet (3.125 mg total) by mouth 2 (two) times daily with a meal.   ELIQUIS 2.5 MG TABS TABLET    TAKE ONE TABLET BY MOUTH TWICE DAILY   ERGOCALCIFEROL (VITAMIN D2) 10 MCG (400 UNIT) TABS    Take 400 Units by mouth daily.   LACTOSE FREE NUTRITION (BOOST) LIQD    Take 237 mLs by mouth 2 (two) times daily between meals.   LOSARTAN (COZAAR) 25 MG TABLET    Take 1 tablet (25 mg total) by mouth daily.   OMEGA-3 FATTY ACIDS (FISH OIL) 1000 MG CAPS    Take 1 capsule by mouth daily.   PANTOPRAZOLE (PROTONIX) 40 MG TABLET    Take 40 mg by mouth daily.   POLYETHYLENE GLYCOL (MIRALAX / GLYCOLAX) 17 G PACKET    Take 17 g by mouth daily as needed for moderate constipation.   PROBIOTIC PRODUCT (ALIGN PO)    Take by mouth.   PROPYLENE GLYCOL (SYSTANE COMPLETE) 0.6 % SOLN    Place 1 drop into both eyes daily.   ROSUVASTATIN (CRESTOR) 10 MG TABLET    TAKE ONE  TABLET AT BEDTIME.   SIMETHICONE (GAS-X) 80 MG CHEWABLE TABLET    Chew 1 tablet (80 mg total) by mouth every 6 (six) hours as needed for flatulence.   SPIRONOLACTONE (ALDACTONE) 25 MG TABLET    Take 0.5 tablet (12.5 mg total) by mouth daily.   TORSEMIDE (DEMADEX) 20 MG TABLET    Take 1 tablet (20 mg total) by mouth daily.  Modified Medications   No medications on file  Discontinued Medications   No medications on file    Physical Exam:  Vitals:  05/27/23 1432  BP: (!) 158/88  Pulse: 81  Resp: 16  Temp: 97.6 F (36.4 C)  SpO2: 95%  Weight: 128 lb 12.8 oz (58.4 kg)  Height: 4\' 11"  (1.499 m)   Body mass index is 26.01 kg/m. Wt Readings from Last 3 Encounters:  05/27/23 128 lb 12.8 oz (58.4 kg)  04/17/23 126 lb 12.8 oz (57.5 kg)  03/28/23 125 lb 12.8 oz (57.1 kg)    Physical Exam Vitals reviewed.  Constitutional:      General: She is not in acute distress. HENT:     Head: Normocephalic.  Eyes:     General:        Right eye: No discharge.        Left eye: No discharge.  Cardiovascular:     Rate and Rhythm: Normal rate and regular rhythm.     Pulses: Normal pulses.     Heart sounds: Normal heart sounds.  Pulmonary:     Effort: Pulmonary effort is normal. No respiratory distress.     Breath sounds: Examination of the right-middle field reveals rales. Examination of the left-middle field reveals rales. Rales present. No wheezing.  Musculoskeletal:     Cervical back: Neck supple.     Right lower leg: Edema present.     Left lower leg: Edema present.     Comments: Non pitting  Skin:    General: Skin is warm.     Capillary Refill: Capillary refill takes less than 2 seconds.     Comments: Right arm from elbow to wrist swollen and warm to touch, skin slightly red, no tenderness, FROM, no skin breakdown.   Neurological:     General: No focal deficit present.     Mental Status: She is alert and oriented to person, place, and time.  Psychiatric:        Mood and Affect:  Mood normal.     Labs reviewed: Basic Metabolic Panel: Recent Labs    11/16/22 1019 12/05/22 1315 12/06/22 0045 12/07/22 0046 12/08/22 0043 12/09/22 0038 12/17/22 1346  NA  --  129*   < > 128* 131* 129* 133*  K  --  4.3   < > 3.9 3.4* 3.7 4.8  CL  --  94*   < > 91* 89* 89* 90*  CO2  --  25   < > 28 27 29 26   GLUCOSE  --  103*   < > 96 87 89 81  BUN  --  7*   < > 13 15 18 19   CREATININE  --  0.79   < > 0.92 1.08* 1.13* 0.96  CALCIUM  --  9.1   < > 8.6* 8.8* 8.5* 9.5  MG  --  2.1  --  2.0  --   --   --   PHOS  --   --   --   --  4.6 5.0*  --   TSH 1.450  --   --   --   --   --   --    < > = values in this interval not displayed.   Liver Function Tests: Recent Labs    12/08/22 0043 12/09/22 0038  ALBUMIN 3.6 3.5   No results for input(s): "LIPASE", "AMYLASE" in the last 8760 hours. No results for input(s): "AMMONIA" in the last 8760 hours. CBC: Recent Labs    11/16/22 1019 12/05/22 1315 12/17/22 1346  WBC 4.0 4.3 4.4  NEUTROABS  --  3.6  --  HGB 11.9 11.4* 11.3  HCT 37.0 35.3* 34.4  MCV 97 98.1 95  PLT 159 152 191   Lipid Panel: Recent Labs    01/15/23 0719  CHOL 146  HDL 61  LDLCALC 70  TRIG 73  CHOLHDL 2.4   TSH: Recent Labs    11/16/22 1019  TSH 1.450   A1C: No results found for: "HGBA1C"   Assessment/Plan 1. Cellulitis of arm, right - 01/2023> same symptoms> resolved with Keflex - right arm swollen from elbow to wrist, hot to touch, skin slightly red  - no apparent injury - h/o right mastectomy - on low dose Eliquis> do not suspect DVT - cephALEXin (KEFLEX) 500 MG capsule; Take 1 capsule (500 mg total) by mouth 2 (two) times daily for 7 days.  Dispense: 14 capsule; Refill: 0  2. Chronic diastolic congestive heart failure (HCC) - increased sob, weight gain> ? 3 lbs  - will increase torsemide to 40 mg daily x 3 days - continue daily weights - if no improvement advised to contact PCP - torsemide (DEMADEX) 20 MG tablet; Take 1 tablet  (20 mg total) by mouth daily for 3 days. Take in addition to scheduled torsemide x 3 days.  Dispense: 3 tablet; Refill: 0  Total time: 32 minutes. Greater than 50% of total time spent doing patient education regarding skin infection and shortness of breath including symptom/medication management.     Next appt: Visit date not found   Scherry Ran  [facility] [phone]

## 2023-05-30 ENCOUNTER — Other Ambulatory Visit: Payer: Self-pay | Admitting: Nurse Practitioner

## 2023-05-30 DIAGNOSIS — K219 Gastro-esophageal reflux disease without esophagitis: Secondary | ICD-10-CM

## 2023-07-17 ENCOUNTER — Telehealth: Payer: Self-pay | Admitting: Cardiovascular Disease

## 2023-07-17 MED ORDER — TORSEMIDE 20 MG PO TABS: 20.0000 mg | ORAL_TABLET | Freq: Every day | ORAL | 3 refills | Status: DC

## 2023-07-17 NOTE — Telephone Encounter (Signed)
*  STAT* If patient is at the pharmacy, call can be transferred to refill team.   1. Which medications need to be refilled? (please list name of each medication and dose if known) torsemide (DEMADEX) 20 MG tablet (Expired)   2. Which pharmacy/location (including street and city if local pharmacy) is medication to be sent to?  [facility] [place], [place] - [address]    3. Do they need a 30 day or 90 day supply? 90

## 2023-07-17 NOTE — Telephone Encounter (Signed)
Pt's medication was sent to pt's pharmacy as requested. Confirmation received.  °

## 2023-07-31 ENCOUNTER — Encounter: Payer: Self-pay | Admitting: Sports Medicine

## 2023-07-31 ENCOUNTER — Ambulatory Visit (INDEPENDENT_AMBULATORY_CARE_PROVIDER_SITE_OTHER): Payer: Medicare Other | Admitting: Sports Medicine

## 2023-07-31 VITALS — BP 138/84 | HR 75 | Temp 96.6°F | Resp 16 | Ht 59.0 in | Wt 128.4 lb

## 2023-07-31 DIAGNOSIS — H6121 Impacted cerumen, right ear: Secondary | ICD-10-CM

## 2023-07-31 DIAGNOSIS — R0981 Nasal congestion: Secondary | ICD-10-CM

## 2023-07-31 MED ORDER — FLUTICASONE PROPIONATE 50 MCG/ACT NA SUSP: 2.0000 | Freq: Every day | NASAL | 6 refills | Status: DC

## 2023-07-31 MED ORDER — FEXOFENADINE HCL 180 MG PO TABS: 180.0000 mg | ORAL_TABLET | Freq: Every day | ORAL | 2 refills | Status: DC

## 2023-07-31 MED ORDER — CIPRO HC 0.2-1 % OT SUSP: 3.0000 [drp] | Freq: Two times a day (BID) | OTIC | 0 refills | Status: DC

## 2023-07-31 NOTE — Progress Notes (Signed)
Careteam: Patient Care Team: Venita Sheffield, MD as PCP - General (Internal Medicine) Jerene Bears, MD (Gynecology) Pierce Crane, MD (Inactive) (Hematology and Oncology) Carman Ching, MD (Inactive) (Gastroenterology) Mast, Man X, NP as Nurse Practitioner (Internal Medicine)  PLACE OF SERVICE:  [facility]  Advanced Directive information Does Patient Have a Medical Advance Directive?: Yes, Type of Advance Directive: Healthcare Power of [place];Living will, Does patient want to make changes to medical advance directive?: No - Patient declined  Allergies  Allergen Reactions   Aspirin Rash    Chief Complaint  Patient presents with   Acute Visit    Patient is requesting ears to be cleaned.      HPI: Patient is a 89 y.o. female is here for ear irrigation  Pt is a resident at [facility].  Accompanied by her daughter  Pt c/o nasal congestion and requesting ear irrigation  Pt c/o post nasal drip and intermittent cough  Denies sob, sore throat , abdominal pain, nausea, vomiting, dysuria, hematuria, bloody or dark stools. Pt c/o intermittent  Left knee pain and takes tylenol prn for pain  No recent falls Does not exercise   Review of Systems:  Review of Systems  Constitutional:  Negative for chills and fever.  HENT:  Positive for congestion and hearing loss. Negative for sinus pain and sore throat.   Eyes:  Negative for double vision.  Respiratory:  Negative for cough, sputum production and shortness of breath.   Cardiovascular:  Negative for chest pain, palpitations and leg swelling.  Gastrointestinal:  Negative for abdominal pain, heartburn and nausea.  Genitourinary:  Negative for dysuria, frequency and hematuria.  Musculoskeletal:  Negative for falls and myalgias.  Neurological:  Negative for dizziness, sensory change and focal weakness.    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   Right lower lobectomy   Diverticulitis 1999   Femur fracture (HCC) 03/2013   left, non weight bearing for 2 1/2 weeks   GERD (gastroesophageal reflux disease)    Hemorrhoids 1991   HTN (hypertension)    Lymphedema of upper extremity    right arm   Osteoporosis    PONV (postoperative nausea and vomiting)    Urinary, incontinence, stress female    Vitamin D deficiency disease    Past Surgical History:  Procedure Laterality Date   APPENDECTOMY     BREAST BIOPSY  05/04/1987   Right Dr Maple Hudson   BREAST IMPLANT REMOVAL Right 06/05/2010   BUNIONECTOMY  10/15/1986   carcinoid Right    Right lower lobectomy 2017   COLONOSCOPY     COLONOSCOPY W/ POLYPECTOMY     EYE SURGERY  5/14 ; 6/14   Cataract Surgery   JOINT REPLACEMENT     2002 left; 2009 right Hip, rt knee 2012   KNEE ARTHROPLASTY Right 10/15/2010   [facility]   LOBECTOMY Right 01/05/2016   Procedure: RIGHT LOWER LOBE LOBECTOMY;  Surgeon: Loreli Slot, MD;  Location: [facility] OR;  Service: Thoracic;  Laterality: Right;   MASTECTOMY PARTIAL / LUMPECTOMY W/ AXILLARY LYMPHADENECTOMY  04/05/1987   Right - Dr Maple Hudson   RESECTION OF MEDIASTINAL MASS N/A 01/05/2016   Procedure: RESECTION OF PLEURAL MASS, right;  Surgeon: Loreli Slot, MD;  Location: [facility] OR;  Service: Thoracic;  Laterality: N/A;   TONSILLECTOMY     TOTAL HIP ARTHROPLASTY     TUBAL LIGATION Bilateral 10/15/1968   VAGINAL HYSTERECTOMY  10/16/1975   myomata   VIDEO ASSISTED THORACOSCOPY (VATS)/WEDGE RESECTION Right 01/05/2016   Procedure: RIGHT VIDEO ASSISTED THORACOSCOPY (VATS)/WEDGE RESECTION;  Surgeon: Loreli Slot, MD;  Location: [facility] OR;  Service: Thoracic;  Laterality: Right;   Social History:   reports that she has never smoked. She has never used smokeless tobacco. She reports that she  does not drink alcohol and does not use drugs.  Family History  Problem Relation Age of Onset   Colon cancer Mother    Colon cancer Father    Heart disease Father    Stomach cancer Neg Hx    Liver cancer Neg Hx    Esophageal cancer Neg Hx     Medications: Patient's Medications  New Prescriptions   No medications on file  Previous Medications   ACETAMINOPHEN (TYLENOL) 500 MG TABLET    Take 2 tablets (1,000 mg total) by mouth every 6 (six) hours as needed for mild pain.   BISACODYL (DULCOLAX) 5 MG EC TABLET    Take 1 tablet (5 mg total) by mouth daily as needed for severe constipation.   CALCIUM CARBONATE-VITAMIN D 600-400 MG-UNIT TABLET    Take 1 tablet by mouth daily.   CARVEDILOL (COREG) 3.125 MG TABLET    Take 1 tablet (3.125 mg total) by mouth 2 (two) times daily with a meal.   ELIQUIS 2.5 MG TABS TABLET    TAKE ONE TABLET BY MOUTH TWICE DAILY   ERGOCALCIFEROL (VITAMIN D2) 10 MCG (400 UNIT) TABS    Take 400 Units by mouth daily.   LACTOSE FREE NUTRITION (BOOST) LIQD    Take 237 mLs by mouth 2 (two) times daily between meals.   LOSARTAN (COZAAR) 25 MG TABLET    Take 1 tablet (25 mg total) by mouth daily.   OMEGA-3 FATTY ACIDS (FISH OIL) 1000 MG CAPS    Take 1 capsule by mouth daily.   PANTOPRAZOLE (PROTONIX) 40 MG TABLET    TAKE ONE TABLET BY MOUTH DAILY   POLYETHYLENE GLYCOL (MIRALAX / GLYCOLAX) 17 G PACKET    Take 17 g by mouth daily as needed for moderate constipation.   PROBIOTIC PRODUCT (ALIGN PO)    Take by mouth.   PROPYLENE GLYCOL (SYSTANE COMPLETE) 0.6 % SOLN    Place 1 drop into both eyes daily.   ROSUVASTATIN (CRESTOR) 10 MG TABLET    TAKE ONE TABLET AT BEDTIME.   SIMETHICONE (GAS-X) 80 MG CHEWABLE TABLET    Chew 1 tablet (80 mg total) by mouth every 6 (six) hours as needed for flatulence.   SPIRONOLACTONE (ALDACTONE) 25 MG TABLET    Take 0.5 tablet (12.5 mg total) by mouth daily.   TORSEMIDE (DEMADEX) 20 MG TABLET    Take 1 tablet (20 mg total) by mouth daily.   Modified Medications   No medications on file  Discontinued Medications   No medications on file    Physical Exam:  Vitals:   07/31/23 1409  BP: 138/84  Pulse: 75  Resp: 16  Temp: (!) 96.6 F (35.9 C)  SpO2: 91%  Weight: 128 lb 6.4 oz (58.2 kg)  Height: 4\' 11"  (1.499 m)   Body mass index is 25.93 kg/m. Wt Readings from Last 3 Encounters:  07/31/23 128 lb 6.4 oz (58.2 kg)  05/27/23 128 lb 12.8 oz (58.4  kg)  04/17/23 126 lb 12.8 oz (57.5 kg)    Physical Exam Constitutional:      Appearance: Normal appearance.  HENT:     Head: Normocephalic and atraumatic.     Right Ear: There is impacted cerumen.  Cardiovascular:     Rate and Rhythm: Normal rate and regular rhythm.     Heart sounds: No murmur heard. Pulmonary:     Effort: Pulmonary effort is normal. No respiratory distress.     Breath sounds: Normal breath sounds. No wheezing.  Abdominal:     General: Bowel sounds are normal. There is no distension.     Tenderness: There is no abdominal tenderness. There is no guarding or rebound.  Musculoskeletal:        General: No swelling or tenderness.  Skin:    General: Skin is dry.  Neurological:     Mental Status: She is alert. Mental status is at baseline.     Sensory: No sensory deficit.     Motor: No weakness.     Labs reviewed: Basic Metabolic Panel: Recent Labs    11/16/22 1019 12/05/22 1315 12/06/22 0045 12/07/22 0046 12/08/22 0043 12/09/22 0038 12/17/22 1346  NA  --  129*   < > 128* 131* 129* 133*  K  --  4.3   < > 3.9 3.4* 3.7 4.8  CL  --  94*   < > 91* 89* 89* 90*  CO2  --  25   < > 28 27 29 26   GLUCOSE  --  103*   < > 96 87 89 81  BUN  --  7*   < > 13 15 18 19   CREATININE  --  0.79   < > 0.92 1.08* 1.13* 0.96  CALCIUM  --  9.1   < > 8.6* 8.8* 8.5* 9.5  MG  --  2.1  --  2.0  --   --   --   PHOS  --   --   --   --  4.6 5.0*  --   TSH 1.450  --   --   --   --   --   --    < > = values in this interval not displayed.   Liver Function  Tests: Recent Labs    12/08/22 0043 12/09/22 0038  ALBUMIN 3.6 3.5   No results for input(s): "LIPASE", "AMYLASE" in the last 8760 hours. No results for input(s): "AMMONIA" in the last 8760 hours. CBC: Recent Labs    11/16/22 1019 12/05/22 1315 12/17/22 1346  WBC 4.0 4.3 4.4  NEUTROABS  --  3.6  --   HGB 11.9 11.4* 11.3  HCT 37.0 35.3* 34.4  MCV 97 98.1 95  PLT 159 152 191   Lipid Panel: Recent Labs    01/15/23 0719  CHOL 146  HDL 61  LDLCALC 70  TRIG 73  CHOLHDL 2.4   TSH: Recent Labs    11/16/22 1019  TSH 1.450   A1C: No results found for: "HGBA1C"   Assessment/Plan  1. Nasal congestion  - fluticasone (FLONASE) 50 MCG/ACT nasal spray; Place 2 sprays into both nostrils daily.  Dispense: 16 g; Refill: 6 - fexofenadine (ALLEGRA ALLERGY) 180 MG tablet; Take 1 tablet (180 mg total) by mouth daily.  Dispense: 90 tablet; Refill: 2  2. Impacted cerumen of right ear Ear irrigation done Rt ear  Slight bleeding after ear irrigation  Will send cipro otic suspension to her pharmacy  Instructed patient  to monitor for ear pain, drainage , fevers    No follow-ups on file.: has follow up appt in January

## 2023-08-22 ENCOUNTER — Other Ambulatory Visit: Payer: Self-pay | Admitting: Cardiovascular Disease

## 2023-09-26 NOTE — Progress Notes (Signed)
Cardiology Office Note:  .   Date:  09/27/2023  ID:  [name], DOB 1934/04/05, MRN [id number] PCP: Venita Sheffield, MD  [facility] Providers Cardiologist:  Reatha Harps, MD { History of Present Illness: .   [name] is a 89 y.o. female with history of HFpEF, permanent Afib, cirrhosis, HTN who presents for follow-up.    History of Present Illness   [name], an 89 year old female with a history of persistent atrial fibrillation and hypertension, presents for a routine follow-up. She reports occasional feelings of weakness and shakiness, but does not believe these symptoms are heart-related. She also mentions dealing with the emotional stress of a close friend in hospice. She lives in an independent living facility and is generally doing well. She is careful about her salt intake and reports no significant swelling in her legs. Her weight has been stable, with a one-pound decrease since August. She is active within her living community, participating in group meals and hall meetings. She also maintains regular communication with her three children.          Problem List 1. Permanent Afib -CHADSVASC=4 2. Carcinoid lung tumor  3. Cirrhosis  -on CT A/P 2020 4. Hyponatremia -SIADH? 5. HTN 6. HFpEF  -EF 60-65% 7. HLD -T chol 146, HDL 61, LDL 70, TG 73 7. [facility] ([facility])     ROS: All other ROS reviewed and negative. Pertinent positives noted in the HPI.     Studies Reviewed: Marland Kitchen        TTE 12/06/2022  1. Left ventricular ejection fraction, by estimation, is 55 to 60%. The  left ventricle has normal function. The left ventricle has no regional  wall motion abnormalities. There is mild left ventricular hypertrophy.  Left ventricular diastolic parameters  are indeterminate.   2. Right ventricular systolic function is mildly reduced. The right  ventricular size is mild-moderately enlarged. There is moderately elevated  pulmonary artery  systolic pressure. The estimated right ventricular  systolic pressure is 55.2 mmHg.   3. Left atrial size was severely dilated.   4. Right atrial size was severely dilated.   5. The mitral valve is degenerative. Trivial mitral valve regurgitation.  No evidence of mitral stenosis.   6. Tricuspid valve regurgitation is moderate.   7. The aortic valve is abnormal. There is mild calcification of the  aortic valve. There is mild thickening of the aortic valve. Aortic valve  regurgitation is trivial. Aortic valve sclerosis/calcification is present,  without any evidence of aortic  stenosis.   8. The inferior vena cava is dilated in size with <50% respiratory  variability, suggesting right atrial pressure of 15 mmHg.   9. Cannot exclude a small PFO.  Physical Exam:   VS:  BP (!) 140/68   Pulse 74   Ht 4\' 11"  (1.499 m)   Wt 127 lb 12.8 oz (58 kg)   LMP 10/16/1975 (Approximate)   SpO2 94%   BMI 25.81 kg/m    Wt Readings from Last 3 Encounters:  09/27/23 127 lb 12.8 oz (58 kg)  07/31/23 128 lb 6.4 oz (58.2 kg)  05/27/23 128 lb 12.8 oz (58.4 kg)    GEN: Well nourished, well developed in no acute distress NECK: No JVD; No carotid bruits CARDIAC: irregular rhythm, no murmurs, rubs, gallops RESPIRATORY:  Clear to auscultation without rales, wheezing or rhonchi  ABDOMEN: Soft, non-tender, non-distended EXTREMITIES:  No edema; No deformity  ASSESSMENT AND PLAN: .   Assessment and  Plan    Atrial Fibrillation Stable with heart rate in the 70s. No signs of volume overload. CV exam normal. -Continue Eliquis 2.5mg  BID for anticoagulation. -Continue Carvedilol 3.125mg  BID for rate control.  Hypertension BP acceptable at 140/68. Given patient's age, not treating aggressively. -Continue Carvedilol 3.125mg  BID, Losartan 25mg  daily, and Aldactone 12.5mg  daily.  Heart Failure Stable and euvolemic on exam. Patient has reduced salt intake. -Continue Torsemide 20mg  daily and Aldactone 12.5mg   daily.  Hyperlipidemia Lipids at goal. -Continue Rosuvastatin (Crestor).  Medication Refills Carvedilol, Rosuvastatin, Eliquis, and Losartan need refills. -Send refills to [facility].  Follow-up Stable, will return in 1 year.              Follow-up: Return in about 1 year (around 09/26/2024).  Time Spent with Patient: I have spent a total of 35 minutes caring for this patient today face to face, ordering and reviewing labs/tests, reviewing prior records/medical history, examining the patient, establishing an assessment and plan, communicating results/findings to the patient/family, and documenting in the medical record.   Signed, Lenna Gilford. Flora Lipps, MD, [facility]  [facility]  [address], [address] [place], [place] [address] [phone]  3:12 PM

## 2023-09-27 ENCOUNTER — Ambulatory Visit: Payer: Medicare Other | Attending: Cardiovascular Disease | Admitting: Cardiovascular Disease

## 2023-09-27 ENCOUNTER — Encounter: Payer: Self-pay | Admitting: Cardiovascular Disease

## 2023-09-27 VITALS — BP 140/68 | HR 74 | Ht 59.0 in | Wt 127.8 lb

## 2023-09-27 DIAGNOSIS — I10 Essential (primary) hypertension: Secondary | ICD-10-CM | POA: Diagnosis not present

## 2023-09-27 DIAGNOSIS — I5032 Chronic diastolic (congestive) heart failure: Secondary | ICD-10-CM | POA: Diagnosis not present

## 2023-09-27 DIAGNOSIS — I4821 Permanent atrial fibrillation: Secondary | ICD-10-CM | POA: Diagnosis not present

## 2023-09-27 DIAGNOSIS — D6869 Other thrombophilia: Secondary | ICD-10-CM | POA: Diagnosis not present

## 2023-09-27 MED ORDER — LOSARTAN POTASSIUM 25 MG PO TABS: 25.0000 mg | ORAL_TABLET | Freq: Every day | ORAL | 3 refills | Status: DC

## 2023-09-27 MED ORDER — APIXABAN 2.5 MG PO TABS: 2.5000 mg | ORAL_TABLET | Freq: Two times a day (BID) | ORAL | 3 refills | Status: DC

## 2023-09-27 MED ORDER — CARVEDILOL 3.125 MG PO TABS: 3.1250 mg | ORAL_TABLET | Freq: Two times a day (BID) | ORAL | 3 refills | Status: DC

## 2023-09-27 MED ORDER — ROSUVASTATIN CALCIUM 10 MG PO TABS: 10.0000 mg | ORAL_TABLET | Freq: Every day | ORAL | 3 refills | Status: AC

## 2023-09-27 NOTE — Patient Instructions (Signed)
Medication Instructions:  Your physician recommends that you continue on your current medications as directed. Please refer to the Current Medication list given to you today.  *If you need a refill on your cardiac medications before your next appointment, please call your pharmacy*  Lab Work: None  Follow-Up: At [facility], you and your health needs are our priority.  As part of our continuing mission to provide you with exceptional heart care, we have created designated Provider Care Teams.  These Care Teams include your primary Cardiologist (physician) and Advanced Practice Providers (APPs -  Physician Assistants and Nurse Practitioners) who all work together to provide you with the care you need, when you need it.  Your next appointment:   12 month(s)  Provider:   Reatha Harps, MD

## 2023-10-17 ENCOUNTER — Non-Acute Institutional Stay: Payer: Medicare Other | Admitting: Nurse Practitioner

## 2023-10-17 ENCOUNTER — Encounter: Payer: Self-pay | Admitting: Nurse Practitioner

## 2023-10-17 VITALS — BP 136/72 | HR 75 | Temp 97.8°F | Resp 18 | Ht 59.0 in | Wt 128.8 lb

## 2023-10-17 DIAGNOSIS — J329 Chronic sinusitis, unspecified: Secondary | ICD-10-CM | POA: Diagnosis not present

## 2023-10-17 DIAGNOSIS — I10 Essential (primary) hypertension: Secondary | ICD-10-CM

## 2023-10-17 DIAGNOSIS — E785 Hyperlipidemia, unspecified: Secondary | ICD-10-CM

## 2023-10-17 DIAGNOSIS — N1832 Chronic kidney disease, stage 3b: Secondary | ICD-10-CM

## 2023-10-17 DIAGNOSIS — K5901 Slow transit constipation: Secondary | ICD-10-CM

## 2023-10-17 DIAGNOSIS — I5033 Acute on chronic diastolic (congestive) heart failure: Secondary | ICD-10-CM

## 2023-10-17 DIAGNOSIS — E871 Hypo-osmolality and hyponatremia: Secondary | ICD-10-CM

## 2023-10-17 DIAGNOSIS — I482 Chronic atrial fibrillation, unspecified: Secondary | ICD-10-CM

## 2023-10-17 DIAGNOSIS — C7A090 Malignant carcinoid tumor of the bronchus and lung: Secondary | ICD-10-CM

## 2023-10-17 DIAGNOSIS — K219 Gastro-esophageal reflux disease without esophagitis: Secondary | ICD-10-CM

## 2023-10-17 DIAGNOSIS — D500 Iron deficiency anemia secondary to blood loss (chronic): Secondary | ICD-10-CM

## 2023-10-17 MED ORDER — AMOXICILLIN-POT CLAVULANATE 875-125 MG PO TABS: 1.0000 | ORAL_TABLET | Freq: Two times a day (BID) | ORAL | 0 refills | Status: DC

## 2023-10-17 NOTE — Assessment & Plan Note (Signed)
 Blood pressure is controlled, takes Losartan, Coreg, Rosuvastatin. Bun/creat  33/1.25 05/31/21

## 2023-10-17 NOTE — Assessment & Plan Note (Signed)
 chronic edema BLE, near none,  takes Spirolactone, Torsemide, EF 60-65%, hospitalized 05/30/20-06/08/20 for acute on CHF,  . F/u cardiology,  on 2037ml/day fluid restriction.

## 2023-10-17 NOTE — Assessment & Plan Note (Signed)
 LDL 70 01/15/23, takes Rosuvastatin.

## 2023-10-17 NOTE — Assessment & Plan Note (Signed)
 stable, Hgb 11.3 12/17/22,  off Fe

## 2023-10-17 NOTE — Assessment & Plan Note (Signed)
 takes MiraLax, Bisacodyl.

## 2023-10-17 NOTE — Assessment & Plan Note (Signed)
 Stable,  takes Pantoprazole, bloated abd sometimes, takes Simethicone

## 2023-10-17 NOTE — Assessment & Plan Note (Addendum)
 sinus pressure, benign ear exam, no TMJ tenderness, no dental/gum pain/swelling.  Nasal congestion, on Allegra, Flonase CT maxillofacial Empirical antibiotics Augmentin 875mg  bid x 7days.  ENT  CBC/diff, CMP/eGFR

## 2023-10-17 NOTE — Assessment & Plan Note (Signed)
 Na 133 12/17/22, had Nephrology evaluation. 209ml/day fluid restriction.

## 2023-10-17 NOTE — Progress Notes (Signed)
 Location:   Clinic [facility]   Place of Service:  Clinic (12) Provider: Larwance Hark NP  Code Status: DNR Goals of Care:     10/17/2023    1:48 PM  Advanced Directives  Does Patient Have a Medical Advance Directive? Yes  Type of Estate Agent of [place];Living will  Does patient want to make changes to medical advance directive? No - Patient declined  Copy of Healthcare Power of Attorney in Chart? Yes - validated most recent copy scanned in chart (See row information)     Chief Complaint  Patient presents with   Acute Visit    Sinus pressure in face and jaws     HPI: Patient is a 89 y.o. female seen today sinus pressure,  Nasal congestion, on Allegra , Flonase   Hyponatremia, Na 133 12/17/22, had Nephrology evaluation. 2022ml/day fluid restriction.              HTN, takes Losartan , Coreg , Rosuvastatin . Bun/creat  33/1.25 05/31/21             Afib, takes Coreg , Eliquis , TSH 1.98 02/02/21             CHF, chronic edema BLE, near none,  takes Spirolactone, Torsemide , EF 60-65%, hospitalized 05/30/20-06/08/20 for acute on CHF,  . F/u cardiology,  on 2089ml/day fluid restriction.             CKD, Bun/creat 19/0.96 12/17/22             Constipation, takes MiraLax , Bisacodyl .              Atrophic vaginitis, takes Estradiol  vaginal tab             GERD takes Pantoprazole , bloated abd sometimes, takes Simethicone              Anemia, stable, Hgb 11.3 12/17/22,  off Fe             Hyperlipidemia, LDL 70 01/15/23, takes Rosuvastatin .              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 Small table lung nodules, no need for continued f/u CT scan.                     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   Right lower lobectomy   Diverticulitis 1999   Femur fracture (HCC) 03/2013   left, non weight bearing for 2 1/2 weeks   GERD (gastroesophageal reflux disease)    Hemorrhoids 1991   HTN (hypertension)    Lymphedema of upper extremity    right arm   Osteoporosis    PONV (postoperative nausea and vomiting)    Urinary, incontinence, stress female    Vitamin D  deficiency disease     Past Surgical History:  Procedure Laterality Date   APPENDECTOMY     BREAST BIOPSY  05/04/1987   Right Dr Neysa   BREAST IMPLANT REMOVAL Right 06/05/2010   BUNIONECTOMY  10/15/1986   carcinoid Right    Right lower lobectomy 2017   COLONOSCOPY     COLONOSCOPY W/ POLYPECTOMY     EYE SURGERY  5/14 ; 6/14  Cataract Surgery   JOINT REPLACEMENT     2002 left; 2009 right Hip, rt knee 2012   KNEE ARTHROPLASTY Right 10/15/2010   Darryle Long   LOBECTOMY Right 01/05/2016   Procedure: RIGHT LOWER LOBE LOBECTOMY;  Surgeon: Elspeth JAYSON Millers, MD;  Location: [facility] OR;  Service: Thoracic;  Laterality: Right;   MASTECTOMY PARTIAL / LUMPECTOMY W/ AXILLARY LYMPHADENECTOMY  04/05/1987   Right - Dr Neysa   RESECTION OF MEDIASTINAL MASS N/A 01/05/2016   Procedure: RESECTION OF PLEURAL MASS, right;  Surgeon: Elspeth JAYSON Millers, MD;  Location: [facility] OR;  Service: Thoracic;  Laterality: N/A;   TONSILLECTOMY     TOTAL HIP ARTHROPLASTY     TUBAL LIGATION Bilateral 10/15/1968   VAGINAL HYSTERECTOMY  10/16/1975   myomata   VIDEO ASSISTED THORACOSCOPY (VATS)/WEDGE RESECTION Right 01/05/2016   Procedure: RIGHT VIDEO ASSISTED THORACOSCOPY (VATS)/WEDGE RESECTION;  Surgeon: Elspeth JAYSON Millers, MD;  Location: [facility] OR;  Service: Thoracic;  Laterality: Right;    Allergies  Allergen Reactions   Aspirin Rash    Allergies as of 10/17/2023       Reactions   Aspirin Rash        Medication List        Accurate as of October 17, 2023 11:59 PM. If you have any questions, ask your nurse or doctor.           acetaminophen  500 MG tablet Commonly known as: TYLENOL  Take 2 tablets (1,000 mg total) by mouth every 6 (six) hours as needed for mild pain.   ALIGN PO Take by mouth.   amoxicillin -clavulanate 875-125 MG tablet Commonly known as: AUGMENTIN  Take 1 tablet by mouth 2 (two) times daily. Started by: Dylynn Ketner X Jennings Stirling   apixaban  2.5 MG Tabs tablet Commonly known as: Eliquis  Take 1 tablet (2.5 mg total) by mouth 2 (two) times daily.   bisacodyl  5 MG EC tablet Commonly known as: DULCOLAX Take 1 tablet (5 mg total) by mouth daily as needed for severe constipation.   Calcium  Carbonate-Vitamin D  600-400 MG-UNIT tablet Take 1 tablet by mouth daily.   carvedilol  3.125 MG tablet Commonly known as: COREG  Take 1 tablet (3.125 mg total) by mouth 2 (two) times daily with a meal.   Cipro  HC OTIC suspension Generic drug: ciprofloxacin -hydrocortisone Place 3 drops into the right ear 2 (two) times daily.   fexofenadine  180 MG tablet Commonly known as: Allegra  Allergy Take 1 tablet (180 mg total) by mouth daily.   Fish Oil 1000 MG Caps Take 1 capsule by mouth daily.   fluticasone  50 MCG/ACT nasal spray Commonly known as: FLONASE  Place 2 sprays into both nostrils daily.   lactose free nutrition Liqd Take 237 mLs by mouth 2 (two) times daily between meals.   losartan  25 MG tablet Commonly known as: COZAAR  Take 1 tablet (25 mg total) by mouth daily.   pantoprazole  40 MG tablet Commonly known as: PROTONIX  TAKE ONE TABLET BY MOUTH DAILY   polyethylene glycol 17 g packet Commonly known as: MIRALAX  / GLYCOLAX  Take 17 g by mouth daily as needed for moderate constipation.   rosuvastatin  10 MG tablet Commonly known as: CRESTOR  Take 1 tablet (10 mg total) by mouth at bedtime.   simethicone  80 MG chewable tablet Commonly known as: Gas-X Chew 1 tablet (80 mg total) by mouth every 6 (six) hours as needed for flatulence.   spironolactone  25 MG tablet Commonly known as:  ALDACTONE  Take 0.5 tablet (12.5 mg total) by mouth daily.   Systane Complete  0.6 % Soln Generic drug: Propylene Glycol Place 1 drop into both eyes daily.   torsemide  20 MG tablet Commonly known as: DEMADEX  Take 1 tablet (20 mg total) by mouth daily.   Vitamin D2 10 MCG (400 UNIT) Tabs Take 400 Units by mouth daily.        Review of Systems:  Review of Systems  Constitutional:  Negative for appetite change, fatigue and fever.  HENT:  Positive for congestion, hearing loss and sinus pressure. Negative for ear pain, facial swelling, postnasal drip, rhinorrhea, sinus pain, sore throat, trouble swallowing and voice change.   Eyes:  Negative for visual disturbance.  Respiratory:  Positive for cough. Negative for shortness of breath.        Hx of right lower lobectomy 2017. Chronic cough remains the same. Uses O2 via [place] at night   Cardiovascular:  Negative for leg swelling.  Gastrointestinal:  Negative for abdominal pain, constipation and nausea.  Genitourinary:  Positive for frequency. Negative for dysuria and urgency.       2-3x/night for years   Musculoskeletal:  Positive for arthralgias and gait problem.  Skin:  Negative for color change.  Neurological:  Negative for speech difficulty, weakness and headaches.  Psychiatric/Behavioral:  Negative for behavioral problems and sleep disturbance. The patient is not nervous/anxious.     Health Maintenance  Topic Date Due   Pneumonia Vaccine 36+ Years old (2 of 2 - PCV) 10/23/2015   Zoster Vaccines- Shingrix (2 of 2) 06/19/2021   COVID-19 Vaccine (4 - 2024-25 season) 10/09/2023   Medicare Annual Wellness (AWV)  02/14/2024   INFLUENZA VACCINE  Completed   DEXA SCAN  Completed   HPV VACCINES  Aged Out   DTaP/Tdap/Td  Discontinued    Physical Exam: Vitals:   10/17/23 1350  BP: 136/72  Pulse: 75  Resp: 18  Temp: 97.8 F (36.6 C)  SpO2: 98%  Weight: 128 lb 12.8 oz (58.4 kg)  Height: 4' 11 (1.499 m)   Body mass index is 26.01  kg/m. Physical Exam Vitals and nursing note reviewed.  Constitutional:      Appearance: Normal appearance.  HENT:     Head: Normocephalic and atraumatic.     Right Ear: Tympanic membrane normal.     Left Ear: Tympanic membrane normal.     Nose: No congestion or rhinorrhea.     Mouth/Throat:     Mouth: Mucous membranes are moist.     Pharynx: No oropharyngeal exudate or posterior oropharyngeal erythema.     Comments: Red and enlarged tonsils.  Eyes:     Extraocular Movements: Extraocular movements intact.     Conjunctiva/sclera: Conjunctivae normal.     Pupils: Pupils are equal, round, and reactive to light.  Cardiovascular:     Rate and Rhythm: Normal rate. Rhythm irregular.     Heart sounds: No murmur heard. Pulmonary:     Breath sounds: Rales present. No wheezing.     Comments: s/p right lung lobectomy 2017. Right lumpectomy 30 years ago. Posterior left basilar rales.  Abdominal:     General: Bowel sounds are normal.     Palpations: Abdomen is soft.     Tenderness: There is no abdominal tenderness.  Musculoskeletal:     Cervical back: Normal range of motion.     Right lower leg: No edema.     Left lower leg: No edema.  Skin:    General: Skin is warm and dry.  Neurological:     General: No focal deficit  present.     Mental Status: She is alert and oriented to person, place, and time. Mental status is at baseline.     Gait: Gait abnormal.  Psychiatric:        Mood and Affect: Mood normal.        Behavior: Behavior normal.        Thought Content: Thought content normal.        Judgment: Judgment normal.     Labs reviewed: Basic Metabolic Panel: Recent Labs    11/16/22 1019 12/05/22 1315 12/06/22 0045 12/07/22 0046 12/08/22 0043 12/09/22 0038 12/17/22 1346  NA  --  129*   < > 128* 131* 129* 133*  K  --  4.3   < > 3.9 3.4* 3.7 4.8  CL  --  94*   < > 91* 89* 89* 90*  CO2  --  25   < > 28 27 29 26   GLUCOSE  --  103*   < > 96 87 89 81  BUN  --  7*   < > 13 15  18 19   CREATININE  --  0.79   < > 0.92 1.08* 1.13* 0.96  CALCIUM   --  9.1   < > 8.6* 8.8* 8.5* 9.5  MG  --  2.1  --  2.0  --   --   --   PHOS  --   --   --   --  4.6 5.0*  --   TSH 1.450  --   --   --   --   --   --    < > = values in this interval not displayed.   Liver Function Tests: Recent Labs    12/08/22 0043 12/09/22 0038  ALBUMIN 3.6 3.5   No results for input(s): LIPASE, AMYLASE in the last 8760 hours. No results for input(s): AMMONIA in the last 8760 hours. CBC: Recent Labs    11/16/22 1019 12/05/22 1315 12/17/22 1346  WBC 4.0 4.3 4.4  NEUTROABS  --  3.6  --   HGB 11.9 11.4* 11.3  HCT 37.0 35.3* 34.4  MCV 97 98.1 95  PLT 159 152 191   Lipid Panel: Recent Labs    01/15/23 0719  CHOL 146  HDL 61  LDLCALC 70  TRIG 73  CHOLHDL 2.4   No results found for: HGBA1C  Procedures since last visit: No results found.  Assessment/Plan  Chronic sinusitis sinus pressure, benign ear exam, no TMJ tenderness, no dental/gum pain/swelling.  Nasal congestion, on Allegra , Flonase  CT maxillofacial Empirical antibiotics Augmentin  875mg  bid x 7days.  ENT  CBC/diff, CMP/eGFR  Hyponatremia Na 133 12/17/22, had Nephrology evaluation. 2089ml/day fluid restriction.   Essential hypertension Blood pressure is controlled, takes Losartan , Coreg , Rosuvastatin . Bun/creat  33/1.25 05/31/21  Atrial fibrillation, chronic (HCC) Heart rate is in control,  takes Coreg , Eliquis , TSH 1.98 02/02/21  CHF (congestive heart failure) (HCC)  chronic edema BLE, near none,  takes Spirolactone, Torsemide , EF 60-65%, hospitalized 05/30/20-06/08/20 for acute on CHF,  . F/u cardiology,  on 2022ml/day fluid restriction.  CKD (chronic kidney disease) stage 3, GFR 30-59 ml/min (HCC) Bun/creat 19/0.96 12/17/22  Slow transit constipation  takes MiraLax , Bisacodyl .   GERD (gastroesophageal reflux disease) Stable,  takes Pantoprazole , bloated abd sometimes, takes Simethicone   IDA (iron  deficiency anemia) stable, Hgb 11.3 12/17/22,  off Fe  Hyperlipidemia LDL 70 01/15/23, takes Rosuvastatin .   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 Small table lung nodules, no need for continued f/u CT scan.      Labs/tests ordered:  none  Next appt:  11/01/2023

## 2023-10-17 NOTE — Assessment & Plan Note (Signed)
 Bun/creat 19/0.96 12/17/22

## 2023-10-17 NOTE — Assessment & Plan Note (Signed)
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 Small table lung nodules, no need for continued f/u CT scan.

## 2023-10-17 NOTE — Assessment & Plan Note (Signed)
Heart rate is in control,  takes Coreg, Eliquis, TSH 1.98 02/02/21

## 2023-10-18 ENCOUNTER — Encounter: Payer: Self-pay | Admitting: Nurse Practitioner

## 2023-10-30 ENCOUNTER — Encounter: Payer: Medicare Other | Admitting: Family Medicine

## 2023-11-01 ENCOUNTER — Non-Acute Institutional Stay: Payer: Medicare Other | Admitting: Sports Medicine

## 2023-11-01 ENCOUNTER — Encounter: Payer: Self-pay | Admitting: Sports Medicine

## 2023-11-01 ENCOUNTER — Encounter: Payer: Medicare Other | Admitting: Sports Medicine

## 2023-11-01 VITALS — BP 128/78 | HR 68 | Temp 97.4°F | Resp 18 | Ht 59.0 in | Wt 128.8 lb

## 2023-11-01 DIAGNOSIS — I482 Chronic atrial fibrillation, unspecified: Secondary | ICD-10-CM

## 2023-11-01 DIAGNOSIS — I10 Essential (primary) hypertension: Secondary | ICD-10-CM | POA: Diagnosis not present

## 2023-11-01 DIAGNOSIS — N1832 Chronic kidney disease, stage 3b: Secondary | ICD-10-CM | POA: Diagnosis not present

## 2023-11-01 DIAGNOSIS — E785 Hyperlipidemia, unspecified: Secondary | ICD-10-CM

## 2023-11-01 DIAGNOSIS — E871 Hypo-osmolality and hyponatremia: Secondary | ICD-10-CM | POA: Diagnosis not present

## 2023-11-01 DIAGNOSIS — K219 Gastro-esophageal reflux disease without esophagitis: Secondary | ICD-10-CM

## 2023-11-01 DIAGNOSIS — K5901 Slow transit constipation: Secondary | ICD-10-CM

## 2023-11-01 DIAGNOSIS — R7989 Other specified abnormal findings of blood chemistry: Secondary | ICD-10-CM

## 2023-11-01 NOTE — Progress Notes (Signed)
Careteam: Patient Care Team: Venita Sheffield, MD as PCP - General (Internal Medicine) O'Neal, Ronnald Ramp, MD as PCP - Cardiology (Cardiology) Jerene Bears, MD (Gynecology) Pierce Crane, MD (Inactive) (Hematology and Oncology) Carman Ching, MD (Inactive) (Gastroenterology) Mast, Man X, NP as Nurse Practitioner (Internal Medicine)  PLACE OF SERVICE:  [facility]  Advanced Directive information    Allergies  Allergen Reactions   Aspirin Rash    Chief Complaint  Patient presents with   Medical Management of Chronic Issues    6 month follow-up   Immunizations    Pneumonia and Shingrix   Discussed the use of AI scribe software for clinical note transcription with the patient, who gave verbal consent to proceed.  History of Present Illness   The patient, with a history of arthritis, sinus infection, and neuropathy, presents with multiple joint pain, particularly in the left knee and elbow. She reports a sensation of tingling in her legs at night.   She manages her arthritis pain with Tylenol, taken twice daily, and Biofreeze applied to the affected areas.  The patient also reports chronic constipation, managed with a diet high in fruit and prune juice, and supplemented with MiraLAX and milk of magnesia as needed. She has a history of atrial fibrillation, which she is aware of mostly when lying down, but it does not cause discomfort.     The patient is also on blood thinners (Eliquis) and denies any blood in stool or urine. She has been managing her allergies with Flonase and Allegra. She also takes losartan for blood pressure, Protonix for gas buildup, Crestor, spironolactone, and torsemide.  The patient is generally active, walking from her room to the dining hall for meals, sometimes making two trips a day. She reports no recent falls and denies feeling depressed, although she admits to some worry about the current world situation.             Review of Systems:   Review of Systems  Constitutional:  Negative for chills and fever.  HENT:  Negative for congestion and sore throat.   Eyes:  Negative for double vision.  Respiratory:  Negative for cough, sputum production and shortness of breath.   Cardiovascular:  Negative for chest pain, palpitations and leg swelling.  Gastrointestinal:  Negative for abdominal pain, heartburn and nausea.  Genitourinary:  Negative for dysuria, frequency and hematuria.  Musculoskeletal:  Negative for falls and myalgias.  Neurological:  Negative for dizziness, sensory change and focal weakness.   Negative unless indicated in HPI.   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   Right lower lobectomy   Diverticulitis 1999   Femur fracture (HCC) 03/2013   left, non weight bearing for 2 1/2 weeks   GERD (gastroesophageal reflux disease)    Hemorrhoids 1991   HTN (hypertension)    Lymphedema of upper extremity    right arm   Osteoporosis    PONV (postoperative nausea and vomiting)    Urinary, incontinence, stress female    Vitamin D deficiency disease    Past Surgical History:  Procedure Laterality Date   APPENDECTOMY     BREAST BIOPSY  05/04/1987   Right Dr Maple Hudson   BREAST IMPLANT REMOVAL Right 06/05/2010   BUNIONECTOMY  10/15/1986  carcinoid Right    Right lower lobectomy 2017   COLONOSCOPY     COLONOSCOPY W/ POLYPECTOMY     EYE SURGERY  5/14 ; 6/14   Cataract Surgery   JOINT REPLACEMENT     2002 left; 2009 right Hip, rt knee 2012   KNEE ARTHROPLASTY Right 10/15/2010   [facility] Spore [facility]   LOBECTOMY Right 01/05/2016   Procedure: RIGHT LOWER LOBE LOBECTOMY;  Surgeon: Loreli Slot, MD;  Location: [facility] OR;  Service: Thoracic;  Laterality: Right;   MASTECTOMY PARTIAL / LUMPECTOMY W/ AXILLARY LYMPHADENECTOMY   04/05/1987   Right - Dr Maple Hudson   RESECTION OF MEDIASTINAL MASS N/A 01/05/2016   Procedure: RESECTION OF PLEURAL MASS, right;  Surgeon: Loreli Slot, MD;  Location: [facility] OR;  Service: Thoracic;  Laterality: N/A;   TONSILLECTOMY     TOTAL HIP ARTHROPLASTY     TUBAL LIGATION Bilateral 10/15/1968   VAGINAL HYSTERECTOMY  10/16/1975   myomata   VIDEO ASSISTED THORACOSCOPY (VATS)/WEDGE RESECTION Right 01/05/2016   Procedure: RIGHT VIDEO ASSISTED THORACOSCOPY (VATS)/WEDGE RESECTION;  Surgeon: Loreli Slot, MD;  Location: [facility] OR;  Service: Thoracic;  Laterality: Right;   Social History:   reports that she has never smoked. She has never used smokeless tobacco. She reports that she does not drink alcohol and does not use drugs.  Family History  Problem Relation Age of Onset   Colon cancer Mother    Colon cancer Father    Heart disease Father    Stomach cancer Neg Hx    Liver cancer Neg Hx    Esophageal cancer Neg Hx     Medications: Patient's Medications  New Prescriptions   No medications on file  Previous Medications   ACETAMINOPHEN (TYLENOL) 500 MG TABLET    Take 2 tablets (1,000 mg total) by mouth every 6 (six) hours as needed for mild pain.   AMOXICILLIN-CLAVULANATE (AUGMENTIN) 875-125 MG TABLET    Take 1 tablet by mouth 2 (two) times daily.   APIXABAN (ELIQUIS) 2.5 MG TABS TABLET    Take 1 tablet (2.5 mg total) by mouth 2 (two) times daily.   BISACODYL (DULCOLAX) 5 MG EC TABLET    Take 1 tablet (5 mg total) by mouth daily as needed for severe constipation.   CALCIUM CARBONATE-VITAMIN D 600-400 MG-UNIT TABLET    Take 1 tablet by mouth daily.   CARVEDILOL (COREG) 3.125 MG TABLET    Take 1 tablet (3.125 mg total) by mouth 2 (two) times daily with a meal.   CIPROFLOXACIN-HYDROCORTISONE (CIPRO HC) OTIC SUSPENSION    Place 3 drops into the right ear 2 (two) times daily.   ERGOCALCIFEROL (VITAMIN D2) 10 MCG (400 UNIT) TABS    Take 400 Units by mouth daily.   FEXOFENADINE  (ALLEGRA ALLERGY) 180 MG TABLET    Take 1 tablet (180 mg total) by mouth daily.   FLUTICASONE (FLONASE) 50 MCG/ACT NASAL SPRAY    Place 2 sprays into both nostrils daily.   LACTOSE FREE NUTRITION (BOOST) LIQD    Take 237 mLs by mouth 2 (two) times daily between meals.   LOSARTAN (COZAAR) 25 MG TABLET    Take 1 tablet (25 mg total) by mouth daily.   OMEGA-3 FATTY ACIDS (FISH OIL) 1000 MG CAPS    Take 1 capsule by mouth daily.   PANTOPRAZOLE (PROTONIX) 40 MG TABLET    TAKE ONE TABLET BY MOUTH DAILY   POLYETHYLENE GLYCOL (MIRALAX / GLYCOLAX) 17 G PACKET    Take 17  g by mouth daily as needed for moderate constipation.   PROBIOTIC PRODUCT (ALIGN PO)    Take by mouth.   PROPYLENE GLYCOL (SYSTANE COMPLETE) 0.6 % SOLN    Place 1 drop into both eyes daily.   ROSUVASTATIN (CRESTOR) 10 MG TABLET    Take 1 tablet (10 mg total) by mouth at bedtime.   SIMETHICONE (GAS-X) 80 MG CHEWABLE TABLET    Chew 1 tablet (80 mg total) by mouth every 6 (six) hours as needed for flatulence.   SPIRONOLACTONE (ALDACTONE) 25 MG TABLET    Take 0.5 tablet (12.5 mg total) by mouth daily.   TORSEMIDE (DEMADEX) 20 MG TABLET    Take 1 tablet (20 mg total) by mouth daily.  Modified Medications   No medications on file  Discontinued Medications   No medications on file    Physical Exam: Vitals:   11/01/23 1117  BP: 128/78  Pulse: 68  Resp: 18  Temp: (!) 97.4 F (36.3 C)  SpO2: 95%  Weight: 128 lb 12.8 oz (58.4 kg)  Height: 4\' 11"  (1.499 m)   Body mass index is 26.01 kg/m. BP Readings from Last 3 Encounters:  11/01/23 128/78  10/17/23 136/72  09/27/23 (!) 140/68   Wt Readings from Last 3 Encounters:  11/01/23 128 lb 12.8 oz (58.4 kg)  10/17/23 128 lb 12.8 oz (58.4 kg)  09/27/23 127 lb 12.8 oz (58 kg)    Physical Exam Constitutional:      Appearance: Normal appearance.  HENT:     Head: Normocephalic and atraumatic.  Cardiovascular:     Rate and Rhythm: Normal rate and regular rhythm.  Pulmonary:      Effort: Pulmonary effort is normal. No respiratory distress.     Breath sounds: Normal breath sounds. No wheezing.  Abdominal:     General: Bowel sounds are normal. There is no distension.     Tenderness: There is no abdominal tenderness. There is no guarding or rebound.     Comments:    Musculoskeletal:        General: No swelling or tenderness.  Skin:    General: Skin is dry.  Neurological:     Mental Status: She is alert. Mental status is at baseline.     Sensory: No sensory deficit.     Motor: No weakness.     Labs reviewed: Basic Metabolic Panel: Recent Labs    11/16/22 1019 12/05/22 1315 12/06/22 0045 12/07/22 0046 12/08/22 0043 12/09/22 0038 12/17/22 1346  NA  --  129*   < > 128* 131* 129* 133*  K  --  4.3   < > 3.9 3.4* 3.7 4.8  CL  --  94*   < > 91* 89* 89* 90*  CO2  --  25   < > 28 27 29 26   GLUCOSE  --  103*   < > 96 87 89 81  BUN  --  7*   < > 13 15 18 19   CREATININE  --  0.79   < > 0.92 1.08* 1.13* 0.96  CALCIUM  --  9.1   < > 8.6* 8.8* 8.5* 9.5  MG  --  2.1  --  2.0  --   --   --   PHOS  --   --   --   --  4.6 5.0*  --   TSH 1.450  --   --   --   --   --   --    < > =  values in this interval not displayed.   Liver Function Tests: Recent Labs    12/08/22 0043 12/09/22 0038  ALBUMIN 3.6 3.5   No results for input(s): "LIPASE", "AMYLASE" in the last 8760 hours. No results for input(s): "AMMONIA" in the last 8760 hours. CBC: Recent Labs    11/16/22 1019 12/05/22 1315 12/17/22 1346  WBC 4.0 4.3 4.4  NEUTROABS  --  3.6  --   HGB 11.9 11.4* 11.3  HCT 37.0 35.3* 34.4  MCV 97 98.1 95  PLT 159 152 191   Lipid Panel: Recent Labs    01/15/23 0719  CHOL 146  HDL 61  LDLCALC 70  TRIG 73  CHOLHDL 2.4   TSH: Recent Labs    11/16/22 1019  TSH 1.450   A1C: No results found for: "HGBA1C"   Assessment/Plan Osteoarthritis Multiple joint pain, particularly in the left knee and elbow. Managed with Tylenol and Biofreeze as needed. Steroid  injections have been ineffective. -Continue current management plan.  Peripheral Neuropathy Reports of nightly leg tingling. Not associated with Crestor use. -No changes to current management plan.  Chronic Constipation Managed with diet (fruit, prunes, prune juice) and MiraLAX as needed. Milk of Magnesia used for severe episodes. -Continue current management plan.  Atrial Fibrillation Aware of occasional fast heart rate, particularly when lying down. No discomfort. Managed with Eliquis 2.5mg  twice daily and Carvedilol. -Continue current management plan.  Hypertension Managed with Losartan. -Continue current management plan.  Hyperlipidemia Managed with Crestor. No muscle cramping reported. -Continue current management plan.  Gastroesophageal Reflux Disease (GERD) Reports of gas buildup. Managed with Protonix. -Continue current management plan.  General Health Maintenance -Order labs to check blood counts, kidney function, liver enzymes, magnesium levels, vitamin D, and sodium intake. -Next appointment in three months.   - CBC With Differential/Platelet - Complete Metabolic Panel with eGFR   Hypomagnesemia Magnesium  Low vitamin D level Vitamin D, 25-hydroxy     30 min Total time spent for obtaining history,  performing a medically appropriate examination and evaluation, reviewing the tests,   documenting clinical information in the electronic or other health record,   ,care coordination (not separately reported)

## 2023-11-05 ENCOUNTER — Other Ambulatory Visit: Payer: Medicare Other

## 2023-11-06 ENCOUNTER — Other Ambulatory Visit: Payer: Self-pay | Admitting: Sports Medicine

## 2023-11-06 ENCOUNTER — Telehealth: Payer: Self-pay | Admitting: Sports Medicine

## 2023-11-06 DIAGNOSIS — E875 Hyperkalemia: Secondary | ICD-10-CM

## 2023-11-06 LAB — LIPID PANEL
Cholesterol: 137 mg/dL (ref <200)
HDL: 65 mg/dL (ref >=50)
LDL Cholesterol (Calc): 58 mg/dL
Non-HDL Cholesterol (Calc): 72 mg/dL (ref <130)
Total CHOL/HDL Ratio: 2.1 (calc) (ref <5.0)
Triglycerides: 63 mg/dL (ref <150)

## 2023-11-06 LAB — CBC WITH DIFFERENTIAL/PLATELET
Absolute Lymphocytes: 600 {cells}/uL — ABNORMAL LOW (ref 850–3900)
Absolute Monocytes: 300 {cells}/uL (ref 200–950)
Basophils Absolute: 30 {cells}/uL (ref 0–200)
Basophils Relative: 0.8 %
Eosinophils Absolute: 19 {cells}/uL (ref 15–500)
Eosinophils Relative: 0.5 %
HCT: 36 % (ref 35.0–45.0)
Hemoglobin: 11.5 g/dL — ABNORMAL LOW (ref 11.7–15.5)
MCH: 31.4 pg (ref 27.0–33.0)
MCHC: 31.9 g/dL — ABNORMAL LOW (ref 32.0–36.0)
MCV: 98.4 fL (ref 80.0–100.0)
MPV: 10.2 fL (ref 7.5–12.5)
Monocytes Relative: 7.9 %
Neutro Abs: 2850 {cells}/uL (ref 1500–7800)
Neutrophils Relative %: 75 %
Platelets: 127 10*3/uL — ABNORMAL LOW (ref 140–400)
RBC: 3.66 10*6/uL — ABNORMAL LOW (ref 3.80–5.10)
RDW: 12.9 % (ref 11.0–15.0)
Total Lymphocyte: 15.8 %
WBC: 3.8 10*3/uL (ref 3.8–10.8)

## 2023-11-06 LAB — COMPLETE METABOLIC PANEL WITH GFR
AG Ratio: 1.8 (calc) (ref 1.0–2.5)
ALT: 10 U/L (ref 6–29)
AST: 38 U/L — ABNORMAL HIGH (ref 10–35)
Albumin: 4.6 g/dL (ref 3.6–5.1)
Alkaline phosphatase (APISO): 68 U/L (ref 37–153)
BUN/Creatinine Ratio: 18 (calc) (ref 6–22)
BUN: 20 mg/dL (ref 7–25)
CO2: 18 mmol/L — ABNORMAL LOW (ref 20–32)
Calcium: 9.6 mg/dL (ref 8.6–10.4)
Chloride: 103 mmol/L (ref 98–110)
Creat: 1.14 mg/dL — ABNORMAL HIGH (ref 0.60–0.95)
Globulin: 2.6 g/dL (ref 1.9–3.7)
Glucose, Bld: 71 mg/dL (ref 65–99)
Potassium: 5.5 mmol/L — ABNORMAL HIGH (ref 3.5–5.3)
Sodium: 143 mmol/L (ref 135–146)
Total Bilirubin: 1.3 mg/dL — ABNORMAL HIGH (ref 0.2–1.2)
Total Protein: 7.2 g/dL (ref 6.1–8.1)
eGFR: 47 mL/min/{1.73_m2} — ABNORMAL LOW (ref >=60)

## 2023-11-06 LAB — VITAMIN D 25 HYDROXY (VIT D DEFICIENCY, FRACTURES): Vit D, 25-Hydroxy: 76 ng/mL (ref 30–100)

## 2023-11-06 LAB — MAGNESIUM: Magnesium: 3.2 mg/dL — ABNORMAL HIGH (ref 1.5–2.5)

## 2023-11-06 MED ORDER — SODIUM POLYSTYRENE SULFONATE 15 GM/60ML CO SUSP: 30.0000 g | Freq: Once | 0 refills | Status: AC

## 2023-11-06 MED ORDER — LOSARTAN POTASSIUM 25 MG PO TABS: 25.0000 mg | ORAL_TABLET | Freq: Every day | ORAL | Status: DC

## 2023-11-06 NOTE — Telephone Encounter (Signed)
    Latest Ref Rng & Units 11/05/2023    7:13 AM 12/17/2022    1:46 PM 12/09/2022   12:38 AM  BMP  Glucose 65 - 99 mg/dL 71  81  89   BUN 7 - 25 mg/dL 20  19  18    Creatinine 0.60 - 0.95 mg/dL 1.91  4.78  2.95   BUN/Creat Ratio 6 - 22 (calc) 18  20    Sodium 135 - 146 mmol/L 143  133  129   Potassium 3.5 - 5.3 mmol/L 5.5  4.8  3.7   Chloride 98 - 110 mmol/L 103  90  89   CO2 20 - 32 mmol/L 18  26  29    Calcium 8.6 - 10.4 mg/dL 9.6  9.5  8.5     Hold losartan, spiranolactone until repeat blood work  She needs to have blood work on Tuesday at [facility]  Avoid potassium rich foods Will give 1 time dose of kayexalate to lower potassium script sent to pharmacy  Repeat BMP next week  Monitor BP

## 2023-11-06 NOTE — Telephone Encounter (Signed)
Called patient and she states that [place] [facility] told her medication Kayexalate isnt available and she needs an alternative sent in. Patient wants medication to be sent in before she schedules lab appointment for [facility] so that everything can be in alignment. BMP ordered for patient.  Patient agreeable to stop losartan/spironolactone and foods high in potassium. Message routed to PCP Venita Sheffield, MD

## 2023-11-07 MED ORDER — PATIROMER SORBITEX CALCIUM 8.4 G PO PACK: 8.4000 g | PACK | Freq: Every day | ORAL | 0 refills | Status: DC

## 2023-11-07 NOTE — Telephone Encounter (Signed)
Alternative was sent in for patient due to requested medication being out of stock

## 2023-11-07 NOTE — Telephone Encounter (Signed)
Katie Newcomer, MD  [facility]10 minutes ago (9:41 AM)    Sent veltassa one time dose to her pharmacy , instead of kayexelate    Discussed additional response with patient and scheduled appointment for next Tuesday at 7:10. Order was placed

## 2023-11-07 NOTE — Addendum Note (Signed)
Addended by: Venita Sheffield on: 11/07/2023 09:41 AM   Modules accepted: Orders

## 2023-11-12 ENCOUNTER — Other Ambulatory Visit: Payer: Medicare Other

## 2023-11-12 DIAGNOSIS — I5041 Acute combined systolic (congestive) and diastolic (congestive) heart failure: Secondary | ICD-10-CM

## 2023-11-12 DIAGNOSIS — R350 Frequency of micturition: Secondary | ICD-10-CM

## 2023-11-12 LAB — COMPLETE METABOLIC PANEL WITH GFR
AG Ratio: 1.8 (calc) (ref 1.0–2.5)
ALT: 13 U/L (ref 6–29)
AST: 25 U/L (ref 10–35)
Albumin: 4.4 g/dL (ref 3.6–5.1)
Alkaline phosphatase (APISO): 69 U/L (ref 37–153)
BUN/Creatinine Ratio: 18 (calc) (ref 6–22)
BUN: 18 mg/dL (ref 7–25)
CO2: 33 mmol/L — ABNORMAL HIGH (ref 20–32)
Calcium: 9.4 mg/dL (ref 8.6–10.4)
Chloride: 94 mmol/L — ABNORMAL LOW (ref 98–110)
Creat: 0.98 mg/dL — ABNORMAL HIGH (ref 0.60–0.95)
Globulin: 2.4 g/dL (ref 1.9–3.7)
Glucose, Bld: 79 mg/dL (ref 65–99)
Potassium: 4.1 mmol/L (ref 3.5–5.3)
Sodium: 135 mmol/L (ref 135–146)
Total Bilirubin: 1.2 mg/dL (ref 0.2–1.2)
Total Protein: 6.8 g/dL (ref 6.1–8.1)
eGFR: 56 mL/min/{1.73_m2} — ABNORMAL LOW (ref >=60)

## 2023-11-18 ENCOUNTER — Encounter: Payer: Self-pay | Admitting: Cardiovascular Disease

## 2023-11-26 ENCOUNTER — Other Ambulatory Visit: Payer: Medicare Other

## 2023-11-26 LAB — BASIC METABOLIC PANEL WITH GFR
BUN/Creatinine Ratio: 20 (calc) (ref 6–22)
BUN: 22 mg/dL (ref 7–25)
CO2: 33 mmol/L — ABNORMAL HIGH (ref 20–32)
Calcium: 9.6 mg/dL (ref 8.6–10.4)
Chloride: 93 mmol/L — ABNORMAL LOW (ref 98–110)
Creat: 1.1 mg/dL — ABNORMAL HIGH (ref 0.60–0.95)
Glucose, Bld: 89 mg/dL (ref 65–99)
Potassium: 4.7 mmol/L (ref 3.5–5.3)
Sodium: 133 mmol/L — ABNORMAL LOW (ref 135–146)
eGFR: 49 mL/min/{1.73_m2} — ABNORMAL LOW (ref >=60)

## 2024-01-17 ENCOUNTER — Encounter: Payer: Self-pay | Admitting: Sports Medicine

## 2024-01-17 ENCOUNTER — Non-Acute Institutional Stay: Payer: Medicare Other | Admitting: Sports Medicine

## 2024-01-17 VITALS — BP 116/78 | HR 70 | Temp 97.0°F | Resp 18 | Ht 59.0 in | Wt 127.0 lb

## 2024-01-17 DIAGNOSIS — K219 Gastro-esophageal reflux disease without esophagitis: Secondary | ICD-10-CM | POA: Diagnosis not present

## 2024-01-17 DIAGNOSIS — M47816 Spondylosis without myelopathy or radiculopathy, lumbar region: Secondary | ICD-10-CM

## 2024-01-17 DIAGNOSIS — I482 Chronic atrial fibrillation, unspecified: Secondary | ICD-10-CM

## 2024-01-17 DIAGNOSIS — N1831 Chronic kidney disease, stage 3a: Secondary | ICD-10-CM

## 2024-01-17 DIAGNOSIS — I5032 Chronic diastolic (congestive) heart failure: Secondary | ICD-10-CM

## 2024-01-17 DIAGNOSIS — G629 Polyneuropathy, unspecified: Secondary | ICD-10-CM | POA: Diagnosis not present

## 2024-01-17 MED ORDER — GABAPENTIN 100 MG PO CAPS: 100.0000 mg | ORAL_CAPSULE | Freq: Every day | ORAL | 3 refills | Status: AC

## 2024-01-17 NOTE — Progress Notes (Signed)
 Careteam: Patient Care Team: Venita Sheffield, MD as PCP - General (Internal Medicine) O'Neal, Ronnald Ramp, MD as PCP - Cardiology (Cardiology) Jerene Bears, MD (Gynecology) Pierce Crane, MD (Inactive) (Hematology and Oncology) Carman Ching, MD (Inactive) (Gastroenterology) Mast, Man X, NP as Nurse Practitioner (Internal Medicine)  PLACE OF SERVICE:  [facility]  Advanced Directive information Does Patient Have a Medical Advance Directive?: No, Would patient like information on creating a medical advance directive?: No - Patient declined  Allergies  Allergen Reactions   Aspirin Rash    Chief Complaint  Patient presents with   Medical Management of Chronic Issues    Three Months Follow-up/needs to discuss Pneumonia, Shingrix vaccine and Medicare Annual Visit     Discussed the use of AI scribe software for clinical note transcription with the patient, who gave verbal consent to proceed.  History of Present Illness  [name] is an 89 year old female who presents with leg pain   She experiences leg pain at night. The pain is described as a tingling and burning sensation in her toes, sometimes feeling like a pricking pain. It occurs in both legs but not every night, and it does not bother her during the day. She has previously tried gabapentin for back pain.  She reports a buildup of wax in her right ear and wants her ears to be checked.  Regarding her breathing, she does not experience shortness of breath during the day but needs to keep her head elevated at night due to heart issues. She does not wake up gasping for breath and denies chest pain or palpitations.  She experiences bloating and gas, which sometimes makes urination difficult until she can pass the gas. She mentions a history of pelvic prolapse, which she associates with her scoliosis and previous hysterectomy. She occasionally experiences urinary incontinence but does not feel any descending vaginal  tissue.  She experiences chronic back pain, which is somewhat constant but varies in intensity. It is more noticeable in the morning and when walking. She manages the pain with Tylenol, taking two tablets in the morning, afternoon, and at night if needed.    Review of Systems:  Review of Systems  Constitutional:  Negative for chills and fever.  HENT:  Negative for congestion and sore throat.   Eyes:  Negative for double vision.  Respiratory:  Negative for cough, sputum production and shortness of breath.   Cardiovascular:  Negative for chest pain, palpitations and leg swelling.  Gastrointestinal:  Negative for abdominal pain, heartburn and nausea.  Genitourinary:  Negative for dysuria, frequency and hematuria.  Musculoskeletal:  Positive for back pain. Negative for falls.  Neurological:  Positive for sensory change. Negative for dizziness and focal weakness.   Negative unless indicated in HPI.   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   Right lower lobectomy   Diverticulitis 1999   Femur fracture (HCC) 03/2013   left, non weight bearing for 2 1/2 weeks   GERD (gastroesophageal reflux disease)    Hemorrhoids 1991   HTN (hypertension)    Lymphedema of upper extremity    right arm   Osteoporosis    PONV (postoperative nausea and vomiting)    Urinary, incontinence, stress female    Vitamin D  deficiency disease    Past Surgical History:  Procedure Laterality Date   APPENDECTOMY     BREAST BIOPSY  05/04/1987   Right Dr Maple Hudson   BREAST IMPLANT REMOVAL Right 06/05/2010   BUNIONECTOMY  10/15/1986   carcinoid Right    Right lower lobectomy 2017   COLONOSCOPY     COLONOSCOPY W/ POLYPECTOMY     EYE SURGERY  5/14 ; 6/14   Cataract Surgery   JOINT REPLACEMENT     2002 left;  2009 right Hip, rt knee 2012   KNEE ARTHROPLASTY Right 10/15/2010   Gerri Spore Long   LOBECTOMY Right 01/05/2016   Procedure: RIGHT LOWER LOBE LOBECTOMY;  Surgeon: Loreli Slot, MD;  Location: [facility] OR;  Service: Thoracic;  Laterality: Right;   MASTECTOMY PARTIAL / LUMPECTOMY W/ AXILLARY LYMPHADENECTOMY  04/05/1987   Right - Dr Maple Hudson   RESECTION OF MEDIASTINAL MASS N/A 01/05/2016   Procedure: RESECTION OF PLEURAL MASS, right;  Surgeon: Loreli Slot, MD;  Location: [facility] OR;  Service: Thoracic;  Laterality: N/A;   TONSILLECTOMY     TOTAL HIP ARTHROPLASTY     TUBAL LIGATION Bilateral 10/15/1968   VAGINAL HYSTERECTOMY  10/16/1975   myomata   VIDEO ASSISTED THORACOSCOPY (VATS)/WEDGE RESECTION Right 01/05/2016   Procedure: RIGHT VIDEO ASSISTED THORACOSCOPY (VATS)/WEDGE RESECTION;  Surgeon: Loreli Slot, MD;  Location: [facility] OR;  Service: Thoracic;  Laterality: Right;   Social History:   reports that she has never smoked. She has never used smokeless tobacco. She reports that she does not drink alcohol and does not use drugs.  Family History  Problem Relation Age of Onset   Colon cancer Mother    Colon cancer Father    Heart disease Father    Stomach cancer Neg Hx    Liver cancer Neg Hx    Esophageal cancer Neg Hx     Medications: Patient's Medications  New Prescriptions   No medications on file  Previous Medications   ACETAMINOPHEN (TYLENOL) 500 MG TABLET    Take 2 tablets (1,000 mg total) by mouth every 6 (six) hours as needed for mild pain.   APIXABAN (ELIQUIS) 2.5 MG TABS TABLET    Take 1 tablet (2.5 mg total) by mouth 2 (two) times daily.   CALCIUM CARBONATE-VITAMIN D 600-400 MG-UNIT TABLET    Take 1 tablet by mouth daily.   CARVEDILOL (COREG) 3.125 MG TABLET    Take 1 tablet (3.125 mg total) by mouth 2 (two) times daily with a meal.   ERGOCALCIFEROL (VITAMIN D2) 10 MCG (400 UNIT) TABS    Take 400 Units by mouth daily.   FEXOFENADINE (ALLEGRA ALLERGY) 180 MG TABLET     Take 1 tablet (180 mg total) by mouth daily.   FLUTICASONE (FLONASE) 50 MCG/ACT NASAL SPRAY    Place 2 sprays into both nostrils daily.   LACTOSE FREE NUTRITION (BOOST) LIQD    Take 237 mLs by mouth 2 (two) times daily between meals.   LOSARTAN (COZAAR) 25 MG TABLET    Take 1 tablet (25 mg total) by mouth daily.   OMEGA-3 FATTY ACIDS (FISH OIL) 1000 MG CAPS    Take 1 capsule by mouth daily.   PANTOPRAZOLE (PROTONIX) 40 MG TABLET    TAKE ONE TABLET BY MOUTH DAILY   PATIROMER (VELTASSA) 8.4 G PACKET    Take 1 packet (8.4 g total) by mouth daily.   POLYETHYLENE GLYCOL (MIRALAX / GLYCOLAX) 17 G PACKET    Take 17 g by mouth daily  as needed for moderate constipation.   PROBIOTIC PRODUCT (ALIGN PO)    Take by mouth.   PROPYLENE GLYCOL (SYSTANE COMPLETE) 0.6 % SOLN    Place 1 drop into both eyes daily.   ROSUVASTATIN (CRESTOR) 10 MG TABLET    Take 1 tablet (10 mg total) by mouth at bedtime.   SIMETHICONE (GAS-X) 80 MG CHEWABLE TABLET    Chew 1 tablet (80 mg total) by mouth every 6 (six) hours as needed for flatulence.   SPIRONOLACTONE (ALDACTONE) 25 MG TABLET    Take 0.5 tablet (12.5 mg total) by mouth daily.   TORSEMIDE (DEMADEX) 20 MG TABLET    Take 1 tablet (20 mg total) by mouth daily.  Modified Medications   No medications on file  Discontinued Medications   No medications on file    Physical Exam: Vitals:   01/17/24 0949  BP: 116/78  Pulse: 70  Resp: 18  Temp: (!) 97 F (36.1 C)  SpO2: 95%  Weight: 127 lb (57.6 kg)  Height: 4\' 11"  (1.499 m)   Body mass index is 25.65 kg/m. BP Readings from Last 3 Encounters:  01/17/24 116/78  11/01/23 128/78  10/17/23 136/72   Wt Readings from Last 3 Encounters:  01/17/24 127 lb (57.6 kg)  11/01/23 128 lb 12.8 oz (58.4 kg)  10/17/23 128 lb 12.8 oz (58.4 kg)    Physical Exam Constitutional:      Appearance: Normal appearance.  HENT:     Head: Normocephalic and atraumatic.  Cardiovascular:     Rate and Rhythm: Normal rate and regular  rhythm.  Pulmonary:     Effort: Pulmonary effort is normal. No respiratory distress.     Breath sounds: Normal breath sounds. No wheezing.  Abdominal:     General: Bowel sounds are normal. There is no distension.     Tenderness: There is no abdominal tenderness. There is no guarding or rebound.     Comments:    Musculoskeletal:        General: Swelling (1+ pitting odema) present. No tenderness.  Neurological:     Mental Status: She is alert. Mental status is at baseline.     Motor: No weakness.     Labs reviewed: Basic Metabolic Panel: Recent Labs    11/05/23 0713 11/12/23 0723 11/26/23 0718  NA 143 135 133*  K 5.5* 4.1 4.7  CL 103 94* 93*  CO2 18* 33* 33*  GLUCOSE 71 79 89  BUN 20 18 22   CREATININE 1.14* 0.98* 1.10*  CALCIUM 9.6 9.4 9.6  MG 3.2*  --   --    Liver Function Tests: Recent Labs    11/05/23 0713 11/12/23 0723  AST 38* 25  ALT 10 13  BILITOT 1.3* 1.2  PROT 7.2 6.8   No results for input(s): "LIPASE", "AMYLASE" in the last 8760 hours. No results for input(s): "AMMONIA" in the last 8760 hours. CBC: Recent Labs    11/05/23 0713  WBC 3.8  NEUTROABS 2,850  HGB 11.5*  HCT 36.0  MCV 98.4  PLT 127*   Lipid Panel: Recent Labs    11/05/23 0715  CHOL 137  HDL 65  LDLCALC 58  TRIG 63  CHOLHDL 2.1   TSH: No results for input(s): "TSH" in the last 8760 hours. A1C: No results found for: "HGBA1C"  Assessment and Plan Assessment & Plan     1. Neuropathy (Primary) Will start low dose gabapentin  Informed patient about the side effects of the medication  - gabapentin (  NEURONTIN) 100 MG capsule; Take 1 capsule (100 mg total) by mouth at bedtime.  Dispense: 90 capsule; Refill: 3  2. Atrial fibrillation, chronic (HCC) Rate controlled No signs of bleeding Cont with eliquis  3. Chronic diastolic congestive heart failure (HCC) Euvolemic on exam today  Cont with torsemide    Latest Ref Rng & Units 11/26/2023    7:18 AM 11/12/2023    7:23 AM  11/05/2023    7:13 AM  BMP  Glucose 65 - 99 mg/dL 89  79  71   BUN 7 - 25 mg/dL 22  18  20    Creatinine 0.60 - 0.95 mg/dL 1.61  0.96  0.45   BUN/Creat Ratio 6 - 22 (calc) 20  18  18    Sodium 135 - 146 mmol/L 133  135  143   Potassium 3.5 - 5.3 mmol/L 4.7  4.1  5.5   Chloride 98 - 110 mmol/L 93  94  103   CO2 20 - 32 mmol/L 33  33  18   Calcium 8.6 - 10.4 mg/dL 9.6  9.4  9.6      4. Gastroesophageal reflux disease, unspecified whether esophagitis present Stable  Cont with protonix  5. Spondylosis of lumbar region without myelopathy or radiculopathy Stable  Take tylenol prn for pain    6. Stage 3a chronic kidney disease (HCC) Avoid nephrotoxic meds   30 min Total time spent for obtaining history,  performing a medically appropriate examination and evaluation, reviewing the tests,   documenting clinical information in the electronic or other health record,   ,care coordination (not separately reported)

## 2024-04-24 ENCOUNTER — Encounter: Payer: Self-pay | Admitting: Sports Medicine

## 2024-04-24 ENCOUNTER — Ambulatory Visit (INDEPENDENT_AMBULATORY_CARE_PROVIDER_SITE_OTHER): Admitting: Sports Medicine

## 2024-04-24 VITALS — BP 122/78 | HR 58 | Temp 97.5°F | Ht 59.0 in | Wt 124.0 lb

## 2024-04-24 DIAGNOSIS — I482 Chronic atrial fibrillation, unspecified: Secondary | ICD-10-CM

## 2024-04-24 DIAGNOSIS — I10 Essential (primary) hypertension: Secondary | ICD-10-CM | POA: Diagnosis not present

## 2024-04-24 DIAGNOSIS — K219 Gastro-esophageal reflux disease without esophagitis: Secondary | ICD-10-CM

## 2024-04-24 DIAGNOSIS — I5032 Chronic diastolic (congestive) heart failure: Secondary | ICD-10-CM | POA: Diagnosis not present

## 2024-04-24 DIAGNOSIS — M159 Polyosteoarthritis, unspecified: Secondary | ICD-10-CM

## 2024-04-24 NOTE — Progress Notes (Signed)
 Careteam: Patient Care Team: Sherlynn Madden, MD as PCP - General (Internal Medicine) O'Neal, Darryle Ned, MD as PCP - Cardiology (Cardiology) Cleotilde Ronal RAMAN, MD (Gynecology) Melodye Coy, MD (Inactive) (Hematology and Oncology) Celestia Agent, MD (Inactive) (Gastroenterology) Mast, Man X, NP as Nurse Practitioner (Internal Medicine)  PLACE OF SERVICE:  [facility]  Advanced Directive information                                        [facility]  Allergies  Allergen Reactions   Aspirin Rash    Chief Complaint  Patient presents with   Medical Management of Chronic Issues    3 month follow up / no conerns      Discussed the use of AI scribe software for clinical note transcription with the patient, who gave verbal consent to proceed.  History of Present Illness  The patient presents for a regular follow-up visit.  She consistently uses a walker and has not experienced any falls since her last visit. She reports fatigue and takes gabapentin  at night  for neuropathy in her legs.  She engages in physical activity by walking to the dining room and back, and up and down the hall.   She can perform daily activities such as changing sheets, washing, and cleaning the bathroom, although she sometimes feels breathy and needs to stop to catch her breath.  Her current medications include Eliquis  2.5 mg twice a day for atrial fibrillation, Coreg  3.125 mg twice a day, allergy medication, nasal spray, gabapentin , losartan  for blood pressure, Crestor  for cholesterol, and Demodex for swelling. No blood in stool or urine and no burning during urination.  She experiences heartburn or acid reflux and has a cough and congestion, which she attributes to allergies and acid reflux. She wears compression stockings for leg swelling. She cannot lay flat due to breathing difficulties and uses an electric bed to remain elevated. She uses oxygen at night.  She reports joint pain in her left  knee and back, for which she takes Tylenol , although it does not provide relief. She has two artificial hips and experiences imbalance upon waking until she puts on her shoes. She sometimes feels a little dizzy but denies significant lightheadedness.  Her sleep is not as restful as it used to be, and she wakes up once or twice to use the bathroom. Gabapentin  helps with sleep by making her drowsy. She does not take any additional vitamin D  supplements as her levels are high, and she takes a calcium  and vitamin D  combination supplement.    Review of Systems:  Review of Systems  Constitutional:  Negative for fever.  HENT:  Negative for congestion and sore throat.   Eyes:  Negative for double vision.  Respiratory:  Negative for cough, sputum production and shortness of breath.   Cardiovascular:  Negative for chest pain, palpitations and leg swelling.  Gastrointestinal:  Negative for abdominal pain, heartburn and nausea.  Genitourinary:  Negative for dysuria, frequency and hematuria.  Musculoskeletal:  Positive for back pain and myalgias. Negative for falls.  Neurological:  Negative for dizziness, sensory change and focal weakness.  Psychiatric/Behavioral:  Negative for depression.    Negative unless indicated in HPI.   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   Right lower lobectomy   Diverticulitis 1999   Femur fracture (HCC) 03/2013   left, non weight bearing for 2 1/2 weeks   GERD (gastroesophageal reflux disease)    Hemorrhoids 1991   HTN (hypertension)    Lymphedema of upper extremity    right arm   Osteoporosis    PONV (postoperative nausea and vomiting)    Urinary, incontinence, stress female    Vitamin D  deficiency disease    Past Surgical History:  Procedure Laterality  Date   APPENDECTOMY     BREAST BIOPSY  05/04/1987   Right Dr Neysa   BREAST IMPLANT REMOVAL Right 06/05/2010   BUNIONECTOMY  10/15/1986   carcinoid Right    Right lower lobectomy 2017   COLONOSCOPY     COLONOSCOPY W/ POLYPECTOMY     EYE SURGERY  5/14 ; 6/14   Cataract Surgery   JOINT REPLACEMENT     2002 left; 2009 right Hip, rt knee 2012   KNEE ARTHROPLASTY Right 10/15/2010   Darryle Long   LOBECTOMY Right 01/05/2016   Procedure: RIGHT LOWER LOBE LOBECTOMY;  Surgeon: Elspeth JAYSON Millers, MD;  Location: [facility] OR;  Service: Thoracic;  Laterality: Right;   MASTECTOMY PARTIAL / LUMPECTOMY W/ AXILLARY LYMPHADENECTOMY  04/05/1987   Right - Dr Neysa   RESECTION OF MEDIASTINAL MASS N/A 01/05/2016   Procedure: RESECTION OF PLEURAL MASS, right;  Surgeon: Elspeth JAYSON Millers, MD;  Location: [facility] OR;  Service: Thoracic;  Laterality: N/A;   TONSILLECTOMY     TOTAL HIP ARTHROPLASTY     TUBAL LIGATION Bilateral 10/15/1968   VAGINAL HYSTERECTOMY  10/16/1975   myomata   VIDEO ASSISTED THORACOSCOPY (VATS)/WEDGE RESECTION Right 01/05/2016   Procedure: RIGHT VIDEO ASSISTED THORACOSCOPY (VATS)/WEDGE RESECTION;  Surgeon: Elspeth JAYSON Millers, MD;  Location: [facility] OR;  Service: Thoracic;  Laterality: Right;   Social History:   reports that she has never smoked. She has never used smokeless tobacco. She reports that she does not drink alcohol  and does not use drugs.  Family History  Problem Relation Age of Onset   Colon cancer Mother    Colon cancer Father    Heart disease Father    Stomach cancer Neg Hx    Liver cancer Neg Hx    Esophageal cancer Neg Hx     Medications: Patient's Medications  New Prescriptions   No medications on file  Previous Medications   ACETAMINOPHEN  (TYLENOL ) 500 MG TABLET    Take 2 tablets (1,000 mg total) by mouth every 6 (six) hours as needed for mild pain.   APIXABAN  (ELIQUIS ) 2.5 MG TABS TABLET    Take 1 tablet (2.5 mg total) by mouth 2 (two) times daily.   CALCIUM   CARBONATE-VITAMIN D  600-400 MG-UNIT TABLET    Take 1 tablet by mouth daily.   CARVEDILOL  (COREG ) 3.125 MG TABLET    Take 1 tablet (3.125 mg total) by mouth 2 (two) times daily with a meal.   ERGOCALCIFEROL  (VITAMIN D2) 10 MCG (400 UNIT) TABS    Take 400 Units by mouth daily.   FEXOFENADINE  (ALLEGRA  ALLERGY) 180 MG TABLET    Take 1 tablet (180 mg total) by mouth daily.   FLUTICASONE  (FLONASE ) 50 MCG/ACT NASAL SPRAY    Place 2 sprays into both nostrils daily.   GABAPENTIN  (NEURONTIN ) 100 MG CAPSULE    Take 1 capsule (100 mg total) by mouth at bedtime.   LACTOSE  FREE NUTRITION (BOOST) LIQD    Take 237 mLs by mouth 2 (two) times daily between meals.   LOSARTAN  (COZAAR ) 25 MG TABLET    Take 1 tablet (25 mg total) by mouth daily.   OMEGA-3 FATTY ACIDS (FISH OIL) 1000 MG CAPS    Take 1 capsule by mouth daily.   PANTOPRAZOLE  (PROTONIX ) 40 MG TABLET    TAKE ONE TABLET BY MOUTH DAILY   POLYETHYLENE GLYCOL (MIRALAX  / GLYCOLAX ) 17 G PACKET    Take 17 g by mouth daily as needed for moderate constipation.   PROBIOTIC PRODUCT (ALIGN PO)    Take by mouth.   PROPYLENE GLYCOL (SYSTANE COMPLETE) 0.6 % SOLN    Place 1 drop into both eyes daily.   ROSUVASTATIN  (CRESTOR ) 10 MG TABLET    Take 1 tablet (10 mg total) by mouth at bedtime.   SIMETHICONE  (GAS-X) 80 MG CHEWABLE TABLET    Chew 1 tablet (80 mg total) by mouth every 6 (six) hours as needed for flatulence.   SPIRONOLACTONE  (ALDACTONE ) 25 MG TABLET    Take 0.5 tablet (12.5 mg total) by mouth daily.   TORSEMIDE  (DEMADEX ) 20 MG TABLET    Take 1 tablet (20 mg total) by mouth daily.  Modified Medications   No medications on file  Discontinued Medications   No medications on file    Physical Exam: Vitals:   04/24/24 0958  BP: 122/78  Pulse: (!) 58  Temp: (!) 97.5 F (36.4 C)  SpO2: 95%  Weight: 124 lb (56.2 kg)  Height: 4' 11 (1.499 m)   Body mass index is 25.04 kg/m. BP Readings from Last 3 Encounters:  04/24/24 122/78  01/17/24 116/78  11/01/23  128/78   Wt Readings from Last 3 Encounters:  04/24/24 124 lb (56.2 kg)  01/17/24 127 lb (57.6 kg)  11/01/23 128 lb 12.8 oz (58.4 kg)    Physical Exam Constitutional:      Appearance: Normal appearance.  HENT:     Head: Normocephalic and atraumatic.  Cardiovascular:     Rate and Rhythm: Normal rate and regular rhythm.  Pulmonary:     Effort: Pulmonary effort is normal. No respiratory distress.     Breath sounds: Rales (left lung base) present. No wheezing.  Abdominal:     General: Bowel sounds are normal. There is no distension.     Tenderness: There is no abdominal tenderness. There is no guarding or rebound.     Comments:    Musculoskeletal:        General: No swelling.     Comments: Left knee - no redness , no swelling No joint line tenderness  Neurological:     Mental Status: She is alert. Mental status is at baseline.     Motor: No weakness.     Labs reviewed: Basic Metabolic Panel: Recent Labs    11/05/23 0713 11/12/23 0723 11/26/23 0718  NA 143 135 133*  K 5.5* 4.1 4.7  CL 103 94* 93*  CO2 18* 33* 33*  GLUCOSE 71 79 89  BUN 20 18 22   CREATININE 1.14* 0.98* 1.10*  CALCIUM  9.6 9.4 9.6  MG 3.2*  --   --    Liver Function Tests: Recent Labs    11/05/23 0713 11/12/23 0723  AST 38* 25  ALT 10 13  BILITOT 1.3* 1.2  PROT 7.2 6.8   No results for input(s): LIPASE, AMYLASE in the last 8760 hours. No results for input(s): AMMONIA in the last 8760 hours. CBC: Recent  Labs    11/05/23 0713  WBC 3.8  NEUTROABS 2,850  HGB 11.5*  HCT 36.0  MCV 98.4  PLT 127*   Lipid Panel: Recent Labs    11/05/23 0715  CHOL 137  HDL 65  LDLCALC 58  TRIG 63  CHOLHDL 2.1   TSH: No results for input(s): TSH in the last 8760 hours. A1C: No results found for: HGBA1C  Assessment and Plan Assessment & Plan   1. Atrial fibrillation, chronic (HCC) (Primary) No signs of bleeding Cont with  Continue Eliquis  2.5 mg twice daily.  2. Essential  hypertension At goal  Cont with the same  3. Chronic diastolic congestive heart failure (HCC) Euvolemic on exam  Avoid salty foods - Basic Metabolic Panel  4. Gastroesophageal reflux disease, unspecified whether esophagitis present Stable Cont with prilosec  5. Generalized OA Take tylenol  prn for pain

## 2024-04-24 NOTE — Patient Instructions (Addendum)
  Please remember ALL appointments are at the [facility].   2. Please remember to get the following vaccines from your local pharmacy : Pneumococcal Vaccine , Zoster Vaccine , COVID-19 Vaccine   Please come for lab work Tuesday morning 7/15  at 7.30

## 2024-04-28 ENCOUNTER — Ambulatory Visit: Payer: Self-pay | Admitting: Sports Medicine

## 2024-04-28 LAB — BASIC METABOLIC PANEL WITH GFR
BUN/Creatinine Ratio: 18 (calc) (ref 6–22)
BUN: 20 mg/dL (ref 7–25)
CO2: 31 mmol/L (ref 20–32)
Calcium: 9.5 mg/dL (ref 8.6–10.4)
Chloride: 96 mmol/L — ABNORMAL LOW (ref 98–110)
Creat: 1.09 mg/dL — ABNORMAL HIGH (ref 0.60–0.95)
Glucose, Bld: 77 mg/dL (ref 65–99)
Potassium: 4.3 mmol/L (ref 3.5–5.3)
Sodium: 138 mmol/L (ref 135–146)
eGFR: 49 mL/min/1.73m2 — ABNORMAL LOW (ref >=60)

## 2024-04-29 ENCOUNTER — Other Ambulatory Visit: Payer: Self-pay | Admitting: Cardiovascular Disease

## 2024-05-24 ENCOUNTER — Other Ambulatory Visit: Payer: Self-pay | Admitting: Nurse Practitioner

## 2024-05-24 DIAGNOSIS — K219 Gastro-esophageal reflux disease without esophagitis: Secondary | ICD-10-CM

## 2024-06-05 ENCOUNTER — Encounter (HOSPITAL_COMMUNITY): Payer: Self-pay

## 2024-06-05 ENCOUNTER — Emergency Department (HOSPITAL_COMMUNITY)

## 2024-06-05 ENCOUNTER — Emergency Department (HOSPITAL_COMMUNITY)
Admission: EM | Admit: 2024-06-05 | Discharge: 2024-06-05 | Disposition: A | Discharge status: Home / Self Care | Attending: Emergency Medicine | Admitting: Emergency Medicine

## 2024-06-05 ENCOUNTER — Other Ambulatory Visit: Payer: Self-pay

## 2024-06-05 DIAGNOSIS — R109 Unspecified abdominal pain: Secondary | ICD-10-CM | POA: Insufficient documentation

## 2024-06-05 DIAGNOSIS — Z7901 Long term (current) use of anticoagulants: Secondary | ICD-10-CM | POA: Diagnosis not present

## 2024-06-05 DIAGNOSIS — M546 Pain in thoracic spine: Secondary | ICD-10-CM | POA: Diagnosis not present

## 2024-06-05 DIAGNOSIS — Z859 Personal history of malignant neoplasm, unspecified: Secondary | ICD-10-CM | POA: Insufficient documentation

## 2024-06-05 DIAGNOSIS — I700 Atherosclerosis of aorta: Secondary | ICD-10-CM | POA: Diagnosis not present

## 2024-06-05 DIAGNOSIS — M549 Dorsalgia, unspecified: Secondary | ICD-10-CM | POA: Diagnosis present

## 2024-06-05 LAB — COMPREHENSIVE METABOLIC PANEL WITH GFR
ALT: 11 U/L (ref 0–44)
AST: 26 U/L (ref 15–41)
Albumin: 3.8 g/dL (ref 3.5–5.0)
Alkaline Phosphatase: 53 U/L (ref 38–126)
Anion gap: 10 (ref 5–15)
BUN: 13 mg/dL (ref 8–23)
CO2: 27 mmol/L (ref 22–32)
Calcium: 9.2 mg/dL (ref 8.9–10.3)
Chloride: 97 mmol/L — ABNORMAL LOW (ref 98–111)
Creatinine, Ser: 0.88 mg/dL (ref 0.44–1.00)
GFR, Estimated: >60 mL/min (ref >60)
Glucose, Bld: 100 mg/dL — ABNORMAL HIGH (ref 70–99)
Potassium: 4 mmol/L (ref 3.5–5.1)
Sodium: 134 mmol/L — ABNORMAL LOW (ref 135–145)
Total Bilirubin: 1.5 mg/dL — ABNORMAL HIGH (ref 0.0–1.2)
Total Protein: 6.5 g/dL (ref 6.5–8.1)

## 2024-06-05 LAB — CBC WITH DIFFERENTIAL/PLATELET
Abs Immature Granulocytes: 0.02 K/uL (ref 0.00–0.07)
Basophils Absolute: 0 K/uL (ref 0.0–0.1)
Basophils Relative: 0 %
Eosinophils Absolute: 0 K/uL (ref 0.0–0.5)
Eosinophils Relative: 0 %
HCT: 36.2 % (ref 36.0–46.0)
Hemoglobin: 11.3 g/dL — ABNORMAL LOW (ref 12.0–15.0)
Immature Granulocytes: 1 %
Lymphocytes Relative: 10 %
Lymphs Abs: 0.4 K/uL — ABNORMAL LOW (ref 0.7–4.0)
MCH: 31.9 pg (ref 26.0–34.0)
MCHC: 31.2 g/dL (ref 30.0–36.0)
MCV: 102.3 fL — ABNORMAL HIGH (ref 80.0–100.0)
Monocytes Absolute: 0.3 K/uL (ref 0.1–1.0)
Monocytes Relative: 8 %
Neutro Abs: 3.2 K/uL (ref 1.7–7.7)
Neutrophils Relative %: 81 %
Platelets: 138 K/uL — ABNORMAL LOW (ref 150–400)
RBC: 3.54 MIL/uL — ABNORMAL LOW (ref 3.87–5.11)
RDW: 13.8 % (ref 11.5–15.5)
WBC: 3.9 K/uL — ABNORMAL LOW (ref 4.0–10.5)
nRBC: 0 % (ref 0.0–0.2)

## 2024-06-05 LAB — URINALYSIS, ROUTINE W REFLEX MICROSCOPIC
Bilirubin Urine: NEGATIVE
Glucose, UA: NEGATIVE mg/dL
Hgb urine dipstick: NEGATIVE
Ketones, ur: NEGATIVE mg/dL
Leukocytes,Ua: NEGATIVE
Nitrite: NEGATIVE
Protein, ur: 30 mg/dL — AB
Specific Gravity, Urine: 1.009 (ref 1.005–1.030)
pH: 9 — ABNORMAL HIGH (ref 5.0–8.0)

## 2024-06-05 LAB — LIPASE, BLOOD: Lipase: 43 U/L (ref 11–51)

## 2024-06-05 MED ORDER — KETOROLAC TROMETHAMINE 15 MG/ML IJ SOLN
15.0000 mg | Freq: Once | INTRAMUSCULAR | Status: AC
  Administered 2024-06-05: 15 mg via INTRAVENOUS
  Filled 2024-06-05: qty 1

## 2024-06-05 MED ORDER — CARVEDILOL 3.125 MG PO TABS: 3.1250 mg | ORAL_TABLET | Freq: Two times a day (BID) | ORAL | Status: DC

## 2024-06-05 MED ORDER — ACETAMINOPHEN 500 MG PO TABS: 1000.0000 mg | ORAL_TABLET | Freq: Once | ORAL | Status: DC

## 2024-06-05 MED ORDER — METHYLPREDNISOLONE 4 MG PO TBPK
ORAL_TABLET | ORAL | 0 refills | Status: DC
Start: 2024-06-05 — End: 2024-07-03

## 2024-06-05 MED ORDER — APIXABAN 2.5 MG PO TABS
2.5000 mg | ORAL_TABLET | Freq: Two times a day (BID) | ORAL | Status: DC
Start: 2024-06-05 — End: 2024-06-05
  Administered 2024-06-05: 2.5 mg via ORAL
  Filled 2024-06-05: qty 1

## 2024-06-05 MED ORDER — OXYCODONE HCL 5 MG PO TABS: 5.0000 mg | ORAL_TABLET | Freq: Once | ORAL | Status: DC

## 2024-06-05 MED ORDER — DICLOFENAC SODIUM 1 % EX GEL: 4.0000 g | Freq: Four times a day (QID) | CUTANEOUS | 0 refills | Status: DC

## 2024-06-05 MED ORDER — GABAPENTIN 100 MG PO CAPS: 100.0000 mg | ORAL_CAPSULE | Freq: Every day | ORAL | Status: DC

## 2024-06-05 MED ORDER — LOSARTAN POTASSIUM 50 MG PO TABS
25.0000 mg | ORAL_TABLET | Freq: Every day | ORAL | Status: DC
  Administered 2024-06-05: 25 mg via ORAL
  Filled 2024-06-05: qty 1

## 2024-06-05 NOTE — Discharge Instructions (Signed)
Your back pain is most likely due to a muscular strain.  There is been a lot of research on back pain, unfortunately the only thing that seems to really help is Tylenol and ibuprofen.  Relative rest is also important to not lift greater than 10 pounds bending or twisting at the waist.  Please follow-up with your family physician.  The other thing that really seems to benefit patients is physical therapy which your doctor may send you for.  Please return to the emergency department for new numbness or weakness to your arms or legs. Difficulty with urinating or urinating or pooping on yourself.  Also if you cannot feel toilet paper when you wipe or get a fever.   Take the steroids as prescribed.  Use the gel as prescribed Also take tylenol '1000mg'$ (2 extra strength) four times a day.

## 2024-06-05 NOTE — ED Provider Notes (Signed)
 [place] [facility] Provider Note   CSN: [id number] Arrival date & time: 06/05/24  1021     Patient presents with: Back Pain   [name] is a 89 y.o. female.   89 yo F with a chief complaints of right sided back pain.  This been going on for a few days now.  Seems to be worse with certain positions.  She denies trauma to the area denies cough congestion or fever.  Denies radiation of the pain.  Describes it as a gripping pain.  Has been taking Tylenol  at home but without significant improvement.  She denies any urinary symptoms.     Back Pain      Prior to Admission medications   Medication Sig Start Date End Date Taking? Authorizing Provider  diclofenac  Sodium (VOLTAREN ) 1 % GEL Apply 4 g topically 4 (four) times daily. 06/05/24  Yes Emil Share, DO  methylPREDNISolone  (MEDROL  DOSEPAK) 4 MG TBPK tablet Day 1: 8mg  before breakfast, 4 mg after lunch, 4 mg after supper, and 8 mg at bedtime Day 2: 4 mg before breakfast, 4 mg after lunch, 4 mg  after supper, and 8 mg  at bedtime Day 3:  4 mg  before breakfast, 4 mg  after lunch, 4 mg after supper, and 4 mg  at bedtime Day 4: 4 mg  before breakfast, 4 mg  after lunch, and 4 mg at bedtime Day 5: 4 mg  before breakfast and 4 mg at bedtime Day 6: 4 mg  before breakfast 06/05/24  Yes Amarissa Koerner, DO  acetaminophen  (TYLENOL ) 500 MG tablet Take 2 tablets (1,000 mg total) by mouth every 6 (six) hours as needed for mild pain. 01/17/16   Dwan Kyla HERO, PA-C  apixaban  (ELIQUIS ) 2.5 MG TABS tablet Take 1 tablet (2.5 mg total) by mouth 2 (two) times daily. 09/27/23   O'NealDarryle Ned, MD  Calcium  Carbonate-Vitamin D  600-400 MG-UNIT tablet Take 1 tablet by mouth daily.    [provider]  carvedilol  (COREG ) 3.125 MG tablet Take 1 tablet (3.125 mg total) by mouth 2 (two) times daily with a meal. 09/27/23   O'Neal, Darryle Ned, MD  fexofenadine  (ALLEGRA  ALLERGY) 180 MG tablet Take 1 tablet (180  mg total) by mouth daily. 07/31/23   Sherlynn Madden, MD  fluticasone  (FLONASE ) 50 MCG/ACT nasal spray Place 2 sprays into both nostrils daily. 07/31/23   Sherlynn Madden, MD  gabapentin  (NEURONTIN ) 100 MG capsule Take 1 capsule (100 mg total) by mouth at bedtime. 01/17/24   Sherlynn Madden, MD  lactose free nutrition (BOOST) LIQD Take 237 mLs by mouth 2 (two) times daily between meals.    [provider]  losartan  (COZAAR ) 25 MG tablet Take 1 tablet (25 mg total) by mouth daily. 11/06/23   Sherlynn Madden, MD  Omega-3 Fatty Acids (FISH OIL) 1000 MG CAPS Take 1 capsule by mouth daily.    [provider]  pantoprazole  (PROTONIX ) 40 MG tablet TAKE ONE TABLET BY MOUTH DAILY 05/25/24   Veludandi, Prashanthi, MD  polyethylene glycol (MIRALAX  / GLYCOLAX ) 17 g packet Take 17 g by mouth daily as needed for moderate constipation. 07/15/20   Mast, Man X, NP  Probiotic Product (ALIGN PO) Take by mouth.    [provider]  Propylene Glycol (SYSTANE COMPLETE) 0.6 % SOLN Place 1 drop into both eyes daily.    [provider]  rosuvastatin  (CRESTOR ) 10 MG tablet Take 1 tablet (10 mg total) by mouth  at bedtime. 09/27/23   O'NealDarryle Ned, MD  simethicone  (GAS-X) 80 MG chewable tablet Chew 1 tablet (80 mg total) by mouth every 6 (six) hours as needed for flatulence. 02/07/22   Cleotilde Garnette HERO, MD  spironolactone  (ALDACTONE ) 25 MG tablet Take 0.5 tablet (12.5 mg total) by mouth daily. 04/29/24   O'NealDarryle Ned, MD  torsemide  (DEMADEX ) 20 MG tablet Take 1 tablet (20 mg total) by mouth daily. 07/17/23   O'Neal, Darryle Ned, MD    Allergies: Aspirin    Review of Systems  Musculoskeletal:  Positive for back pain.    Updated Vital Signs BP (!) 169/85 (BP Location: Left Arm)   Pulse 75   Temp 98 F (36.7 C) (Oral)   Resp 18   Ht 4' 11 (1.499 m)   Wt 56 kg   LMP 10/16/1975 (Approximate)   SpO2 100%   BMI 24.94 kg/m   Physical Exam Vitals  and nursing note reviewed.  Constitutional:      General: She is not in acute distress.    Appearance: She is well-developed. She is not diaphoretic.  HENT:     Head: Normocephalic and atraumatic.  Eyes:     Pupils: Pupils are equal, round, and reactive to light.  Cardiovascular:     Rate and Rhythm: Normal rate and regular rhythm.     Heart sounds: No murmur heard.    No friction rub. No gallop.  Pulmonary:     Effort: Pulmonary effort is normal.     Breath sounds: No wheezing or rales.  Abdominal:     General: There is no distension.     Palpations: Abdomen is soft.     Tenderness: There is no abdominal tenderness.  Musculoskeletal:        General: Tenderness present.     Cervical back: Normal range of motion and neck supple.     Comments: Tenderness about the right CVA.  No obvious rash.  Skin:    General: Skin is warm and dry.  Neurological:     Mental Status: She is alert and oriented to person, place, and time.  Psychiatric:        Behavior: Behavior normal.     (all labs ordered are listed, but only abnormal results are displayed) Labs Reviewed  URINALYSIS, ROUTINE W REFLEX MICROSCOPIC - Abnormal; Notable for the following components:      Result Value   Color, Urine STRAW (*)    pH 9.0 (*)    Protein, ur 30 (*)    Bacteria, UA RARE (*)    All other components within normal limits  CBC WITH DIFFERENTIAL/PLATELET - Abnormal; Notable for the following components:   WBC 3.9 (*)    RBC 3.54 (*)    Hemoglobin 11.3 (*)    MCV 102.3 (*)    Platelets 138 (*)    Lymphs Abs 0.4 (*)    All other components within normal limits  COMPREHENSIVE METABOLIC PANEL WITH GFR - Abnormal; Notable for the following components:   Sodium 134 (*)    Chloride 97 (*)    Glucose, Bld 100 (*)    Total Bilirubin 1.5 (*)    All other components within normal limits  LIPASE, BLOOD    EKG: None  Radiology: CT L-SPINE NO CHARGE Result Date: 06/05/2024 CLINICAL DATA:  Flank pain.  EXAM: CT LUMBAR SPINE WITHOUT CONTRAST TECHNIQUE: Multidetector CT imaging of the lumbar spine was performed without intravenous contrast administration. Multiplanar CT image reconstructions were  also generated. RADIATION DOSE REDUCTION: This exam was performed according to the departmental dose-optimization program which includes automated exposure control, adjustment of the mA and/or kV according to patient size and/or use of iterative reconstruction technique. COMPARISON:  June 02, 2019.  June 02, 2016. FINDINGS: Segmentation: 5 lumbar type vertebrae. Alignment: Moderate dextroscoliosis of lumbar spine is noted. Lateral subluxation of L3 on L4 and L4 and L5 is noted. Mild grade 1 anterolisthesis of L4-5 is noted secondary to posterior facet joint hypertrophy. Mild grade 1 retrolisthesis of L2-3 is noted secondary to severe degenerative disc disease at this level. Vertebrae: Old T12 fracture is noted.  No acute fracture is noted. Paraspinal and other soft tissues: Negative. Disc levels: Severe degenerative disc disease is noted at L2-3, L3-4 and L4-5. Moderate degenerative disc disease is noted at T12-L1 and L1-2. IMPRESSION: 1. No acute abnormality seen in the lumbar spine. 2. Moderate dextroscoliosis of lumbar spine is noted. 3. Severe multilevel degenerative disc disease is noted. 4. Old T12 fracture is noted. Electronically Signed   By: Lynwood Landy Raddle M.D.   On: 06/05/2024 13:06   DG Chest Port 1 View Result Date: 06/05/2024 CLINICAL DATA:  r flank pain, sob EXAM: PORTABLE CHEST - 1 VIEW COMPARISON:  December 05, 2022 FINDINGS: No new airspace consolidation or pneumothorax. Chronic blunting of the right costophrenic sulcus, likely small pleural effusion. Moderate cardiomegaly. Tortuous aorta with aortic atherosclerosis. No acute fracture or destructive lesions. Levocurvature of the thoracolumbar spine with multilevel osteophytosis. Surgical clips in the right breast/axilla. Osteoarthritis of the right  shoulder with likely chronic rotator cuff tear. IMPRESSION: Unchanged small right pleural effusion. Otherwise, no acute cardiopulmonary abnormality. Electronically Signed   By: Rogelia Myers M.D.   On: 06/05/2024 12:46   CT Renal Stone Study Result Date: 06/05/2024 CLINICAL DATA:  Flank pain. EXAM: CT ABDOMEN AND PELVIS WITHOUT CONTRAST TECHNIQUE: Multidetector CT imaging of the abdomen and pelvis was performed following the standard protocol without IV contrast. RADIATION DOSE REDUCTION: This exam was performed according to the departmental dose-optimization program which includes automated exposure control, adjustment of the mA and/or kV according to patient size and/or use of iterative reconstruction technique. COMPARISON:  June 02, 2019. FINDINGS: Lower chest: Probable small loculated right pleural effusion. Hepatobiliary: Stable right hepatic cyst. No cholelithiasis or biliary dilatation. Pancreas: Unremarkable. No pancreatic ductal dilatation or surrounding inflammatory changes. Spleen: Normal in size without focal abnormality. Adrenals/Urinary Tract: Adrenal glands and kidneys appear normal. No hydronephrosis or renal obstruction is noted. Urinary bladder is not well visualized due to scatter artifact arising from bilateral hip arthroplasties. Stomach/Bowel: The stomach is unremarkable. There is no evidence of bowel obstruction or inflammation. Vascular/Lymphatic: Aortic atherosclerosis. No enlarged abdominal or pelvic lymph nodes. Reproductive: Status post hysterectomy. No adnexal masses. Other: No ascites is noted.  No significant hernia is noted. Musculoskeletal: Status post bilateral total hip arthroplasties. Severe multilevel degenerative disc disease is noted in lumbar spine. No acute osseous abnormality. IMPRESSION: 1. Probable small loculated right pleural effusion. 2. No acute abnormality seen in the abdomen or pelvis. 3. Aortic atherosclerosis. Aortic Atherosclerosis (ICD10-I70.0).  Electronically Signed   By: Lynwood Landy Raddle M.D.   On: 06/05/2024 12:43     Procedures   Medications Ordered in the ED  apixaban  (ELIQUIS ) tablet 2.5 mg (2.5 mg Oral Given 06/05/24 1222)  carvedilol  (COREG ) tablet 3.125 mg (has no administration in time range)  gabapentin  (NEURONTIN ) capsule 100 mg (has no administration in time range)  losartan  (COZAAR ) tablet 25  mg (25 mg Oral Given 06/05/24 1222)  acetaminophen  (TYLENOL ) tablet 1,000 mg (has no administration in time range)  oxyCODONE  (Oxy IR/ROXICODONE ) immediate release tablet 5 mg (has no administration in time range)  ketorolac  (TORADOL ) 15 MG/ML injection 15 mg (15 mg Intravenous Given 06/05/24 1222)                                    Medical Decision Making Amount and/or Complexity of Data Reviewed Labs: ordered. Radiology: ordered.  Risk OTC drugs. Prescription drug management.   89 yo F with a chief complaints of right flank pain.  This been going on for a few days now.  Sounds musculoskeletal by history and physical.  Does have a history of cancer.  Has some shortness of breath with this as well.  Will obtain a chest x-ray CT stone study reformats of the L-spine.  Blood work.  Reassess.  No acute anemia, no significant electrolyte abnormalities.  Chest x-ray independently interpreted by me with right sided pleural effusion.  CT stone study without obvious acute intra-abdominal pathology.  On record review the patient has had a complicated right-sided pleural effusion since at least 2023  CT L-spine without obvious acute fracture.  UA without obvious infection.  I discussed results with the patient and family.  She would like to go home at this time.  PCP follow-up.  1:35 PM:  I have discussed the diagnosis/risks/treatment options with the patient.  Evaluation and diagnostic testing in the emergency department does not suggest an emergent condition requiring admission or immediate intervention beyond what has been  performed at this time.  They will follow up with PCP. We also discussed returning to the ED immediately if new or worsening sx occur. We discussed the sx which are most concerning (e.g., sudden worsening pain, fever, inability to tolerate by mouth, cauda equina s/sx) that necessitate immediate return. Medications administered to the patient during their visit and any new prescriptions provided to the patient are listed below.  Medications given during this visit Medications  apixaban  (ELIQUIS ) tablet 2.5 mg (2.5 mg Oral Given 06/05/24 1222)  carvedilol  (COREG ) tablet 3.125 mg (has no administration in time range)  gabapentin  (NEURONTIN ) capsule 100 mg (has no administration in time range)  losartan  (COZAAR ) tablet 25 mg (25 mg Oral Given 06/05/24 1222)  acetaminophen  (TYLENOL ) tablet 1,000 mg (has no administration in time range)  oxyCODONE  (Oxy IR/ROXICODONE ) immediate release tablet 5 mg (has no administration in time range)  ketorolac  (TORADOL ) 15 MG/ML injection 15 mg (15 mg Intravenous Given 06/05/24 1222)     The patient appears reasonably screen and/or stabilized for discharge and I doubt any other medical condition or other [facility] requiring further screening, evaluation, or treatment in the ED at this time prior to discharge.       Final diagnoses:  Acute right-sided thoracic back pain    ED Discharge Orders          Ordered    methylPREDNISolone  (MEDROL  DOSEPAK) 4 MG TBPK tablet        06/05/24 1333    diclofenac  Sodium (VOLTAREN ) 1 % GEL  4 times daily        06/05/24 1333               Emil Share, DO 06/05/24 1335

## 2024-06-05 NOTE — ED Notes (Signed)
 Please be mindful if using PIV for blood draw - patient has limited vasculature and what she does have are fragile due to chemo history and age. Blood return may not continue due to catheter size and location.   Valentina Alcoser R Jin Capote, RN

## 2024-06-05 NOTE — ED Notes (Signed)
 Patient transported to CT

## 2024-06-05 NOTE — ED Triage Notes (Addendum)
 Pt to ED via EMS from independent living with c/o right lower back pain since Monday. Pt describes pain as gripping. Pt denies urinary issues. Pt ambulates with walker and ambulated to restroom with assistance upon arrival to ED. Pt A&Ox4. Pt denies known injury.

## 2024-06-11 ENCOUNTER — Emergency Department (HOSPITAL_BASED_OUTPATIENT_CLINIC_OR_DEPARTMENT_OTHER)

## 2024-06-11 ENCOUNTER — Emergency Department (HOSPITAL_BASED_OUTPATIENT_CLINIC_OR_DEPARTMENT_OTHER)
Admission: EM | Admit: 2024-06-11 | Discharge: 2024-06-11 | Disposition: A | Discharge status: Home / Self Care | Attending: Emergency Medicine | Admitting: Emergency Medicine

## 2024-06-11 ENCOUNTER — Other Ambulatory Visit: Payer: Self-pay

## 2024-06-11 ENCOUNTER — Encounter (HOSPITAL_BASED_OUTPATIENT_CLINIC_OR_DEPARTMENT_OTHER): Payer: Self-pay | Admitting: Emergency Medicine

## 2024-06-11 DIAGNOSIS — Z853 Personal history of malignant neoplasm of breast: Secondary | ICD-10-CM | POA: Insufficient documentation

## 2024-06-11 DIAGNOSIS — S8001XA Contusion of right knee, initial encounter: Secondary | ICD-10-CM | POA: Diagnosis not present

## 2024-06-11 DIAGNOSIS — W19XXXA Unspecified fall, initial encounter: Secondary | ICD-10-CM | POA: Diagnosis not present

## 2024-06-11 DIAGNOSIS — S50811A Abrasion of right forearm, initial encounter: Secondary | ICD-10-CM | POA: Insufficient documentation

## 2024-06-11 DIAGNOSIS — Z23 Encounter for immunization: Secondary | ICD-10-CM | POA: Insufficient documentation

## 2024-06-11 DIAGNOSIS — N189 Chronic kidney disease, unspecified: Secondary | ICD-10-CM | POA: Insufficient documentation

## 2024-06-11 DIAGNOSIS — Z79899 Other long term (current) drug therapy: Secondary | ICD-10-CM | POA: Insufficient documentation

## 2024-06-11 DIAGNOSIS — Z7901 Long term (current) use of anticoagulants: Secondary | ICD-10-CM | POA: Diagnosis not present

## 2024-06-11 DIAGNOSIS — I509 Heart failure, unspecified: Secondary | ICD-10-CM | POA: Insufficient documentation

## 2024-06-11 DIAGNOSIS — I4891 Unspecified atrial fibrillation: Secondary | ICD-10-CM | POA: Insufficient documentation

## 2024-06-11 DIAGNOSIS — S0083XA Contusion of other part of head, initial encounter: Secondary | ICD-10-CM | POA: Insufficient documentation

## 2024-06-11 DIAGNOSIS — S0990XA Unspecified injury of head, initial encounter: Secondary | ICD-10-CM | POA: Diagnosis present

## 2024-06-11 MED ORDER — TETANUS-DIPHTH-ACELL PERTUSSIS 5-2.5-18.5 LF-MCG/0.5 IM SUSY
0.5000 mL | PREFILLED_SYRINGE | Freq: Once | INTRAMUSCULAR | Status: AC
  Administered 2024-06-11: 0.5 mL via INTRAMUSCULAR
  Filled 2024-06-11: qty 0.5

## 2024-06-11 NOTE — ED Provider Notes (Signed)
 Supervised resident visit.  Patient here after fall at her independent living.  She fell forward hit her forehead.  She has bruising and hematoma to this.  Some some pain to her right wrist and hand.  She had a skin tear in this area that was already placed with Steri-Strips by nursing staff at her independent living.  Still some some pain to her right knee.  She is on Eliquis .  Overall mechanical fall.  She is very well-appearing.  CT scan of the head and neck were obtained which were unremarkable except for hematoma to her forehead.  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  There could be some suspicion for ligament injury in the scapholunate area but I suspect that this is likely chronic given her exam.  But will be conservative and place her in a removable thumb spica splint and have her follow-up with hand.  Tetanus shot was updated.  She is neurovascular mostly intact otherwise.  Discharged in good condition.  Follow-up with primary care and hand.  This chart was dictated using voice recognition software.  Despite best efforts to proofread,  errors can occur which can change the documentation meaning.    Ruthe Cornet, DO 06/11/24 1220

## 2024-06-11 NOTE — Discharge Instructions (Addendum)
 You have a bruise on your forehead but no evidence of bleeding inside of your skull or brain.  You can continue taking your Eliquis  but please follow-up closely with your primary care physician to revisit this.  You have very severe arthritis in your hands which may have caused a sprain and some abnormalities in your joints.  Please wear a brace and follow-up with a hand surgeon especially if you develop pain.  Use over-the-counter Tylenol  for pain control.  Seek medical attention if your symptoms worsen or if you develop any chest pain, difficulty breathing, lightheadedness or dizziness, worsening bruising or bleeding or passing out

## 2024-06-11 NOTE — ED Provider Notes (Signed)
 [place] [facility]MENT AT [facility] Provider Note   CSN: [id number] Arrival date & time: 06/11/24  1017     Patient presents with: [place]   [name] is a 89 y.o. female.   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  Reports earlier today she was bending over and fell forward resulting in bruising and swelling of her forehead and skin tear to right wrist and index finger.  Also having right knee pain. Denies loss of consciousness Currently endorses some mild soreness in her forehead, right wrist, and right knee  Reports she did not take her Eliquis  or any other medications today due to her fall.  Has otherwise been compliant with her medications.  Denies any chest pain, shortness of breath, palpitations, dizziness, lightheadedness, recent fevers or illness, numbness or weakness in arms or legs, facial droop or speech difficulty, nausea or vomiting.  She uses a walker to get around at home.  Currently does not feel like she needs anything for pain.    Fall Pertinent negatives include no chest pain, no abdominal pain and no shortness of breath.        Prior to Admission medications   Medication Sig Start Date End Date Taking? Authorizing Provider  amoxicillin  (AMOXIL ) 500 MG capsule Take by mouth. 05/18/24  Yes [provider]  acetaminophen  (TYLENOL ) 500 MG tablet Take 2 tablets (1,000 mg total) by mouth every 6 (six) hours as needed for mild pain. 01/17/16   Dwan Kyla HERO, PA-C  apixaban  (ELIQUIS ) 2.5 MG TABS tablet Take 1 tablet (2.5 mg total) by mouth 2 (two) times daily. 09/27/23   O'NealDarryle Ned, MD  Calcium  Carbonate-Vitamin D  600-400 MG-UNIT tablet Take 1 tablet by mouth daily.    [provider]  carvedilol  (COREG ) 3.125 MG tablet Take 1 tablet (3.125 mg total) by mouth 2 (two) times daily with a meal. 09/27/23   O'Neal, Darryle Ned, MD  diclofenac  Sodium (VOLTAREN ) 1 % GEL Apply 4 g topically 4 (four) times daily. 06/05/24   Emil Share, DO  fexofenadine  (ALLEGRA  ALLERGY) 180 MG tablet Take 1 tablet (180 mg total) by mouth daily. 07/31/23   Sherlynn Madden, MD  fluticasone  (FLONASE ) 50 MCG/ACT nasal spray Place 2 sprays into both nostrils daily. 07/31/23   Sherlynn Madden, MD  gabapentin  (NEURONTIN ) 100 MG capsule Take 1 capsule (100 mg total) by mouth at bedtime. 01/17/24   Sherlynn Madden, MD  lactose free nutrition (BOOST) LIQD Take 237 mLs by mouth 2 (two) times daily between meals.    [provider]  losartan  (COZAAR ) 25 MG tablet Take 1 tablet (25 mg total) by mouth daily. 11/06/23   Sherlynn Madden, MD  methylPREDNISolone  (MEDROL  DOSEPAK) 4 MG TBPK tablet Day 1: 8mg  before breakfast, 4 mg after lunch, 4 mg after supper, and 8 mg at bedtime Day 2: 4 mg before breakfast, 4 mg after lunch, 4 mg  after supper, and 8 mg  at bedtime Day 3:  4 mg  before breakfast, 4 mg  after lunch, 4 mg after supper, and 4 mg  at bedtime Day 4: 4 mg  before breakfast, 4 mg  after lunch, and 4 mg at bedtime Day 5: 4 mg  before breakfast and 4 mg at bedtime Day 6: 4  mg  before breakfast 06/05/24   Emil Share, DO  Omega-3 Fatty Acids (FISH OIL) 1000 MG CAPS Take 1 capsule by mouth daily.    [provider]  pantoprazole  (PROTONIX ) 40 MG tablet TAKE ONE TABLET BY MOUTH DAILY 05/25/24   Veludandi, Prashanthi, MD  polyethylene glycol (MIRALAX  / GLYCOLAX ) 17 g packet Take 17 g by mouth daily as needed for moderate constipation. 07/15/20   Mast, Man X, NP  Probiotic Product (ALIGN PO) Take by mouth.    [provider]  Propylene Glycol (SYSTANE COMPLETE) 0.6 % SOLN Place 1 drop into both eyes daily.    [provider]  rosuvastatin  (CRESTOR ) 10 MG tablet  Take 1 tablet (10 mg total) by mouth at bedtime. 09/27/23   O'NealDarryle Ned, MD  simethicone  (GAS-X) 80 MG chewable tablet Chew 1 tablet (80 mg total) by mouth every 6 (six) hours as needed for flatulence. 02/07/22   Cleotilde Garnette HERO, MD  spironolactone  (ALDACTONE ) 25 MG tablet Take 0.5 tablet (12.5 mg total) by mouth daily. 04/29/24   O'NealDarryle Ned, MD  torsemide  (DEMADEX ) 20 MG tablet Take 1 tablet (20 mg total) by mouth daily. 07/17/23   O'Neal, Darryle Ned, MD    Allergies: Aspirin    Review of Systems  Constitutional:  Negative for fever.  Respiratory:  Negative for shortness of breath.   Cardiovascular:  Negative for chest pain and palpitations.  Gastrointestinal:  Negative for abdominal pain, nausea and vomiting.  Genitourinary:  Negative for dysuria.  Musculoskeletal:  Negative for back pain and neck pain.  Neurological:  Negative for dizziness, weakness and numbness.    Updated Vital Signs BP (!) 158/87 (BP Location: Left Arm)   Pulse (!) 107   Temp 98.6 F (37 C)   Resp 16   Ht 4' 11 (1.499 m)   Wt 53.5 kg   LMP 10/16/1975 (Approximate)   SpO2 97%   BMI 23.83 kg/m   Physical Exam Constitutional:      General: She is not in acute distress. HENT:     Head: Normocephalic.     Comments: Superficial abrasion noted to forehead with some slight ecchymosis    Mouth/Throat:     Pharynx: Oropharynx is clear.  Eyes:     Extraocular Movements: Extraocular movements intact.     Pupils: Pupils are equal, round, and reactive to light.  Cardiovascular:     Rate and Rhythm: Normal rate and regular rhythm.     Heart sounds: Normal heart sounds. No murmur heard. Pulmonary:     Effort: Pulmonary effort is normal. No respiratory distress.     Breath sounds: Normal breath sounds.  Abdominal:     General: Abdomen is flat. There is no distension.     Palpations: Abdomen is soft.     Tenderness: There is no abdominal tenderness.  Musculoskeletal:     Cervical  back: Normal range of motion and neck supple. No tenderness.     Comments: R lower forearm with bandage applied. 2+ radial pulse and normal grip strength and sensation. R knee with small area of ecchymosis and tenderness anteriorly. Normal ROM. NVI distally in RLE.  Skin:    Findings: Bruising present.     Comments: Scattered bruises  Neurological:     General: No focal deficit present.     Mental Status: She is alert. Mental status is at baseline.     Cranial Nerves: Cranial nerves 2-12 are intact. No cranial nerve deficit.  Sensory: No sensory deficit.     Motor: No weakness.     Coordination: Finger-Nose-Finger Test and Heel to [employer] normal.     (all labs ordered are listed, but only abnormal results are displayed) Labs Reviewed - No data to display  EKG: None  Radiology: DG Wrist Complete Right Result Date: 06/11/2024 CLINICAL DATA:  Wrist pain EXAM: RIGHT WRIST - COMPLETE 4 VIEW COMPARISON:  None Available. FINDINGS: Moderate to severe osteoarthritis at the first carpometacarpal articulation. Prominent degenerative arthropathy at the thumb MCP joint with mild associated subluxation. Suspected geode in the distal pole of the scaphoid. Blunted ulnar styloid. Bony demineralization. Atherosclerotic calcifications noted. The scapholunate angle is near 90 degrees, raising suspicion for a scapholunate ligament tear potentially with some dorsal intercalated segmental instability. IMPRESSION: 1. Moderate to severe osteoarthritis at the first carpometacarpal articulation. 2. Prominent degenerative arthropathy at the thumb MCP joint with mild associated subluxation. 3. The scapholunate angle is near 90 degrees, raising suspicion for a scapholunate ligament tear potentially with some dorsal intercalated segmental instability. 4. Bony demineralization. Electronically Signed   By: Ryan Salvage M.D.   On: 06/11/2024 12:10   DG Knee Complete 4 Views Right Result Date: 06/11/2024 CLINICAL  DATA:  Knee pain EXAM: RIGHT KNEE - COMPLETE 4+ VIEW COMPARISON:  MRI 01/27/2004 FINDINGS: Bony demineralization. Total knee prosthesis without abnormal lucency along the bone methacrylate or methacrylate-metal interfaces. No fracture or acute bony findings. No knee effusion. Atherosclerosis. IMPRESSION: 1. Total knee prosthesis without complicating feature. 2. Bony demineralization. 3. Atherosclerosis. Electronically Signed   By: Ryan Salvage M.D.   On: 06/11/2024 12:06   DG Hand Complete Right Result Date: 06/11/2024 CLINICAL DATA:  Hand pain EXAM: RIGHT HAND - COMPLETE 3+ VIEW COMPARISON:  None Available. FINDINGS: Interphalangeal articular space narrowing with substantial erosive components in several joints including the interphalangeal joints of the middle finger, suspected chronic erosions along the distal interphalangeal joint of the small finger, and spurring and moderate subluxation at the thumb MTP joint. Advanced degenerative arthropathy at the first carpometacarpal articulation. Appearance compatible with erosive arthropathy, potentially erosive osteoarthritis or rheumatoid arthritis. IMPRESSION: 1. Erosive arthropathy in the hand and wrist, erosive osteoarthritis favored over rheumatoid arthritis. 2. Moderate subluxation at the thumb MTP joint. 3. Advanced degenerative arthropathy at the first carpometacarpal articulation. Electronically Signed   By: Ryan Salvage M.D.   On: 06/11/2024 12:04   CT Head Wo Contrast Result Date: 06/11/2024 EXAM: CT HEAD WITHOUT CONTRAST 06/11/2024 11:30:00 AM TECHNIQUE: CT of the head was performed without the administration of intravenous contrast. Automated exposure control, iterative reconstruction, and/or weight based adjustment of the mA/kV was utilized to reduce the radiation dose to as low as reasonably achievable. COMPARISON: None available. CLINICAL HISTORY: Polytrauma, blunt. Pt arrives from Ind living at [place], bib son, Pt endorses fall  forward after bending over today. Pt takes eliquis , did not take today. Bruising, swelling noted to forehead. Denies loc. Skin tear to RT wrist and index finger. Pt c/o RT knee pain. FINDINGS: BRAIN AND VENTRICLES: No acute hemorrhage. Gray-white differentiation is preserved. No hydrocephalus. No extra-axial collection. No mass effect or midline shift. There is age-related atrophy and mild periventricular white matter disease. ORBITS: The patient is status post bilateral lens replacement. SINUSES: No acute abnormality. SOFT TISSUES AND SKULL: There is a right frontal subgaleal hematoma. No skull fracture. IMPRESSION: 1. No acute intracranial abnormality. 2. Right frontal subgaleal hematoma. Electronically signed by: Evalene Coho MD 06/11/2024 11:39 AM EDT RP Workstation: [id number]  CT Cervical Spine Wo Contrast Result Date: 06/11/2024 EXAM: CT CERVICAL SPINE WITHOUT CONTRAST 06/11/2024 11:30:00 AM TECHNIQUE: CT of the cervical spine was performed without the administration of intravenous contrast. Multiplanar reformatted images are provided for review. Automated exposure control, iterative reconstruction, and/or weight based adjustment of the mA/kV was utilized to reduce the radiation dose to as low as reasonably achievable. COMPARISON: None available. CLINICAL HISTORY: Polytrauma, blunt. Pt arrives from Ind living at [place], bib son, Pt endorses fall forward after bending over today. Pt takes eliquis , did not take today. Bruising, swelling noted to forehead. Denies loc. Skin tear to RT wrist and index finger. Pt c/o RT knee pain. FINDINGS: CERVICAL SPINE: BONES AND ALIGNMENT: No acute fracture. There is mild levoscoliosis of the cervicothoracic spine. DEGENERATIVE CHANGES: There is diffuse chronic degenerative disc disease throughout the cervical spine and there is slight degenerative anterolisthesis at C7-T1. SOFT TISSUES: No prevertebral soft tissue swelling. There are calcifications within the carotid  bulbs. IMPRESSION: 1. No acute abnormality of the cervical spine related to the reported polytrauma. 2. Diffuse chronic degenerative disc disease throughout the cervical spine with slight degenerative anterolisthesis at C7-T1. 3. Mild levoscoliosis of the cervicothoracic spine. 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  Reports earlier today she was bending over and fell forward resulting in bruising and swelling of her forehead and skin tear to right wrist and index finger and R knee pain Afebrile, HDS on room air, no neurologic deficits on exam Does have abrasions on forehead, R forearm, and bruise on R knee with tenderness to palpation in these areas  Obtain CT head/C-spine given age and head injury on blood thinners Obtain radiographs of right wrist and hand and knee   12:25 PM Imaging shows soft tissue hematoma of forehead but no intracranial bleed. Also has significant arthritis and ?ligament tear in R hand/wrist but suspect this is likely chronic given nontender on exam with normal grip strength. Otherwise no acute abnormalities or fractures. Will place in brace and provide f/u information for ortho hand specialist. Ok to continue eliquis  but recommend PCP f/u to discuss. Patient feels comfortable with discharge. Discussed return precautions. Pain control with OTC tylenol .   Amount and/or Complexity of Data Reviewed Radiology: ordered.  Risk Prescription drug management.       Final diagnoses:  Traumatic hematoma of forehead,  initial encounter  Fall, initial encounter    ED Discharge Orders     None          Romelle Booty, MD 06/11/24 1228    Ruthe Cornet, DO 06/11/24 1240

## 2024-06-11 NOTE — ED Notes (Signed)
Patient transported to X-ray & CT °

## 2024-06-11 NOTE — ED Triage Notes (Signed)
 Pt arrives from Ind living at [place], bib son, Pt endorses fall forward after bending over today. Pt takes eliquis , did not take today. Bruising, swelling noted to forehead. Denies loc. Skin tear to RT wrist and index finger. Pt c/o RT knee pain.

## 2024-06-11 NOTE — ED Notes (Signed)
 Pt refused thumb spica/brace. Discharge instructions and follow up with ortho reviewed and explained, pt verbalized understanding and had no further questions on d/c.

## 2024-06-30 ENCOUNTER — Other Ambulatory Visit: Payer: Self-pay | Admitting: Cardiovascular Disease

## 2024-07-03 ENCOUNTER — Non-Acute Institutional Stay (INDEPENDENT_AMBULATORY_CARE_PROVIDER_SITE_OTHER): Admitting: Sports Medicine

## 2024-07-03 ENCOUNTER — Encounter: Payer: Self-pay | Admitting: Sports Medicine

## 2024-07-03 VITALS — BP 118/76 | HR 62 | Temp 97.7°F | Ht 59.0 in

## 2024-07-03 DIAGNOSIS — I482 Chronic atrial fibrillation, unspecified: Secondary | ICD-10-CM

## 2024-07-03 DIAGNOSIS — L539 Erythematous condition, unspecified: Secondary | ICD-10-CM

## 2024-07-03 DIAGNOSIS — I5032 Chronic diastolic (congestive) heart failure: Secondary | ICD-10-CM

## 2024-07-03 NOTE — Patient Instructions (Signed)
 Take torsemide  to 10 mg for 5 days and if you do not see any difference in dizziness then go back to 20 mg daily

## 2024-07-03 NOTE — Progress Notes (Signed)
 Careteam: Patient Care Team: Sherlynn Madden, MD as PCP - General (Internal Medicine) O'Neal, Darryle Ned, MD as PCP - Cardiology (Cardiology) Cleotilde Ronal RAMAN, MD (Gynecology) Melodye Coy, MD (Inactive) (Hematology and Oncology) Celestia Agent, MD (Inactive) (Gastroenterology) Mast, Man X, NP as Nurse Practitioner (Internal Medicine)  PLACE OF SERVICE:  [facility]  Advanced Directive information    Allergies  Allergen Reactions   Aspirin Rash    Chief Complaint  Patient presents with   Acute Visit    Patient had a fall August 28th after getting out the shower and she was drying her legs and fell. Patient has a skin abrasion on her right arm and a black eye. Patient stated she feels okay today but is a little shaky at time. Sometimes pt has slight dizziness since the fall.  Cellulitis in right arm due to no lymph nodes - masectomy      Discussed the use of AI scribe software for clinical note transcription with the patient, who gave verbal consent to proceed.  History of Present Illness  [name] is an 89 year old female who presents with concerns of a potentially infected skin tear.  She experienced a skin tear after a fall last month. Initially, Steri-Strips were applied, but they irritated her skin, leading to their removal. The wound has been red since the fall, with itchiness but no significant drainage, warmth, or pain. No fever, chills, or lymph node swelling. She has a history of cellulitis and is concerned about the potential for infection, noting that she develops cellulitis quickly.  She experiences dizziness and shakiness, which began after the fall. The dizziness occurs intermittently, both when standing and sitting. She monitors her water intake due to a tendency for low sodium levels.  She reports swelling in her legs, particularly at night, and uses compression stockings. She sleeps with her head elevated and uses oxygen at night. No chest pain,  palpitations, or significant fatigue.  She experiences occasional bloating but has regular bowel movements without blood in the stool or urine.  Her current medications include losartan  25 mg, spironolactone , Coreg , Crestor , and torsemide  20 mg. She previously took prednisone but stopped due to shakiness.    Review of Systems:  Review of Systems  Constitutional:  Negative for chills and fever.  HENT:  Negative for sore throat.   Respiratory:  Negative for cough, sputum production and shortness of breath.   Cardiovascular:  Negative for chest pain, palpitations and leg swelling.  Gastrointestinal:  Negative for abdominal pain, heartburn and nausea.  Genitourinary:  Negative for dysuria, frequency and hematuria.  Musculoskeletal:  Negative for falls and myalgias.  Skin:         4x4 cm round area with erythema, no exudates No tenderness No warmth   Neurological:  Positive for dizziness.   Negative unless indicated in HPI.   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   Right lower lobectomy   Diverticulitis 1999   Femur fracture (HCC) 03/2013   left, non weight bearing for 2 1/2 weeks   GERD (gastroesophageal reflux disease)    Hemorrhoids 1991   HTN (hypertension)    Lymphedema of upper extremity    right arm   Osteoporosis    PONV (postoperative  nausea and vomiting)    Urinary, incontinence, stress female    Vitamin D  deficiency disease    Past Surgical History:  Procedure Laterality Date   APPENDECTOMY     BREAST BIOPSY  05/04/1987   Right Dr Neysa   BREAST IMPLANT REMOVAL Right 06/05/2010   BUNIONECTOMY  10/15/1986   carcinoid Right    Right lower lobectomy 2017   COLONOSCOPY     COLONOSCOPY W/ POLYPECTOMY     EYE SURGERY  5/14 ; 6/14   Cataract Surgery   JOINT  REPLACEMENT     2002 left; 2009 right Hip, rt knee 2012   KNEE ARTHROPLASTY Right 10/15/2010   Darryle Long   LOBECTOMY Right 01/05/2016   Procedure: RIGHT LOWER LOBE LOBECTOMY;  Surgeon: Elspeth JAYSON Millers, MD;  Location: [facility] OR;  Service: Thoracic;  Laterality: Right;   MASTECTOMY PARTIAL / LUMPECTOMY W/ AXILLARY LYMPHADENECTOMY  04/05/1987   Right - Dr Neysa   RESECTION OF MEDIASTINAL MASS N/A 01/05/2016   Procedure: RESECTION OF PLEURAL MASS, right;  Surgeon: Elspeth JAYSON Millers, MD;  Location: [facility] OR;  Service: Thoracic;  Laterality: N/A;   TONSILLECTOMY     TOTAL HIP ARTHROPLASTY     TUBAL LIGATION Bilateral 10/15/1968   VAGINAL HYSTERECTOMY  10/16/1975   myomata   VIDEO ASSISTED THORACOSCOPY (VATS)/WEDGE RESECTION Right 01/05/2016   Procedure: RIGHT VIDEO ASSISTED THORACOSCOPY (VATS)/WEDGE RESECTION;  Surgeon: Elspeth JAYSON Millers, MD;  Location: [facility] OR;  Service: Thoracic;  Laterality: Right;   Social History:   reports that she has never smoked. She has never used smokeless tobacco. She reports that she does not drink alcohol  and does not use drugs.  Family History  Problem Relation Age of Onset   Colon cancer Mother    Colon cancer Father    Heart disease Father    Stomach cancer Neg Hx    Liver cancer Neg Hx    Esophageal cancer Neg Hx     Medications: Patient's Medications  New Prescriptions   No medications on file  Previous Medications   ACETAMINOPHEN  (TYLENOL ) 500 MG TABLET    Take 2 tablets (1,000 mg total) by mouth every 6 (six) hours as needed for mild pain.   AMOXICILLIN  (AMOXIL ) 500 MG CAPSULE    Take by mouth.   APIXABAN  (ELIQUIS ) 2.5 MG TABS TABLET    Take 1 tablet (2.5 mg total) by mouth 2 (two) times daily.   CALCIUM  CARBONATE-VITAMIN D  600-400 MG-UNIT TABLET    Take 1 tablet by mouth daily.   CARVEDILOL  (COREG ) 3.125 MG TABLET    Take 1 tablet (3.125 mg total) by mouth 2 (two) times daily with a meal.   DICLOFENAC  SODIUM (VOLTAREN ) 1 % GEL    Apply 4 g  topically 4 (four) times daily.   FEXOFENADINE  (ALLEGRA  ALLERGY) 180 MG TABLET    Take 1 tablet (180 mg total) by mouth daily.   FLUTICASONE  (FLONASE ) 50 MCG/ACT NASAL SPRAY    Place 2 sprays into both nostrils daily.   GABAPENTIN  (NEURONTIN ) 100 MG CAPSULE    Take 1 capsule (100 mg total) by mouth at bedtime.   LACTOSE FREE NUTRITION (BOOST) LIQD    Take 237 mLs by mouth 2 (two) times daily between meals.   LOSARTAN  (COZAAR ) 25 MG TABLET    Take 1 tablet (25 mg total) by mouth daily.   METHYLPREDNISOLONE  (MEDROL  DOSEPAK) 4 MG TBPK TABLET    Day 1: 8mg  before breakfast, 4 mg after lunch, 4 mg after supper,  and 8 mg at bedtime Day 2: 4 mg before breakfast, 4 mg after lunch, 4 mg  after supper, and 8 mg  at bedtime Day 3:  4 mg  before breakfast, 4 mg  after lunch, 4 mg after supper, and 4 mg  at bedtime Day 4: 4 mg  before breakfast, 4 mg  after lunch, and 4 mg at bedtime Day 5: 4 mg  before breakfast and 4 mg at bedtime Day 6: 4 mg  before breakfast   OMEGA-3 FATTY ACIDS (FISH OIL) 1000 MG CAPS    Take 1 capsule by mouth daily.   PANTOPRAZOLE  (PROTONIX ) 40 MG TABLET    TAKE ONE TABLET BY MOUTH DAILY   POLYETHYLENE GLYCOL (MIRALAX  / GLYCOLAX ) 17 G PACKET    Take 17 g by mouth daily as needed for moderate constipation.   PROBIOTIC PRODUCT (ALIGN PO)    Take by mouth.   PROPYLENE GLYCOL (SYSTANE COMPLETE) 0.6 % SOLN    Place 1 drop into both eyes daily.   ROSUVASTATIN  (CRESTOR ) 10 MG TABLET    Take 1 tablet (10 mg total) by mouth at bedtime.   SIMETHICONE  (GAS-X) 80 MG CHEWABLE TABLET    Chew 1 tablet (80 mg total) by mouth every 6 (six) hours as needed for flatulence.   SPIRONOLACTONE  (ALDACTONE ) 25 MG TABLET    Take 0.5 tablet (12.5 mg total) by mouth daily.   TORSEMIDE  (DEMADEX ) 20 MG TABLET    TAKE ONE TABLET BY MOUTH DAILY  Modified Medications   No medications on file  Discontinued Medications   No medications on file    Physical Exam: Vitals:   07/03/24 1036  BP: 118/76  Pulse: 62   Temp: 97.7 F (36.5 C)  SpO2: 97%  Height: 4' 11 (1.499 m)   Body mass index is 23.83 kg/m. BP Readings from Last 3 Encounters:  07/03/24 118/76  06/11/24 (!) 160/83  06/05/24 (!) 167/95   Wt Readings from Last 3 Encounters:  06/11/24 118 lb (53.5 kg)  06/05/24 123 lb 7.3 oz (56 kg)  04/24/24 124 lb (56.2 kg)    Physical Exam Constitutional:      Appearance: Normal appearance.  HENT:     Head: Normocephalic and atraumatic.  Cardiovascular:     Rate and Rhythm: Normal rate and regular rhythm.  Pulmonary:     Effort: Pulmonary effort is normal. No respiratory distress.     Breath sounds: Normal breath sounds. No wheezing.  Abdominal:     General: Bowel sounds are normal. There is no distension.     Tenderness: There is no abdominal tenderness. There is no guarding or rebound.     Comments:    Musculoskeletal:        General: Swelling present.     Comments: 1+ pitting odema  Neurological:     Mental Status: She is alert. Mental status is at baseline.     Motor: No weakness.     Labs reviewed: Basic Metabolic Panel: Recent Labs    11/05/23 0713 11/12/23 0723 11/26/23 0718 04/28/24 0725 06/05/24 1041  NA 143   < > 133* 138 134*  K 5.5*   < > 4.7 4.3 4.0  CL 103   < > 93* 96* 97*  CO2 18*   < > 33* 31 27  GLUCOSE 71   < > 89 77 100*  BUN 20   < > 22 20 13   CREATININE 1.14*   < > 1.10* 1.09* 0.88  CALCIUM   9.6   < > 9.6 9.5 9.2  MG 3.2*  --   --   --   --    < > = values in this interval not displayed.   Liver Function Tests: Recent Labs    11/05/23 0713 11/12/23 0723 06/05/24 1041  AST 38* 25 26  ALT 10 13 11   ALKPHOS  --   --  53  BILITOT 1.3* 1.2 1.5*  PROT 7.2 6.8 6.5  ALBUMIN  --   --  3.8   Recent Labs    06/05/24 1041  LIPASE 43   No results for input(s): AMMONIA in the last 8760 hours. CBC: Recent Labs    11/05/23 0713 06/05/24 1041  WBC 3.8 3.9*  NEUTROABS 2,850 3.2  HGB 11.5* 11.3*  HCT 36.0 36.2  MCV 98.4 102.3*  PLT  127* 138*   Lipid Panel: Recent Labs    11/05/23 0715  CHOL 137  HDL 65  LDLCALC 58  TRIG 63  CHOLHDL 2.1   TSH: No results for input(s): TSH in the last 8760 hours. A1C: No results found for: HGBA1C  Assessment and Plan Assessment & Plan  1. Erythema (Primary) 4 x 4 cm round area of erythema No tenderness, no warmth  No exudates Cont with daily dressing changes If no improvement follow up in clinic Cont with bacitracin   2. Chronic diastolic congestive heart failure (HCC) Pt c/o dizziness Her recent sodium 134  Ortho stasis neg Chronic exertional  no recent change Denies chest pain, palpitation Instructed patient to cut down on the dose to 10 mg for 5 days and if no improvement in dizziness, go back to 20 mg dose Monitor for worsening swelling and SOB   3. Atrial fibrillation, chronic (HCC) Cont with eliquis  No signs of bleeding

## 2024-07-31 ENCOUNTER — Other Ambulatory Visit: Payer: Self-pay

## 2024-07-31 ENCOUNTER — Encounter (HOSPITAL_BASED_OUTPATIENT_CLINIC_OR_DEPARTMENT_OTHER): Payer: Self-pay

## 2024-07-31 ENCOUNTER — Emergency Department (HOSPITAL_BASED_OUTPATIENT_CLINIC_OR_DEPARTMENT_OTHER)
Admission: EM | Admit: 2024-07-31 | Discharge: 2024-07-31 | Disposition: A | Discharge status: Home / Self Care | Attending: Emergency Medicine | Admitting: Emergency Medicine

## 2024-07-31 ENCOUNTER — Encounter: Admitting: Sports Medicine

## 2024-07-31 DIAGNOSIS — L03113 Cellulitis of right upper limb: Secondary | ICD-10-CM | POA: Insufficient documentation

## 2024-07-31 DIAGNOSIS — Z7901 Long term (current) use of anticoagulants: Secondary | ICD-10-CM | POA: Insufficient documentation

## 2024-07-31 MED ORDER — CEPHALEXIN 500 MG PO CAPS: 500.0000 mg | ORAL_CAPSULE | Freq: Four times a day (QID) | ORAL | 0 refills | Status: DC

## 2024-07-31 NOTE — ED Triage Notes (Addendum)
 Patient reports possible right arm cellulitis. Had  a fall in August and got a skin tear. She had a mastectomy with lymph node removal on that side and says she has had it before. There is purulent drainage from the skin tear in triage and redness is noted up the arm.

## 2024-07-31 NOTE — ED Provider Notes (Signed)
 [place] EMERGENCY DEPARTMENT AT [facility] Provider Note   CSN: [id number] Arrival date & time: 07/31/24  1021     Patient presents with: Cellulitis   [name] is a 89 y.o. female.   HPI Patient presents complaining of possible cellulitis of right upper extremity.  States had a fall in August.  Now having redness in the right upper arm.  States she has had cellulitis like this in the past.  Previous mastectomy with lymph node removal on the side.  Does have some chronic edema.  No fevers or chills.    Prior to Admission medications   Medication Sig Start Date End Date Taking? Authorizing Provider  cephALEXin  (KEFLEX ) 500 MG capsule Take 1 capsule (500 mg total) by mouth 4 (four) times daily. 07/31/24  Yes Patsey Lot, MD  acetaminophen  (TYLENOL ) 500 MG tablet Take 2 tablets (1,000 mg total) by mouth every 6 (six) hours as needed for mild pain. 01/17/16   Dwan Kyla HERO, PA-C  apixaban  (ELIQUIS ) 2.5 MG TABS tablet Take 1 tablet (2.5 mg total) by mouth 2 (two) times daily. 09/27/23   O'NealDarryle Ned, MD  Calcium  Carbonate-Vitamin D  600-400 MG-UNIT tablet Take 1 tablet by mouth daily.    [provider]  carvedilol  (COREG ) 3.125 MG tablet Take 1 tablet (3.125 mg total) by mouth 2 (two) times daily with a meal. 09/27/23   O'Neal, Darryle Ned, MD  diclofenac  Sodium (VOLTAREN ) 1 % GEL Apply 4 g topically 4 (four) times daily. 06/05/24   Emil Share, DO  fexofenadine  (ALLEGRA  ALLERGY) 180 MG tablet Take 1 tablet (180 mg total) by mouth daily. 07/31/23   Sherlynn Madden, MD  fluticasone  (FLONASE ) 50 MCG/ACT nasal spray Place 2 sprays into both nostrils daily. 07/31/23   Sherlynn Madden, MD  gabapentin  (NEURONTIN ) 100 MG capsule Take 1 capsule (100 mg total) by mouth at bedtime. 01/17/24   Sherlynn Madden, MD  lactose free nutrition (BOOST) LIQD Take 237 mLs by mouth 2 (two) times daily between meals.    [provider]   losartan  (COZAAR ) 25 MG tablet Take 1 tablet (25 mg total) by mouth daily. 11/06/23   Sherlynn Madden, MD  Omega-3 Fatty Acids (FISH OIL) 1000 MG CAPS Take 1 capsule by mouth daily.    [provider]  pantoprazole  (PROTONIX ) 40 MG tablet TAKE ONE TABLET BY MOUTH DAILY 05/25/24   Veludandi, Prashanthi, MD  polyethylene glycol (MIRALAX  / GLYCOLAX ) 17 g packet Take 17 g by mouth daily as needed for moderate constipation. 07/15/20   Mast, Man X, NP  Probiotic Product (ALIGN PO) Take by mouth.    [provider]  Propylene Glycol (SYSTANE COMPLETE) 0.6 % SOLN Place 1 drop into both eyes daily.    [provider]  rosuvastatin  (CRESTOR ) 10 MG tablet Take 1 tablet (10 mg total) by mouth at bedtime. 09/27/23   O'NealDarryle Ned, MD  simethicone  (GAS-X) 80 MG chewable tablet Chew 1 tablet (80 mg total) by mouth every 6 (six) hours as needed for flatulence. 02/07/22   Cleotilde Garnette HERO, MD  spironolactone  (ALDACTONE ) 25 MG tablet Take 0.5 tablet (12.5 mg total) by mouth daily. 04/29/24   Barbaraann Darryle Ned, MD  torsemide  (DEMADEX ) 20 MG tablet TAKE ONE TABLET BY MOUTH DAILY 06/30/24   O'Neal, Darryle Ned, MD    Allergies: Aspirin    Review of Systems  Updated Vital Signs BP 135/74 (BP Location: Right Arm)   Pulse (!) 44   Temp 97.9 F (  36.6 C) (Oral)   Resp 18   LMP 10/16/1975 (Approximate)   SpO2 93%   Physical Exam Vitals and nursing note reviewed.  HENT:     Head:     Comments: Ecchymosis over right zygomatic arch.  Chronic. Skin:    Comments: Posterior right upper arm does show erythema from the elbow progressing up the posterior arm.  No induration but does have some warmth.  At the distal aspect of it there is a abrasion on the skin.  No drainage.  Neurological:     Mental Status: She is alert.     (all labs ordered are listed, but only abnormal results are displayed) Labs Reviewed - No data to display  EKG: None  Radiology: No results  found.   Procedures   Medications Ordered in the ED - No data to display                                  Medical Decision Making Risk Prescription drug management.   Patient with potential cellulitis.  Right upper posterior arm.  History of same.  Does have some erythema and does have an abrasion of the skin on the distal aspect of this.  Likely does have early cellulitis.  With the previous lymph nodes I think she is high risk and would benefit from short course of antibiotics.  Will give 5 days.  Does not appear systemically ill do not think we need blood work at this time.  Follow-up with PCP as needed.     Final diagnoses:  Cellulitis of right upper extremity    ED Discharge Orders          Ordered    cephALEXin  (KEFLEX ) 500 MG capsule  4 times daily        07/31/24 1041               Patsey Lot, MD 07/31/24 1054

## 2024-07-31 NOTE — ED Notes (Signed)

## 2024-07-31 NOTE — Discharge Instructions (Addendum)
 It looks as if there may be an early infection on the right upper arm.  Take the antibiotic and watch for worsening infection.

## 2024-08-07 ENCOUNTER — Encounter: Payer: Self-pay | Admitting: Sports Medicine

## 2024-08-07 ENCOUNTER — Non-Acute Institutional Stay: Admitting: Sports Medicine

## 2024-08-07 VITALS — BP 128/64 | HR 74 | Temp 98.4°F | Ht 59.0 in | Wt 122.0 lb

## 2024-08-07 DIAGNOSIS — I5032 Chronic diastolic (congestive) heart failure: Secondary | ICD-10-CM

## 2024-08-07 DIAGNOSIS — R32 Unspecified urinary incontinence: Secondary | ICD-10-CM

## 2024-08-07 DIAGNOSIS — K5901 Slow transit constipation: Secondary | ICD-10-CM | POA: Diagnosis not present

## 2024-08-07 DIAGNOSIS — J302 Other seasonal allergic rhinitis: Secondary | ICD-10-CM

## 2024-08-07 DIAGNOSIS — I482 Chronic atrial fibrillation, unspecified: Secondary | ICD-10-CM | POA: Diagnosis not present

## 2024-08-07 NOTE — Patient Instructions (Addendum)
 Discuss with patient to schedule Covid booster and shingles vaccine at pharmacy   2.) Reminder: Call imaging to schedule your mammogram   3.)

## 2024-08-07 NOTE — Progress Notes (Signed)
 Careteam: Patient Care Team: Sherlynn Madden, MD as PCP - General (Internal Medicine) O'Neal, Darryle Ned, MD as PCP - Cardiology (Cardiology) Cleotilde Ronal RAMAN, MD (Gynecology) Melodye Coy, MD (Inactive) (Hematology and Oncology) Celestia Agent, MD (Inactive) (Gastroenterology) Mast, Man X, NP as Nurse Practitioner (Internal Medicine)  PLACE OF SERVICE:  [facility]  Advanced Directive information    Allergies  Allergen Reactions   Aspirin Rash    Chief Complaint  Patient presents with   Medical Management of Chronic Issues    Follow up. Scheduled medicare wellness visit. Discussed mammogram, patient will call to schedule. Here with daughter    Shortness of Breath    X couple of months.    Medication Problem    Keflex  caused some GI issues, ? If this should be added to adverse reaction list    Bathroom Frequency    Due to diuretics and stomach issues.      Discussed the use of AI scribe software for clinical note transcription with the patient, who gave verbal consent to proceed.  History of Present Illness  [name] is an 89 year old female who presents with shortness of breath.  She has been experiencing exertional  shortness of breath for the past couple of months.  Pt ambulates with the walker and able to walk for 10-15 minutes without stopping. She feels congested and frequently clears her throat.  She has a history of chronic constipation, which worsened with antibiotics prescribed for cellulitis. Despite gastrointestinal discomfort during the antibiotic course, she manages constipation with a high intake of produce, Miralax , and occasionally other laxatives, and tries to go to the bathroom every day.  She experiences urinary incontinence and frequent urination, particularly in the mornings, which she attributes to her diuretics. She uses pads and specialized pants to manage leakage. Despite taking diuretics in the morning, she needs to urinate every  30-45 minutes, significantly impacting her daily activities and confining her to her apartment. She reports incomplete bladder emptying and sometimes needs to sit for a few minutes to finish urinating. No burning sensation or blood in her urine is noted.    Review of Systems:  Review of Systems  Constitutional:  Negative for chills and fever.  HENT:  Negative for congestion and sore throat.   Respiratory:  Positive for shortness of breath (exertional). Negative for cough and sputum production.   Cardiovascular:  Negative for chest pain, palpitations and leg swelling.  Gastrointestinal:  Positive for constipation. Negative for abdominal pain, heartburn and nausea.  Genitourinary:  Negative for dysuria, frequency and hematuria.  Musculoskeletal:  Negative for falls and myalgias.  Neurological:  Negative for dizziness.   Negative unless indicated in HPI.   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   Right lower lobectomy   Diverticulitis 1999   Femur fracture (HCC) 03/2013   left, non weight bearing for 2 1/2 weeks   GERD (gastroesophageal reflux disease)    Hemorrhoids 1991   HTN (hypertension)    Lymphedema of upper extremity    right arm   Osteoporosis    PONV (postoperative nausea and vomiting)    Urinary, incontinence, stress female    Vitamin D  deficiency disease    Past Surgical History:  Procedure Laterality Date  APPENDECTOMY     BREAST BIOPSY  05/04/1987   Right Dr Neysa   BREAST IMPLANT REMOVAL Right 06/05/2010   BUNIONECTOMY  10/15/1986   carcinoid Right    Right lower lobectomy 2017   COLONOSCOPY     COLONOSCOPY W/ POLYPECTOMY     EYE SURGERY  5/14 ; 6/14   Cataract Surgery   JOINT REPLACEMENT     2002 left; 2009 right Hip, rt knee 2012   KNEE ARTHROPLASTY Right  10/15/2010   Darryle Long   LOBECTOMY Right 01/05/2016   Procedure: RIGHT LOWER LOBE LOBECTOMY;  Surgeon: Elspeth JAYSON Millers, MD;  Location: [facility] OR;  Service: Thoracic;  Laterality: Right;   MASTECTOMY PARTIAL / LUMPECTOMY W/ AXILLARY LYMPHADENECTOMY  04/05/1987   Right - Dr Neysa   RESECTION OF MEDIASTINAL MASS N/A 01/05/2016   Procedure: RESECTION OF PLEURAL MASS, right;  Surgeon: Elspeth JAYSON Millers, MD;  Location: [facility] OR;  Service: Thoracic;  Laterality: N/A;   TONSILLECTOMY     TOTAL HIP ARTHROPLASTY     TUBAL LIGATION Bilateral 10/15/1968   VAGINAL HYSTERECTOMY  10/16/1975   myomata   VIDEO ASSISTED THORACOSCOPY (VATS)/WEDGE RESECTION Right 01/05/2016   Procedure: RIGHT VIDEO ASSISTED THORACOSCOPY (VATS)/WEDGE RESECTION;  Surgeon: Elspeth JAYSON Millers, MD;  Location: [facility] OR;  Service: Thoracic;  Laterality: Right;   Social History:   reports that she has never smoked. She has never used smokeless tobacco. She reports that she does not drink alcohol  and does not use drugs.  Family History  Problem Relation Age of Onset   Colon cancer Mother    Colon cancer Father    Heart disease Father    Stomach cancer Neg Hx    Liver cancer Neg Hx    Esophageal cancer Neg Hx     Medications: Patient's Medications  New Prescriptions   No medications on file  Previous Medications   ACETAMINOPHEN  (TYLENOL ) 500 MG TABLET    Take 2 tablets (1,000 mg total) by mouth every 6 (six) hours as needed for mild pain.   APIXABAN  (ELIQUIS ) 2.5 MG TABS TABLET    Take 1 tablet (2.5 mg total) by mouth 2 (two) times daily.   CALCIUM  CARBONATE-VITAMIN D  600-400 MG-UNIT TABLET    Take 1 tablet by mouth daily.   CARVEDILOL  (COREG ) 3.125 MG TABLET    Take 1 tablet (3.125 mg total) by mouth 2 (two) times daily with a meal.   CEPHALEXIN  (KEFLEX ) 500 MG CAPSULE    Take 1 capsule (500 mg total) by mouth 4 (four) times daily.   DICLOFENAC  SODIUM (VOLTAREN ) 1 % GEL    Apply 4 g topically 4 (four) times daily.    FEXOFENADINE  (ALLEGRA  ALLERGY) 180 MG TABLET    Take 1 tablet (180 mg total) by mouth daily.   FLUTICASONE  (FLONASE ) 50 MCG/ACT NASAL SPRAY    Place 2 sprays into both nostrils daily.   GABAPENTIN  (NEURONTIN ) 100 MG CAPSULE    Take 1 capsule (100 mg total) by mouth at bedtime.   LACTOSE FREE NUTRITION (BOOST) LIQD    Take 237 mLs by mouth daily.   LOSARTAN  (COZAAR ) 25 MG TABLET    Take 1 tablet (25 mg total) by mouth daily.   OMEGA-3 FATTY ACIDS (FISH OIL) 1000 MG CAPS    Take 1 capsule by mouth daily.   PANTOPRAZOLE  (PROTONIX ) 40 MG TABLET    TAKE ONE TABLET BY MOUTH DAILY   POLYETHYLENE GLYCOL (MIRALAX  / GLYCOLAX ) 17 G PACKET    Take 17  g by mouth daily as needed for moderate constipation.   PROBIOTIC PRODUCT (ALIGN PO)    Take by mouth.   PROPYLENE GLYCOL (SYSTANE COMPLETE) 0.6 % SOLN    Place 1 drop into both eyes daily.   ROSUVASTATIN  (CRESTOR ) 10 MG TABLET    Take 1 tablet (10 mg total) by mouth at bedtime.   SIMETHICONE  (GAS-X) 80 MG CHEWABLE TABLET    Chew 1 tablet (80 mg total) by mouth every 6 (six) hours as needed for flatulence.   SPIRONOLACTONE  (ALDACTONE ) 25 MG TABLET    Take 0.5 tablet (12.5 mg total) by mouth daily.   TORSEMIDE  (DEMADEX ) 20 MG TABLET    TAKE ONE TABLET BY MOUTH DAILY  Modified Medications   No medications on file  Discontinued Medications   No medications on file    Physical Exam: Vitals:   08/07/24 0830  BP: 128/64  Pulse: 74  Temp: 98.4 F (36.9 C)  SpO2: 95%  Weight: 122 lb (55.3 kg)  Height: 4' 11 (1.499 m)   Body mass index is 24.64 kg/m. BP Readings from Last 3 Encounters:  08/07/24 128/64  07/31/24 125/60  07/03/24 118/76   Wt Readings from Last 3 Encounters:  08/07/24 122 lb (55.3 kg)  06/11/24 118 lb (53.5 kg)  06/05/24 123 lb 7.3 oz (56 kg)    Physical Exam Constitutional:      Appearance: Normal appearance.  HENT:     Head: Normocephalic and atraumatic.  Cardiovascular:     Rate and Rhythm: Normal rate and regular rhythm.   Pulmonary:     Effort: Pulmonary effort is normal. No respiratory distress.     Breath sounds: Normal breath sounds. No wheezing.  Abdominal:     General: Bowel sounds are normal. There is no distension.     Tenderness: There is no abdominal tenderness. There is no rebound.     Comments:    Musculoskeletal:        General: No swelling or tenderness.  Neurological:     Mental Status: She is alert. Mental status is at baseline.     Sensory: No sensory deficit.     Motor: No weakness.     Labs reviewed: Basic Metabolic Panel: Recent Labs    11/05/23 0713 11/12/23 0723 11/26/23 0718 04/28/24 0725 06/05/24 1041  NA 143   < > 133* 138 134*  K 5.5*   < > 4.7 4.3 4.0  CL 103   < > 93* 96* 97*  CO2 18*   < > 33* 31 27  GLUCOSE 71   < > 89 77 100*  BUN 20   < > 22 20 13   CREATININE 1.14*   < > 1.10* 1.09* 0.88  CALCIUM  9.6   < > 9.6 9.5 9.2  MG 3.2*  --   --   --   --    < > = values in this interval not displayed.   Liver Function Tests: Recent Labs    11/05/23 0713 11/12/23 0723 06/05/24 1041  AST 38* 25 26  ALT 10 13 11   ALKPHOS  --   --  53  BILITOT 1.3* 1.2 1.5*  PROT 7.2 6.8 6.5  ALBUMIN  --   --  3.8   Recent Labs    06/05/24 1041  LIPASE 43   No results for input(s): AMMONIA in the last 8760 hours. CBC: Recent Labs    11/05/23 0713 06/05/24 1041  WBC 3.8 3.9*  NEUTROABS 2,850 3.2  HGB 11.5* 11.3*  HCT 36.0 36.2  MCV 98.4 102.3*  PLT 127* 138*   Lipid Panel: Recent Labs    11/05/23 0715  CHOL 137  HDL 65  LDLCALC 58  TRIG 63  CHOLHDL 2.1   TSH: No results for input(s): TSH in the last 8760 hours. A1C: No results found for: HGBA1C  Assessment and Plan Assessment & Plan   1. Slow transit constipation Increase fiber intake Cont with miralax    2. Urinary incontinence, unspecified type Discussed pharmacological interventions including myrbetriq Pt not interested at this time  3. Chronic diastolic congestive heart failure  (HCC) (Primary) Lungs clear  Continue losartan , spironolactone , and torsemide . - Monitor for fluid overload signs: increased leg swelling or weight gain over 3-4 pounds. - Weigh daily and report significant weight changes.  4. Atrial fibrillation, chronic (HCC) No signs of bleeding    Seasonal allergies Take flonase , claritin

## 2024-08-31 ENCOUNTER — Encounter: Payer: Self-pay | Admitting: Cardiovascular Disease

## 2024-09-24 ENCOUNTER — Emergency Department (HOSPITAL_BASED_OUTPATIENT_CLINIC_OR_DEPARTMENT_OTHER)
Admission: EM | Admit: 2024-09-24 | Discharge: 2024-09-24 | Disposition: A | Discharge status: Home / Self Care | Attending: Emergency Medicine | Admitting: Emergency Medicine

## 2024-09-24 ENCOUNTER — Ambulatory Visit: Payer: Self-pay | Admitting: Nurse Practitioner

## 2024-09-24 ENCOUNTER — Other Ambulatory Visit: Payer: Self-pay

## 2024-09-24 ENCOUNTER — Encounter: Payer: Self-pay | Admitting: Nurse Practitioner

## 2024-09-24 ENCOUNTER — Emergency Department (HOSPITAL_BASED_OUTPATIENT_CLINIC_OR_DEPARTMENT_OTHER)

## 2024-09-24 ENCOUNTER — Encounter (HOSPITAL_BASED_OUTPATIENT_CLINIC_OR_DEPARTMENT_OTHER): Payer: Self-pay | Admitting: Emergency Medicine

## 2024-09-24 VITALS — BP 118/74 | HR 71 | Temp 98.4°F | Ht 59.0 in | Wt 122.2 lb

## 2024-09-24 DIAGNOSIS — N1832 Chronic kidney disease, stage 3b: Secondary | ICD-10-CM | POA: Diagnosis not present

## 2024-09-24 DIAGNOSIS — I10 Essential (primary) hypertension: Secondary | ICD-10-CM

## 2024-09-24 DIAGNOSIS — Z0000 Encounter for general adult medical examination without abnormal findings: Secondary | ICD-10-CM

## 2024-09-24 DIAGNOSIS — R0602 Shortness of breath: Secondary | ICD-10-CM | POA: Insufficient documentation

## 2024-09-24 DIAGNOSIS — D500 Iron deficiency anemia secondary to blood loss (chronic): Secondary | ICD-10-CM

## 2024-09-24 DIAGNOSIS — I4891 Unspecified atrial fibrillation: Secondary | ICD-10-CM | POA: Insufficient documentation

## 2024-09-24 DIAGNOSIS — E785 Hyperlipidemia, unspecified: Secondary | ICD-10-CM

## 2024-09-24 DIAGNOSIS — K219 Gastro-esophageal reflux disease without esophagitis: Secondary | ICD-10-CM | POA: Diagnosis not present

## 2024-09-24 DIAGNOSIS — Z79899 Other long term (current) drug therapy: Secondary | ICD-10-CM | POA: Diagnosis not present

## 2024-09-24 DIAGNOSIS — Z853 Personal history of malignant neoplasm of breast: Secondary | ICD-10-CM | POA: Diagnosis not present

## 2024-09-24 DIAGNOSIS — I5032 Chronic diastolic (congestive) heart failure: Secondary | ICD-10-CM

## 2024-09-24 DIAGNOSIS — I509 Heart failure, unspecified: Secondary | ICD-10-CM | POA: Diagnosis not present

## 2024-09-24 DIAGNOSIS — I482 Chronic atrial fibrillation, unspecified: Secondary | ICD-10-CM | POA: Diagnosis not present

## 2024-09-24 DIAGNOSIS — Z7901 Long term (current) use of anticoagulants: Secondary | ICD-10-CM | POA: Diagnosis not present

## 2024-09-24 DIAGNOSIS — E871 Hypo-osmolality and hyponatremia: Secondary | ICD-10-CM | POA: Diagnosis not present

## 2024-09-24 DIAGNOSIS — Z85118 Personal history of other malignant neoplasm of bronchus and lung: Secondary | ICD-10-CM | POA: Diagnosis not present

## 2024-09-24 DIAGNOSIS — K5901 Slow transit constipation: Secondary | ICD-10-CM

## 2024-09-24 DIAGNOSIS — C7A090 Malignant carcinoid tumor of the bronchus and lung: Secondary | ICD-10-CM | POA: Diagnosis not present

## 2024-09-24 LAB — CBC WITH DIFFERENTIAL/PLATELET
Abs Immature Granulocytes: 0 K/uL (ref 0.00–0.07)
Basophils Absolute: 0 K/uL (ref 0.0–0.1)
Basophils Relative: 0 %
Eosinophils Absolute: 0 K/uL (ref 0.0–0.5)
Eosinophils Relative: 1 %
HCT: 34.6 % — ABNORMAL LOW (ref 36.0–46.0)
Hemoglobin: 11 g/dL — ABNORMAL LOW (ref 12.0–15.0)
Immature Granulocytes: 0 %
Lymphocytes Relative: 15 %
Lymphs Abs: 0.6 K/uL — ABNORMAL LOW (ref 0.7–4.0)
MCH: 32.7 pg (ref 26.0–34.0)
MCHC: 31.8 g/dL (ref 30.0–36.0)
MCV: 103 fL — ABNORMAL HIGH (ref 80.0–100.0)
Monocytes Absolute: 0.5 K/uL (ref 0.1–1.0)
Monocytes Relative: 12 %
Neutro Abs: 2.8 K/uL (ref 1.7–7.7)
Neutrophils Relative %: 72 %
Platelets: 135 K/uL — ABNORMAL LOW (ref 150–400)
RBC: 3.36 MIL/uL — ABNORMAL LOW (ref 3.87–5.11)
RDW: 13.9 % (ref 11.5–15.5)
WBC: 3.9 K/uL — ABNORMAL LOW (ref 4.0–10.5)
nRBC: 0 % (ref 0.0–0.2)

## 2024-09-24 LAB — BASIC METABOLIC PANEL WITH GFR
Anion gap: 9 (ref 5–15)
BUN: 23 mg/dL (ref 8–23)
CO2: 34 mmol/L — ABNORMAL HIGH (ref 22–32)
Calcium: 9.6 mg/dL (ref 8.9–10.3)
Chloride: 95 mmol/L — ABNORMAL LOW (ref 98–111)
Creatinine, Ser: 1.1 mg/dL — ABNORMAL HIGH (ref 0.44–1.00)
GFR, Estimated: 48 mL/min — ABNORMAL LOW (ref >60)
Glucose, Bld: 98 mg/dL (ref 70–99)
Potassium: 4.2 mmol/L (ref 3.5–5.1)
Sodium: 137 mmol/L (ref 135–145)

## 2024-09-24 LAB — PRO BRAIN NATRIURETIC PEPTIDE: Pro Brain Natriuretic Peptide: 4348 pg/mL — ABNORMAL HIGH (ref <300.0)

## 2024-09-24 LAB — RESP PANEL BY RT-PCR (RSV, FLU A&B, COVID)  RVPGX2
Influenza A by PCR: NEGATIVE
Influenza B by PCR: NEGATIVE
Resp Syncytial Virus by PCR: NEGATIVE
SARS Coronavirus 2 by RT PCR: NEGATIVE

## 2024-09-24 LAB — TROPONIN T, HIGH SENSITIVITY
Troponin T High Sensitivity: 28 ng/L — ABNORMAL HIGH (ref 0–19)
Troponin T High Sensitivity: 24 ng/L — ABNORMAL HIGH (ref 0–19)

## 2024-09-24 MED ORDER — FUROSEMIDE 10 MG/ML IJ SOLN
60.0000 mg | Freq: Once | INTRAMUSCULAR | Status: AC
  Administered 2024-09-24: 60 mg via INTRAVENOUS
  Filled 2024-09-24: qty 6

## 2024-09-24 NOTE — Patient Instructions (Addendum)
 1.) Visit your local pharmacy if you would like to receive additional covid boosters and your second shingles vaccine    2.) Fasting labs on 10/01/2024 @ 7 am, [facility]

## 2024-09-24 NOTE — Assessment & Plan Note (Signed)
 Blood pressure is controlled,  takes Losartan , Coreg , Rosuvastatin .

## 2024-09-24 NOTE — Assessment & Plan Note (Signed)
 Bun/creat 13/0.88 06/05/24

## 2024-09-24 NOTE — Progress Notes (Signed)
 Location:   Clinic [facility]   Place of Service:    Provider: Larwance Byrant Valent NP  Sherlynn Madden, MD  Patient Care Team: Sherlynn Madden, MD as PCP - General (Internal Medicine) O'Neal, Darryle Ned, MD as PCP - Cardiology (Cardiology) Cleotilde Ronal RAMAN, MD (Gynecology) Melodye Coy, MD (Inactive) (Hematology and Oncology) Celestia Agent, MD (Inactive) (Gastroenterology) Rushil Kimbrell X, NP as Nurse Practitioner (Internal Medicine)  Extended Emergency Contact Information Primary Emergency Contact: Sundby,[name] Address: [address]          , [place] [address] [place] Home Phone: [phone] Relation: Son Secondary Emergency Contact: [name],[name] Address: [address]          [place], [place] [address] [place] Home Phone: [phone] Mobile Phone: [phone] Relation: Daughter  Code Status: DNR Goals of care: Advanced Directive information    09/24/2024    4:03 PM  Advanced Directives  Does Patient Have a Medical Advance Directive? Yes  Type of Estate Agent of [place];Living will  Does patient want to make changes to medical advance directive? No - Patient declined     Chief Complaint  Patient presents with   Shortness of Breath    SOB and dizziness   Order Request     Oxygen order request to sent to [facility]     HPI:  Pt is a 89 y.o. female seen today for an acute visit for progressive SOB, even if reach over to the shelves in kitchen, admitted non productive cough occasionally, uses O2 at night is chronic, denied chest pain, palpitation, GI or urinary symptoms. She is afebrile.     Nasal congestion, on Allegra , Flonase              Hyponatremia, Na 134 06/05/24, had Nephrology evaluation. 2053ml/day fluid restriction.              HTN, takes Losartan , Coreg , Rosuvastatin .             Afib, takes Coreg , Eliquis              CHF, chronic edema BLE, near none,  takes Spirolactone, Torsemide , EF  60-65%, hospitalized 05/30/20-06/08/20 for acute on CHF,  . F/u cardiology,  on 2052ml/day fluid restriction.             CKD, Bun/creat 13/0.88 06/05/24             Constipation, takes MiraLax , Bisacodyl .              Atrophic vaginitis, takes Estradiol  vaginal tab             GERD takes Pantoprazole , bloated abd sometimes, takes Simethicone              Anemia, Hgb 11.3 06/05/24,  off Fe             Hyperlipidemia, LDL 58 11/05/23, takes Rosuvastatin .              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 Small table lung nodules, no need for continued f/u CT scan.     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   Right lower lobectomy   Diverticulitis 1999  Femur fracture (HCC) 03/2013   left, non weight bearing for 2 1/2 weeks   GERD (gastroesophageal reflux disease)    Hemorrhoids 1991   HTN (hypertension)    Lymphedema of upper extremity    right arm   Osteoporosis    PONV (postoperative nausea and vomiting)    Urinary, incontinence, stress female    Vitamin D  deficiency disease    Past Surgical History:  Procedure Laterality Date   APPENDECTOMY     BREAST BIOPSY  05/04/1987   Right Dr Neysa   BREAST IMPLANT REMOVAL Right 06/05/2010   BUNIONECTOMY  10/15/1986   carcinoid Right    Right lower lobectomy 2017   COLONOSCOPY     COLONOSCOPY W/ POLYPECTOMY     EYE SURGERY  5/14 ; 6/14   Cataract Surgery   JOINT REPLACEMENT     2002 left; 2009 right Hip, rt knee 2012   KNEE ARTHROPLASTY Right 10/15/2010   Darryle Long   LOBECTOMY Right 01/05/2016   Procedure: RIGHT LOWER LOBE LOBECTOMY;  Surgeon: Elspeth JAYSON Millers, MD;  Location: [facility] OR;  Service: Thoracic;  Laterality: Right;    MASTECTOMY PARTIAL / LUMPECTOMY W/ AXILLARY LYMPHADENECTOMY  04/05/1987   Right - Dr Neysa   RESECTION OF MEDIASTINAL MASS N/A 01/05/2016   Procedure: RESECTION OF PLEURAL MASS, right;  Surgeon: Elspeth JAYSON Millers, MD;  Location: [facility] OR;  Service: Thoracic;  Laterality: N/A;   TONSILLECTOMY     TOTAL HIP ARTHROPLASTY     TUBAL LIGATION Bilateral 10/15/1968   VAGINAL HYSTERECTOMY  10/16/1975   myomata   VIDEO ASSISTED THORACOSCOPY (VATS)/WEDGE RESECTION Right 01/05/2016   Procedure: RIGHT VIDEO ASSISTED THORACOSCOPY (VATS)/WEDGE RESECTION;  Surgeon: Elspeth JAYSON Millers, MD;  Location: [facility] OR;  Service: Thoracic;  Laterality: Right;    Allergies[1]  Allergies as of 09/24/2024       Reactions   Aspirin Rash        Medication List        Accurate as of September 24, 2024  4:58 PM. If you have any questions, ask your nurse or doctor.          STOP taking these medications    fexofenadine  180 MG tablet Commonly known as: Allegra  Allergy Stopped by: Alyene Predmore, NP   fluticasone  50 MCG/ACT nasal spray Commonly known as: FLONASE  Stopped by: Kimothy Kishimoto, NP       TAKE these medications    acetaminophen  500 MG tablet Commonly known as: TYLENOL  Take 2 tablets (1,000 mg total) by mouth every 6 (six) hours as needed for mild pain.   ALIGN PO Take by mouth.   apixaban  2.5 MG Tabs tablet Commonly known as: Eliquis  Take 1 tablet (2.5 mg total) by mouth 2 (two) times daily.   Calcium  Carbonate-Vitamin D  600-400 MG-UNIT tablet Take 1 tablet by mouth daily.   carvedilol  3.125 MG tablet Commonly known as: COREG  Take 1 tablet (3.125 mg total) by mouth 2 (two) times daily with a meal.   Fish Oil 1000 MG Caps Take 1 capsule by mouth daily.   gabapentin  100 MG capsule Commonly known as: NEURONTIN  Take 1 capsule (100 mg total) by mouth at bedtime.   lactose free nutrition Liqd Take 237 mLs by mouth daily.   losartan  25 MG tablet Commonly known as: COZAAR  Take 1 tablet  (25 mg total) by mouth daily.   OXYGEN Inhale 2 L into the lungs at bedtime.   pantoprazole  40 MG tablet Commonly known as: PROTONIX  TAKE ONE TABLET  BY MOUTH DAILY   polyethylene glycol 17 g packet Commonly known as: MIRALAX  / GLYCOLAX  Take 17 g by mouth daily as needed for moderate constipation.   rosuvastatin  10 MG tablet Commonly known as: CRESTOR  Take 1 tablet (10 mg total) by mouth at bedtime.   simethicone  80 MG chewable tablet Commonly known as: Gas-X Chew 1 tablet (80 mg total) by mouth every 6 (six) hours as needed for flatulence.   spironolactone  25 MG tablet Commonly known as: ALDACTONE  Take 0.5 tablet (12.5 mg total) by mouth daily.   Systane Complete 0.6 % Soln Generic drug: Propylene Glycol Place 1 drop into both eyes daily.   torsemide  20 MG tablet Commonly known as: DEMADEX  TAKE ONE TABLET BY MOUTH DAILY               Durable Medical Equipment  (From admission, onward)           Start     Ordered   09/24/24 0000  For home use only DME oxygen       Question Answer Comment  Length of Need Lifetime   Oxygen delivery system: Gas      09/24/24 1629            Review of Systems  Constitutional:  Positive for fatigue. Negative for appetite change and fever.  HENT:  Positive for congestion, hearing loss and sinus pressure. Negative for ear pain, facial swelling, postnasal drip, rhinorrhea, sinus pain, sore throat, trouble swallowing and voice change.   Eyes:  Negative for visual disturbance.  Respiratory:  Positive for cough and shortness of breath.        Hx of right lower lobectomy 2017. Chronic cough remains the same. Uses O2 via [place] at night  New and worsened SOB  Cardiovascular:  Negative for leg swelling.  Gastrointestinal:  Negative for abdominal pain, constipation and nausea.  Genitourinary:  Positive for frequency. Negative for dysuria and urgency.       2-3x/night for years   Musculoskeletal:  Positive for arthralgias and gait  problem.  Skin:  Negative for color change.  Neurological:  Negative for speech difficulty, weakness and headaches.  Psychiatric/Behavioral:  Negative for behavioral problems and sleep disturbance. The patient is not nervous/anxious.     Immunization History  Administered Date(s) Administered   Fluad Quad(high Dose 65+) 07/31/2021   Fluad Trivalent(High Dose 65+) 07/30/2024   INFLUENZA, HIGH DOSE SEASONAL PF 07/27/2020, 08/21/2022, 08/14/2023   Influenza-Unspecified 06/30/2016   Moderna Covid-19 Vaccine Bivalent Booster 45yrs & up 08/14/2023   Moderna Sars-Covid-2 Vaccination 10/19/2019, 11/16/2019   Pneumococcal Polysaccharide-23 12/16/2001   Pneumococcal-Unspecified 07/30/2008, 10/22/2014   Td 12/16/2001   Tdap 10/15/2009, 06/11/2024   Tetanus 05/09/2010   Zoster Recombinant(Shingrix) 04/24/2021   Pertinent  Health Maintenance Due  Topic Date Due   Mammogram  01/19/2023   Influenza Vaccine  Completed   Bone Density Scan  Completed      01/17/2024    9:49 AM 01/17/2024   10:15 AM 04/24/2024   10:07 AM 07/03/2024   10:32 AM 09/24/2024    4:03 PM  Fall Risk  Falls in the past year? 0  0 1 1  Was there an injury with Fall? 0  1  1  1  1   Was there an injury with Fall? - Comments     Head injury and blacked eye, skin tear on right arm  Fall Risk Category Calculator 0  1 2 2   Patient at Risk for Falls Due to  No Fall Risks   No Fall Risks History of fall(s)  Fall risk Follow up Falls evaluation completed   Falls evaluation completed Falls evaluation completed     Data saved with a previous flowsheet row definition   Functional Status Survey:    Vitals:   09/24/24 1131  BP: 118/74  Pulse: 71  Temp: 98.4 F (36.9 C)  SpO2: (S) 92%  Weight: 122 lb 3.2 oz (55.4 kg)  Height: 4' 11 (1.499 m)   Body mass index is 24.68 kg/m. Physical Exam Vitals and nursing note reviewed.  Constitutional:      Appearance: Normal appearance.  HENT:     Head: Normocephalic and atraumatic.      Right Ear: Tympanic membrane normal.     Left Ear: Tympanic membrane normal.     Nose: No congestion or rhinorrhea.     Mouth/Throat:     Mouth: Mucous membranes are moist.     Pharynx: No oropharyngeal exudate or posterior oropharyngeal erythema.     Comments: Red and enlarged tonsils.  Eyes:     Extraocular Movements: Extraocular movements intact.     Conjunctiva/sclera: Conjunctivae normal.     Pupils: Pupils are equal, round, and reactive to light.  Cardiovascular:     Rate and Rhythm: Normal rate. Rhythm irregular.     Heart sounds: No murmur heard. Pulmonary:     Breath sounds: Examination of the right-lower field reveals decreased breath sounds. Examination of the left-lower field reveals rales. Decreased breath sounds and rales present. No wheezing.     Comments: s/p right lung lobectomy 2017. Right lumpectomy 30 years ago. Posterior left basilar rales.  Abdominal:     General: Bowel sounds are normal.     Palpations: Abdomen is soft.     Tenderness: There is no abdominal tenderness.  Musculoskeletal:     Cervical back: Normal range of motion.     Right lower leg: No edema.     Left lower leg: No edema.  Skin:    General: Skin is warm and dry.  Neurological:     General: No focal deficit present.     Mental Status: She is alert and oriented to person, place, and time. Mental status is at baseline.     Gait: Gait abnormal.  Psychiatric:        Mood and Affect: Mood normal.        Behavior: Behavior normal.        Thought Content: Thought content normal.        Judgment: Judgment normal.     Labs reviewed: Recent Labs    11/05/23 0713 11/12/23 0723 11/26/23 0718 04/28/24 0725 06/05/24 1041  NA 143   < > 133* 138 134*  K 5.5*   < > 4.7 4.3 4.0  CL 103   < > 93* 96* 97*  CO2 18*   < > 33* 31 27  GLUCOSE 71   < > 89 77 100*  BUN 20   < > 22 20 13   CREATININE 1.14*   < > 1.10* 1.09* 0.88  CALCIUM  9.6   < > 9.6 9.5 9.2  MG 3.2*  --   --   --   --    < > =  values in this interval not displayed.   Recent Labs    11/05/23 0713 11/12/23 0723 06/05/24 1041  AST 38* 25 26  ALT 10 13 11   ALKPHOS  --   --  53  BILITOT  1.3* 1.2 1.5*  PROT 7.2 6.8 6.5  ALBUMIN  --   --  3.8   Recent Labs    11/05/23 0713 06/05/24 1041  WBC 3.8 3.9*  NEUTROABS 2,850 3.2  HGB 11.5* 11.3*  HCT 36.0 36.2  MCV 98.4 102.3*  PLT 127* 138*   Lab Results  Component Value Date   TSH 1.450 11/16/2022   No results found for: HGBA1C Lab Results  Component Value Date   CHOL 137 11/05/2023   HDL 65 11/05/2023   LDLCALC 58 11/05/2023   LDLDIRECT 107.8 04/09/2007   TRIG 63 11/05/2023   CHOLHDL 2.1 11/05/2023    Significant Diagnostic Results in last 30 days:  No results found.  Assessment/Plan: SOB (shortness of breath) Euvolemic Progressing SOB Will CXR ap/lateral, BNP, CMP/eGFR, TSH, CBC/diff, Vit B12, Vit D, Iron/ferritin, lipids.  Continue O2 via [place] Pulmonary vs cardiac vs anemia?  Hyponatremia  Na 134 06/05/24, had Nephrology evaluation. 2046ml/day fluid restriction.   Essential hypertension Blood pressure is controlled,  takes Losartan , Coreg , Rosuvastatin .  Atrial fibrillation, chronic (HCC) Heart rate is in control takes Coreg , Eliquis   Congestive heart failure (CHF) (HCC) chronic edema BLE, near none,  takes Spirolactone, Torsemide , EF 60-65%, hospitalized 05/30/20-06/08/20 for acute on CHF,  . F/u cardiology,  on 2054ml/day fluid restriction.  CKD (chronic kidney disease) stage 3, GFR 30-59 ml/min (HCC) Bun/creat 13/0.88 06/05/24  Slow transit constipation Stable takes MiraLax , Bisacodyl .   GERD (gastroesophageal reflux disease) Stable  takes Pantoprazole , bloated abd sometimes, takes Simethicone   IDA (iron deficiency anemia)  Hgb 11.3 06/05/24,  off Fe  Hyperlipidemia LDL 58 11/05/23, takes Rosuvastatin .     Family/ staff Communication: plan of care reviewed with the patient and the patient's daughter-HPOA  Labs/tests  ordered:  CXR ap/lateral, CBC/diff, CMP/eGFR, TSH, lipids, Vit B12, Vit D, Iron+ferritin      [1]  Allergies Allergen Reactions   Aspirin Rash

## 2024-09-24 NOTE — ED Notes (Signed)
 Reviewed AVS/discharge instructions with patient. Time allotted for and all questions answered. Patient is agreeable for d/c and escorted to ED exit by staff.

## 2024-09-24 NOTE — Discharge Instructions (Addendum)
 Increase your torsemide  to 20 mg twice a day for the next 4 days and then follow-up with your primary care doctor.  Return if symptoms worsen as we discussed.  Follow-up with cardiology as well.

## 2024-09-24 NOTE — ED Provider Notes (Signed)
 [place] EMERGENCY DEPARTMENT AT [facility] Provider Note   CSN: [id number] Arrival date & time: 09/24/24  1717     Patient presents with: Shortness of Breath   [name] is a 89 y.o. female.   Patient here with shortness of breath for the last month.  History of lung cancer A-fib on Eliquis .  She has had breast cancer as well.  Not on any chemotherapy.  She wears home O2.  Patient sent here by primary care for further evaluation.  Patient does have heart failure as well.  Some mild leg swelling.  She denies any fever chills cough sputum production.  Does not use any inhalers.  Does not smoke.  The history is provided by the patient.       Prior to Admission medications  Medication Sig Start Date End Date Taking? Authorizing Provider  acetaminophen  (TYLENOL ) 500 MG tablet Take 2 tablets (1,000 mg total) by mouth every 6 (six) hours as needed for mild pain. 01/17/16   Dwan Kyla HERO, PA-C  apixaban  (ELIQUIS ) 2.5 MG TABS tablet Take 1 tablet (2.5 mg total) by mouth 2 (two) times daily. 09/27/23   O'NealDarryle Ned, MD  Calcium  Carbonate-Vitamin D  600-400 MG-UNIT tablet Take 1 tablet by mouth daily.    [provider]  carvedilol  (COREG ) 3.125 MG tablet Take 1 tablet (3.125 mg total) by mouth 2 (two) times daily with a meal. 09/27/23   O'Neal, Darryle Ned, MD  gabapentin  (NEURONTIN ) 100 MG capsule Take 1 capsule (100 mg total) by mouth at bedtime. 01/17/24   Sherlynn Madden, MD  lactose free nutrition (BOOST) LIQD Take 237 mLs by mouth daily.    [provider]  losartan  (COZAAR ) 25 MG tablet Take 1 tablet (25 mg total) by mouth daily. 11/06/23   Sherlynn Madden, MD  Omega-3 Fatty Acids (FISH OIL) 1000 MG CAPS Take 1 capsule by mouth daily.    [provider]  OXYGEN Inhale 2 L into the lungs at bedtime.    [provider]  pantoprazole  (PROTONIX ) 40 MG tablet TAKE ONE TABLET BY MOUTH DAILY 05/25/24   Veludandi,  Prashanthi, MD  polyethylene glycol (MIRALAX  / GLYCOLAX ) 17 g packet Take 17 g by mouth daily as needed for moderate constipation. 07/15/20   Mast, Man X, NP  Probiotic Product (ALIGN PO) Take by mouth.    [provider]  Propylene Glycol (SYSTANE COMPLETE) 0.6 % SOLN Place 1 drop into both eyes daily.    [provider]  rosuvastatin  (CRESTOR ) 10 MG tablet Take 1 tablet (10 mg total) by mouth at bedtime. 09/27/23   O'NealDarryle Ned, MD  simethicone  (GAS-X) 80 MG chewable tablet Chew 1 tablet (80 mg total) by mouth every 6 (six) hours as needed for flatulence. 02/07/22   Cleotilde Garnette HERO, MD  spironolactone  (ALDACTONE ) 25 MG tablet Take 0.5 tablet (12.5 mg total) by mouth daily. 04/29/24   O'NealDarryle Ned, MD  torsemide  (DEMADEX ) 20 MG tablet TAKE ONE TABLET BY MOUTH DAILY 06/30/24   O'Neal, Darryle Ned, MD    Allergies: Aspirin    Review of Systems  Updated Vital Signs BP (!) 143/92   Pulse 70   Temp 98.3 F (36.8 C)   Resp 18   LMP 10/16/1975   SpO2 94%   Physical Exam Vitals and nursing note reviewed.  Constitutional:      General: She is not in acute distress.    Appearance: She is well-developed.  HENT:  Head: Normocephalic and atraumatic.  Eyes:     Extraocular Movements: Extraocular movements intact.     Conjunctiva/sclera: Conjunctivae normal.     Pupils: Pupils are equal, round, and reactive to light.  Cardiovascular:     Rate and Rhythm: Normal rate. Rhythm irregular.     Pulses: Normal pulses.     Heart sounds: No murmur heard. Pulmonary:     Effort: Pulmonary effort is normal. No respiratory distress.     Breath sounds: Normal breath sounds. No decreased breath sounds or wheezing.  Abdominal:     Palpations: Abdomen is soft.     Tenderness: There is no abdominal tenderness.  Musculoskeletal:        General: No swelling.     Cervical back: Normal range of motion and neck supple.     Right lower leg: Edema present.     Left  lower leg: Edema present.  Skin:    General: Skin is warm and dry.     Capillary Refill: Capillary refill takes less than 2 seconds.  Neurological:     General: No focal deficit present.     Mental Status: She is alert.  Psychiatric:        Mood and Affect: Mood normal.     (all labs ordered are listed, but only abnormal results are displayed) Labs Reviewed  CBC WITH DIFFERENTIAL/PLATELET - Abnormal; Notable for the following components:      Result Value   WBC 3.9 (*)    RBC 3.36 (*)    Hemoglobin 11.0 (*)    HCT 34.6 (*)    MCV 103.0 (*)    Platelets 135 (*)    Lymphs Abs 0.6 (*)    All other components within normal limits  BASIC METABOLIC PANEL WITH GFR - Abnormal; Notable for the following components:   Chloride 95 (*)    CO2 34 (*)    Creatinine, Ser 1.10 (*)    GFR, Estimated 48 (*)    All other components within normal limits  PRO BRAIN NATRIURETIC PEPTIDE - Abnormal; Notable for the following components:   Pro Brain Natriuretic Peptide 4,348.0 (*)    All other components within normal limits  TROPONIN T, HIGH SENSITIVITY - Abnormal; Notable for the following components:   Troponin T High Sensitivity 28 (*)    All other components within normal limits  TROPONIN T, HIGH SENSITIVITY - Abnormal; Notable for the following components:   Troponin T High Sensitivity 24 (*)    All other components within normal limits  RESP PANEL BY RT-PCR (RSV, FLU A&B, COVID)  RVPGX2    EKG: EKG Interpretation Date/Time:  Thursday September 24 2024 18:02:37 EST Ventricular Rate:  64 PR Interval:    QRS Duration:  83 QT Interval:  440 QTC Calculation: 454 R Axis:   24  Text Interpretation: Atrial fibrillation Confirmed by Ruthe Cornet [phone]) on 09/24/2024 6:32:31 PM  Radiology: [facility] Chest Portable 1 View Result Date: 09/24/2024 CLINICAL DATA:  Cough EXAM: PORTABLE CHEST 1 VIEW COMPARISON:  06/05/2024, CT 12/05/2022 FINDINGS: Cardiomegaly with aortic atherosclerosis. Small  chronic right pleural effusion. No focal opacity or pneumothorax IMPRESSION: Cardiomegaly with small chronic right pleural effusion. Electronically Signed   By: Luke Bun M.D.   On: 09/24/2024 19:10     Procedures   Medications Ordered in the ED  furosemide  (LASIX ) injection 60 mg (60 mg Intravenous Given 09/24/24 1917)  Medical Decision Making Amount and/or Complexity of Data Reviewed Labs: ordered. Radiology: ordered.  Risk Prescription drug management.   [name] is here with shortness of breath.  History of A-fib on Eliquis .  History of lung cancer wears nighttime oxygen.  She is not on any chemotherapy.  Symptoms for about a month.  No leg swelling.  Does have history of heart failure.  No smoking history.  Differential diagnosis could be volume overload could be infectious process seems less likely to be ACS as she is not having any chest pain.  She has a EKG that shows sinus rhythm.  No ischemic changes.  She is on anticoagulation and doubt PE.  Overall we will check basic labs including troponin EKG chest x-ray COVID flu RSV test and reevaluate.  Chest x-ray per radiology report showed cardiomegaly with small chronic right pleural effusion.  Heart failure lab proBNP was elevated to 4000.  Troponin x 2 were unremarkable.  28 and 24.  No ischemic changes on EKG.  Is not having any chest pain.  Doubt ACS.  She had no significant leukocytosis anemia or electrolyte abnormality otherwise.  COVID flu RSV test was negative.  I gave her a dose of IV Lasix .  I offered her admission to get more IV diuretics and further supportive care.  She is not hypoxic here.  She wears oxygen at night.  But she does not have portable oxygen.  Ultimately she was able to ambulate and maintain oxygen above 89%.  She is not having any increased work of breathing.  Ultimately she made decision to go home and we will have her double her torsemide  for the next several days  and follow-up with her primary care doctor/cardiology.  Told to return if symptoms worsen or if she change her mind about admission.  I think she stable for discharge but I think she would have benefited from an observation stay as well.  She understands the risks and benefits.  Family was at the bedside as well and they are all agreeable with plan.  Discharge.  This chart was dictated using voice recognition software.  Despite best efforts to proofread,  errors can occur which can change the documentation meaning.      Final diagnoses:  Acute on chronic congestive heart failure, unspecified heart failure type [facility])    ED Discharge Orders     None          Ruthe Cornet, DO 09/24/24 2038

## 2024-09-24 NOTE — Assessment & Plan Note (Signed)
 Stable takes MiraLax , Bisacodyl .

## 2024-09-24 NOTE — Assessment & Plan Note (Signed)
 chronic edema BLE, near none,  takes Spirolactone, Torsemide, EF 60-65%, hospitalized 05/30/20-06/08/20 for acute on CHF,  . F/u cardiology,  on 2037ml/day fluid restriction.

## 2024-09-24 NOTE — ED Notes (Signed)
 Ambulated with patient to bathroom, she dropped to 89% O2.

## 2024-09-24 NOTE — Assessment & Plan Note (Signed)
 LDL 58 11/05/23, takes Rosuvastatin .

## 2024-09-24 NOTE — Assessment & Plan Note (Signed)
 Stable  takes Pantoprazole , bloated abd sometimes, takes Simethicone 

## 2024-09-24 NOTE — Assessment & Plan Note (Signed)
 Na 134 06/05/24, had Nephrology evaluation. 2036ml/day fluid restriction.

## 2024-09-24 NOTE — ED Triage Notes (Signed)
 Sob x 1 month, seen by PCP sent for eval with cxr Dizzy at times  The doctor heard a rattle and send me here  On o2 at home during night

## 2024-09-24 NOTE — Assessment & Plan Note (Addendum)
 Euvolemic Progressing SOB Will CXR ap/lateral, BNP, CMP/eGFR, TSH, CBC/diff, Vit B12, Vit D, Iron/ferritin, lipids.  Continue O2 via [place] [facility] vs cardiac vs anemia?

## 2024-09-24 NOTE — Assessment & Plan Note (Signed)
 Hgb 11.3 06/05/24,  off Fe

## 2024-09-24 NOTE — Assessment & Plan Note (Signed)
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 Small table lung nodules, no need for continued f/u CT scan.

## 2024-09-24 NOTE — Assessment & Plan Note (Signed)
 Heart rate is in control takes Coreg , Eliquis 

## 2024-09-25 ENCOUNTER — Telehealth: Payer: Self-pay

## 2024-09-25 NOTE — Telephone Encounter (Signed)
 Patient was seen at the [facility] clinic yesterday, presented with a letter from [employer], and mentioned that they need an order for her to re-certify for oxygen.  I called [employer] twice yesterday and the phone rang busy.   Outgoing call placed to [employer] this morning [phone]) to request fax number and I was told that we do not need to submit an order. Per the representative with [employer] the letter was to prompt the patient to call them, verify her insurance, and verify her PCP.  [employer] representative states they will fax us  the re-certification paperwork (fax number confirmed) to re-certify patient for oxygen use for 2026.   Patient aware of the above

## 2024-09-29 ENCOUNTER — Other Ambulatory Visit: Payer: Self-pay | Admitting: Cardiovascular Disease

## 2024-10-01 LAB — COMPLETE METABOLIC PANEL WITHOUT GFR
AG Ratio: 1.9 (calc) (ref 1.0–2.5)
ALT: 12 U/L (ref 6–29)
AST: 24 U/L (ref 10–35)
Albumin: 4.4 g/dL (ref 3.6–5.1)
Alkaline phosphatase (APISO): 67 U/L (ref 37–153)
BUN/Creatinine Ratio: 25 (calc) — ABNORMAL HIGH (ref 6–22)
BUN: 27 mg/dL — ABNORMAL HIGH (ref 7–25)
CO2: 34 mmol/L — ABNORMAL HIGH (ref 20–32)
Calcium: 9.5 mg/dL (ref 8.6–10.4)
Chloride: 95 mmol/L — ABNORMAL LOW (ref 98–110)
Creat: 1.1 mg/dL — ABNORMAL HIGH (ref 0.60–0.95)
Globulin: 2.3 g/dL (ref 1.9–3.7)
Glucose, Bld: 93 mg/dL (ref 65–99)
Potassium: 5 mmol/L (ref 3.5–5.3)
Sodium: 137 mmol/L (ref 135–146)
Total Bilirubin: 1.1 mg/dL (ref 0.2–1.2)
Total Protein: 6.7 g/dL (ref 6.1–8.1)

## 2024-10-01 LAB — LIPID PANEL
Cholesterol: 123 mg/dL (ref <200)
HDL: 61 mg/dL (ref >=50)
LDL Cholesterol (Calc): 49 mg/dL
Non-HDL Cholesterol (Calc): 62 mg/dL (ref <130)
Total CHOL/HDL Ratio: 2 (calc) (ref <5.0)
Triglycerides: 58 mg/dL (ref <150)

## 2024-10-01 LAB — IRON,TIBC AND FERRITIN PANEL
%SAT: 15 % — ABNORMAL LOW (ref 16–45)
Ferritin: 34 ng/mL (ref 16–288)
Iron: 58 ug/dL (ref 45–160)
TIBC: 388 ug/dL (ref 250–450)

## 2024-10-01 LAB — TSH+FREE T4: TSH W/REFLEX TO FT4: 1.46 m[IU]/L (ref 0.40–4.50)

## 2024-10-01 LAB — VITAMIN D 25 HYDROXY (VIT D DEFICIENCY, FRACTURES): Vit D, 25-Hydroxy: 61 ng/mL (ref 30–100)

## 2024-10-01 LAB — VITAMIN B12: Vitamin B-12: 444 pg/mL (ref 200–1100)

## 2024-10-05 ENCOUNTER — Telehealth: Payer: Self-pay

## 2024-10-05 NOTE — Telephone Encounter (Signed)
 Copied from CRM [id number]. Topic: Clinical - Medical Advice >> Oct 05, 2024  2:44 PM [name] wrote: Reason for CRM: Patient called in stating that she has an appt tomorrow to have lab work done. States that she went to the lab last Thursday but the nurse was only able to get one vial of blood & her vein ended up blown. She states her arm is so bruised that she thinks she may need to wait before trying to have labs drawn again and wants to reschedule. Informed her that she would need to speak with nurse at [facility]. She called looking for Verneita but there is no Verneita in the main clinic.

## 2024-10-05 NOTE — Telephone Encounter (Signed)
 Please advise

## 2024-10-05 NOTE — Telephone Encounter (Signed)
 Called  patient and left a message, informed her she could go to get labs on 10-20-2024 at 7 am at [facility].

## 2024-10-13 ENCOUNTER — Other Ambulatory Visit: Payer: Self-pay | Admitting: Cardiovascular Disease

## 2024-10-13 NOTE — Telephone Encounter (Signed)
 Prescription refill request for Eliquis  received. Indication:afib Last office visit:upcoming Scr: 1.10  12/25 Age:89 Weight:55.4  kg  Prescription refilled

## 2024-10-16 NOTE — Progress Notes (Signed)
 " Cardiology Office Note:  .   Date:  10/20/2024  ID:  [name], DOB [birth year], MRN [id number] PCP: Mast, Man X, NP  [facility] Providers Cardiologist:  Darryle ONEIDA Decent, MD   History of Present Illness: .    Chief Complaint  Patient presents with   Follow-up    [name] is a 89 y.o. female with below history who presents for follow-up.   History of Present Illness   [name] is an 89 year old female with permanent atrial fibrillation, HFpEF, and hypertension who presents for follow-up.  She has been experiencing shortness of breath since October, which worsens with activities such as curling her hair, putting dishes away, walking, or talking extensively. Occasionally, she feels lightheaded when taking deep breaths. In December, she visited the emergency room for a chest x-ray due to shortness of breath and was evaluated for a potential heart attack. Her physician assistant noted a 'little bit of rattling' in her lungs, raising concerns about a possible infection.  In August, she experienced a fall resulting in a head injury and arm laceration, which required emergency room care. Since the fall, she has not felt up to engaging in physical therapy and has been recovering slowly.  She wears compression hose at night but reports swelling in her legs during the day, with her ankles appearing swollen at night. Her legs feel 'tight' rather than significantly swollen.  Her current medications include Torasemide 20 mg daily, carvedilol  3.125 mg twice daily, losartan  25 mg daily, aldactone  12.5 mg daily, and Eliquis  2.5 mg twice daily for her atrial fibrillation and hypertension.  She has spinal stenosis and back pain, which limits her mobility and discourages her from moving. She takes gabapentin  at night for neuropathic pain.  She lives in an independent living facility and maintains a diet with careful attention to salt intake. She had a busy Christmas day with family  visits, which she found tiring.           Problem List 1. Permanent Afib -CHADSVASC=4 2. Carcinoid lung tumor  3. Cirrhosis  -on CT A/P 2020 4. Hyponatremia -SIADH? 5. HTN 6. HFpEF  -EF 60-65% 7. HLD -T chol 123, HDL 61, LDL 49, TG 58 7. [facility]     ROS: All other ROS reviewed and negative. Pertinent positives noted in the HPI.     Studies Reviewed: SABRA       TTE 12/06/2022  1. Left ventricular ejection fraction, by estimation, is 55 to 60%. The  left ventricle has normal function. The left ventricle has no regional  wall motion abnormalities. There is mild left ventricular hypertrophy.  Left ventricular diastolic parameters  are indeterminate.   2. Right ventricular systolic function is mildly reduced. The right  ventricular size is mild-moderately enlarged. There is moderately elevated  pulmonary artery systolic pressure. The estimated right ventricular  systolic pressure is 55.2 mmHg.   3. Left atrial size was severely dilated.   4. Right atrial size was severely dilated.   5. The mitral valve is degenerative. Trivial mitral valve regurgitation.  No evidence of mitral stenosis.   6. Tricuspid valve regurgitation is moderate.   7. The aortic valve is abnormal. There is mild calcification of the  aortic valve. There is mild thickening of the aortic valve. Aortic valve  regurgitation is trivial. Aortic valve sclerosis/calcification is present,  without any evidence of aortic  stenosis.   8. The inferior vena cava is  dilated in size with <50% respiratory  variability, suggesting right atrial pressure of 15 mmHg.   9. Cannot exclude a small PFO.  Physical Exam:   VS:  BP 138/72   Pulse 61   Ht 4' 11 (1.499 m)   Wt 122 lb 4.8 oz (55.5 kg)   LMP 10/16/1975   SpO2 100%   BMI 24.70 kg/m    Wt Readings from Last 3 Encounters:  10/20/24 122 lb 4.8 oz (55.5 kg)  09/24/24 122 lb 3.2 oz (55.4 kg)  08/07/24 122 lb (55.3 kg)    GEN: Well  nourished, well developed in no acute distress NECK: No JVD; No carotid bruits CARDIAC: irregular rhythm, no murmurs, rubs, gallops RESPIRATORY:  Clear to auscultation without rales, wheezing or rhonchi  ABDOMEN: Soft, non-tender, non-distended EXTREMITIES:  No edema; No deformity  ASSESSMENT AND PLAN: .   Assessment and Plan    Chronic diastolic heart failure Shortness of breath and lightheadedness likely due to deconditioning. No significant fluid overload or retention. Symptoms are likely more related to deconditioning.  - Increased torsemide  to 20 mg twice daily for two days, then 20 mg daily. - Continue aldactone  12.5 mg daily. - Encouraged increased physical activity, starting with walking up and down the hall. - Consider physical therapy if symptoms persist.  Permanent atrial fibrillation Rate and blood pressure controlled on current regimen. - Continue carvedilol  3.5 mg BID. - Continue Eliquis  2.5 mg BID.  Essential hypertension Well controlled with current medication. - Continue losartan  25 mg daily.                Follow-up: Return in about 6 months (around 04/19/2025).  Signed, Darryle DASEN. Barbaraann, MD, [facility]  [address] [place], [place] [address] [phone]  3:15 PM   "

## 2024-10-19 ENCOUNTER — Other Ambulatory Visit: Payer: Self-pay | Admitting: Cardiovascular Disease

## 2024-10-20 ENCOUNTER — Encounter: Payer: Self-pay | Admitting: Cardiovascular Disease

## 2024-10-20 ENCOUNTER — Ambulatory Visit: Attending: Cardiovascular Disease | Admitting: Cardiovascular Disease

## 2024-10-20 VITALS — BP 138/72 | HR 61 | Ht 59.0 in | Wt 122.3 lb

## 2024-10-20 DIAGNOSIS — I4821 Permanent atrial fibrillation: Secondary | ICD-10-CM

## 2024-10-20 DIAGNOSIS — I10 Essential (primary) hypertension: Secondary | ICD-10-CM | POA: Diagnosis not present

## 2024-10-20 DIAGNOSIS — I5032 Chronic diastolic (congestive) heart failure: Secondary | ICD-10-CM

## 2024-10-20 DIAGNOSIS — D6869 Other thrombophilia: Secondary | ICD-10-CM

## 2024-10-20 LAB — CBC WITH DIFFERENTIAL/PLATELET
Absolute Lymphocytes: 480 {cells}/uL — ABNORMAL LOW (ref 850–3900)
Absolute Monocytes: 449 {cells}/uL (ref 200–950)
Basophils Absolute: 22 {cells}/uL (ref 0–200)
Basophils Relative: 0.5 %
Eosinophils Absolute: 9 {cells}/uL — ABNORMAL LOW (ref 15–500)
Eosinophils Relative: 0.2 %
HCT: 35.8 % — ABNORMAL LOW (ref 35.9–46.0)
Hemoglobin: 11.5 g/dL — ABNORMAL LOW (ref 11.7–15.5)
MCH: 31.7 pg (ref 27.0–33.0)
MCHC: 32.1 g/dL (ref 31.6–35.4)
MCV: 98.6 fL (ref 81.4–101.7)
MPV: 9.1 fL (ref 7.5–12.5)
Monocytes Relative: 10.2 %
Neutro Abs: 3441 {cells}/uL (ref 1500–7800)
Neutrophils Relative %: 78.2 %
Platelets: 157 Thousand/uL (ref 140–400)
RBC: 3.63 Million/uL — ABNORMAL LOW (ref 3.80–5.10)
RDW: 12.4 % (ref 11.0–15.0)
Total Lymphocyte: 10.9 %
WBC: 4.4 Thousand/uL (ref 3.8–10.8)

## 2024-10-20 MED ORDER — CARVEDILOL 3.125 MG PO TABS: 3.1250 mg | ORAL_TABLET | Freq: Two times a day (BID) | ORAL | 3 refills | Status: AC

## 2024-10-20 NOTE — Patient Instructions (Addendum)
 Medication Instructions:  Please Increase your Torsemide  to 20 mg, one tablet, two times daily for TWO DAYS, then go back to your normal dose of 20 mg, once daily after the two days  Lab Work: None  Follow-Up: At [facility], you and your health needs are our priority.  As part of our continuing mission to provide you with exceptional heart care, our providers are all part of one team.  This team includes your primary Cardiologist (physician) and Advanced Practice Providers or APPs (Physician Assistants and Nurse Practitioners) who all work together to provide you with the care you need, when you need it.  Your next appointment:   6 month(s) (Due early July 2026; we will mail a reminder letter; please call for appointment around March 2026)  Provider:   Darryle ONEIDA Decent, MD

## 2024-10-27 ENCOUNTER — Ambulatory Visit: Payer: Self-pay | Admitting: Nurse Practitioner

## 2024-10-28 ENCOUNTER — Encounter: Payer: Self-pay | Admitting: Family

## 2024-10-28 ENCOUNTER — Ambulatory Visit (INDEPENDENT_AMBULATORY_CARE_PROVIDER_SITE_OTHER): Admitting: Family

## 2024-10-28 VITALS — BP 138/72 | HR 66 | Temp 97.6°F | Ht 59.0 in | Wt 124.4 lb

## 2024-10-28 DIAGNOSIS — H6123 Impacted cerumen, bilateral: Secondary | ICD-10-CM | POA: Diagnosis not present

## 2024-10-28 NOTE — Patient Instructions (Signed)
-   Notify provider for any ear pain,fever or chills. 

## 2024-10-28 NOTE — Progress Notes (Signed)
 "  Provider: Roxan Forest Redwine FNP-C  Mast, Man X, NP  Patient Care Team: Mast, Man X, NP as PCP - General (Internal Medicine) O'Neal, Darryle Ned, MD as PCP - Cardiology (Cardiology) Cleotilde Ronal RAMAN, MD (Gynecology) Melodye Coy, MD (Inactive) (Hematology and Oncology) Celestia Agent, MD (Inactive) (Gastroenterology) Mast, Man X, NP as Nurse Practitioner (Internal Medicine)  Extended Emergency Contact Information Primary Emergency Contact: Filippini,[name] Address: [address]          [place], [place] [address] [place] Home Phone: [phone] Relation: Son Secondary Emergency Contact: [name],[name] Address: [address]          [place], [place] [address] [place] Home Phone: [phone] Mobile Phone: [phone] Relation: Daughter  Code Status:  Full Code  Goals of care: Advanced Directive information    09/24/2024    4:03 PM  Advanced Directives  Does Patient Have a Medical Advance Directive? Yes  Type of Estate Agent of [place];Living will  Does patient want to make changes to medical advance directive? No - Patient declined     Chief Complaint  Patient presents with   Cerumen Impaction    B/L ear lavage.     History of Present Illness   [name] is an 89 year old female who presents for ear cleaning due to earwax buildup affecting her hearing aids. Has an appointment tomorrow for hearing testing.   She has earwax buildup in both ears, which affects her hearing aids. She has previously used Debrox drops, but the hearing aids tend to push the wax further in. No ear pain is reported, although a spot on the left ear was slightly painful during cleaning. No fever is present.  She is on a blood thinner Eliquis . No pain is experienced when wearing the hearing aids.  She resides in [facility] independent living and is visited by family members, including her daughter and younger brother, at least once a  month. Her appetite is not strong, but she is making healthy food choices and supplements with Boost to maintain nutrition.  No ear pain, fever, forehead pain, or tenderness in the lymph nodes.   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   Right lower lobectomy   Diverticulitis 1999   Femur fracture (HCC) 03/2013   left, non weight bearing for 2 1/2 weeks   GERD (gastroesophageal reflux disease)    Hemorrhoids 1991   HTN (hypertension)    Lymphedema of upper extremity    right arm   Osteoporosis    PONV (postoperative nausea and vomiting)    Urinary, incontinence, stress female    Vitamin D  deficiency disease    Past Surgical History:  Procedure Laterality Date   APPENDECTOMY     BREAST BIOPSY  05/04/1987   Right Dr Neysa   BREAST IMPLANT REMOVAL Right 06/05/2010   BUNIONECTOMY  10/15/1986   carcinoid Right    Right lower lobectomy 2017   COLONOSCOPY     COLONOSCOPY W/ POLYPECTOMY     EYE SURGERY  5/14 ; 6/14   Cataract Surgery   JOINT REPLACEMENT     2002 left; 2009 right Hip, rt knee 2012   KNEE ARTHROPLASTY Right 10/15/2010   Darryle Long   LOBECTOMY  Right 01/05/2016   Procedure: RIGHT LOWER LOBE LOBECTOMY;  Surgeon: Elspeth JAYSON Millers, MD;  Location[facility];  Service: Thoracic;  Laterality: Right;   MASTECTOMY PARTIAL / LUMPECTOMY W/ AXILLARY LYMPHADENECTOMY  04/05/1987   Right - Dr Neysa   RESECTION OF MEDIASTINAL MASS N/A 01/05/2016   Procedure: RESECTION OF PLEURAL MASS, right;  Surgeon: Elspeth JAYSON Millers, MD;  Location: [facility] OR;  Service: Thoracic;  Laterality: N/A;   TONSILLECTOMY     TOTAL HIP ARTHROPLASTY     TUBAL LIGATION Bilateral 10/15/1968   VAGINAL HYSTERECTOMY  10/16/1975   myomata   VIDEO ASSISTED THORACOSCOPY (VATS)/WEDGE RESECTION Right 01/05/2016    Procedure: RIGHT VIDEO ASSISTED THORACOSCOPY (VATS)/WEDGE RESECTION;  Surgeon: Elspeth JAYSON Millers, MD;  Location: [facility] OR;  Service: Thoracic;  Laterality: Right;    Allergies[1]  Outpatient Encounter Medications as of 10/28/2024  Medication Sig   acetaminophen  (TYLENOL ) 500 MG tablet Take 2 tablets (1,000 mg total) by mouth every 6 (six) hours as needed for mild pain.   Calcium  Carbonate-Vitamin D  600-400 MG-UNIT tablet Take 1 tablet by mouth daily.   carvedilol  (COREG ) 3.125 MG tablet Take 1 tablet (3.125 mg total) by mouth 2 (two) times daily with a meal.   ELIQUIS  2.5 MG TABS tablet Take 1 tablet (2.5 mg total) by mouth 2 (two) times daily.   gabapentin  (NEURONTIN ) 100 MG capsule Take 1 capsule (100 mg total) by mouth at bedtime.   lactose free nutrition (BOOST) LIQD Take 237 mLs by mouth daily.   losartan  (COZAAR ) 25 MG tablet Take 1 tablet (25 mg total) by mouth daily.   Omega-3 Fatty Acids (FISH OIL) 1000 MG CAPS Take 1 capsule by mouth daily.   OXYGEN Inhale 2 L into the lungs at bedtime.   pantoprazole  (PROTONIX ) 40 MG tablet TAKE ONE TABLET BY MOUTH DAILY   polyethylene glycol (MIRALAX  / GLYCOLAX ) 17 g packet Take 17 g by mouth daily as needed for moderate constipation.   Probiotic Product (ALIGN PO) Take by mouth.   Propylene Glycol (SYSTANE COMPLETE) 0.6 % SOLN Place 1 drop into both eyes daily.   rosuvastatin  (CRESTOR ) 10 MG tablet Take 1 tablet (10 mg total) by mouth at bedtime.   simethicone  (GAS-X) 80 MG chewable tablet Chew 1 tablet (80 mg total) by mouth every 6 (six) hours as needed for flatulence.   spironolactone  (ALDACTONE ) 25 MG tablet TAKE 1/2 TABLET BY MOUTH DAILY   torsemide  (DEMADEX ) 20 MG tablet TAKE ONE TABLET BY MOUTH DAILY   No facility-administered encounter medications on file as of 10/28/2024.    Review of Systems  Constitutional:  Negative for appetite change, chills, fatigue, fever and unexpected weight change.  HENT:  Positive for hearing loss.  Negative for congestion, dental problem, ear discharge, ear pain, nosebleeds, postnasal drip, rhinorrhea, sinus pressure, sinus pain, sneezing, sore throat and tinnitus.   Eyes:  Negative for pain, discharge, redness, itching and visual disturbance.  Respiratory:  Negative for cough, chest tightness, shortness of breath and wheezing.   Cardiovascular:  Negative for chest pain, palpitations and leg swelling.  Musculoskeletal:  Positive for gait problem. Negative for arthralgias, back pain, joint swelling and myalgias.  Skin:  Negative for color change, pallor and rash.  Neurological:  Negative for dizziness, weakness, light-headedness and headaches.    Immunization History  Administered Date(s) Administered   Fluad Quad(high Dose 65+) 07/31/2021   Fluad Trivalent(High Dose 65+) 07/30/2024   INFLUENZA, HIGH DOSE SEASONAL PF 07/27/2020, 08/21/2022, 08/14/2023   Influenza-Unspecified 06/30/2016  Moderna Covid-19 Vaccine Bivalent Booster 25yrs & up 08/14/2023   Moderna Sars-Covid-2 Vaccination 10/19/2019, 11/16/2019   Pneumococcal Polysaccharide-23 12/16/2001   Pneumococcal-Unspecified 07/30/2008, 10/22/2014   Td 12/16/2001   Tdap 10/15/2009, 06/11/2024   Tetanus 05/09/2010   Zoster Recombinant(Shingrix) 04/24/2021   Pertinent  Health Maintenance Due  Topic Date Due   Mammogram  01/19/2023   Influenza Vaccine  Completed   Bone Density Scan  Completed      01/17/2024   10:15 AM 04/24/2024   10:07 AM 07/03/2024   10:32 AM 09/24/2024    4:03 PM 10/28/2024    2:22 PM  Fall Risk  Falls in the past year?  0 1 1 1   Was there an injury with Fall? 1  1  1  1 1   Was there an injury with Fall? - Comments    Head injury and blacked eye, skin tear on right arm   Fall Risk Category Calculator  1 2 2 3   Patient at Risk for Falls Due to   No Fall Risks History of fall(s) History of fall(s)  Fall risk Follow up   Falls evaluation completed Falls evaluation completed Falls evaluation completed      Data saved with a previous flowsheet row definition   Functional Status Survey:    Vitals:   10/28/24 1424  BP: 138/72  Pulse: 66  Temp: 97.6 F (36.4 C)  SpO2: 96%  Weight: 124 lb 6.4 oz (56.4 kg)  Height: 4' 11 (1.499 m)   Body mass index is 25.13 kg/m. Physical Exam  VITALS: T- 97.6, P- 66, BP- 138/72, SaO2- 96% MEASUREMENTS: Weight- 124 lbs. GENERAL: Alert, cooperative, well developed, no acute distress. HEENT: Normocephalic, normal oropharynx, moist mucous membranes. Eardrums normal. Left ear canal bleed during ear lavage. CMA stopped lavage. Bleeding resolved. Right ear canal also had some bleeding where cerumen was impacted but has stopped during exam .No frontal or maxillary sinus tenderness. NECK: No cervical lymphadenopathy. CHEST: Clear to auscultation bilaterally. No wheezes, rhonchi, or crackles. CARDIOVASCULAR: Normal heart rate and rhythm, S1 and S2 normal without murmurs. ABDOMEN: Soft, non-tender, non-distended, without organomegaly. Normal bowel sounds. NEUROLOGICAL: Cranial nerves grossly intact, moves all extremities without gross motor or sensory deficit Unsteady gait walks with a rolling walker.   Labs reviewed: Recent Labs    11/05/23 0713 11/12/23 0723 06/05/24 1041 09/24/24 1839 10/01/24 0710  NA 143   < > 134* 137 137  K 5.5*   < > 4.0 4.2 5.0  CL 103   < > 97* 95* 95*  CO2 18*   < > 27 34* 34*  GLUCOSE 71   < > 100* 98 93  BUN 20   < > 13 23 27*  CREATININE 1.14*   < > 0.88 1.10* 1.10*  CALCIUM  9.6   < > 9.2 9.6 9.5  MG 3.2*  --   --   --   --    < > = values in this interval not displayed.   Recent Labs    11/12/23 0723 06/05/24 1041 10/01/24 0710  AST 25 26 24   ALT 13 11 12   ALKPHOS  --  53  --   BILITOT 1.2 1.5* 1.1  PROT 6.8 6.5 6.7  ALBUMIN  --  3.8  --    Recent Labs    06/05/24 1041 09/24/24 1839 10/20/24 0712  WBC 3.9* 3.9* 4.4  NEUTROABS 3.2 2.8 3,441  HGB 11.3* 11.0* 11.5*  HCT 36.2 34.6* 35.8*  MCV 102.3*  103.0* 98.6  PLT 138* 135* 157   Lab Results  Component Value Date   TSH 1.450 11/16/2022   No results found for: HGBA1C Lab Results  Component Value Date   CHOL 123 10/01/2024   HDL 61 10/01/2024   LDLCALC 49 10/01/2024   LDLDIRECT 107.8 04/09/2007   TRIG 58 10/01/2024   CHOLHDL 2.0 10/01/2024    Significant Diagnostic Results in last 30 days:  No results found.  Assessment/Plan  Impacted cerumen, bilateral Bilateral impacted cerumen with left ear erythema and bleeding, likely due to cerumen impaction. Bleeding has ceased, . No pain, fever, or chills. On anticoagulants, increasing bleeding risk. Hearing aids may exacerbate impaction. - Continue Debrox drops to soften cerumen. - Monitor for fever, chills, or pain. - Ensure regular ear checks every 4-6 months. - Consider periodic ear cleaning to prevent cerumen buildup. - Advised to notify provider for any ear pain, fever or chills  - Has upcoming hearing test in the morning   Family/ staff Communication: Reviewed plan of care with patient verbalized understanding   Labs/tests ordered: None   Next Appointment: Return if symptoms worsen or fail to improve.  Total time: 20 minutes. Greater than 50% of total time spent doing patient education regarding bilateral cerumen impaction and health maintenance including symptom/medication management.   Rexanne Inocencio C Lorrayne Ismael, NP    [1]  Allergies Allergen Reactions   Aspirin Rash   "

## 2024-11-05 ENCOUNTER — Ambulatory Visit: Payer: Self-pay

## 2024-11-05 NOTE — Telephone Encounter (Signed)
 Mast, Man X, NP to Me  (Selected Message)     11/05/24  4:26 PM I agree Thanks  Noted.

## 2024-11-05 NOTE — Telephone Encounter (Addendum)
 Message from [place] sent at 11/05/2024  3:17 PM EST  Summary: UTI   Reason for Triage: Patient daughter Darice called and is wanting the patient to come in and get checked for a UTI. She is not running a fever, she is not in any pain at the moment. Patient symptoms is urgency to go but when she goes she has to wait and the stream is not strong. Could you assist? Patients daughter callback number is [phone].         Reason for Disposition  Urinating more frequently than usual (i.e., frequency) OR new-onset of the feeling of an urgent need to urinate (i.e., urgency)  Answer Assessment - Initial Assessment Questions 1. SYMPTOM: What's the main symptom you're concerned about? (e.g., frequency, incontinence)     Urge, denies cloudy, blood, odoer 2. ONSET: When did the    start?     4 weeks, worse last 2 weeks 3. PAIN: Is there any pain? If Yes, ask: How bad is it? (Scale: 1-10; mild, moderate, severe)     no 4. CAUSE: What do you think is causing the symptoms?     uti 5. OTHER SYMPTOMS: Do you have any other symptoms? (e.g., blood in urine, fever, flank pain, pain with urination)  Denies  Leg swelling; compressoin hoses, swellin from feet to lower legs, denies redness; swelling returnes thourghout the day.  Protocols used: Urinary Symptoms-A-AH Denies diff breathing, chest pain, faint, fever chills, n/v, flank pain, abd pain.

## 2024-11-05 NOTE — Telephone Encounter (Signed)
 See triage notes from triage nurse. Patient does have an appointment to be seen in office with another provider.

## 2024-11-05 NOTE — Telephone Encounter (Signed)
 called CALSpoke to Darice, okay to transfer for scheduling, need appt 24 hours

## 2024-11-06 ENCOUNTER — Ambulatory Visit: Admitting: Adult Health

## 2024-11-06 ENCOUNTER — Encounter: Payer: Self-pay | Admitting: Adult Health

## 2024-11-06 VITALS — BP 128/62 | HR 56 | Temp 97.3°F | Ht 59.0 in | Wt 124.4 lb

## 2024-11-06 DIAGNOSIS — I5032 Chronic diastolic (congestive) heart failure: Secondary | ICD-10-CM

## 2024-11-06 DIAGNOSIS — N183 Chronic kidney disease, stage 3 unspecified: Secondary | ICD-10-CM

## 2024-11-06 DIAGNOSIS — I482 Chronic atrial fibrillation, unspecified: Secondary | ICD-10-CM | POA: Diagnosis not present

## 2024-11-06 DIAGNOSIS — R3915 Urgency of urination: Secondary | ICD-10-CM | POA: Diagnosis not present

## 2024-11-06 DIAGNOSIS — N390 Urinary tract infection, site not specified: Secondary | ICD-10-CM

## 2024-11-06 DIAGNOSIS — I129 Hypertensive chronic kidney disease with stage 1 through stage 4 chronic kidney disease, or unspecified chronic kidney disease: Secondary | ICD-10-CM | POA: Diagnosis not present

## 2024-11-06 DIAGNOSIS — I10 Essential (primary) hypertension: Secondary | ICD-10-CM

## 2024-11-06 DIAGNOSIS — Z86012 Personal history of benign carcinoid tumor: Secondary | ICD-10-CM

## 2024-11-06 DIAGNOSIS — G629 Polyneuropathy, unspecified: Secondary | ICD-10-CM

## 2024-11-06 LAB — POCT URINE DIPSTICK
Blood, UA: NEGATIVE
Glucose, UA: NEGATIVE mg/dL
Ketones, POC UA: NEGATIVE mg/dL
Leukocytes, UA: NEGATIVE
Nitrite, UA: NEGATIVE
Spec Grav, UA: 1.01
Urobilinogen, UA: 0.2 U/dL
pH, UA: 5

## 2024-11-06 NOTE — Progress Notes (Unsigned)
 "  [facility]  Provider:  Jereld Serum DNP  Code Status:  DNR  Goals of Care:     09/24/2024    4:03 PM  Advanced Directives  Does Patient Have a Medical Advance Directive? Yes  Type of Estate Agent of [place];Living will  Does patient want to make changes to medical advance directive? No - Patient declined     Chief Complaint  Patient presents with   Urinary Tract Infection    Ongoing  several weeks. Frequent, no burning, no odor   Referral    Request referral to pulmonary. Son notice some wheezing and shortness of breath at times.     Discussed the use of AI scribe software for clinical note transcription with the patient, who gave verbal consent to proceed.   HPI: Patient is a 89 y.o. female seen today for an acute visit for++++ urinary frequency and possible UTI. She is accompanied by her son.  She experiences urinary frequency without burning sensation during urination, although there is some burning after urination. She has episodes of incontinence, sometimes unable to reach the bathroom in time, and occasionally urinates upon standing. She manages these symptoms by wearing Depends. The urinary issues have been gradually worsening over more than a week. She gets up at night two to three times to urinate. She has a history of pelvic floor issues following a hysterectomy and childbirth.  She has a history of a carcinoid tumor in the right lower lobe of her lung, for which she underwent a lobectomy in 2017. Recently, she has experienced increased shortness of breath and wheezing over the past year, which has worsened. She uses oxygen at two liters per night and finds it difficult to perform activities such as putting on a coat without becoming breathless. She has not been able to follow up with a pulmonologist recently.  She follows up with cardiology. She takes spironolactone  12.5 mg daily, torsemide  20 mg daily, and carvedilol  3.125 mg twice a  day. She also has atrial fibrillation and takes Eliquis  2.5 mg twice a day. She monitors her weight daily to manage fluid retention and adjust her diuretic use accordingly.  She has hypertension, managed with losartan  25 mg daily and carvedilol . She experiences neuropathy, for which she takes gabapentin  100 mg at bedtime. The neuropathy is present in her feet and hands, and she also uses gabapentin  for back pain, which is currently not severe.  She is allergic to aspirin and avoids it due to her use of Eliquis .       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   Right lower lobectomy   Diverticulitis 1999   Femur fracture (HCC) 03/2013   left, non weight bearing for 2 1/2 weeks   GERD (gastroesophageal reflux disease)    Hemorrhoids 1991   HTN (hypertension)    Lymphedema of upper extremity    right arm   Osteoporosis    PONV (postoperative nausea and vomiting)    Urinary, incontinence, stress female    Vitamin D  deficiency disease     Past Surgical History:  Procedure Laterality Date   APPENDECTOMY     BREAST BIOPSY  05/04/1987   Right Dr Neysa   BREAST IMPLANT REMOVAL Right 06/05/2010   BUNIONECTOMY  10/15/1986   carcinoid Right    Right lower lobectomy 2017   COLONOSCOPY     COLONOSCOPY W/ POLYPECTOMY     EYE SURGERY  5/14 ; 6/14   Cataract Surgery   JOINT REPLACEMENT     2002 left; 2009 right Hip, rt knee 2012   KNEE ARTHROPLASTY Right 10/15/2010   Darryle Long   LOBECTOMY Right 01/05/2016   Procedure: RIGHT LOWER LOBE LOBECTOMY;  Surgeon: Elspeth JAYSON Millers, MD;  Location: [facility] OR;  Service: Thoracic;  Laterality: Right;   MASTECTOMY PARTIAL / LUMPECTOMY W/ AXILLARY LYMPHADENECTOMY  04/05/1987   Right - Dr Neysa   RESECTION OF MEDIASTINAL MASS N/A 01/05/2016   Procedure:  RESECTION OF PLEURAL MASS, right;  Surgeon: Elspeth JAYSON Millers, MD;  Location: [facility] OR;  Service: Thoracic;  Laterality: N/A;   TONSILLECTOMY     TOTAL HIP ARTHROPLASTY     TUBAL LIGATION Bilateral 10/15/1968   VAGINAL HYSTERECTOMY  10/16/1975   myomata   VIDEO ASSISTED THORACOSCOPY (VATS)/WEDGE RESECTION Right 01/05/2016   Procedure: RIGHT VIDEO ASSISTED THORACOSCOPY (VATS)/WEDGE RESECTION;  Surgeon: Elspeth JAYSON Millers, MD;  Location: [facility] OR;  Service: Thoracic;  Laterality: Right;    Allergies[1]  Outpatient Encounter Medications as of 11/06/2024  Medication Sig   acetaminophen  (TYLENOL ) 500 MG tablet Take 2 tablets (1,000 mg total) by mouth every 6 (six) hours as needed for mild pain.   Calcium  Carbonate-Vitamin D  600-400 MG-UNIT tablet Take 1 tablet by mouth daily.   carvedilol  (COREG ) 3.125 MG tablet Take 1 tablet (3.125 mg total) by mouth 2 (two) times daily with a meal.   ELIQUIS  2.5 MG TABS tablet Take 1 tablet (2.5 mg total) by mouth 2 (two) times daily.   gabapentin  (NEURONTIN ) 100 MG capsule Take 1 capsule (100 mg total) by mouth at bedtime.   lactose free nutrition (BOOST) LIQD Take 237 mLs by mouth daily.   losartan  (COZAAR ) 25 MG tablet Take 1 tablet (25 mg total) by mouth daily.   Omega-3 Fatty Acids (FISH OIL) 1000 MG CAPS Take 1 capsule by mouth daily.   OXYGEN Inhale 2 L into the lungs at bedtime.   pantoprazole  (PROTONIX ) 40 MG tablet TAKE ONE TABLET BY MOUTH DAILY   polyethylene glycol (MIRALAX  / GLYCOLAX ) 17 g packet Take 17 g by mouth daily as needed for moderate constipation.   Probiotic Product (ALIGN PO) Take by mouth.   Propylene Glycol (SYSTANE COMPLETE) 0.6 % SOLN Place 1 drop into both eyes daily.   rosuvastatin  (CRESTOR ) 10 MG tablet Take 1 tablet (10 mg total) by mouth at bedtime.   simethicone  (GAS-X) 80 MG chewable tablet Chew 1 tablet (80 mg total) by mouth every 6 (six) hours as needed for flatulence.   spironolactone  (ALDACTONE ) 25 MG tablet TAKE 1/2  TABLET BY MOUTH DAILY   torsemide  (DEMADEX ) 20 MG tablet TAKE ONE TABLET BY MOUTH DAILY   No facility-administered encounter medications on file as of 11/06/2024.    Review of Systems:  Review of Systems  Constitutional:  Negative for appetite change, chills, fatigue and fever.  HENT:  Negative for congestion, hearing loss, rhinorrhea and sore throat.   Eyes: Negative.   Respiratory:  Positive for shortness of breath. Negative for cough and wheezing.   Cardiovascular:  Negative for chest pain, palpitations and leg swelling.  Gastrointestinal:  Negative for abdominal pain, constipation, diarrhea, nausea and vomiting.  Genitourinary:  Positive for dysuria, frequency and urgency.  Musculoskeletal:  Negative for arthralgias, back pain and myalgias.  Skin:  Negative for color change, rash and wound.  Neurological:  Negative for dizziness, weakness and headaches.  Psychiatric/Behavioral:  Negative for behavioral problems. The patient is not nervous/anxious.     Health Maintenance  Topic Date Due   Zoster Vaccines- Shingrix (2 of 2) 06/19/2021   Mammogram  01/19/2023   Medicare Annual Wellness (AWV)  02/14/2024   Pneumococcal Vaccine: 50+ Years (2 of 2 - PCV) 04/24/2025 (Originally 10/23/2015)   COVID-19 Vaccine (4 - 2025-26 season) 06/14/2025 (Originally 06/15/2024)   Influenza Vaccine  Completed   Bone Density Scan  Completed   Meningococcal B Vaccine  Aged Out   DTaP/Tdap/Td  Discontinued    Physical Exam: Vitals:   11/06/24 1328  BP: 128/62  Pulse: (!) 56  Temp: (!) 97.3 F (36.3 C)  TempSrc: Temporal  SpO2: 96%  Weight: 124 lb 6.4 oz (56.4 kg)  Height: 4' 11 (1.499 m)   Body mass index is 25.13 kg/m. Physical Exam Constitutional:      Appearance: Normal appearance.  HENT:     Head: Normocephalic and atraumatic.     Nose: Nose normal.     Mouth/Throat:     Mouth: Mucous membranes are moist.  Eyes:     Conjunctiva/sclera: Conjunctivae normal.  Cardiovascular:      Rate and Rhythm: Normal rate and regular rhythm.  Pulmonary:     Effort: Pulmonary effort is normal.     Breath sounds: Normal breath sounds.  Abdominal:     General: Bowel sounds are normal.     Palpations: Abdomen is soft.  Musculoskeletal:        General: Normal range of motion.     Cervical back: Normal range of motion.     Right lower leg: Edema present.     Left lower leg: Edema present.     Comments: Bilateral lower extremity 1-2+edema  Skin:    General: Skin is warm and dry.  Neurological:     General: No focal deficit present.     Mental Status: She is alert and oriented to person, place, and time.  Psychiatric:        Mood and Affect: Mood normal.        Behavior: Behavior normal.        Thought Content: Thought content normal.        Judgment: Judgment normal.     Labs reviewed: Basic Metabolic Panel: Recent Labs    06/05/24 1041 09/24/24 1839 10/01/24 0710  NA 134* 137 137  K 4.0 4.2 5.0  CL 97* 95* 95*  CO2 27 34* 34*  GLUCOSE 100* 98 93  BUN 13 23 27*  CREATININE 0.88 1.10* 1.10*  CALCIUM  9.2 9.6 9.5   Liver Function Tests: Recent Labs    11/12/23 0723 06/05/24 1041 10/01/24 0710  AST 25 26 24   ALT 13 11 12   ALKPHOS  --  53  --   BILITOT 1.2 1.5* 1.1  PROT 6.8 6.5 6.7  ALBUMIN  --  3.8  --    Recent Labs    06/05/24 1041  LIPASE 43   No results for input(s): AMMONIA in the last 8760 hours. CBC: Recent Labs    06/05/24 1041 09/24/24 1839 10/20/24 0712  WBC 3.9* 3.9* 4.4  NEUTROABS 3.2 2.8 3,441  HGB 11.3* 11.0* 11.5*  HCT 36.2 34.6* 35.8*  MCV 102.3* 103.0* 98.6  PLT 138* 135* 157   Lipid Panel: Recent Labs    10/01/24 0710  CHOL 123  HDL 61  LDLCALC 49  TRIG 58  CHOLHDL 2.0   No results found for: HGBA1C  Procedures since last visit: No results found.  Assessment/Plan     Labs/tests ordered:   POC urine dipstick urinalysis, urine culture   No follow-ups on file.  Tinsley Lomas Medina-Vargas, NP     [1]   Allergies Allergen Reactions   Aspirin Rash   "

## 2024-11-07 LAB — URINE CULTURE
MICRO NUMBER:: 17510076
Result:: NO GROWTH
SPECIMEN QUALITY:: ADEQUATE

## 2024-11-08 ENCOUNTER — Ambulatory Visit: Payer: Self-pay | Admitting: Adult Health

## 2024-11-08 NOTE — Progress Notes (Signed)
-    urine culture negative for UTI

## 2024-11-09 ENCOUNTER — Other Ambulatory Visit: Payer: Self-pay | Admitting: Cardiovascular Disease

## 2024-11-11 ENCOUNTER — Telehealth: Payer: Self-pay

## 2024-11-11 NOTE — Telephone Encounter (Unsigned)
 Copied from CRM [id number]. Topic: Referral - Question >> Nov 11, 2024  2:47 PM [name] wrote: Reason for CRM: Patient's daughter, [name], called stating that her mom is wanting to go back and see a kidney doctor that she was seeing about 3 years ago. [name] states she called [employer] and they told her that her mom would need a referral from pcp. Offered appt but states her mom will see Man X Mast tomorrow, 11/12/24, and she will have her to speak with her about it.

## 2024-11-12 ENCOUNTER — Ambulatory Visit: Payer: Self-pay | Admitting: Nurse Practitioner

## 2024-11-12 ENCOUNTER — Encounter: Payer: Self-pay | Admitting: Nurse Practitioner

## 2024-11-12 ENCOUNTER — Encounter: Payer: Self-pay | Admitting: Cardiovascular Disease

## 2024-11-12 VITALS — BP 124/68 | HR 78 | Temp 97.8°F | Resp 17 | Ht 59.0 in | Wt 123.2 lb

## 2024-11-12 DIAGNOSIS — Z0000 Encounter for general adult medical examination without abnormal findings: Secondary | ICD-10-CM | POA: Diagnosis not present

## 2024-11-12 NOTE — Progress Notes (Signed)
 "    Chief Complaint  Patient presents with   Medicare Wellness    Annual Wellness Visit.     Subjective:   [name] is a 89 y.o. female who presents for a Medicare Annual Wellness Visit in [facility]  Visit info / Clinical Intake: Medicare Wellness Visit Type:: Subsequent Annual Wellness Visit Persons participating in visit and providing information:: patient Medicare Wellness Visit Mode:: In-person (required for WTM) Interpreter Needed?: No Pre-visit prep was completed: yes AWV questionnaire completed by patient prior to visit?: no Living arrangements:: in retirement community Patient's Overall Health Status Rating: good Typical amount of pain: some Does pain affect daily life?: (!) yes (sitting in chair with heating pad, taking Tylenol , less active.) Are you currently prescribed opioids?: no  Dietary Habits and Nutritional Risks How many meals a day?: 3 Eats fruit and vegetables daily?: yes Most meals are obtained by: having others provide food In the last 2 weeks, have you had any of the following?: none Diabetic:: no  Functional Status Activities of Daily Living (to include ambulation/medication): Independent Ambulation: Independent with device- listed below Home Assistive Devices/Equipment: Walker (specify Type) Medication Administration: Independent Home Management (perform basic housework or laundry): Independent Manage your own finances?: yes Primary transportation is: facility / other Concerns about vision?: no *vision screening is required for WTM* Concerns about hearing?: -- (hearing aids, plan to get new pair)  Fall Screening Falls in the past year?: 1 Number of falls in past year: 0 Was there an injury with Fall?: 1 (skin tear, periorbital contusion) Fall Risk Category Calculator: 2 Patient Fall Risk Level: Moderate Fall Risk  Fall Risk Patient at Risk for Falls Due to: History of fall(s); Impaired balance/gait; Impaired mobility Fall risk Follow  up: Falls evaluation completed; Education provided; Falls prevention discussed  Home and Transportation Safety: All rugs have non-skid backing?: yes All stairs or steps have railings?: yes Grab bars in the bathtub or shower?: yes Have non-skid surface in bathtub or shower?: yes Good home lighting?: yes Regular seat belt use?: yes  Cognitive Assessment Difficulty concentrating, remembering, or making decisions? : no Will 6CIT or Mini Cog be Completed: yes What year is it?: 0 points What month is it?: 0 points Give patient an address phrase to remember (5 components): [address] [facility] [place]  About what time is it?: 0 points Count backwards from 20 to 1: 0 points Say the months of the year in reverse: 0 points Repeat the address phrase from earlier: 2 points 6 CIT Score: 2 points  Advance Directives (For Healthcare) Does Patient Have a Medical Advance Directive?: Yes Does patient want to make changes to medical advance directive?: No - Patient declined Type of Advance Directive: Healthcare Power of [place]; Living will Copy of Healthcare Power of Attorney in Chart?: Yes - validated most recent copy scanned in chart (See row information) Copy of Living Will in Chart?: Yes - validated most recent copy scanned in chart (See row information) Would patient like information on creating a medical advance directive?: No - Patient declined  Reviewed/Updated  Reviewed/Updated: Reviewed All (Medical, Surgical, Family, Medications, Allergies, Care Teams, Patient Goals)    Allergies (verified) Aspirin   Current Medications (verified) Outpatient Encounter Medications as of 11/12/2024  Medication Sig   acetaminophen  (TYLENOL ) 500 MG tablet Take 2 tablets (1,000 mg total) by mouth every 6 (six) hours as needed for mild pain.   Calcium  Carbonate-Vitamin D  600-400 MG-UNIT tablet Take 1 tablet by mouth daily.   carvedilol  (  COREG ) 3.125 MG tablet Take 1 tablet (3.125 mg total) by mouth  2 (two) times daily with a meal.   ELIQUIS  2.5 MG TABS tablet Take 1 tablet (2.5 mg total) by mouth 2 (two) times daily.   gabapentin  (NEURONTIN ) 100 MG capsule Take 1 capsule (100 mg total) by mouth at bedtime.   lactose free nutrition (BOOST) LIQD Take 237 mLs by mouth daily.   losartan  (COZAAR ) 25 MG tablet Take 1 tablet (25 mg total) by mouth daily.   Omega-3 Fatty Acids (FISH OIL) 1000 MG CAPS Take 1 capsule by mouth daily.   OXYGEN Inhale 2 L into the lungs at bedtime.   pantoprazole  (PROTONIX ) 40 MG tablet TAKE ONE TABLET BY MOUTH DAILY   polyethylene glycol (MIRALAX  / GLYCOLAX ) 17 g packet Take 17 g by mouth daily as needed for moderate constipation.   Probiotic Product (ALIGN PO) Take by mouth.   Propylene Glycol (SYSTANE COMPLETE) 0.6 % SOLN Place 1 drop into both eyes daily.   rosuvastatin  (CRESTOR ) 10 MG tablet Take 1 tablet (10 mg total) by mouth at bedtime.   simethicone  (GAS-X) 80 MG chewable tablet Chew 1 tablet (80 mg total) by mouth every 6 (six) hours as needed for flatulence.   spironolactone  (ALDACTONE ) 25 MG tablet TAKE 1/2 TABLET BY MOUTH DAILY   torsemide  (DEMADEX ) 20 MG tablet TAKE ONE TABLET BY MOUTH DAILY   No facility-administered encounter medications on file as of 11/12/2024.    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   Right lower lobectomy   Diverticulitis 1999   Femur fracture (HCC) 03/2013   left, non weight bearing for 2 1/2 weeks   GERD (gastroesophageal reflux disease)    Hemorrhoids 1991   HTN (hypertension)    Lymphedema of upper extremity    right arm   Osteoporosis    PONV (postoperative nausea and vomiting)    Urinary, incontinence, stress female    Vitamin D  deficiency disease    Past Surgical History:  Procedure Laterality Date    APPENDECTOMY     BREAST BIOPSY  05/04/1987   Right Dr Neysa   BREAST IMPLANT REMOVAL Right 06/05/2010   BUNIONECTOMY  10/15/1986   carcinoid Right    Right lower lobectomy 2017   COLONOSCOPY     COLONOSCOPY W/ POLYPECTOMY     EYE SURGERY  5/14 ; 6/14   Cataract Surgery   JOINT REPLACEMENT     2002 left; 2009 right Hip, rt knee 2012   KNEE ARTHROPLASTY Right 10/15/2010   Darryle Long   LOBECTOMY Right 01/05/2016   Procedure: RIGHT LOWER LOBE LOBECTOMY;  Surgeon: Elspeth JAYSON Millers, MD;  Location: [facility] OR;  Service: Thoracic;  Laterality: Right;   MASTECTOMY PARTIAL / LUMPECTOMY W/ AXILLARY LYMPHADENECTOMY  04/05/1987   Right - Dr Neysa   RESECTION OF MEDIASTINAL MASS N/A 01/05/2016   Procedure: RESECTION OF PLEURAL MASS, right;  Surgeon: Elspeth JAYSON Millers, MD;  Location: [facility] OR;  Service: Thoracic;  Laterality: N/A;   TONSILLECTOMY     TOTAL HIP ARTHROPLASTY     TUBAL LIGATION Bilateral 10/15/1968   VAGINAL HYSTERECTOMY  10/16/1975   myomata   VIDEO ASSISTED THORACOSCOPY (VATS)/WEDGE RESECTION Right 01/05/2016   Procedure: RIGHT VIDEO ASSISTED THORACOSCOPY (VATS)/WEDGE RESECTION;  Surgeon: Elspeth JAYSON Millers, MD;  Location: [facility] OR;  Service: Thoracic;  Laterality: Right;   Family History  Problem Relation Age of Onset   Colon cancer Mother    Colon cancer Father    Heart disease Father    Stomach cancer Neg Hx    Liver cancer Neg Hx    Esophageal cancer Neg Hx    Social History   Occupational History   Not on file  Tobacco Use   Smoking status: Never   Smokeless tobacco: Never  Vaping Use   Vaping status: Never Used  Substance and Sexual Activity   Alcohol  use: No    Alcohol /week: 0.0 standard drinks of alcohol    Drug use: No   Sexual activity: Not Currently    Partners: Male    Birth control/protection: Surgical    Comment: hysterectomy   Tobacco Counseling Counseling given: Not Answered  SDOH Screenings   Food Insecurity: No Food Insecurity  (11/12/2024)  Housing: Unknown (11/12/2024)  Transportation Needs: No Transportation Needs (11/12/2024)  Utilities: Not At Risk (11/12/2024)  Depression (PHQ2-9): Low Risk (11/12/2024)  Financial Resource Strain: Low Risk (11/05/2024)  Physical Activity: Insufficiently Active (11/12/2024)  Social Connections: Moderately Isolated (11/12/2024)  Stress: No Stress Concern Present (11/12/2024)  Tobacco Use: Low Risk (11/12/2024)   See flowsheets for full screening details  Depression Screen PHQ 2 & 9 Depression Scale- Over the past 2 weeks, how often have you been bothered by any of the following problems? Little interest or pleasure in doing things: 0 Feeling down, depressed, or hopeless (PHQ Adolescent also includes...irritable): 0 PHQ-2 Total Score: 0     Goals Addressed   None          Objective:    Today's Vitals   11/12/24 1316  BP: 124/68  Pulse: 78  Resp: 17  Temp: 97.8 F (36.6 C)  SpO2: 97%  Weight: 123 lb 3.2 oz (55.9 kg)  Height: 4' 11 (1.499 m)   Body mass index is 24.88 kg/m.  Hearing/Vision screen Hearing Screening - Comments:: Some hearing issues pt has hearing aids. Vision Screening - Comments:: Eye exam jan.2026 [facility] ,no issues with eyes. Immunizations and Health Maintenance Health Maintenance  Topic Date Due   Zoster Vaccines- Shingrix (2 of 2) 06/19/2021   Mammogram  01/19/2023   Pneumococcal Vaccine: 50+ Years (2 of 2 - PCV) 04/24/2025 (Originally 10/23/2015)   COVID-19 Vaccine (4 - 2025-26 season) 06/14/2025 (Originally 06/15/2024)   Medicare Annual Wellness (AWV)  11/12/2025   Influenza Vaccine  Completed   Bone Density Scan  Completed   Meningococcal B Vaccine  Aged Out   DTaP/Tdap/Td  Discontinued        Assessment/Plan:  This is a routine wellness examination for [place].  Patient Care Team: Charlanne Fredia CROME, MD as PCP - General (Internal Medicine) O'Neal, Darryle Ned, MD as PCP - Cardiology (Cardiology) Cleotilde Ronal RAMAN, MD  (Gynecology) Melodye Coy, MD (Inactive) (Hematology and Oncology) Celestia Agent, MD (Inactive) (Gastroenterology)  I have personally reviewed and noted the following in the patients chart:   Medical and social history Use of alcohol , tobacco or illicit drugs  Current medications and supplements including opioid prescriptions. Functional ability and status Nutritional status Physical activity Advanced directives List of other physicians Hospitalizations, surgeries, and ER visits in previous 12 months Vitals Screenings to include cognitive, depression, and falls Referrals and appointments  Orders Placed This Encounter  Procedures   Ambulatory referral to Nephrology    Referral Priority:  Routine    Referral Type:   Consultation    Referral Reason:   Specialty Services Required    Referred to Provider:   Dolan Mateo Larger, MD    Requested Specialty:   Nephrology    Number of Visits Requested:   1   In addition, I have reviewed and discussed with patient certain preventive protocols, quality metrics, and best practice recommendations. A written personalized care plan for preventive services as well as general preventive health recommendations were provided to patient.   Jeily Guthridge X Terren Jandreau, NP   11/12/2024   Return in 1 year (on 11/12/2025).  After Visit Summary: (In Person-Printed) AVS printed and given to the patient  "

## 2024-11-12 NOTE — Patient Instructions (Signed)
F/u in clinic 6 months.  

## 2024-11-13 ENCOUNTER — Telehealth: Payer: Self-pay | Admitting: Cardiovascular Disease

## 2024-11-13 NOTE — Telephone Encounter (Signed)
 Pt states she will be out of medication by next Tuesday. Please advise.

## 2024-11-16 NOTE — Telephone Encounter (Signed)
 Refill was sent 11/13/24.

## 2024-11-25 ENCOUNTER — Ambulatory Visit: Admitting: Internal Medicine

## 2025-05-13 ENCOUNTER — Encounter: Admitting: Nurse Practitioner
